# Patient Record
Sex: Male | Born: 1965 | Race: Black or African American | Marital: Married | Smoking: Current every day smoker
Health system: Southern US, Community
[De-identification: ages and names within clinical notes are randomized; demographics above are authoritative.]

## PROBLEM LIST (undated history)

## (undated) DIAGNOSIS — B20 Human immunodeficiency virus [HIV] disease: Secondary | ICD-10-CM

## (undated) DIAGNOSIS — J449 Chronic obstructive pulmonary disease, unspecified: Secondary | ICD-10-CM

## (undated) DIAGNOSIS — A630 Anogenital (venereal) warts: Secondary | ICD-10-CM

## (undated) DIAGNOSIS — M810 Age-related osteoporosis without current pathological fracture: Secondary | ICD-10-CM

---

## 2015-03-20 DIAGNOSIS — B20 Human immunodeficiency virus [HIV] disease: Secondary | ICD-10-CM | POA: Diagnosis not present

## 2015-04-11 DIAGNOSIS — B20 Human immunodeficiency virus [HIV] disease: Secondary | ICD-10-CM | POA: Diagnosis not present

## 2015-04-30 ENCOUNTER — Other Ambulatory Visit: Payer: Self-pay | Admitting: Infectious Disease

## 2015-05-18 ENCOUNTER — Other Ambulatory Visit: Payer: Self-pay | Admitting: Internal Medicine

## 2015-05-22 ENCOUNTER — Encounter: Payer: Self-pay | Admitting: Infectious Disease

## 2015-06-06 DIAGNOSIS — B20 Human immunodeficiency virus [HIV] disease: Secondary | ICD-10-CM | POA: Diagnosis not present

## 2015-07-30 DIAGNOSIS — B20 Human immunodeficiency virus [HIV] disease: Secondary | ICD-10-CM | POA: Diagnosis not present

## 2015-12-08 DIAGNOSIS — B20 Human immunodeficiency virus [HIV] disease: Secondary | ICD-10-CM

## 2016-03-02 ENCOUNTER — Other Ambulatory Visit: Payer: Self-pay | Admitting: Internal Medicine

## 2016-03-27 ENCOUNTER — Other Ambulatory Visit: Payer: Self-pay | Admitting: Infectious Disease

## 2016-04-30 ENCOUNTER — Other Ambulatory Visit: Payer: Self-pay | Admitting: Infectious Disease

## 2016-05-12 DIAGNOSIS — J9601 Acute respiratory failure with hypoxia: Secondary | ICD-10-CM

## 2016-05-12 DIAGNOSIS — J189 Pneumonia, unspecified organism: Secondary | ICD-10-CM

## 2016-05-12 DIAGNOSIS — A419 Sepsis, unspecified organism: Secondary | ICD-10-CM | POA: Diagnosis not present

## 2016-05-12 DIAGNOSIS — B20 Human immunodeficiency virus [HIV] disease: Secondary | ICD-10-CM

## 2016-05-12 DIAGNOSIS — N183 Chronic kidney disease, stage 3 (moderate): Secondary | ICD-10-CM | POA: Diagnosis not present

## 2016-05-13 DIAGNOSIS — J189 Pneumonia, unspecified organism: Secondary | ICD-10-CM | POA: Diagnosis not present

## 2016-05-13 DIAGNOSIS — A419 Sepsis, unspecified organism: Secondary | ICD-10-CM | POA: Diagnosis not present

## 2016-05-13 DIAGNOSIS — N183 Chronic kidney disease, stage 3 (moderate): Secondary | ICD-10-CM | POA: Diagnosis not present

## 2016-05-13 DIAGNOSIS — J9601 Acute respiratory failure with hypoxia: Secondary | ICD-10-CM | POA: Diagnosis not present

## 2016-05-13 DIAGNOSIS — N492 Inflammatory disorders of scrotum: Secondary | ICD-10-CM

## 2016-05-14 DIAGNOSIS — B20 Human immunodeficiency virus [HIV] disease: Secondary | ICD-10-CM

## 2016-05-14 DIAGNOSIS — A630 Anogenital (venereal) warts: Secondary | ICD-10-CM

## 2016-05-14 DIAGNOSIS — J449 Chronic obstructive pulmonary disease, unspecified: Secondary | ICD-10-CM

## 2016-05-14 DIAGNOSIS — J9601 Acute respiratory failure with hypoxia: Secondary | ICD-10-CM | POA: Diagnosis not present

## 2016-05-14 DIAGNOSIS — J189 Pneumonia, unspecified organism: Secondary | ICD-10-CM

## 2016-05-14 DIAGNOSIS — N492 Inflammatory disorders of scrotum: Secondary | ICD-10-CM

## 2016-05-15 DIAGNOSIS — A630 Anogenital (venereal) warts: Secondary | ICD-10-CM | POA: Diagnosis not present

## 2016-05-15 DIAGNOSIS — B20 Human immunodeficiency virus [HIV] disease: Secondary | ICD-10-CM | POA: Diagnosis not present

## 2016-05-15 DIAGNOSIS — G8929 Other chronic pain: Secondary | ICD-10-CM

## 2016-05-15 DIAGNOSIS — J9601 Acute respiratory failure with hypoxia: Secondary | ICD-10-CM | POA: Diagnosis not present

## 2016-05-15 DIAGNOSIS — J189 Pneumonia, unspecified organism: Secondary | ICD-10-CM | POA: Diagnosis not present

## 2016-09-01 ENCOUNTER — Telehealth: Payer: Self-pay | Admitting: Infectious Disease

## 2016-09-01 NOTE — Telephone Encounter (Signed)
Genotype faxed to Chrissy at [facility], confirmation received.  Lawrence MolaJacqueline Joscelynn Kane

## 2016-09-01 NOTE — Telephone Encounter (Signed)
Patients GENOSURE MG resistance testing back:       These results need to be sent to [facility] since he is our clinic patient there

## 2016-09-03 DIAGNOSIS — B20 Human immunodeficiency virus [HIV] disease: Secondary | ICD-10-CM | POA: Diagnosis not present

## 2016-11-06 ENCOUNTER — Emergency Department (HOSPITAL_COMMUNITY)
Admission: EM | Admit: 2016-11-06 | Discharge: 2016-11-06 | Disposition: A | Discharge status: Home / Self Care | Payer: Medicaid Other | Attending: Emergency Medicine | Admitting: Emergency Medicine

## 2016-11-06 ENCOUNTER — Encounter (HOSPITAL_COMMUNITY): Payer: Self-pay

## 2016-11-06 DIAGNOSIS — F17200 Nicotine dependence, unspecified, uncomplicated: Secondary | ICD-10-CM | POA: Insufficient documentation

## 2016-11-06 DIAGNOSIS — L03315 Cellulitis of perineum: Secondary | ICD-10-CM | POA: Diagnosis not present

## 2016-11-06 DIAGNOSIS — A630 Anogenital (venereal) warts: Secondary | ICD-10-CM

## 2016-11-06 DIAGNOSIS — L0292 Furuncle, unspecified: Secondary | ICD-10-CM | POA: Diagnosis present

## 2016-11-06 HISTORY — DX: Anogenital (venereal) warts: A63.0

## 2016-11-06 HISTORY — DX: Human immunodeficiency virus (HIV) disease: B20

## 2016-11-06 MED ORDER — SULFAMETHOXAZOLE-TRIMETHOPRIM 800-160 MG PO TABS: 1.0000 | ORAL_TABLET | Freq: Two times a day (BID) | ORAL | 0 refills | Status: AC

## 2016-11-06 MED ORDER — CEPHALEXIN 500 MG PO CAPS: 500.0000 mg | ORAL_CAPSULE | Freq: Four times a day (QID) | ORAL | 0 refills | Status: DC

## 2016-11-06 NOTE — ED Triage Notes (Signed)
Pt brought in [facility] for scrotal boil and warts rupture at testicles. Pt has hx of genital warts.

## 2016-11-06 NOTE — ED Provider Notes (Signed)
[facility]-EMERGENCY DEPT Provider Note   CSN: [id number] Arrival date & time: 11/06/16  1536     History   Chief Complaint Chief Complaint  Patient presents with  . Genital Warts    HPI Lawrence Kane is a 50 y.o. male.  HPI Patient presents with reported boils in his groin. States it is been there for the last month. States that they have started to bleed and rupture. States he's had them before. No fevers. States his joints are acting up. States he is pretty healthy review of some of the records shows that he has HIV and some questionable compliance. Has a history of genital warts. Denies fever. States he feels so bad he had to call the ambulance.   Past Medical History:  Diagnosis Date  . Genital warts   . HIV (human immunodeficiency virus infection) (HCC)     There are no active problems to display for this patient.   History reviewed. No pertinent surgical history.     Home Medications    Prior to Admission medications   Medication Sig Start Date End Date Taking? Authorizing Provider  cephALEXin (KEFLEX) 500 MG capsule Take 1 capsule (500 mg total) by mouth 4 (four) times daily. 11/06/16   Benjiman CoreNathan Amel Gianino, MD  sulfamethoxazole-trimethoprim (BACTRIM DS,SEPTRA DS) 800-160 MG tablet Take 1 tablet by mouth 2 (two) times daily. 11/06/16 11/13/16  Benjiman CoreNathan Sarath Privott, MD    Family History History reviewed. No pertinent family history.  Social History Social History  Substance Use Topics  . Smoking status: Current Every Day Smoker  . Smokeless tobacco: Never Used  . Alcohol use 0.6 oz/week    1 Cans of beer per week     Allergies   Iodine   Review of Systems Review of Systems  Constitutional: Negative for appetite change.  HENT: Negative for congestion.   Respiratory: Negative for shortness of breath.   Gastrointestinal: Negative for abdominal pain.  Genitourinary: Negative for flank pain.  Musculoskeletal: Positive for joint swelling.  Skin:       "Boils"   Neurological: Negative for headaches.  Hematological: Negative for adenopathy.     Physical Exam Updated Vital Signs BP 139/70 (BP Location: Right Arm)   Pulse 77   Temp 98.3 F (36.8 C) (Oral)   Resp 16   Ht 6\' 4"  (1.93 m)   Wt 240 lb (108.9 kg)   SpO2 99%   BMI 29.21 kg/m   Physical Exam  Constitutional: He appears well-developed.  HENT:  Head: Atraumatic.  Eyes:  Some discoloration right cornea and redness of his sclera.  Neck: Neck supple.  Cardiovascular: Normal rate.   Pulmonary/Chest: Effort normal.  Abdominal: Soft. There is no tenderness.  Genitourinary:  Genitourinary Comments: Patient has large amounts of confluent general warts on his scrotum and perineal area and up on his penis. There is likely an infection on without a clear abscess. Some purulence but not a clear draining site. No fluctuance.  Musculoskeletal: He exhibits no edema.  Skin: Skin is warm.     ED Treatments / Results  Labs (all labs ordered are listed, but only abnormal results are displayed) Labs Reviewed - No data to display  EKG  EKG Interpretation None       Radiology No results found.  Procedures Procedures (including critical care time)  Medications Ordered in ED Medications - No data to display   Initial Impression / Assessment and Plan / ED Course  I have reviewed the triage vital signs and  the nursing notes.  Pertinent labs & imaging results that were available during my care of the patient were reviewed by me and considered in my medical decision making (see chart for details).  Clinical Course   Patient with severe genital warts and likely infection on top of it. Has been on antibiotics the past. States that vancomycin is work. I think at this point he would benefit from admission however patient is not willing to stay. He states he does need some Dilaudid to help with his joints so. Patient was informed she will not get IV Dilaudid for joint pain. States he just  wants a PICC line now with vancomycin to go since he has to [place] to [place]. States he can come back on Tuesday. Patient was informed of the need for admission and the chance of him getting worse that he likely needs inpatient treatment. He was not willing to stay. He asked me for the PICC line asked me to be able to change his train take it. I informed him that I have no means to change a train ticket. He is not willing to be admitted and will leave AGAINST MEDICAL ADVICE. Final Clinical Impressions(s) / ED Diagnoses   Final diagnoses:  Anogenital warts  Cellulitis of perineum    New Prescriptions New Prescriptions   CEPHALEXIN (KEFLEX) 500 MG CAPSULE    Take 1 capsule (500 mg total) by mouth 4 (four) times daily.   SULFAMETHOXAZOLE-TRIMETHOPRIM (BACTRIM DS,SEPTRA DS) 800-160 MG TABLET    Take 1 tablet by mouth 2 (two) times daily.     Benjiman CoreNathan Tyee Vandevoorde, MD 11/06/16 [id number]

## 2016-11-06 NOTE — Discharge Instructions (Signed)
Exam of your prior to her leaving against our advice. I think you would benefit from admission to the hospital. Feel free to return for further treatment. Follow-up with your doctors her back in the ER as soon as possible.

## 2016-11-17 HISTORY — PX: PROSTATE SURGERY: SHX751

## 2016-11-28 ENCOUNTER — Encounter (HOSPITAL_COMMUNITY): Payer: Self-pay | Admitting: Emergency Medicine

## 2016-11-28 ENCOUNTER — Emergency Department (HOSPITAL_COMMUNITY)
Admission: EM | Admit: 2016-11-28 | Discharge: 2016-11-29 | Disposition: A | Discharge status: Home / Self Care | Payer: Medicaid Other | Attending: Emergency Medicine | Admitting: Emergency Medicine

## 2016-11-28 DIAGNOSIS — Y69 Unspecified misadventure during surgical and medical care: Secondary | ICD-10-CM | POA: Insufficient documentation

## 2016-11-28 DIAGNOSIS — F17200 Nicotine dependence, unspecified, uncomplicated: Secondary | ICD-10-CM | POA: Insufficient documentation

## 2016-11-28 DIAGNOSIS — T814XXA Infection following a procedure, initial encounter: Secondary | ICD-10-CM | POA: Diagnosis present

## 2016-11-28 DIAGNOSIS — J449 Chronic obstructive pulmonary disease, unspecified: Secondary | ICD-10-CM | POA: Diagnosis not present

## 2016-11-28 DIAGNOSIS — T8131XA Disruption of external operation (surgical) wound, not elsewhere classified, initial encounter: Secondary | ICD-10-CM | POA: Insufficient documentation

## 2016-11-28 DIAGNOSIS — T8130XA Disruption of wound, unspecified, initial encounter: Secondary | ICD-10-CM

## 2016-11-28 HISTORY — DX: Chronic obstructive pulmonary disease, unspecified: J44.9

## 2016-11-28 HISTORY — DX: Age-related osteoporosis without current pathological fracture: M81.0

## 2016-11-28 LAB — CBC WITH DIFFERENTIAL/PLATELET
BASOS ABS: 0 10*3/uL (ref 0.0–0.1)
Basophils Relative: 0 %
EOS PCT: 5 %
Eosinophils Absolute: 0.5 10*3/uL (ref 0.0–0.7)
HEMATOCRIT: 27.4 % — AB (ref 39.0–52.0)
HEMOGLOBIN: 9.1 g/dL — AB (ref 13.0–17.0)
LYMPHS PCT: 24 %
Lymphs Abs: 2.6 10*3/uL (ref 0.7–4.0)
MCH: 31.8 pg (ref 26.0–34.0)
MCHC: 33.2 g/dL (ref 30.0–36.0)
MCV: 95.8 fL (ref 78.0–100.0)
Monocytes Absolute: 0.9 10*3/uL (ref 0.1–1.0)
Monocytes Relative: 8 %
NEUTROS ABS: 7 10*3/uL (ref 1.7–7.7)
NEUTROS PCT: 63 %
PLATELETS: 260 10*3/uL (ref 150–400)
RBC: 2.86 MIL/uL — AB (ref 4.22–5.81)
RDW: 17.2 % — ABNORMAL HIGH (ref 11.5–15.5)
WBC: 10.9 10*3/uL — AB (ref 4.0–10.5)

## 2016-11-28 LAB — BASIC METABOLIC PANEL
ANION GAP: 6 (ref 5–15)
BUN: 41 mg/dL — ABNORMAL HIGH (ref 6–20)
CHLORIDE: 105 mmol/L (ref 101–111)
CO2: 24 mmol/L (ref 22–32)
Calcium: 8.7 mg/dL — ABNORMAL LOW (ref 8.9–10.3)
Creatinine, Ser: 1.74 mg/dL — ABNORMAL HIGH (ref 0.61–1.24)
GFR calc Af Amer: 51 mL/min — ABNORMAL LOW (ref >60)
GFR, EST NON AFRICAN AMERICAN: 44 mL/min — AB (ref >60)
GLUCOSE: 250 mg/dL — AB (ref 65–99)
POTASSIUM: 4.6 mmol/L (ref 3.5–5.1)
Sodium: 135 mmol/L (ref 135–145)

## 2016-11-28 MED ORDER — DOXYCYCLINE HYCLATE 100 MG PO CAPS: 100.0000 mg | ORAL_CAPSULE | Freq: Two times a day (BID) | ORAL | 0 refills | Status: DC

## 2016-11-28 MED ORDER — OXYCODONE-ACETAMINOPHEN 5-325 MG PO TABS
1.0000 | ORAL_TABLET | Freq: Once | ORAL | Status: AC
  Administered 2016-11-28: 1 via ORAL
  Filled 2016-11-28: qty 1

## 2016-11-28 NOTE — ED Notes (Signed)
Bed: WA02 Expected date:  Expected time:  Means of arrival:  Comments: Ems- post op bleeding

## 2016-11-28 NOTE — ED Triage Notes (Signed)
Per EMS pt had procedure on his prostate on the 10th. Pt received 4 units of blood on the 13th due to post-op bleeding complications. Pt is not on blood thinners. Pt has Hx of HIV, Hep B and Hep C.

## 2016-11-28 NOTE — ED Provider Notes (Signed)
[facility]-EMERGENCY DEPT Provider Note   CSN: [id number] Arrival date & time: 11/28/16  2022     History   Chief Complaint Chief Complaint  Patient presents with  . Post-op Problem    HPI Lawrence Kane is a 51 y.o. male.  HPI Patient presented to the emergency room on December 30 with complaints of general warts and cutaneous infections in the scrotal region.  Admission to the hospital was recommended. Patient opted to leave. Patient states he then traveled outside the state. He ended up getting care at another facility. He had a surgical procedure in the scrotal region. Patient left the hospital and came back to [place]. I'm not sure if he was discharged or left on his own. He comes to the emergency room now because he has having continued drainage from the wound. He also noticed some blood.  He denies any trouble with any fevers or chills. No vomiting or diarrhea. Past Medical History:  Diagnosis Date  . COPD (chronic obstructive pulmonary disease) (HCC)   . Genital warts   . HIV (human immunodeficiency virus infection) (HCC)   . Osteoporosis     There are no active problems to display for this patient.   Past Surgical History:  Procedure Laterality Date  . PROSTATE SURGERY  11/17/2016       Home Medications    Prior to Admission medications   Medication Sig Start Date End Date Taking? Authorizing Provider  azithromycin (ZITHROMAX) 600 MG tablet Take 600 mg by mouth 3 (three) times a week. 10/28/16  Yes Historical Provider, MD  clindamycin (CLEOCIN) 150 MG capsule Take 300 mg by mouth 4 (four) times daily.   Yes Historical Provider, MD  diphenhydrAMINE (SOMINEX) 25 MG tablet Take 25 mg by mouth every 6 (six) hours as needed for itching.   Yes Historical Provider, MD  fluconazole (DIFLUCAN) 100 MG tablet Take 100 mg by mouth daily.   Yes Historical Provider, MD  gabapentin (NEURONTIN) 600 MG tablet Take 600 mg by mouth 3 (three) times daily.   Yes Historical Provider,  MD  GENVOYA 150-150-200-10 MG TABS tablet Take 1 tablet by mouth daily. 10/27/16  Yes Historical Provider, MD  ipratropium-albuterol (DUONEB) 0.5-2.5 (3) MG/3ML SOLN Take 3 mLs by nebulization every 4 (four) hours as needed (shortness of breathe).   Yes Historical Provider, MD  oxyCODONE-acetaminophen (PERCOCET) 10-325 MG tablet Take 1 tablet by mouth every 6 (six) hours as needed for pain. 11/19/16  Yes Historical Provider, MD  polyvinyl alcohol (LIQUIFILM TEARS) 1.4 % ophthalmic solution Place 1 drop into both eyes daily as needed for dry eyes.   Yes Historical Provider, MD  PREZISTA 800 MG tablet Take 800 mg by mouth daily. 10/29/16  Yes Historical Provider, MD  TRUVADA 200-300 MG tablet Take 1 tablet by mouth daily. 10/26/16  Yes Historical Provider, MD  doxycycline (VIBRAMYCIN) 100 MG capsule Take 1 capsule (100 mg total) by mouth 2 (two) times daily. 11/28/16   Linwood DibblesJon Dania Marsan, MD    Family History No family history on file.  Social History Social History  Substance Use Topics  . Smoking status: Current Every Day Smoker  . Smokeless tobacco: Never Used  . Alcohol use 0.6 oz/week    1 Cans of beer per week     Allergies   Iodine; Ketorolac; and Tylenol [acetaminophen]   Review of Systems Review of Systems  All other systems reviewed and are negative.    Physical Exam Updated Vital Signs BP 132/84 (BP Location: Right  Arm)   Pulse 82   Temp 97.7 F (36.5 C) (Oral)   Resp 18   Ht 6\' 4"  (1.93 m)   Wt 111.1 kg   SpO2 98%   BMI 29.82 kg/m   Physical Exam  Constitutional: He appears well-developed and well-nourished. No distress.  HENT:  Head: Normocephalic and atraumatic.  Right Ear: External ear normal.  Left Ear: External ear normal.  Eyes: Conjunctivae are normal. Right eye exhibits no discharge. Left eye exhibits no discharge. No scleral icterus.  Neck: Neck supple. No tracheal deviation present.  Cardiovascular: Normal rate.   Pulmonary/Chest: Effort normal. No  stridor. No respiratory distress.  Abdominal: He exhibits no distension.  Genitourinary:  Genitourinary Comments: Patient has evidence of genital warts on the penis and the scrotum, he has a large dehisced wound at the base of the scrotum, there is purulent malodorous drainage, the tissue at the base of the wound however is pink there is no active bleeding, the patient has what appears to be a Penrose drain that is still sutured but does not appear to be draining any wound and is external on the skin surface  Musculoskeletal: He exhibits no edema.  Neurological: He is alert. Cranial nerve deficit: no gross deficits.  Skin: Skin is warm and dry. No rash noted.  Psychiatric: He has a normal mood and affect.  Nursing note and vitals reviewed.      ED Treatments / Results  Labs (all labs ordered are listed, but only abnormal results are displayed) Labs Reviewed  CBC WITH DIFFERENTIAL/PLATELET - Abnormal; Notable for the following:       Result Value   WBC 10.9 (*)    RBC 2.86 (*)    Hemoglobin 9.1 (*)    HCT 27.4 (*)    RDW 17.2 (*)    All other components within normal limits  BASIC METABOLIC PANEL - Abnormal; Notable for the following:    Glucose, Bld 250 (*)    BUN 41 (*)    Creatinine, Ser 1.74 (*)    Calcium 8.7 (*)    GFR calc non Af Amer 44 (*)    GFR calc Af Amer 51 (*)    All other components within normal limits     Procedures Procedures (including critical care time) Penrose drain removed.    Medications Ordered in ED Medications  oxyCODONE-acetaminophen (PERCOCET/ROXICET) 5-325 MG per tablet 1 tablet (1 tablet Oral Given 11/28/16 2237)     Initial Impression / Assessment and Plan / ED Course  I have reviewed the triage vital signs and the nursing notes.  Pertinent labs & imaging results that were available during my care of the patient were reviewed by me and considered in my medical decision making (see chart for details).   I review the patient's medical  records. He clearly has history of noncompliance and limited understanding of his medical condition. I suspect that he had this surgical procedure in the other state and then left before he completed his care. Appears to have a large wound dehiscence right now but I do not seen any vascular tissue. I am not sure if he had an incision and drainage versus excision of condyloma versus a combination of both. He is not febrile.  I do not think he has been adequately taking care of this wound regarding hygiene.  Wound was irrigated and cleaned in the ED.   Will dc home on doxycycline.   Discussed sterile dressing and gauze  I discussed the  case with Dr Domenick Bookbinder.  Will arrange close follow up in the urology office.     Final Clinical Impressions(s) / ED Diagnoses   Final diagnoses:  Wound dehiscence    New Prescriptions New Prescriptions   DOXYCYCLINE (VIBRAMYCIN) 100 MG CAPSULE    Take 1 capsule (100 mg total) by mouth 2 (two) times daily.     Linwood Dibbles, MD 11/28/16 [id number]

## 2016-11-28 NOTE — Discharge Instructions (Signed)
Soak in a tub daily, apply sterile gauze to the wound, follow up in the urology office

## 2016-12-01 ENCOUNTER — Inpatient Hospital Stay (HOSPITAL_COMMUNITY)
Admission: AD | Admit: 2016-12-01 | Discharge: 2017-01-04 | DRG: 004 | Disposition: A | Discharge status: Skilled Nursing Facility | Payer: Medicaid Other | Source: Other Acute Inpatient Hospital | Attending: Nephrology | Admitting: Nephrology

## 2016-12-01 ENCOUNTER — Inpatient Hospital Stay (HOSPITAL_COMMUNITY): Payer: Medicaid Other

## 2016-12-01 DIAGNOSIS — E872 Acidosis: Secondary | ICD-10-CM | POA: Diagnosis present

## 2016-12-01 DIAGNOSIS — D721 Eosinophilia: Secondary | ICD-10-CM | POA: Diagnosis present

## 2016-12-01 DIAGNOSIS — Z91041 Radiographic dye allergy status: Secondary | ICD-10-CM

## 2016-12-01 DIAGNOSIS — R451 Restlessness and agitation: Secondary | ICD-10-CM | POA: Diagnosis not present

## 2016-12-01 DIAGNOSIS — G9340 Encephalopathy, unspecified: Secondary | ICD-10-CM | POA: Diagnosis not present

## 2016-12-01 DIAGNOSIS — R4182 Altered mental status, unspecified: Secondary | ICD-10-CM | POA: Diagnosis present

## 2016-12-01 DIAGNOSIS — R1310 Dysphagia, unspecified: Secondary | ICD-10-CM | POA: Diagnosis not present

## 2016-12-01 DIAGNOSIS — M810 Age-related osteoporosis without current pathological fracture: Secondary | ICD-10-CM | POA: Diagnosis present

## 2016-12-01 DIAGNOSIS — G9341 Metabolic encephalopathy: Secondary | ICD-10-CM | POA: Diagnosis present

## 2016-12-01 DIAGNOSIS — Z1612 Extended spectrum beta lactamase (ESBL) resistance: Secondary | ICD-10-CM | POA: Diagnosis not present

## 2016-12-01 DIAGNOSIS — G8250 Quadriplegia, unspecified: Secondary | ICD-10-CM | POA: Diagnosis not present

## 2016-12-01 DIAGNOSIS — Z79891 Long term (current) use of opiate analgesic: Secondary | ICD-10-CM | POA: Diagnosis not present

## 2016-12-01 DIAGNOSIS — M5410 Radiculopathy, site unspecified: Secondary | ICD-10-CM | POA: Diagnosis not present

## 2016-12-01 DIAGNOSIS — R740 Nonspecific elevation of levels of transaminase and lactic acid dehydrogenase [LDH]: Secondary | ICD-10-CM | POA: Diagnosis not present

## 2016-12-01 DIAGNOSIS — F1410 Cocaine abuse, uncomplicated: Secondary | ICD-10-CM

## 2016-12-01 DIAGNOSIS — G0490 Encephalitis and encephalomyelitis, unspecified: Secondary | ICD-10-CM

## 2016-12-01 DIAGNOSIS — J449 Chronic obstructive pulmonary disease, unspecified: Secondary | ICD-10-CM | POA: Diagnosis not present

## 2016-12-01 DIAGNOSIS — Z794 Long term (current) use of insulin: Secondary | ICD-10-CM | POA: Diagnosis not present

## 2016-12-01 DIAGNOSIS — Z7189 Other specified counseling: Secondary | ICD-10-CM | POA: Diagnosis not present

## 2016-12-01 DIAGNOSIS — B181 Chronic viral hepatitis B without delta-agent: Secondary | ICD-10-CM | POA: Diagnosis present

## 2016-12-01 DIAGNOSIS — J810 Acute pulmonary edema: Secondary | ICD-10-CM

## 2016-12-01 DIAGNOSIS — E870 Hyperosmolality and hypernatremia: Secondary | ICD-10-CM | POA: Diagnosis present

## 2016-12-01 DIAGNOSIS — R7881 Bacteremia: Secondary | ICD-10-CM | POA: Diagnosis not present

## 2016-12-01 DIAGNOSIS — I272 Pulmonary hypertension, unspecified: Secondary | ICD-10-CM | POA: Diagnosis present

## 2016-12-01 DIAGNOSIS — Y95 Nosocomial condition: Secondary | ICD-10-CM | POA: Diagnosis not present

## 2016-12-01 DIAGNOSIS — J150 Pneumonia due to Klebsiella pneumoniae: Secondary | ICD-10-CM | POA: Diagnosis present

## 2016-12-01 DIAGNOSIS — E43 Unspecified severe protein-calorie malnutrition: Secondary | ICD-10-CM | POA: Diagnosis not present

## 2016-12-01 DIAGNOSIS — J811 Chronic pulmonary edema: Secondary | ICD-10-CM

## 2016-12-01 DIAGNOSIS — Z79899 Other long term (current) drug therapy: Secondary | ICD-10-CM

## 2016-12-01 DIAGNOSIS — R066 Hiccough: Secondary | ICD-10-CM | POA: Diagnosis not present

## 2016-12-01 DIAGNOSIS — I6340 Cerebral infarction due to embolism of unspecified cerebral artery: Secondary | ICD-10-CM | POA: Insufficient documentation

## 2016-12-01 DIAGNOSIS — A6000 Herpesviral infection of urogenital system, unspecified: Secondary | ICD-10-CM | POA: Diagnosis present

## 2016-12-01 DIAGNOSIS — A419 Sepsis, unspecified organism: Principal | ICD-10-CM

## 2016-12-01 DIAGNOSIS — B004 Herpesviral encephalitis: Secondary | ICD-10-CM | POA: Diagnosis present

## 2016-12-01 DIAGNOSIS — A630 Anogenital (venereal) warts: Secondary | ICD-10-CM

## 2016-12-01 DIAGNOSIS — M6281 Muscle weakness (generalized): Secondary | ICD-10-CM | POA: Diagnosis not present

## 2016-12-01 DIAGNOSIS — K759 Inflammatory liver disease, unspecified: Secondary | ICD-10-CM | POA: Diagnosis not present

## 2016-12-01 DIAGNOSIS — I638 Other cerebral infarction: Secondary | ICD-10-CM | POA: Diagnosis not present

## 2016-12-01 DIAGNOSIS — R6521 Severe sepsis with septic shock: Secondary | ICD-10-CM | POA: Diagnosis present

## 2016-12-01 DIAGNOSIS — E785 Hyperlipidemia, unspecified: Secondary | ICD-10-CM | POA: Diagnosis present

## 2016-12-01 DIAGNOSIS — Z978 Presence of other specified devices: Secondary | ICD-10-CM | POA: Diagnosis not present

## 2016-12-01 DIAGNOSIS — R1312 Dysphagia, oropharyngeal phase: Secondary | ICD-10-CM | POA: Diagnosis present

## 2016-12-01 DIAGNOSIS — D62 Acute posthemorrhagic anemia: Secondary | ICD-10-CM | POA: Diagnosis present

## 2016-12-01 DIAGNOSIS — J440 Chronic obstructive pulmonary disease with acute lower respiratory infection: Secondary | ICD-10-CM | POA: Diagnosis present

## 2016-12-01 DIAGNOSIS — J9601 Acute respiratory failure with hypoxia: Secondary | ICD-10-CM

## 2016-12-01 DIAGNOSIS — R0902 Hypoxemia: Secondary | ICD-10-CM

## 2016-12-01 DIAGNOSIS — R059 Cough, unspecified: Secondary | ICD-10-CM

## 2016-12-01 DIAGNOSIS — G8220 Paraplegia, unspecified: Secondary | ICD-10-CM

## 2016-12-01 DIAGNOSIS — Z6824 Body mass index (BMI) 24.0-24.9, adult: Secondary | ICD-10-CM

## 2016-12-01 DIAGNOSIS — F1910 Other psychoactive substance abuse, uncomplicated: Secondary | ICD-10-CM | POA: Diagnosis not present

## 2016-12-01 DIAGNOSIS — I776 Arteritis, unspecified: Secondary | ICD-10-CM | POA: Diagnosis not present

## 2016-12-01 DIAGNOSIS — R140 Abdominal distension (gaseous): Secondary | ICD-10-CM | POA: Diagnosis not present

## 2016-12-01 DIAGNOSIS — Z765 Malingerer [conscious simulation]: Secondary | ICD-10-CM

## 2016-12-01 DIAGNOSIS — I4510 Unspecified right bundle-branch block: Secondary | ICD-10-CM | POA: Diagnosis present

## 2016-12-01 DIAGNOSIS — Z930 Tracheostomy status: Secondary | ICD-10-CM | POA: Diagnosis not present

## 2016-12-01 DIAGNOSIS — I160 Hypertensive urgency: Secondary | ICD-10-CM

## 2016-12-01 DIAGNOSIS — D72829 Elevated white blood cell count, unspecified: Secondary | ICD-10-CM

## 2016-12-01 DIAGNOSIS — R52 Pain, unspecified: Secondary | ICD-10-CM | POA: Diagnosis not present

## 2016-12-01 DIAGNOSIS — B20 Human immunodeficiency virus [HIV] disease: Secondary | ICD-10-CM | POA: Diagnosis present

## 2016-12-01 DIAGNOSIS — B9562 Methicillin resistant Staphylococcus aureus infection as the cause of diseases classified elsewhere: Secondary | ICD-10-CM | POA: Diagnosis present

## 2016-12-01 DIAGNOSIS — Z4659 Encounter for fitting and adjustment of other gastrointestinal appliance and device: Secondary | ICD-10-CM

## 2016-12-01 DIAGNOSIS — Z781 Physical restraint status: Secondary | ICD-10-CM

## 2016-12-01 DIAGNOSIS — K567 Ileus, unspecified: Secondary | ICD-10-CM

## 2016-12-01 DIAGNOSIS — S3130XA Unspecified open wound of scrotum and testes, initial encounter: Secondary | ICD-10-CM | POA: Diagnosis present

## 2016-12-01 DIAGNOSIS — Z66 Do not resuscitate: Secondary | ICD-10-CM | POA: Diagnosis present

## 2016-12-01 DIAGNOSIS — R400 Somnolence: Secondary | ICD-10-CM

## 2016-12-01 DIAGNOSIS — N5089 Other specified disorders of the male genital organs: Secondary | ICD-10-CM | POA: Diagnosis not present

## 2016-12-01 DIAGNOSIS — F17200 Nicotine dependence, unspecified, uncomplicated: Secondary | ICD-10-CM | POA: Diagnosis present

## 2016-12-01 DIAGNOSIS — B1920 Unspecified viral hepatitis C without hepatic coma: Secondary | ICD-10-CM | POA: Diagnosis present

## 2016-12-01 DIAGNOSIS — Z888 Allergy status to other drugs, medicaments and biological substances status: Secondary | ICD-10-CM | POA: Diagnosis not present

## 2016-12-01 DIAGNOSIS — Z515 Encounter for palliative care: Secondary | ICD-10-CM | POA: Diagnosis not present

## 2016-12-01 DIAGNOSIS — R4020 Unspecified coma: Secondary | ICD-10-CM | POA: Diagnosis not present

## 2016-12-01 DIAGNOSIS — K56609 Unspecified intestinal obstruction, unspecified as to partial versus complete obstruction: Secondary | ICD-10-CM

## 2016-12-01 DIAGNOSIS — R0603 Acute respiratory distress: Secondary | ICD-10-CM

## 2016-12-01 DIAGNOSIS — R1314 Dysphagia, pharyngoesophageal phase: Secondary | ICD-10-CM | POA: Diagnosis not present

## 2016-12-01 DIAGNOSIS — X58XXXA Exposure to other specified factors, initial encounter: Secondary | ICD-10-CM | POA: Diagnosis not present

## 2016-12-01 DIAGNOSIS — T380X5A Adverse effect of glucocorticoids and synthetic analogues, initial encounter: Secondary | ICD-10-CM | POA: Diagnosis not present

## 2016-12-01 DIAGNOSIS — B003 Herpesviral meningitis: Secondary | ICD-10-CM

## 2016-12-01 DIAGNOSIS — J9690 Respiratory failure, unspecified, unspecified whether with hypoxia or hypercapnia: Secondary | ICD-10-CM

## 2016-12-01 DIAGNOSIS — G0491 Myelitis, unspecified: Secondary | ICD-10-CM | POA: Diagnosis not present

## 2016-12-01 DIAGNOSIS — J9600 Acute respiratory failure, unspecified whether with hypoxia or hypercapnia: Secondary | ICD-10-CM | POA: Diagnosis not present

## 2016-12-01 DIAGNOSIS — Z91199 Patient's noncompliance with other medical treatment and regimen due to unspecified reason: Secondary | ICD-10-CM

## 2016-12-01 DIAGNOSIS — F4323 Adjustment disorder with mixed anxiety and depressed mood: Secondary | ICD-10-CM | POA: Diagnosis not present

## 2016-12-01 DIAGNOSIS — I6789 Other cerebrovascular disease: Secondary | ICD-10-CM | POA: Diagnosis not present

## 2016-12-01 DIAGNOSIS — R739 Hyperglycemia, unspecified: Secondary | ICD-10-CM | POA: Diagnosis not present

## 2016-12-01 DIAGNOSIS — B009 Herpesviral infection, unspecified: Secondary | ICD-10-CM | POA: Diagnosis not present

## 2016-12-01 DIAGNOSIS — I639 Cerebral infarction, unspecified: Secondary | ICD-10-CM | POA: Diagnosis not present

## 2016-12-01 DIAGNOSIS — G839 Paralytic syndrome, unspecified: Secondary | ICD-10-CM | POA: Diagnosis not present

## 2016-12-01 DIAGNOSIS — G373 Acute transverse myelitis in demyelinating disease of central nervous system: Secondary | ICD-10-CM | POA: Diagnosis present

## 2016-12-01 DIAGNOSIS — F609 Personality disorder, unspecified: Secondary | ICD-10-CM | POA: Diagnosis present

## 2016-12-01 DIAGNOSIS — Z960 Presence of urogenital implants: Secondary | ICD-10-CM | POA: Diagnosis not present

## 2016-12-01 DIAGNOSIS — E11649 Type 2 diabetes mellitus with hypoglycemia without coma: Secondary | ICD-10-CM | POA: Diagnosis not present

## 2016-12-01 DIAGNOSIS — R471 Dysarthria and anarthria: Secondary | ICD-10-CM | POA: Diagnosis not present

## 2016-12-01 DIAGNOSIS — Z9911 Dependence on respirator [ventilator] status: Secondary | ICD-10-CM | POA: Diagnosis not present

## 2016-12-01 DIAGNOSIS — R7401 Elevation of levels of liver transaminase levels: Secondary | ICD-10-CM

## 2016-12-01 DIAGNOSIS — Z0189 Encounter for other specified special examinations: Secondary | ICD-10-CM

## 2016-12-01 DIAGNOSIS — Z9119 Patient's noncompliance with other medical treatment and regimen: Secondary | ICD-10-CM | POA: Diagnosis not present

## 2016-12-01 DIAGNOSIS — F1420 Cocaine dependence, uncomplicated: Secondary | ICD-10-CM | POA: Diagnosis not present

## 2016-12-01 DIAGNOSIS — Z9889 Other specified postprocedural states: Secondary | ICD-10-CM | POA: Diagnosis not present

## 2016-12-01 DIAGNOSIS — T148XXA Other injury of unspecified body region, initial encounter: Secondary | ICD-10-CM

## 2016-12-01 DIAGNOSIS — R05 Cough: Secondary | ICD-10-CM

## 2016-12-01 DIAGNOSIS — G039 Meningitis, unspecified: Secondary | ICD-10-CM | POA: Diagnosis not present

## 2016-12-01 DIAGNOSIS — G009 Bacterial meningitis, unspecified: Secondary | ICD-10-CM | POA: Diagnosis not present

## 2016-12-01 DIAGNOSIS — J432 Centrilobular emphysema: Secondary | ICD-10-CM | POA: Diagnosis not present

## 2016-12-01 DIAGNOSIS — A523 Neurosyphilis, unspecified: Secondary | ICD-10-CM

## 2016-12-01 DIAGNOSIS — Z21 Asymptomatic human immunodeficiency virus [HIV] infection status: Secondary | ICD-10-CM | POA: Diagnosis not present

## 2016-12-01 DIAGNOSIS — B1910 Unspecified viral hepatitis B without hepatic coma: Secondary | ICD-10-CM | POA: Diagnosis not present

## 2016-12-01 DIAGNOSIS — F17210 Nicotine dependence, cigarettes, uncomplicated: Secondary | ICD-10-CM | POA: Diagnosis not present

## 2016-12-01 DIAGNOSIS — I10 Essential (primary) hypertension: Secondary | ICD-10-CM | POA: Diagnosis present

## 2016-12-01 DIAGNOSIS — E119 Type 2 diabetes mellitus without complications: Secondary | ICD-10-CM | POA: Diagnosis not present

## 2016-12-01 DIAGNOSIS — Z886 Allergy status to analgesic agent status: Secondary | ICD-10-CM | POA: Diagnosis not present

## 2016-12-01 DIAGNOSIS — E1165 Type 2 diabetes mellitus with hyperglycemia: Secondary | ICD-10-CM | POA: Diagnosis present

## 2016-12-01 DIAGNOSIS — E8770 Fluid overload, unspecified: Secondary | ICD-10-CM | POA: Diagnosis present

## 2016-12-01 LAB — GLUCOSE, CAPILLARY
GLUCOSE-CAPILLARY: 302 mg/dL — AB (ref 65–99)
Glucose-Capillary: 296 mg/dL — ABNORMAL HIGH (ref 65–99)

## 2016-12-01 MED ORDER — DEXAMETHASONE SODIUM PHOSPHATE 10 MG/ML IJ SOLN
15.0000 mg | Freq: Four times a day (QID) | INTRAMUSCULAR | Status: DC
  Administered 2016-12-02 – 2016-12-03 (×6): 15 mg via INTRAVENOUS
  Filled 2016-12-01 (×9): qty 1.5

## 2016-12-01 MED ORDER — DOCUSATE SODIUM 50 MG/5ML PO LIQD
100.0000 mg | Freq: Two times a day (BID) | ORAL | Status: DC | PRN
  Administered 2016-12-04 – 2016-12-07 (×4): 100 mg
  Filled 2016-12-01 (×5): qty 10

## 2016-12-01 MED ORDER — SODIUM CHLORIDE 0.9 % IV SOLN
25.0000 ug/h | INTRAVENOUS | Status: DC
  Administered 2016-12-02: 50 ug/h via INTRAVENOUS
  Administered 2016-12-03: 200 ug/h via INTRAVENOUS
  Administered 2016-12-03: 400 ug/h via INTRAVENOUS
  Administered 2016-12-04: 325 ug/h via INTRAVENOUS
  Administered 2016-12-04 (×2): 400 ug/h via INTRAVENOUS
  Administered 2016-12-05 (×2): 300 ug/h via INTRAVENOUS
  Administered 2016-12-05: 325 ug/h via INTRAVENOUS
  Administered 2016-12-06: 200 ug/h via INTRAVENOUS
  Administered 2016-12-06: 100 ug/h via INTRAVENOUS
  Administered 2016-12-06 – 2016-12-07 (×2): 300 ug/h via INTRAVENOUS
  Administered 2016-12-08: 150 ug/h via INTRAVENOUS
  Administered 2016-12-08 (×2): 400 ug/h via INTRAVENOUS
  Administered 2016-12-09: 150 ug/h via INTRAVENOUS
  Administered 2016-12-10 – 2016-12-11 (×2): 250 ug/h via INTRAVENOUS
  Administered 2016-12-11: 300 ug/h via INTRAVENOUS
  Administered 2016-12-12: 200 ug/h via INTRAVENOUS
  Administered 2016-12-12: 250 ug/h via INTRAVENOUS
  Administered 2016-12-13: 200 ug/h via INTRAVENOUS
  Filled 2016-12-01 (×32): qty 50

## 2016-12-01 MED ORDER — HEPARIN SODIUM (PORCINE) 5000 UNIT/ML IJ SOLN
5000.0000 [IU] | Freq: Three times a day (TID) | INTRAMUSCULAR | Status: DC
  Administered 2016-12-02 – 2017-01-03 (×96): 5000 [IU] via SUBCUTANEOUS
  Filled 2016-12-01 (×101): qty 1

## 2016-12-01 MED ORDER — HYDRALAZINE HCL 20 MG/ML IJ SOLN
10.0000 mg | Freq: Four times a day (QID) | INTRAMUSCULAR | Status: DC | PRN
Start: 2016-12-01 — End: 2016-12-03
  Administered 2016-12-02: 10 mg via INTRAVENOUS
  Filled 2016-12-01: qty 1

## 2016-12-01 MED ORDER — DOLUTEGRAVIR SODIUM 50 MG PO TABS
50.0000 mg | ORAL_TABLET | Freq: Every day | ORAL | Status: DC
  Administered 2016-12-02 – 2017-01-04 (×30): 50 mg via ORAL
  Filled 2016-12-01 (×31): qty 1

## 2016-12-01 MED ORDER — INSULIN ASPART 100 UNIT/ML ~~LOC~~ SOLN: 0.0000 [IU] | SUBCUTANEOUS | Status: DC

## 2016-12-01 MED ORDER — ORAL CARE MOUTH RINSE: 15.0000 mL | OROMUCOSAL | Status: AC

## 2016-12-01 MED ORDER — PROPOFOL 1000 MG/100ML IV EMUL
0.0000 ug/kg/min | INTRAVENOUS | Status: DC
  Administered 2016-12-01: 60 ug/kg/min via INTRAVENOUS
  Administered 2016-12-02 (×3): 50 ug/kg/min via INTRAVENOUS
  Filled 2016-12-01 (×3): qty 100

## 2016-12-01 MED ORDER — ORAL CARE MOUTH RINSE
15.0000 mL | OROMUCOSAL | Status: DC
  Administered 2016-12-02 – 2016-12-14 (×125): 15 mL via OROMUCOSAL

## 2016-12-01 MED ORDER — FENTANYL CITRATE (PF) 100 MCG/2ML IJ SOLN: 50.0000 ug | Freq: Once | INTRAMUSCULAR | Status: DC

## 2016-12-01 MED ORDER — SODIUM CHLORIDE 0.9 % IV SOLN
2.0000 g | INTRAVENOUS | Status: DC
  Administered 2016-12-02 – 2016-12-04 (×15): 2 g via INTRAVENOUS
  Filled 2016-12-01 (×17): qty 2000

## 2016-12-01 MED ORDER — SODIUM CHLORIDE 0.9 % IV SOLN
250.0000 mL | INTRAVENOUS | Status: DC | PRN
  Administered 2016-12-02 – 2016-12-25 (×4): 250 mL via INTRAVENOUS

## 2016-12-01 MED ORDER — FAMOTIDINE IN NACL 20-0.9 MG/50ML-% IV SOLN
20.0000 mg | Freq: Two times a day (BID) | INTRAVENOUS | Status: DC
  Administered 2016-12-02 (×3): 20 mg via INTRAVENOUS
  Filled 2016-12-01 (×4): qty 50

## 2016-12-01 MED ORDER — INSULIN ASPART 100 UNIT/ML ~~LOC~~ SOLN
0.0000 [IU] | SUBCUTANEOUS | Status: DC
  Administered 2016-12-01: 15 [IU] via SUBCUTANEOUS
  Administered 2016-12-02: 7 [IU] via SUBCUTANEOUS
  Administered 2016-12-02: 15 [IU] via SUBCUTANEOUS
  Administered 2016-12-02: 11 [IU] via SUBCUTANEOUS

## 2016-12-01 MED ORDER — SODIUM CHLORIDE 0.9 % IV BOLUS (SEPSIS)
1000.0000 mL | Freq: Once | INTRAVENOUS | Status: AC
  Administered 2016-12-02: 1000 mL via INTRAVENOUS

## 2016-12-01 MED ORDER — ACETAMINOPHEN 325 MG PO TABS
650.0000 mg | ORAL_TABLET | ORAL | Status: DC | PRN
  Administered 2016-12-08 – 2017-01-03 (×3): 650 mg via ORAL
  Filled 2016-12-01 (×3): qty 2

## 2016-12-01 MED ORDER — CHLORHEXIDINE GLUCONATE 0.12% ORAL RINSE (MEDLINE KIT): 15.0000 mL | Freq: Two times a day (BID) | OROMUCOSAL | Status: AC

## 2016-12-01 MED ORDER — SODIUM CHLORIDE 0.9 % IV SOLN
INTRAVENOUS | Status: DC
  Administered 2016-12-02 – 2016-12-03 (×3): via INTRAVENOUS

## 2016-12-01 MED ORDER — FENTANYL BOLUS VIA INFUSION
50.0000 ug | INTRAVENOUS | Status: DC | PRN
  Administered 2016-12-03 – 2016-12-13 (×13): 50 ug via INTRAVENOUS
  Filled 2016-12-01: qty 50

## 2016-12-01 MED ORDER — EMTRICITABINE-TENOFOVIR AF 200-25 MG PO TABS
1.0000 | ORAL_TABLET | Freq: Every day | ORAL | Status: DC
  Administered 2016-12-02 – 2016-12-27 (×22): 1
  Filled 2016-12-01 (×26): qty 1

## 2016-12-01 MED ORDER — CHLORHEXIDINE GLUCONATE 0.12% ORAL RINSE (MEDLINE KIT)
15.0000 mL | Freq: Two times a day (BID) | OROMUCOSAL | Status: DC
  Administered 2016-12-01 – 2016-12-14 (×27): 15 mL via OROMUCOSAL

## 2016-12-01 NOTE — Progress Notes (Addendum)
Pharmacy Antibiotic Note  Lawrence Kane is a 51 y.o. male admitted to [facility] on 12/01/2016 with c/o AMS. Required intubation and [facility] pm for ICU care. Pharmacy has been consulted for Vancomycin, Rocephin, Acyclovir dosing for bacterial meningitis, HSV encephalitis, possible LLL PNA, and open wound of scrotum. Also consulted for Bactrim for PCP prophylaxis. Pt also on Ampicillin and IV dexamethasone. Noted pt with h/o HIV - continuing home Truvada and Tivicay.  SCr was 1.1 earlier today at [facility]. Also noted that [facility] from [facility] shows gluc < 20 and protein > 300.  Pt received Vanc 2gm ~1000, Rocephin 2gm ~1600 at [facility]. Bactrim was ordered by not given.  Plan: Rocephin 2gm IV q12h. Next dose at 0400. Vancomycin 1000mg  IV q8h. Acyclovir 1100mg  IV q8h (10mg /kg) Bactrim 160mg  per tube M/W/F for PCP prophylaxis F/u ID consult Will f/u micro data, renal function, and pt's clinical condition Vanc trough prn  Addendum (0630): Pt wt updated to 93 kg  Change acyclovir to 930mg  IV q8h (10mg /kg)   Height: 6\' 4"  (193 cm) IBW/kg (Calculated) : 86.8  No data recorded.   Recent Labs Lab 11/28/16 2205  WBC 10.9*  CREATININE 1.74*    Estimated Creatinine Clearance: 69.3 mL/min (by C-G formula based on SCr of 1.74 mg/dL (H)).    Allergies  Allergen Reactions  . Iodine   . Ketorolac     unknown  . Tylenol [Acetaminophen]     unknown    Antimicrobials this admission: Ceftriaxone 1/24 (started at OSH) >> Vancomycin 1/24 (started at OSH) >> Ampicillin 1/25 >> Acyclovir 1/25 >> Bactrim 1/25 (PCP prophylaxis) >>  Dose adjustments this admission: n/a  Microbiology results: 1/24 BCx x 2 [name]: 1/24 HSV PCR [name]: 1/24 Cryptococcal [name]: 1/24 CSF [name]: 1/25 BCx x2: 1/24 Trach asp:  1/24 MRSA PCR:   Thank you for allowing pharmacy to be a part of this patient's care.  Christoper Fabianaron Vanessa Alesi, PharmD, BCPS Clinical pharmacist, pager  [phone] 12/01/2016 11:51 PM

## 2016-12-01 NOTE — H&P (Signed)
Name: Lawrence Kane MRN: [id number] DOB: 16-Jul-1966    LOS: 0  PCCM ADMISSION NOTE  History of Present Illness: Lawrence Kane is a 26 M with PMHx of HIV/AIDS (first diagnosed 2003, last CD4 count 245 with viremia of 1,160 in October 2017; on Genovya, Darunavir and Bactrim), Genital Warts, COPD, and cocaine substance abuse who presented to [facility] on 1/24 with complaint of altered mental status. Patient was found to be diaphoretic, writhing around in bed and altered. Per notes, patient may have recently had some type of surgery in [place] where his mother resides.   At [facility] ED, patient was afebrile, tachycardic to 155, RR 18, hypertensive to 240/112. Patient was intubated for airway protection, CVL in IJ was placed, and LP was performed given his altered mental status. He was started on Versed and propofol for sedation. Initial labs showed leukocytosis of 13.9, hemoglobin 10.9, hct 33.6 and plts 269. BMET showed Sodium 139, K 3.9, Cl 101, Bicarb 21, BUN 23, Creatine 1.10, glucose 163.   ABG showed pH of 7.28, pCO2 48, pO2 158, bicarb 22.6 on FiO2 of 40%, PEEP 5, TV 450 and RR 18. Lactic Acid 3.3, 6.9. AST 125, ALT 224, and Alk Phos 88.  UA negative for infection. UDS positive for cocaine.  CSF appeared yellow, cloudy, xanthochromic, WBC 4845, RBC 625, glucose <20, total protein >300, Neutrophils 16%, Eos 64%, monocytes 17%, lymphocytes 3%. Gram stain performed, but unable to view results.   ID was consulted. Dr. Baxter Flattery recommended continuing Truvada and Tivicay through N[facility].   On exam, patient is intubated and sedated and does not respond to questions.   Lines / Drains: L IJ CVL 1/24 >> ETT 1/24 >> Foley 1/24 >> OGT 1/24 >>  Cultures: BCx 1/24 >> UCx 1/24 >> CSF Cx 1/24 >>  Antibiotics: Ceftriaxone 1/24 >> Vancomyin 1/24 >> Bactrim 1/24 >> Ampicillin 1/24 >>  Tests / Events: CXR 1/24 >> ATX vs LLL infiltrate  The patient is sedated, intubated and unable to provide  history, which was obtained for available medical records.    Past Medical History:  Diagnosis Date  . COPD (chronic obstructive pulmonary disease) ([place])   . Genital warts   . HIV (human immunodeficiency virus infection) ([place])   . Osteoporosis    Past Surgical History:  Procedure Laterality Date  . PROSTATE SURGERY  11/17/2016   Prior to Admission medications   Medication Sig Start Date End Date Taking? Authorizing Provider  azithromycin (ZITHROMAX) 600 MG tablet Take 600 mg by mouth 3 (three) times a week. 10/28/16   Historical Provider, MD  clindamycin (CLEOCIN) 150 MG capsule Take 300 mg by mouth 4 (four) times daily.    Historical Provider, MD  diphenhydrAMINE (SOMINEX) 25 MG tablet Take 25 mg by mouth every 6 (six) hours as needed for itching.    Historical Provider, MD  doxycycline (VIBRAMYCIN) 100 MG capsule Take 1 capsule (100 mg total) by mouth 2 (two) times daily. 11/28/16   Dorie Rank, MD  fluconazole (DIFLUCAN) 100 MG tablet Take 100 mg by mouth daily.    Historical Provider, MD  gabapentin (NEURONTIN) 600 MG tablet Take 600 mg by mouth 3 (three) times daily.    Historical Provider, MD  GENVOYA 150-150-200-10 MG TABS tablet Take 1 tablet by mouth daily. 10/27/16   Historical Provider, MD  ipratropium-albuterol (DUONEB) 0.5-2.5 (3) MG/3ML SOLN Take 3 mLs by nebulization every 4 (four) hours as needed (shortness of breathe).    Historical Provider, MD  oxyCODONE-acetaminophen (  PERCOCET) 10-325 MG tablet Take 1 tablet by mouth every 6 (six) hours as needed for pain. 11/19/16   Historical Provider, MD  polyvinyl alcohol (LIQUIFILM TEARS) 1.4 % ophthalmic solution Place 1 drop into both eyes daily as needed for dry eyes.    Historical Provider, MD  PREZISTA 800 MG tablet Take 800 mg by mouth daily. 10/29/16   Historical Provider, MD  TRUVADA 200-300 MG tablet Take 1 tablet by mouth daily. 10/26/16   Historical Provider, MD   Allergies Allergies  Allergen Reactions  . Iodine   .  Ketorolac     unknown  . Tylenol [Acetaminophen]     unknown    Family History No family history on file.  Social History  reports that he has been smoking.  He has never used smokeless tobacco. He reports that he drinks about 0.6 oz of alcohol per week . He reports that he does not use drugs.  Review Of Systems  11 points review of systems is negative with an exception of listed in HPI.  Physical Examination: Vitals:   12/01/16 2216  BP: (!) 152/89  Pulse: (!) 111  Resp: (!) 21  SpO2: 100%  Height: (P) 6' 4"  (1.93 m)   General: Vital signs reviewed.  Patient is chronically ill appearing, intubated and sedated, in no acute distress. HEENT: PERRLA bilaterally, intubated, L IJ in place, no JVD Cardiovascular: Tachycardic, regular rhythm, S1 normal, S2 normal, no murmurs, gallops, or rubs. Pulmonary/Chest: Clear to auscultation bilaterally, no wheezes, rales, or rhonchi. Abdominal: Soft, non-tender, non-distended, BS + GU: Large open wound on left lateral portion of scrotum with purulent discharge. Foley in place.  Extremities: No lower extremity edema bilaterally,  pulses symmetric and intact bilaterally. No cyanosis or clubbing. Neurological: Sedated, does not open eyes or follow commands, RASS -2 Psychiatric: Unable to assess  Ventilator settings: FiO2 (%):  [60 %] 60 %  Tidal Volume 620 PEEP 5 RR 16  Labs and Imaging:  Reviewed.  Please refer to the Assessment and Plan section for relevant results.  Assessment and Plan:  PULMONARY  ASSESSMENT: Acute Respiratory Failure 2/2 Underlying Sepsis and Meningitis- Intubated for airway protection Possible LLL PNA PLAN:   Vent bundle Daily SBT Vanc/Ceftriaxone 1/24 >> CXR tomorrow am  CARDIOVASCULAR  ASSESSMENT:  Sinus Tachycardia Incomplete RBBB HTN PLAN:  Telemetry Hydralazine 10 mg Q6H prn SBP >180 and/or DBP >110  RENAL  ASSESSMENT:   Lactic Acidosis PLAN:   Monitor BMET I/Os Foley in Place Trend  Lactic Acid NS 100 cc/hr   GASTROINTESTINAL  ASSESSMENT:   Transaminitis PLAN:   NPO Famotidine 20 mg IV BID Hep B core antibody, surface ab, surface ag Hep C antibody reflex   HEMATOLOGIC  ASSESSMENT:   Mild leukocytosis Mild anemia PLAN:  Trend CBC Heparin TID  INFECTIOUS  ASSESSMENT:   Bacterial Meningitis Severe Sepsis Possible LLL PNA HIV Open Wound of Scrotum PLAN:   Toxoplasma pending M. Pneumonia pending Continue Truvada and Tivicay Ceftriaxone 1/24 >> Vancomycin 1/24 >> Ampicillin given age >82 >> Acyclovir 1/24 >> Dexamethasone 15 mg Q6H 1/24 >> (discontinue if CSF gram stain and Cx are not S. pneumo)  ENDOCRINE  ASSESSMENT:   Hyperglycemia   PLAN:   SSI-R CBGs Q4H  NEUROLOGIC  ASSESSMENT:   Metabolic Encephalopathy History of Cocaine Abuse History of Personality Disorder  PLAN:   Continue propofol and fentanyl gtt RASS goal: -1  The patient is critically ill with multiple organ systems failure and requires high complexity  decision making for assessment and support, frequent evaluation and titration of therapies, application of advanced monitoring technologies and extensive interpretation of multiple databases. Critical Care Time devoted to patient care services described in this note is 39 minutes.  Martyn Malay, DO PGY-3 Internal Medicine Resident Pager # [phone] 12/01/2016 11:29 PM

## 2016-12-01 NOTE — Progress Notes (Signed)
eLink Physician-Brief Progress Note Patient Name: Lawrence NorthDarren E Kane DOB: [birth year] MRN: [id number]   Date of Service  12/01/2016  HPI/Events of Note  Patient was [facility] from [facility].   Patient known to have HIV, AIDS. Admitted with altered mental status and agitation. Intubated for airway protection. He was hypertensive postintubation secondary to agitation. Not improved with Versed and fentanyl so they switched his sedation to propofol.   Being treated as bacterial meningitis.  ABG on 40% was pH 7.4, 35, 162.   Patient seen, comfortable, sedated, intubated.  Blood pressure 154/89, heart rate 109, respiratory rate 22, O2 saturation 100% on 30% FiO2.   Currently on propofol drip.  Getting IV fluids.  Received antibiotics at the previous hospital.   eICU Interventions  Residence currently working on the patient and placing orders.   Continue ventilator support.  Continue IV fluids.  Cont broad-spectrum antibiotics. I anticipate he will be placed on Rocephin, vancomycin, ampicillin, acyclovir.  Continue airborne precautions.   Will need ID consult.   BP stable on propofol and he is not on pressors.       Intervention Category Evaluation Type: New Patient Evaluation  Louann SjogrenJose Angelo A De Dios 12/01/2016, 10:32 PM

## 2016-12-02 ENCOUNTER — Inpatient Hospital Stay (HOSPITAL_COMMUNITY): Payer: Medicaid Other

## 2016-12-02 DIAGNOSIS — F17200 Nicotine dependence, unspecified, uncomplicated: Secondary | ICD-10-CM

## 2016-12-02 DIAGNOSIS — Z886 Allergy status to analgesic agent status: Secondary | ICD-10-CM

## 2016-12-02 DIAGNOSIS — R4020 Unspecified coma: Secondary | ICD-10-CM

## 2016-12-02 DIAGNOSIS — J9600 Acute respiratory failure, unspecified whether with hypoxia or hypercapnia: Secondary | ICD-10-CM

## 2016-12-02 DIAGNOSIS — Z888 Allergy status to other drugs, medicaments and biological substances status: Secondary | ICD-10-CM

## 2016-12-02 DIAGNOSIS — R4182 Altered mental status, unspecified: Secondary | ICD-10-CM

## 2016-12-02 DIAGNOSIS — N5089 Other specified disorders of the male genital organs: Secondary | ICD-10-CM

## 2016-12-02 LAB — CBC
HEMATOCRIT: 26.1 % — AB (ref 39.0–52.0)
Hemoglobin: 8.5 g/dL — ABNORMAL LOW (ref 13.0–17.0)
MCH: 31.8 pg (ref 26.0–34.0)
MCHC: 32.6 g/dL (ref 30.0–36.0)
MCV: 97.8 fL (ref 78.0–100.0)
PLATELETS: 218 10*3/uL (ref 150–400)
RBC: 2.67 MIL/uL — ABNORMAL LOW (ref 4.22–5.81)
RDW: 16.9 % — AB (ref 11.5–15.5)
WBC: 17.2 10*3/uL — AB (ref 4.0–10.5)

## 2016-12-02 LAB — CRYPTOCOCCAL ANTIGEN, CSF: CRYPTO AG: NEGATIVE

## 2016-12-02 LAB — GLUCOSE, CAPILLARY
GLUCOSE-CAPILLARY: 237 mg/dL — AB (ref 65–99)
GLUCOSE-CAPILLARY: 238 mg/dL — AB (ref 65–99)
GLUCOSE-CAPILLARY: 278 mg/dL — AB (ref 65–99)
GLUCOSE-CAPILLARY: 286 mg/dL — AB (ref 65–99)
GLUCOSE-CAPILLARY: 318 mg/dL — AB (ref 65–99)
Glucose-Capillary: 223 mg/dL — ABNORMAL HIGH (ref 65–99)

## 2016-12-02 LAB — CSF CELL COUNT WITH DIFFERENTIAL
Eosinophils, CSF: 19 % — ABNORMAL HIGH (ref 0–1)
Eosinophils, CSF: 26 % — ABNORMAL HIGH (ref 0–1)
LYMPHS CSF: 18 % — AB (ref 40–80)
LYMPHS CSF: 20 % — AB (ref 40–80)
MONOCYTE-MACROPHAGE-SPINAL FLUID: 25 % (ref 15–45)
Monocyte-Macrophage-Spinal Fluid: 21 % (ref 15–45)
RBC Count, CSF: 114 /mm3 — ABNORMAL HIGH
RBC Count, CSF: 49 /mm3 — ABNORMAL HIGH
SEGMENTED NEUTROPHILS-CSF: 35 % — AB (ref 0–6)
SEGMENTED NEUTROPHILS-CSF: 36 % — AB (ref 0–6)
TUBE #: 4
Tube #: 1
WBC CSF: 770 /mm3 — AB (ref 0–5)
WBC CSF: 970 /mm3 — AB (ref 0–5)

## 2016-12-02 LAB — CBC WITH DIFFERENTIAL/PLATELET
BASOS ABS: 0 10*3/uL (ref 0.0–0.1)
BASOS PCT: 0 %
EOS ABS: 0.2 10*3/uL (ref 0.0–0.7)
EOS PCT: 1 %
HCT: 28.2 % — ABNORMAL LOW (ref 39.0–52.0)
Hemoglobin: 9 g/dL — ABNORMAL LOW (ref 13.0–17.0)
LYMPHS PCT: 2 %
Lymphs Abs: 0.4 10*3/uL — ABNORMAL LOW (ref 0.7–4.0)
MCH: 31 pg (ref 26.0–34.0)
MCHC: 31.9 g/dL (ref 30.0–36.0)
MCV: 97.2 fL (ref 78.0–100.0)
MONO ABS: 0.8 10*3/uL (ref 0.1–1.0)
Monocytes Relative: 4 %
Neutro Abs: 18.7 10*3/uL — ABNORMAL HIGH (ref 1.7–7.7)
Neutrophils Relative %: 93 %
PLATELETS: 229 10*3/uL (ref 150–400)
RBC: 2.9 MIL/uL — AB (ref 4.22–5.81)
RDW: 16.6 % — AB (ref 11.5–15.5)
WBC: 20.1 10*3/uL — AB (ref 4.0–10.5)

## 2016-12-02 LAB — BASIC METABOLIC PANEL
ANION GAP: 9 (ref 5–15)
BUN: 15 mg/dL (ref 6–20)
CALCIUM: 8 mg/dL — AB (ref 8.9–10.3)
CO2: 21 mmol/L — AB (ref 22–32)
CREATININE: 0.98 mg/dL (ref 0.61–1.24)
Chloride: 106 mmol/L (ref 101–111)
GFR calc Af Amer: >60 mL/min (ref >60)
GLUCOSE: 307 mg/dL — AB (ref 65–99)
Potassium: 4.2 mmol/L (ref 3.5–5.1)
Sodium: 136 mmol/L (ref 135–145)

## 2016-12-02 LAB — LACTIC ACID, PLASMA
LACTIC ACID, VENOUS: 1.3 mmol/L (ref 0.5–1.9)
Lactic Acid, Venous: 1.3 mmol/L (ref 0.5–1.9)

## 2016-12-02 LAB — COMPREHENSIVE METABOLIC PANEL
ALK PHOS: 46 U/L (ref 38–126)
ALT: 146 U/L — AB (ref 17–63)
ANION GAP: 8 (ref 5–15)
AST: 100 U/L — ABNORMAL HIGH (ref 15–41)
Albumin: 2.4 g/dL — ABNORMAL LOW (ref 3.5–5.0)
BILIRUBIN TOTAL: 0.9 mg/dL (ref 0.3–1.2)
BUN: 18 mg/dL (ref 6–20)
CALCIUM: 8.2 mg/dL — AB (ref 8.9–10.3)
CO2: 22 mmol/L (ref 22–32)
CREATININE: 1 mg/dL (ref 0.61–1.24)
Chloride: 105 mmol/L (ref 101–111)
Glucose, Bld: 329 mg/dL — ABNORMAL HIGH (ref 65–99)
Potassium: 4.4 mmol/L (ref 3.5–5.1)
SODIUM: 135 mmol/L (ref 135–145)
TOTAL PROTEIN: 6.6 g/dL (ref 6.5–8.1)

## 2016-12-02 LAB — MRSA PCR SCREENING: MRSA by PCR: POSITIVE — AB

## 2016-12-02 LAB — T-HELPER CELLS (CD4) COUNT (NOT AT ARMC)
CD4 % Helper T Cell: 3 % — ABNORMAL LOW (ref 33–55)
CD4 T Cell Abs: 10 /uL — ABNORMAL LOW (ref 400–2700)

## 2016-12-02 LAB — POCT I-STAT 3, ART BLOOD GAS (G3+)
BICARBONATE: 24 mmol/L (ref 20.0–28.0)
O2 SAT: 99 %
TCO2: 25 mmol/L (ref 0–100)
pCO2 arterial: 33.5 mmHg (ref 32.0–48.0)
pH, Arterial: 7.464 — ABNORMAL HIGH (ref 7.350–7.450)
pO2, Arterial: 131 mmHg — ABNORMAL HIGH (ref 83.0–108.0)

## 2016-12-02 LAB — PROTEIN AND GLUCOSE, CSF
Glucose, CSF: 20 mg/dL — CL (ref 40–70)
Total  Protein, CSF: >600 mg/dL — ABNORMAL HIGH (ref 15–45)

## 2016-12-02 LAB — CRYPTOCOCCAL ANTIGEN: CRYPTO AG: NEGATIVE

## 2016-12-02 LAB — TRIGLYCERIDES: TRIGLYCERIDES: 109 mg/dL (ref <150)

## 2016-12-02 LAB — MAGNESIUM: Magnesium: 2.3 mg/dL (ref 1.7–2.4)

## 2016-12-02 LAB — PHOSPHORUS: Phosphorus: 2.2 mg/dL — ABNORMAL LOW (ref 2.5–4.6)

## 2016-12-02 MED ORDER — VANCOMYCIN HCL 10 G IV SOLR
1250.0000 mg | INTRAVENOUS | Status: DC
  Filled 2016-12-02: qty 1250

## 2016-12-02 MED ORDER — DEXTROSE 5 % IV SOLN
2.0000 g | Freq: Two times a day (BID) | INTRAVENOUS | Status: DC
  Administered 2016-12-02 – 2016-12-07 (×12): 2 g via INTRAVENOUS
  Filled 2016-12-02 (×13): qty 2

## 2016-12-02 MED ORDER — INSULIN ASPART 100 UNIT/ML ~~LOC~~ SOLN
0.0000 [IU] | SUBCUTANEOUS | Status: DC
  Administered 2016-12-03 (×2): 7 [IU] via SUBCUTANEOUS
  Administered 2016-12-03: 11 [IU] via SUBCUTANEOUS

## 2016-12-02 MED ORDER — DEXTROSE 5 % IV SOLN
10.0000 mg/kg | Freq: Three times a day (TID) | INTRAVENOUS | Status: DC
  Administered 2016-12-02 – 2016-12-03 (×3): 930 mg via INTRAVENOUS
  Filled 2016-12-02 (×4): qty 18.6

## 2016-12-02 MED ORDER — DEXTROSE 5 % IV SOLN
10.0000 mg/kg | Freq: Three times a day (TID) | INTRAVENOUS | Status: DC
  Administered 2016-12-02: 1110 mg via INTRAVENOUS
  Filled 2016-12-02 (×2): qty 22.2

## 2016-12-02 MED ORDER — INSULIN ASPART 100 UNIT/ML ~~LOC~~ SOLN
2.0000 [IU] | SUBCUTANEOUS | Status: DC
  Administered 2016-12-02 (×3): 6 [IU] via SUBCUTANEOUS

## 2016-12-02 MED ORDER — INSULIN DETEMIR 100 UNIT/ML ~~LOC~~ SOLN
10.0000 [IU] | Freq: Two times a day (BID) | SUBCUTANEOUS | Status: DC
  Administered 2016-12-02 (×2): 10 [IU] via SUBCUTANEOUS
  Filled 2016-12-02 (×3): qty 0.1

## 2016-12-02 MED ORDER — DARUNAVIR-COBICISTAT 800-150 MG PO TABS
1.0000 | ORAL_TABLET | Freq: Every day | ORAL | Status: DC
  Administered 2016-12-03 – 2017-01-04 (×28): 1 via ORAL
  Filled 2016-12-02 (×39): qty 1

## 2016-12-02 MED ORDER — SULFAMETHOXAZOLE-TRIMETHOPRIM 200-40 MG/5ML PO SUSP
20.0000 mL | ORAL | Status: DC
  Administered 2016-12-02 – 2016-12-06 (×3): 20 mL
  Filled 2016-12-02 (×3): qty 20

## 2016-12-02 MED ORDER — VANCOMYCIN HCL IN DEXTROSE 1-5 GM/200ML-% IV SOLN
1000.0000 mg | Freq: Three times a day (TID) | INTRAVENOUS | Status: DC
  Administered 2016-12-02 – 2016-12-04 (×8): 1000 mg via INTRAVENOUS
  Filled 2016-12-02 (×8): qty 200

## 2016-12-02 NOTE — Progress Notes (Signed)
Fentanyl bag from other facility expired. Witnessed by Zella Ballobin, RN waste in the sink 50ml.

## 2016-12-02 NOTE — Progress Notes (Signed)
After WUA done, and sedation is off Pt is still unresponsive to pain. Pupils are 2 and very sluggish. MD is notified and aware.  Spoke to ID MD, Airborne can be taken off. Droplet and contact still on.  MD spoke to the family and updated about the situation.  Family at the bedside , praying.

## 2016-12-02 NOTE — Progress Notes (Signed)
Inpatient Diabetes Program Recommendations  AACE/ADA: New Consensus Statement on Inpatient Glycemic Control (2015)  Target Ranges:  Prepandial:   less than 140 mg/dL      Peak postprandial:   less than 180 mg/dL (1-2 hours)      Critically ill patients:  140 - 180 mg/dL   Results for Lawrence NorthORMAN, Lawrence E (MRN [id number]) as of 12/02/2016 08:25  Ref. Range 12/01/2016 22:26 12/01/2016 23:49 12/02/2016 03:47  Glucose-Capillary Latest Ref Range: 65 - 99 mg/dL 562302 (H) 130296 (H) 865318 (H)    Admit with: Bacterial Meningitis/ Sepsis  History: HIV, COPD, Cocaine Abuse  Current Insulin Orders: Novolog Resistant Correction Scale/ SSI (0-20 units) Q4 hours      MD- Note patient getting Decadron 15 mg Q6 hours.  Glucose >300 mg/dl.  Since patient is intubated, please consider switching patient to the ICU Glycemic Control Protocol      --Will follow patient during hospitalization--  Ambrose FinlandJeannine Johnston Colleene Swarthout RN, MSN, CDE Diabetes Coordinator Inpatient Glycemic Control Team Team Pager: [phone] (8a-5p)

## 2016-12-02 NOTE — Progress Notes (Signed)
Initial Nutrition Assessment  DOCUMENTATION CODES:   Not applicable  INTERVENTION:    If unable to extubate patient within the next 24 hours, recommend initiate TF via OGT with Vital AF 1.2 at goal rate of 75 ml/h (1800 ml per day) to provide 2160 kcals, 135 gm protein, 1460 ml free water daily.  NUTRITION DIAGNOSIS:   Inadequate oral intake related to inability to eat as evidenced by NPO status.  GOAL:   Patient will meet greater than or equal to 90% of their needs  MONITOR:   Vent status, Labs, I & O's  REASON FOR ASSESSMENT:   Ventilator    ASSESSMENT:   51 year old man with HIV/AIDS (first diagnosed 2003, last CD4 count 245 with viremia of 1,160 in October 2017; on Genovya, Darunavir and Bactrim), Genital Warts, COPD, and cocaine substance abuse who [facility] with altered mental status. Intubated for airway protection and [facility].  Discussed patient in ICU rounds and with RN today. Patient is now on no sedation per RN. S/P CT of head this AM, awaiting results. Nutrition-Focused physical exam completed. Findings are no fat depletion, no muscle depletion, and no edema.  Patient is currently intubated on ventilator support MV: 12.9 L/min Temp (24hrs), Avg:99.2 F (37.3 C), Min:99 F (37.2 C), Max:99.3 F (37.4 C)  Propofol: off Labs reviewed: phosphorus low at 2.2. Medications reviewed and include Decadron.  Diet Order:  Diet NPO time specified  Skin:  Wound (see comment) (non pressure wound to scrotum)  Last BM:  unknown  Height:   Ht Readings from Last 1 Encounters:  12/01/16 6\' 4"  (1.93 m)    Weight:   Wt Readings from Last 1 Encounters:  12/01/16 205 lb (93 kg)    Ideal Body Weight:  91.8 kg  BMI:  Body mass index is 24.95 kg/m.  Estimated Nutritional Needs:   Kcal:  2250  Protein:  120-140 gm  Fluid:  2.3 L  EDUCATION NEEDS:   No education needs identified at this time  Joaquin CourtsKimberly Amardeep Beckers, RD,  LDN, CNSC Pager [phone] After Hours Pager [phone]

## 2016-12-02 NOTE — Procedures (Signed)
Pt transported to CT on vent, no complications  

## 2016-12-02 NOTE — Procedures (Signed)
Lumbar Puncture Procedure Note  Pre-operative Diagnosis: Acute encephalopathy  Post-operative Diagnosis: same  Indications: Meningitis  Procedure Details  Consent: Informed consent was obtained. Risks of the procedure were discussed including: infection, bleeding, pain.  The patient was positioned. The area was prepped with chlorhexidine and draped in the usual sterile fashion.  1% plain lidocaine was used to anesthetize the L4-L5 innerspace. Using landmarks, a 22 guage spinal needle was inserted in the L4-L5 innerspace. The stylet was removed and the opening pressure was measured at 34 cm of water. Fluid was obtained without any difficulties and minimal blood loss.  A dressing was applied to the wound.   Findings About 15 ml of yellow, slightly cloudy cerebralspinal fluid was obtained. A sample was sent to Pathology for cell counts, as well as for infection analysis.  Complications:  None; patient tolerated the procedure well.          Condition: stable  I called and updated the family that we were able to obtain fluid.

## 2016-12-02 NOTE — Consult Note (Addendum)
[facility] for Infectious Disease       Reason for Consult: meningitis, HIV    Referring Physician: Dr. Vaughan Browner  Active Problems:   COPD (chronic obstructive pulmonary disease) ([place])   Substance abuse   Genital warts   HIV disease ([place])   AIDS ([place])   Noncompliance   Bacterial meningitis   Hypertensive urgency   Septic shock ([place])   Transaminitis   Cocaine abuse   Open wound   Acute respiratory failure with hypoxia ([place])   . acyclovir  10 mg/kg Intravenous Q8H  . ampicillin (OMNIPEN) IV  2 g Intravenous Q4H  . cefTRIAXone (ROCEPHIN)  IV  2 g Intravenous Q12H  . chlorhexidine gluconate (MEDLINE KIT)  15 mL Mouth Rinse BID  . darunavir-cobicistat  1 tablet Oral Q breakfast  . dexamethasone  15 mg Intravenous Q6H  . dolutegravir  50 mg Oral Daily  . emtricitabine-tenofovir AF  1 tablet Per Tube Daily  . famotidine (PEPCID) IV  20 mg Intravenous Q12H  . fentaNYL (SUBLIMAZE) injection  50 mcg Intravenous Once  . heparin  5,000 Units Subcutaneous Q8H  . insulin aspart  0-20 Units Subcutaneous Q4H  . mouth rinse  15 mL Mouth Rinse 10 times per day  . sulfamethoxazole-trimethoprim  20 mL Per Tube Once per day on Mon Wed Fri  . vancomycin  1,000 mg Intravenous Q8H    Recommendations: ARVs per pharmacy per tube - Tivicay, Truvada/descovy and boosted PI Continue vancomycin, ceftriaxone, ampicillin Keep acyclovir for the scrotal lesions  Bactrim prophylaxis Cryptococcal Ag in blood  Please check on CSF cryptococcal Ag done at [facility] pending at this time Droplet isolation Contact isolation I recommend reLP for CSF, his high eosinophilia is not typical.  I would like to see if that persists, suggesting parasitic disease or if an anomaly and more c/w bacterial as suspected, also could check fungal (like coccidio, AFB, parasites), opening pressure Agree with CT of head  Assessment: He has likely bacterial meningitis with high protein, low glucose, high WBCs in CSF,  though interestingly he has a predominance of eosinophilia in his CSF.  Gram stain is negative.  Differential in him is broad with poorly controlled HIV  Antibiotics: Vancomycin, ceftriaxone, ampicllin, acyclovir  HPI: Lawrence Kane is a 51 y.o. male with HIV/aids, poorly controlled, drug abuse, followed by the [facility] clinic who presented to [facility] ED with AMS, confusion and work up with LP most consistent with bacterial meningitis.  No history otherwise obtainable from the patient.   Some transaminitis. 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  Initially in [place].  CXR ok.  CSF studies as above.   CXR independently reviewed  Review of Systems:  Unable to be assessed due to mental status    Past Medical History:  Diagnosis Date  . COPD (chronic obstructive pulmonary disease) ([place])   . Genital warts   . HIV (human immunodeficiency virus infection) ([place])   . Osteoporosis     Social History  Substance Use Topics  . Smoking status: Current Every Day Smoker  . Smokeless tobacco: Never Used  . Alcohol use 0.6 oz/week    1 Cans of beer per week    No family history on file.unobtainable  Allergies  Allergen Reactions  . Iodine   . Ketorolac     unknown  . Tylenol [Acetaminophen]     unknown    Physical Exam: Constitutional: no response Vitals:   12/02/16 0800 12/02/16 0945  BP: (!) 146/89 (!) 150/87  Pulse: (!) 103 (!) 101  Resp: (!) 23 (!) 24  Temp:     EYES: anicteric ENMT: +ET Cardiovascular: Cor Tachy Respiratory: coarse anterior breath sounds; on vent GI: Bowel sounds are normal, liver is not enlarged, spleen is not enlarged Musculoskeletal: no pedal edema noted Skin: negatives: no rash GU: ulcerative area on left scrotum  Lab Results  Component Value Date   WBC 17.2 (H) 12/02/2016   HGB 8.5 (L) 12/02/2016   HCT 26.1 (L) 12/02/2016   MCV 97.8 12/02/2016   PLT 218 12/02/2016    Lab Results  Component Value Date   CREATININE 0.98  12/02/2016   BUN 15 12/02/2016   NA 136 12/02/2016   K 4.2 12/02/2016   CL 106 12/02/2016   CO2 21 (L) 12/02/2016    Lab Results  Component Value Date   ALT 146 (H) 12/02/2016   AST 100 (H) 12/02/2016   ALKPHOS 46 12/02/2016     Microbiology: Recent Results (from the past 240 hour(s))  MRSA PCR Screening     Status: Abnormal   Collection Time: 12/01/16 10:26 PM  Result Value Ref Range Status   MRSA by PCR POSITIVE (A) NEGATIVE Final    Comment:        The GeneXpert MRSA Assay (FDA approved for NASAL specimens only), is one component of a comprehensive MRSA colonization surveillance program. It is not intended to diagnose MRSA infection nor to guide or monitor treatment for MRSA infections. RESULT CALLED TO, READ BACK BY AND VERIFIED WITH: R DONAHUE,RN @0326  12/02/16 Norval Gable, [facility] [email or web address] [phone] pager  [phone] cell 12/02/2016, 10:23 AM

## 2016-12-02 NOTE — Progress Notes (Signed)
PULMONARY / CRITICAL CARE MEDICINE   Name: Lawrence Kane MRN: [id number] DOB: 11/03/1966    ADMISSION DATE:  12/01/2016 CONSULTATION DATE:  12/01/2016  REFERRING MD:  Machias transfer  CHIEF COMPLAINT:  AMS  HISTORY OF PRESENT ILLNESS:   51-year-old man with HIV/AIDS (first diagnosed 2003, last CD4 count 245 with viremia of 1,160 in October 2017; on Genovya, Darunavir and Bactrim), Genital Warts, COPD, and cocaine substance abuse who presented to [facility] on 1/24 with complaint of altered mental status. Per notes, patient may have recently had some type of surgery in [place] where his mother resides. Afebrile, tachycardic to 155, RR 18, hypertensive to 240/112. Patient was intubated for airway protection, CVL in IJ was placed, and LP was performed given his altered mental status. He was started on Versed and propofol for sedation. Initial labs showed leukocytosis of 13.9, hemoglobin 10.9, hct 33.6 and plts 269. BMET showed Sodium 139, K 3.9, Cl 101, Bicarb 21, BUN 23, Creatine 1.10, glucose 163. ABG showed pH of 7.28, pCO2 48, pO2 158, on FiO2 of 40%, PEEP 5, TV 450 and RR 18. Lactic Acid 3.3, 6.9. AST 125, ALT 224, and Alk Phos 88. UA negative for infection. UDS positive for cocaine. CSF appeared yellow, cloudy, xanthochromic, WBC 4845, RBC 625, glucose <20, total protein >300, Neutrophils 16%, Eos 64%, monocytes 17%, lymphocytes 3%. Gram stain performed, but unable to view results. ID was consulted. Dr. Snider recommended continuing Truvada and Tivicay through NGT  SUBJECTIVE:  Remains intubated and sedated this morning  VITAL SIGNS: BP (!) 145/84   Pulse (!) 103   Temp 99 F (37.2 C) (Oral)   Resp 18   Ht 6' 4" (1.93 m)   Wt 205 lb (93 kg)   SpO2 99%   BMI 24.95 kg/m   HEMODYNAMICS:    VENTILATOR SETTINGS: Vent Mode: PRVC FiO2 (%):  [30 %-60 %] 30 % Set Rate:  [16 bmp] 16 bmp Vt Set:  [620 mL] 620 mL PEEP:  [5 cmH20] 5 cmH20 Plateau Pressure:  [16 cmH20] 16  cmH20  INTAKE / OUTPUT: I/O last 3 completed shifts: In: 1319.1 [I.V.:587.1; NG/GT:60; IV Piggyback:672] Out: 1600 [Urine:1400; Emesis/NG output:200]  PHYSICAL EXAMINATION: General:  NAD Neuro:  Sedated HEENT:  [place]/AT, condyloma noted around mouth Cardiovascular:  Tachycardic Lungs:  [place] Abdomen:  Soft, mildly distended Musculoskeletal:  No LE edema Skin:  Scrotal wound  LABS:  BMET  Recent Labs Lab 11/28/16 2205 12/02/16 0009 12/02/16 0530  NA 135 135 136  K 4.6 4.4 4.2  CL 105 105 106  CO2 24 22 21*  BUN 41* 18 15  CREATININE 1.74* 1.00 0.98  GLUCOSE 250* 329* 307*    Electrolytes  Recent Labs Lab 11/28/16 2205 12/02/16 0009 12/02/16 0530  CALCIUM 8.7* 8.2* 8.0*  MG  --   --  2.3  PHOS  --   --  2.2*    CBC  Recent Labs Lab 11/28/16 2205 12/02/16 0009 12/02/16 0530  WBC 10.9* 20.1* 17.2*  HGB 9.1* 9.0* 8.5*  HCT 27.4* 28.2* 26.1*  PLT 260 229 218    Coag's No results for input(s): APTT, INR in the last 168 hours.  Sepsis Markers  Recent Labs Lab 12/02/16 0009 12/02/16 0530  LATICACIDVEN 1.3 1.3    ABG No results for input(s): PHART, PCO2ART, PO2ART in the last 168 hours.  Liver Enzymes  Recent Labs Lab 12/02/16 0009  AST 100*  ALT 146*  ALKPHOS 46  BILITOT 0.9  ALBUMIN   2.4*    Cardiac Enzymes No results for input(s): TROPONINI, PROBNP in the last 168 hours.  Glucose  Recent Labs Lab 12/01/16 2226 12/01/16 2349 12/02/16 0347  GLUCAP 302* 296* 318*    Imaging Dg Chest Port 1 View  Result Date: 12/02/2016 CLINICAL DATA:  Intubation. EXAM: PORTABLE CHEST 1 VIEW COMPARISON:  12/01/2016 . FINDINGS: Interim placement of NG tube, its tip is below left hemidiaphragm P Endotracheal tube, left IJ line in stable position. Heart size stable . Low lung volumes with bibasilar atelectasis. Left base infiltrate cannot be excluded. No pleural effusion or pneumothorax. IMPRESSION: 1. Interim placement NG tube, its tip is below  the left hemidiaphragm. 2. Low lung volumes with bibasilar atelectasis. Left base infiltrate cannot be excluded. Electronically Signed   By: Thomas  Register   On: 12/02/2016 07:06   Dg Abd Portable 1v  Result Date: 12/01/2016 CLINICAL DATA:  51-year-old male. Evaluate for enteric tube placement. EXAM: PORTABLE ABDOMEN - 1 VIEW COMPARISON:  Abdominal radiograph dated 12/01/2016 FINDINGS: An enteric tube is partially visualized with tip located in the left upper abdomen, likely in the proximal stomach. There is no bowel dilatation or evidence of obstruction. No free air or radiopaque calculi identified. The soft tissues and osseous structures appear unremarkable. IMPRESSION: Enteric tube with tip in the left upper abdomen likely in the proximal stomach. Electronically Signed   By: Arash  Radparvar M.D.   On: 12/01/2016 22:52     STUDIES:  CXR 1/24 >> ATX vs LLL infiltrate  CULTURES: 1/24 BCx x 2 ([place]): 1/24 HSV PCR ([place]): 1/24 Cryptococcal ([place]): 1/24 CSF ([place]): 1/25 BCx x2 1/25: 1/24 Trach asp 1/24:  1/24 MRSA PCR:   ANTIBIOTICS: Rocephin 1/24>> Vancomycin 1/24>> Acyclovir 1/24>> Ampicillin 1/24>> Bactrim M/W/F for PCP prophylaxis  SIGNIFICANT EVENTS: 1/24> Transferred from [place]  LINES/TUBES: L IJ CVL 1/24 >> ETT 1/24 >> Foley 1/24 >> OGT 1/24 >>  DISCUSSION: 51-year-old man with HIV/AIDS (first diagnosed 2003, last CD4 count 245 with viremia of 1,160 in October 2017; on Genovya, Darunavir and Bactrim), Genital Warts, COPD, and cocaine substance abuse who presented to  Hospital on 1/24 with altered mental status. Intubated for airway protection.   ASSESSMENT / PLAN:  PULMONARY A: Acute Respiratory Failure 2/2 Underlying Sepsis and Meningitis- Intubated for airway protection Possible LLL PNA P:   Full vent support Abx as below  CARDIOVASCULAR A:  Sinus Tachycardia Incomplete RBBB HTN P:  Hydralazine 10 mg Q6H prn SBP >180 and/or  DBP >110  RENAL A:   Lactic Acidosis, resolved P:   NS 100 cc/hr   GASTROINTESTINAL A:   Transaminitis P:   NPO Famotidine 20 mg IV BID Hep B core antibody, surface ab, surface ag Hep C antibody reflex  HEMATOLOGIC A:   Mild leukocytosis Mild anemia P:  VTE ppx  INFECTIOUS A:   Bacterial meningitis Possible LLL PNA HIV Open Wound of Scrotum P:   Toxoplasma pending M. Pneumonia pending Continue Descovyand Tivicay Ceftriaxone 1/24 >> Vancomycin 1/24 >> Ampicillin given age >50 >> Acyclovir 1/24 >> Dexamethasone 15 mg Q6H 1/24 >> (discontinue if CSF gram stain and Cx are not S. Pneumo) Bactrim MWF  ENDOCRINE A:   Hyperglycemia   P:   SSI-R CBGs Q4H  NEUROLOGIC A:   Metabolic Encephalopathy History of Cocaine Abuse History of Personality Disorder  P:   Continue propofol and fentanyl gtt RASS goal: 0 to -1   FAMILY  - Updates: No family at bedside.  - Inter-disciplinary family   meet or Palliative Care meeting due by:  1/31     , MD  Internal Medicine PGY-3 Pager: [phone] If no answer or after 3PM: [phone]  12/02/2016, 7:59 AM   

## 2016-12-02 NOTE — Progress Notes (Signed)
Critical results were called to BB at this time: CSF #1 WCC 970,  #4 WCC 770, Glucose <20,  protein >600. MD aware

## 2016-12-02 NOTE — Consult Note (Signed)
WOC Nurse wound consult note Reason for Consult: scrotal wound at outside facility HIV+ now admitted with meningitis, encephalopathy  Wound type: surgical, unclear actual procedure, no family at bedside to obtain hx.  Patient intubated and sedated  Pressure Injury POA: Yes/No Measurement: 9cm x 5cm x 0.2cm  Wound bed: 100% pink, early granulation, some hypergranulation Drainage (amount, consistency, odor) moderate to heavy, with slight odor, yellow Periwound: intact with evidence of genital warts.  Unclear if this surgical intervention was related to this  Dressing procedure/placement/frequency: Add silver hydrofiber for exudate management, treat biofilm.  Change daily to begin with, may decrease frequency of dressing change if drainage decreases.  Discussed POC with bedside nurse.  Re consult if needed, will not follow at this time. Thanks  Joran Kallal M[employer] MSN, RN,CWOCN, CNS [phone])

## 2016-12-03 ENCOUNTER — Inpatient Hospital Stay (HOSPITAL_COMMUNITY): Payer: Medicaid Other

## 2016-12-03 DIAGNOSIS — Z9889 Other specified postprocedural states: Secondary | ICD-10-CM

## 2016-12-03 LAB — COMPREHENSIVE METABOLIC PANEL
ALT: 149 U/L — AB (ref 17–63)
ANION GAP: 4 — AB (ref 5–15)
AST: 128 U/L — ABNORMAL HIGH (ref 15–41)
Albumin: 2 g/dL — ABNORMAL LOW (ref 3.5–5.0)
Alkaline Phosphatase: 39 U/L (ref 38–126)
BUN: 19 mg/dL (ref 6–20)
CO2: 25 mmol/L (ref 22–32)
CREATININE: 1.04 mg/dL (ref 0.61–1.24)
Calcium: 8.1 mg/dL — ABNORMAL LOW (ref 8.9–10.3)
Chloride: 107 mmol/L (ref 101–111)
GFR calc non Af Amer: >60 mL/min (ref >60)
Glucose, Bld: 236 mg/dL — ABNORMAL HIGH (ref 65–99)
Potassium: 4.5 mmol/L (ref 3.5–5.1)
Sodium: 136 mmol/L (ref 135–145)
Total Bilirubin: 0.6 mg/dL (ref 0.3–1.2)
Total Protein: 6.3 g/dL — ABNORMAL LOW (ref 6.5–8.1)

## 2016-12-03 LAB — HEPATITIS B SURFACE ANTIBODY, QUANTITATIVE

## 2016-12-03 LAB — GLUCOSE, CAPILLARY
GLUCOSE-CAPILLARY: 271 mg/dL — AB (ref 65–99)
GLUCOSE-CAPILLARY: 185 mg/dL — AB (ref 65–99)
GLUCOSE-CAPILLARY: 245 mg/dL — AB (ref 65–99)
Glucose-Capillary: 209 mg/dL — ABNORMAL HIGH (ref 65–99)
Glucose-Capillary: 244 mg/dL — ABNORMAL HIGH (ref 65–99)
Glucose-Capillary: 165 mg/dL — ABNORMAL HIGH (ref 65–99)

## 2016-12-03 LAB — CBC
HEMATOCRIT: 25 % — AB (ref 39.0–52.0)
HEMOGLOBIN: 8 g/dL — AB (ref 13.0–17.0)
MCH: 31 pg (ref 26.0–34.0)
MCHC: 32 g/dL (ref 30.0–36.0)
MCV: 96.9 fL (ref 78.0–100.0)
Platelets: 218 10*3/uL (ref 150–400)
RBC: 2.58 MIL/uL — AB (ref 4.22–5.81)
RDW: 17 % — ABNORMAL HIGH (ref 11.5–15.5)
WBC: 12.5 10*3/uL — ABNORMAL HIGH (ref 4.0–10.5)

## 2016-12-03 LAB — HEPATITIS C ANTIBODY (REFLEX)

## 2016-12-03 LAB — COMMENT2 - HEP PANEL

## 2016-12-03 LAB — TROPONIN I: Troponin I: 0.04 ng/mL (ref <0.03)

## 2016-12-03 LAB — HEPATITIS B SURFACE ANTIGEN

## 2016-12-03 LAB — HIV-1 RNA QUANT-NO REFLEX-BLD
HIV 1 RNA QUANT: 80 {copies}/mL
LOG10 HIV-1 RNA: 1.903 log10copy/mL

## 2016-12-03 LAB — TRIGLYCERIDES: Triglycerides: 134 mg/dL (ref <150)

## 2016-12-03 LAB — PHOSPHORUS: PHOSPHORUS: 3.1 mg/dL (ref 2.5–4.6)

## 2016-12-03 LAB — HEPATITIS B SURFACE AG, CONFIRM: HBsAg Confirmation: POSITIVE — AB

## 2016-12-03 LAB — MAGNESIUM: Magnesium: 2.2 mg/dL (ref 1.7–2.4)

## 2016-12-03 LAB — HEPATITIS B CORE ANTIBODY, IGM: HEP B C IGM: NEGATIVE

## 2016-12-03 MED ORDER — VITAL AF 1.2 CAL PO LIQD
1000.0000 mL | ORAL | Status: DC
  Administered 2016-12-03 – 2016-12-16 (×17): 1000 mL

## 2016-12-03 MED ORDER — PRO-STAT SUGAR FREE PO LIQD: 30.0000 mL | Freq: Two times a day (BID) | ORAL | Status: DC

## 2016-12-03 MED ORDER — DEXTROSE 5 % IV SOLN
1080.0000 mg | Freq: Three times a day (TID) | INTRAVENOUS | Status: DC
  Filled 2016-12-03: qty 21.6

## 2016-12-03 MED ORDER — MIDAZOLAM HCL 2 MG/2ML IJ SOLN
INTRAMUSCULAR | Status: AC
  Filled 2016-12-03: qty 2

## 2016-12-03 MED ORDER — INSULIN DETEMIR 100 UNIT/ML ~~LOC~~ SOLN
15.0000 [IU] | Freq: Two times a day (BID) | SUBCUTANEOUS | Status: DC
  Administered 2016-12-03 – 2016-12-04 (×3): 15 [IU] via SUBCUTANEOUS
  Filled 2016-12-03 (×4): qty 0.15

## 2016-12-03 MED ORDER — INSULIN ASPART 100 UNIT/ML ~~LOC~~ SOLN
0.0000 [IU] | SUBCUTANEOUS | Status: DC
  Administered 2016-12-03: 7 [IU] via SUBCUTANEOUS
  Administered 2016-12-03: 4 [IU] via SUBCUTANEOUS
  Administered 2016-12-04: 11 [IU] via SUBCUTANEOUS
  Administered 2016-12-04: 4 [IU] via SUBCUTANEOUS
  Administered 2016-12-04: 7 [IU] via SUBCUTANEOUS
  Administered 2016-12-04: 15 [IU] via SUBCUTANEOUS
  Administered 2016-12-04: 7 [IU] via SUBCUTANEOUS
  Administered 2016-12-04 – 2016-12-06 (×8): 11 [IU] via SUBCUTANEOUS
  Administered 2016-12-06: 7 [IU] via SUBCUTANEOUS
  Administered 2016-12-06: 11 [IU] via SUBCUTANEOUS

## 2016-12-03 MED ORDER — DEXTROSE 5 % IV SOLN
1000.0000 mg | Freq: Three times a day (TID) | INTRAVENOUS | Status: DC
  Filled 2016-12-03: qty 20

## 2016-12-03 MED ORDER — PROPOFOL 1000 MG/100ML IV EMUL
0.0000 ug/kg/min | INTRAVENOUS | Status: DC
  Administered 2016-12-03: 50 ug/kg/min via INTRAVENOUS
  Administered 2016-12-03: 40 ug/kg/min via INTRAVENOUS
  Administered 2016-12-04 (×2): 30 ug/kg/min via INTRAVENOUS
  Administered 2016-12-04: 40 ug/kg/min via INTRAVENOUS
  Administered 2016-12-04 – 2016-12-05 (×4): 30 ug/kg/min via INTRAVENOUS
  Administered 2016-12-05: 40 ug/kg/min via INTRAVENOUS
  Administered 2016-12-05: 10 ug/kg/min via INTRAVENOUS
  Administered 2016-12-06 (×2): 20 ug/kg/min via INTRAVENOUS
  Administered 2016-12-06: 30 ug/kg/min via INTRAVENOUS
  Administered 2016-12-06: 20 ug/kg/min via INTRAVENOUS
  Administered 2016-12-07: 40 ug/kg/min via INTRAVENOUS
  Administered 2016-12-07: 20 ug/kg/min via INTRAVENOUS
  Administered 2016-12-07: 30 ug/kg/min via INTRAVENOUS
  Administered 2016-12-07: 20 ug/kg/min via INTRAVENOUS
  Administered 2016-12-08: 25 ug/kg/min via INTRAVENOUS
  Administered 2016-12-08: 40 ug/kg/min via INTRAVENOUS
  Administered 2016-12-08: 10 ug/kg/min via INTRAVENOUS
  Administered 2016-12-09: 30 ug/kg/min via INTRAVENOUS
  Administered 2016-12-09: 35 ug/kg/min via INTRAVENOUS
  Administered 2016-12-09: 10 ug/kg/min via INTRAVENOUS
  Administered 2016-12-10: 40 ug/kg/min via INTRAVENOUS
  Administered 2016-12-10: 20 ug/kg/min via INTRAVENOUS
  Administered 2016-12-10: 40 ug/kg/min via INTRAVENOUS
  Administered 2016-12-10: 20 ug/kg/min via INTRAVENOUS
  Administered 2016-12-11: 30 ug/kg/min via INTRAVENOUS
  Administered 2016-12-11 – 2016-12-14 (×11): 20 ug/kg/min via INTRAVENOUS
  Filled 2016-12-03 (×18): qty 100
  Filled 2016-12-03: qty 200
  Filled 2016-12-03 (×4): qty 100
  Filled 2016-12-03: qty 200
  Filled 2016-12-03 (×19): qty 100

## 2016-12-03 MED ORDER — SODIUM CHLORIDE 0.9 % IV SOLN
25.0000 mg | Freq: Three times a day (TID) | INTRAVENOUS | Status: DC | PRN
  Administered 2016-12-04 – 2016-12-07 (×5): 25 mg via INTRAVENOUS
  Filled 2016-12-03 (×8): qty 1

## 2016-12-03 MED ORDER — INSULIN DETEMIR 100 UNIT/ML ~~LOC~~ SOLN
15.0000 [IU] | Freq: Two times a day (BID) | SUBCUTANEOUS | Status: DC
  Administered 2016-12-03: 15 [IU] via SUBCUTANEOUS
  Filled 2016-12-03: qty 0.15

## 2016-12-03 MED ORDER — HYDRALAZINE HCL 20 MG/ML IJ SOLN
10.0000 mg | Freq: Four times a day (QID) | INTRAMUSCULAR | Status: DC | PRN
  Administered 2016-12-17 – 2016-12-24 (×4): 10 mg via INTRAVENOUS
  Filled 2016-12-03 (×6): qty 1

## 2016-12-03 MED ORDER — MIDAZOLAM HCL 2 MG/2ML IJ SOLN
2.0000 mg | INTRAMUSCULAR | Status: AC | PRN
  Administered 2016-12-04 – 2016-12-07 (×3): 2 mg via INTRAVENOUS
  Filled 2016-12-03 (×2): qty 2

## 2016-12-03 MED ORDER — FAMOTIDINE 40 MG/5ML PO SUSR
20.0000 mg | Freq: Two times a day (BID) | ORAL | Status: DC
  Administered 2016-12-03 – 2016-12-23 (×35): 20 mg
  Filled 2016-12-03 (×49): qty 2.5

## 2016-12-03 MED ORDER — SODIUM CHLORIDE 0.9 % IV SOLN
INTRAVENOUS | Status: DC
  Filled 2016-12-03: qty 2.5

## 2016-12-03 MED ORDER — MIDAZOLAM HCL 2 MG/2ML IJ SOLN
2.0000 mg | INTRAMUSCULAR | Status: DC | PRN
  Administered 2016-12-03 – 2016-12-16 (×15): 2 mg via INTRAVENOUS
  Filled 2016-12-03 (×19): qty 2

## 2016-12-03 NOTE — Progress Notes (Signed)
Patient came to MRI and during transition to our scanning table patient started moving and twisting around.  MD came down to evaluate patient RN was told to take patient back upstairs and get Chest Xray to check tube placement. MRI not completed.

## 2016-12-03 NOTE — Progress Notes (Signed)
S: Paged by RN that patient currently attempting to self extubate. He became agitated during suctioning while on fentanyl drip. RN was holding off on increasing sedation due to bradycardia earlier today.   O: Now calm and sedated on additional propofol drip and prn versed. ET tube re-advanced per RT.   Vitals:   12/03/16 1600 12/03/16 1633 12/03/16 1700 12/03/16 1800  BP:   136/87 132/81  Pulse: 93 82 74 69  Resp: 20  15 16   Temp:      TempSrc:      SpO2: 97% 99% 100% 99%  Weight:      Height:        Physical Exam Constitutional: NAD, appears comfortable HEENT: Endotracheally intubated   Cardiovascular: Tachycardic but regular rhythm, no murmurs, rubs, or gallops.  Pulmonary/Chest: CTAB, no wheezes, rales, or rhonchi.  Neurological: Sedated on vent, RASS -4  A: 51 yo M with HIV/AIDs, COPD, cocaine abuse, admitted for cryptococcal meningitis, agitated on vent and attempted self extubation - now sedated and in restraints.   Plan: -- Continue fentanyl drip -- Added propofol, prn versed  -- Soft wrist restraints  -- Repeat CXR to check ET tube placement  -- Bradycardia now resolved; checking EKG & troponin. Ectrolytes within normal limits.  Lawrence Kane, M.D. Pager: [phone] 12/03/2016, 8:16 PM

## 2016-12-03 NOTE — Care Management Note (Signed)
Case Management Note  Patient Details  Name: Lawrence Kane MRN: [id number] Date of Birth: 05-28-1966  Subjective/Objective:     Pt admitted with bacterial menegitis               Action/Plan: PTA from home - pt remains ventilated. Bacterial meningitis, possible parasitic given elevated eosinophils, also would consider coccidiodes. Possible LLL PNA, HIV > CD4 10  . Plan is to keep pt on full support until Monday 12/06/16 to allow for possible recovery - tentative GOC meeting scheduled Monday.  Mom and sister are POC.  CM will continue to follow for discharge needs   Expected Discharge Date:                  Expected Discharge Plan:     In-House Referral:  Clinical Social Work  Discharge planning Services  CM Consult  Post Acute Care Choice:    Choice offered to:     DME Arranged:    DME Agency:     HH Arranged:    HH Agency:     Status of Service:  In process, will continue to follow  If discussed at Long Length of Stay Meetings, dates discussed:    Additional Comments:  Lawrence Kane, Lawrence Lisenby S, RN 12/03/2016, 10:44 AM

## 2016-12-03 NOTE — Progress Notes (Signed)
RN transported patient to MRI with transport, nursing student, and RT at bedside. In transporting patient from bed to MRI bed, patient became combative and extremely agitated with maxed sedation, Fentanyl at 300. ETT pulled back by patient several cm and sats at 73%. Held patient down with MRI staff and gave fentanyl bolus. MD paged and called to bedside. Orders to return to room for Xray to ensure correct ETT placement and will plan for MRI later.

## 2016-12-03 NOTE — Progress Notes (Addendum)
    [facility]   Reason for visit: Follow up on meningitis  Interval History: minimal response, repeat LP done and still with eosinophilia; no history obtainable from the pt  CT independently reviewed and no hydrocephalus  Physical Exam: Constitutional:  Vitals:   12/03/16 0821 12/03/16 0900  BP: (!) 146/90 (!) 147/91  Pulse:  74  Resp:  16  Temp:    intubated, on sedation Eyes: anicteric HENT: +ET Respiratory: on vent; coarse breath sounds Cardiovascular: tachy RR GI: soft, nt, nd  Review of Systems: Unable to be assessed due to mental status  Lab Results  Component Value Date   WBC 12.5 (H) 12/03/2016   HGB 8.0 (L) 12/03/2016   HCT 25.0 (L) 12/03/2016   MCV 96.9 12/03/2016   PLT 218 12/03/2016    Lab Results  Component Value Date   CREATININE 1.04 12/03/2016   BUN 19 12/03/2016   NA 136 12/03/2016   K 4.5 12/03/2016   CL 107 12/03/2016   CO2 25 12/03/2016    Lab Results  Component Value Date   ALT 149 (H) 12/03/2016   AST 128 (H) 12/03/2016   ALKPHOS 39 12/03/2016     Microbiology: Recent Results (from the past 240 hour(s))  MRSA PCR Screening     Status: Abnormal   Collection Time: 12/01/16 10:26 PM  Result Value Ref Range Status   MRSA by PCR POSITIVE (A) NEGATIVE Final    Comment:        The GeneXpert MRSA Assay (FDA approved for NASAL specimens only), is one component of a comprehensive MRSA colonization surveillance program. It is not intended to diagnose MRSA infection nor to guide or monitor treatment for MRSA infections. RESULT CALLED TO, READ BACK BY AND VERIFIED WITH: R DONAHUE,RN @0326  12/02/16 MKELLY,MLT   Culture, respiratory (tracheal aspirate)     Status: None (Preliminary result)   Collection Time: 12/01/16 11:05 PM  Result Value Ref Range Status   Specimen Description TRACHEAL ASPIRATE  Final   Special Requests NONE  Final   Gram Stain   Final    FEW WBC PRESENT,BOTH PMN AND MONONUCLEAR NO  ORGANISMS SEEN    Culture PENDING  Incomplete   Report Status PENDING  Incomplete  CSF culture     Status: None (Preliminary result)   Collection Time: 12/02/16  2:24 PM  Result Value Ref Range Status   Specimen Description CSF  Final   Special Requests NONE  Final   Gram Stain   Final    CYTOSPIN SMEAR WBC PRESENT,BOTH PMN AND MONONUCLEAR NO ORGANISMS SEEN    Culture PENDING  Incomplete   Report Status PENDING  Incomplete    Impression/Plan:  1. Meningitis - unclear etiology.  No positive culture.  Improved cell count down to under 1000 from over 4000.  Glucose in CSF improved some.   Can stop steroids Can d/c acyclovir Have lab spin CSF and check for parasites Continue vancomycin, ceftriaxone, ampicillin No sign of toxo on CT Agree with MRI   2.  HIV - reported last CD4 of around 200 at [facility], 20 here.  May be falsely low due to steroids. On ARVs  3.  Eosinophilia - unclear etiology.  Clinically like bacterial meningitis.  As above, will check for parasites.   4.  Respiratory failure - remains intubated  Dr. Drue SecondSnider will follow up over the weekend

## 2016-12-03 NOTE — Progress Notes (Signed)
I called and spoke to his mother about his status. Discussed with them that he seems to be responding to the antibiotics by the numbers. Also told them that he does get agitated when sedation decreases but nonpurposeful movements. Family agreeable to touching base again on Monday to discuss progress and further goals of care.   Griffin BasilJennifer Krall, MD  Internal Medicine PGY-3 Pager: [phone]

## 2016-12-03 NOTE — Progress Notes (Signed)
PULMONARY / CRITICAL CARE MEDICINE   Name: Lawrence Kane MRN: [id number] DOB: Jul 16, 1966    ADMISSION DATE:  12/01/2016 CONSULTATION DATE:  12/01/2016  REFERRING MD:  [name] transfer  CHIEF COMPLAINT:  AMS  HISTORY OF PRESENT ILLNESS:   51 year old man with HIV/AIDS (first diagnosed 2003, last CD4 count 245 with viremia of 1,160 in October 2017; on Genovya, Darunavir and Bactrim), Genital Warts, COPD, and cocaine substance abuse who presented to [facility] on 1/24 with complaint of altered mental status. Per notes, patient may have recently had some type of surgery in [place] where his mother resides. Afebrile, tachycardic to 155, RR 18, hypertensive to 240/112. Patient was intubated for airway protection, CVL in IJ was placed, and LP was performed given his altered mental status. He was started on Versed and propofol for sedation. Initial labs showed leukocytosis of 13.9, hemoglobin 10.9, hct 33.6 and plts 269. BMET showed Sodium 139, K 3.9, Cl 101, Bicarb 21, BUN 23, Creatine 1.10, glucose 163. ABG showed pH of 7.28, pCO2 48, pO2 158, on FiO2 of 40%, PEEP 5, TV 450 and RR 18. Lactic Acid 3.3, 6.9. AST 125, ALT 224, and Alk Phos 88. UA negative for infection. UDS positive for cocaine. CSF appeared yellow, cloudy, xanthochromic, WBC 4845, RBC 625, glucose <20, total protein >300, Neutrophils 16%, Eos 64%, monocytes 17%, lymphocytes 3%. Gram stain performed, but unable to view results. ID was consulted. Dr. Baxter Flattery recommended continuing Truvada and Tivicay through NGT  SUBJECTIVE:  Remains intubated. Partial eye opening to tactile stimulation.  VITAL SIGNS: BP (!) 146/90   Pulse 75   Temp 99.6 F (37.6 C) (Oral)   Resp 16   Ht 6' 4"  (1.93 m)   Wt 239 lb 3.2 oz (108.5 kg)   SpO2 100%   BMI 29.12 kg/m   HEMODYNAMICS:    VENTILATOR SETTINGS: Vent Mode: PRVC FiO2 (%):  [30 %-40 %] 40 % Set Rate:  [16 bmp] 16 bmp Vt Set:  [620 mL] 620 mL PEEP:  [5 cmH20] 5 cmH20 Plateau  Pressure:  [13 cmH20-16 cmH20] 13 cmH20  INTAKE / OUTPUT: I/O last 3 completed shifts: In: 5956.6 [I.V.:3618.8; NG/GT:60; IV Piggyback:2277.8] Out: 0355 [Urine:4250; Emesis/NG output:225]  PHYSICAL EXAMINATION: General:  NAD Neuro:  Sedated HEENT:  [place]/AT, condyloma noted around mouth Cardiovascular:  Tachycardic Lungs:  [place] Abdomen:  Soft, mildly distended Musculoskeletal:  No LE edema Skin:  Scrotal wound  LABS:  BMET  Recent Labs Lab 12/02/16 0009 12/02/16 0530 12/03/16 0425  NA 135 136 136  K 4.4 4.2 4.5  CL 105 106 107  CO2 22 21* 25  BUN 18 15 19   CREATININE 1.00 0.98 1.04  GLUCOSE 329* 307* 236*    Electrolytes  Recent Labs Lab 12/02/16 0009 12/02/16 0530 12/03/16 0425  CALCIUM 8.2* 8.0* 8.1*  MG  --  2.3 2.2  PHOS  --  2.2* 3.1    CBC  Recent Labs Lab 12/02/16 0009 12/02/16 0530 12/03/16 0425  WBC 20.1* 17.2* 12.5*  HGB 9.0* 8.5* 8.0*  HCT 28.2* 26.1* 25.0*  PLT 229 218 218    Coag's No results for input(s): APTT, INR in the last 168 hours.  Sepsis Markers  Recent Labs Lab 12/02/16 0009 12/02/16 0530  LATICACIDVEN 1.3 1.3    ABG  Recent Labs Lab 12/02/16 1114  PHART 7.464*  PCO2ART 33.5  PO2ART 131.0*    Liver Enzymes  Recent Labs Lab 12/02/16 0009 12/03/16 0425  AST 100* 128*  ALT 146*  149*  ALKPHOS 46 39  BILITOT 0.9 0.6  ALBUMIN 2.4* 2.0*    Cardiac Enzymes No results for input(s): TROPONINI, PROBNP in the last 168 hours.  Glucose  Recent Labs Lab 12/02/16 0816 12/02/16 1152 12/02/16 1658 12/02/16 1943 12/02/16 2332 12/03/16 0345  GLUCAP 278* 223* 238* 286* 237* 209*    Imaging Ct Head Wo Contrast  Result Date: 12/02/2016 CLINICAL DATA:  Hx of encephalopathy due to HIV/AIDS, COPD, acute Bacterial Meningitis; Pt intubated at this time EXAM: CT HEAD WITHOUT CONTRAST TECHNIQUE: Contiguous axial images were obtained from the base of the skull through the vertex without intravenous contrast.  COMPARISON:  08/27/2015 FINDINGS: Brain: No evidence of acute infarction, hemorrhage, hydrocephalus, extra-axial collection or mass lesion/mass effect. Vascular: No hyperdense vessel or unexpected calcification. Skull: Normal. Negative for fracture or focal lesion. Sinuses/Orbits: Layering debris in the sphenoid sinus. Mild mucoperiosteal thickening and debris in bilateral maxillary sinuses. Mastoid air cells are aerated. Orbits unremarkable. Other: None. IMPRESSION: 1. Negative for bleed or other acute intracranial process. 2. Mild bilateral maxillary and sphenoid sinus disease. Electronically Signed   By: Lucrezia Europe M.D.   On: 12/02/2016 11:49     STUDIES:  CXR 1/24 >> ATX vs LLL infiltrate CT head 1/25 >> neg for acute intracranial process LP cytology 1/25 >> CSF crypto ag >> neg LP >> yellow, cloudy, glucose 20, total protein >600, RBC 114>49, WBC 970, 36% neutrophil, 20% lymphs, 19% eos HIV quant >>  CD4 10 HCV ab +  CULTURES: 1/24 BCx x 2 ([place]): 1/24 HSV PCR [name]): 1/24 Cryptococcal [name]): 1/24 CSF [name]): 1/25 BCx x2 1/25: 1/24 Trach asp 1/24:  1/24 MRSA PCR:  1/25 CSF AFB: 1/25 fungal cx CSF: 1/25 gram stain no organisms, cx >>  ANTIBIOTICS: Rocephin 1/24>> Vancomycin 1/24>> Acyclovir 1/24>> Ampicillin 1/24>> Bactrim M/W/F for PCP prophylaxis  SIGNIFICANT EVENTS: 1/24> Transferred from [facility] 1/25> Underwent LP here  LINES/TUBES: L IJ CVL 1/24 >> ETT 1/24 >> Foley 1/24 >> OGT 1/24 >>  DISCUSSION: 51 year old man with HIV/AIDS (first diagnosed 2003, last CD4 count 245 with viremia of 1,160 in October 2017; on Genovya, Darunavir and Bactrim), Genital Warts, COPD, and cocaine substance abuse who presented to [facility] on 1/24 with altered mental status. Intubated for airway protection.   ASSESSMENT / PLAN:  PULMONARY A: Acute Respiratory Failure 2/2 Underlying Sepsis and Meningitis- Intubated for airway protection Possible LLL  PNA P:   Full vent support Abx as below  CARDIOVASCULAR A:  Sinus Tachycardia > resolved Incomplete RBBB HTN P:  Hydralazine 10 mg Q6H prn SBP >180 and/or DBP >110  RENAL A:   Lactic Acidosis, resolved P:   D/c NS 100 cc/hr  Follow BMP  GASTROINTESTINAL A:   Transaminitis P:   NPO Famotidine 20 mg IV BID Hep B core antibody, surface ab, surface ag > consistent with unvaccinated status Hep C antibody reflex > +, will check viral load  HEMATOLOGIC A:   Mild leukocytosis > improving Mild anemia P:  subq hep for VTE ppx  INFECTIOUS A:   Bacterial meningitis, possible parasitic given elevated eosinophils, also would consider coccidiodes Possible LLL PNA HIV > CD4 10  Open Wound of Scrotum Hep B core antibody, surface ab, surface ag > consistent with unvaccinated status Hep C antibody reflex > +, will check viral load P:   Toxoplasma pending M. Pneumonia pending Continue antiretrovirals per ID Ceftriaxone 1/24 >> Vancomycin 1/24 >> Ampicillin given age >60 >> Acyclovir 1/24 for scrotal lesions >>  Dexamethasone 15 mg Q6H 1/24 >> (discontinue if CSF gram stain and Cx are not S. Pneumo) Bactrim MWF ppx  ENDOCRINE A:   Hyperglycemia   P:   SSI-R CBGs Q4H  NEUROLOGIC A:   Metabolic Encephalopathy History of Cocaine Abuse History of Personality Disorder  P:   Continue fentanyl gtt RASS goal: 0 to -1   FAMILY  - Updates: Mother, sister updated via phone 1/26  - Inter-disciplinary family meet or Palliative Care meeting due by:  1/31    Jacques Earthly, MD  Internal Medicine PGY-3 Pager: [phone] If no answer or after 3PM: [phone]  12/03/2016, 8:31 AM

## 2016-12-03 NOTE — Progress Notes (Addendum)
Initial Nutrition Assessment  DOCUMENTATION CODES:   Not applicable  INTERVENTION:    Initiate TF via OGT with Vital AF 1.2 at goal rate of 75 ml/h (1800 ml per day) to provide 2160 kcals, 135 gm protein, 1460 ml free water daily.  NUTRITION DIAGNOSIS:   Inadequate oral intake related to inability to eat as evidenced by NPO status.  Ongoing  GOAL:   Patient will meet greater than or equal to 90% of their needs   Progressing  MONITOR:   Vent status, Labs, I & O's  REASON FOR ASSESSMENT:   Rounds (RD to order TF)    ASSESSMENT:   51 year old man with HIV/AIDS (first diagnosed 2003, last CD4 count 245 with viremia of 1,160 in October 2017; on Genovya, Darunavir and Bactrim), Genital Warts, COPD, and cocaine substance abuse who presented to [facility] on 1/24 with altered mental status. Intubated for airway protection and transferred to [facility].  Discussed patient in ICU rounds and with RN today. LP c/w meningitis, suspected bacterial. Plans to continue full support for now. If no improvement over next few days, will need to readdress goals of care. RD to start TF. Patient is currently intubated on ventilator support MV: 12.9 L/min Temp (24hrs), Avg:99.8 F (37.7 C), Min:98.8 F (37.1 C), Max:101.2 F (38.4 C)  Propofol: off Labs and medications reviewed.  Diet Order:  Diet NPO time specified  Skin:  Wound (see comment) (non pressure wound to scrotum)  Last BM:  unknown  Height:   Ht Readings from Last 1 Encounters:  12/01/16 6\' 4"  (1.93 m)    Weight:   Wt Readings from Last 1 Encounters:  12/03/16 239 lb 3.2 oz (108.5 kg)   12/01/16 205 lb (93 kg)     BMI=25    Ideal Body Weight:  91.8 kg  BMI:  Body mass index is 29.12 kg/m.  Estimated Nutritional Needs:   Kcal:  2250  Protein:  120-140 gm  Fluid:  2.3 L  EDUCATION NEEDS:   No education needs identified at this time  Joaquin CourtsKimberly Harris, RD, LDN, CNSC Pager [phone] After Hours  Pager [phone]

## 2016-12-04 ENCOUNTER — Inpatient Hospital Stay (HOSPITAL_COMMUNITY): Payer: Medicaid Other

## 2016-12-04 LAB — CBC
HCT: 24.5 % — ABNORMAL LOW (ref 39.0–52.0)
Hemoglobin: 7.8 g/dL — ABNORMAL LOW (ref 13.0–17.0)
MCH: 31.2 pg (ref 26.0–34.0)
MCHC: 31.8 g/dL (ref 30.0–36.0)
MCV: 98 fL (ref 78.0–100.0)
PLATELETS: 201 10*3/uL (ref 150–400)
RBC: 2.5 MIL/uL — ABNORMAL LOW (ref 4.22–5.81)
RDW: 17 % — AB (ref 11.5–15.5)
WBC: 10.4 10*3/uL (ref 4.0–10.5)

## 2016-12-04 LAB — COMPREHENSIVE METABOLIC PANEL
ALT: 173 U/L — ABNORMAL HIGH (ref 17–63)
AST: 150 U/L — AB (ref 15–41)
Albumin: 2 g/dL — ABNORMAL LOW (ref 3.5–5.0)
Alkaline Phosphatase: 38 U/L (ref 38–126)
Anion gap: 9 (ref 5–15)
BILIRUBIN TOTAL: 0.4 mg/dL (ref 0.3–1.2)
BUN: 30 mg/dL — AB (ref 6–20)
CO2: 25 mmol/L (ref 22–32)
Calcium: 8.4 mg/dL — ABNORMAL LOW (ref 8.9–10.3)
Chloride: 104 mmol/L (ref 101–111)
Creatinine, Ser: 1.09 mg/dL (ref 0.61–1.24)
Glucose, Bld: 207 mg/dL — ABNORMAL HIGH (ref 65–99)
POTASSIUM: 4.9 mmol/L (ref 3.5–5.1)
Sodium: 138 mmol/L (ref 135–145)
TOTAL PROTEIN: 6.2 g/dL — AB (ref 6.5–8.1)

## 2016-12-04 LAB — MAGNESIUM: MAGNESIUM: 2.3 mg/dL (ref 1.7–2.4)

## 2016-12-04 LAB — GLUCOSE, CAPILLARY
GLUCOSE-CAPILLARY: 211 mg/dL — AB (ref 65–99)
GLUCOSE-CAPILLARY: 172 mg/dL — AB (ref 65–99)
Glucose-Capillary: 240 mg/dL — ABNORMAL HIGH (ref 65–99)
Glucose-Capillary: 253 mg/dL — ABNORMAL HIGH (ref 65–99)
Glucose-Capillary: 279 mg/dL — ABNORMAL HIGH (ref 65–99)
Glucose-Capillary: 304 mg/dL — ABNORMAL HIGH (ref 65–99)

## 2016-12-04 LAB — VANCOMYCIN, TROUGH: Vancomycin Tr: 12 ug/mL — ABNORMAL LOW (ref 15–20)

## 2016-12-04 LAB — TROPONIN I
TROPONIN I: 0.04 ng/mL — AB (ref <0.03)
Troponin I: <0.03 ng/mL (ref <0.03)

## 2016-12-04 LAB — CULTURE, RESPIRATORY

## 2016-12-04 LAB — HEMOGLOBIN A1C
HEMOGLOBIN A1C: 6.6 % — AB (ref 4.8–5.6)
MEAN PLASMA GLUCOSE: 143 mg/dL

## 2016-12-04 LAB — CULTURE, RESPIRATORY W GRAM STAIN: Culture: NORMAL

## 2016-12-04 LAB — PHOSPHORUS: Phosphorus: 4.3 mg/dL (ref 2.5–4.6)

## 2016-12-04 MED ORDER — GADOBENATE DIMEGLUMINE 529 MG/ML IV SOLN
15.0000 mL | Freq: Once | INTRAVENOUS | Status: AC | PRN
  Administered 2016-12-04: 15 mL via INTRAVENOUS

## 2016-12-04 MED ORDER — ROCURONIUM BROMIDE 50 MG/5ML IV SOLN
100.0000 mg | Freq: Once | INTRAVENOUS | Status: AC | PRN
  Administered 2016-12-04: 100 mg via INTRAVENOUS
  Filled 2016-12-04 (×2): qty 10

## 2016-12-04 MED ORDER — QUETIAPINE FUMARATE 50 MG PO TABS
50.0000 mg | ORAL_TABLET | Freq: Two times a day (BID) | ORAL | Status: DC
  Administered 2016-12-04 – 2016-12-06 (×6): 50 mg
  Filled 2016-12-04 (×8): qty 1

## 2016-12-04 MED ORDER — VANCOMYCIN HCL 10 G IV SOLR
1250.0000 mg | Freq: Three times a day (TID) | INTRAVENOUS | Status: DC
  Administered 2016-12-04 – 2016-12-07 (×8): 1250 mg via INTRAVENOUS
  Filled 2016-12-04 (×9): qty 1250

## 2016-12-04 MED ORDER — INSULIN ASPART 100 UNIT/ML ~~LOC~~ SOLN
5.0000 [IU] | SUBCUTANEOUS | Status: DC
  Administered 2016-12-04 – 2016-12-06 (×15): 5 [IU] via SUBCUTANEOUS

## 2016-12-04 MED ORDER — DEXTROSE 5 % IV SOLN
1000.0000 mg | Freq: Three times a day (TID) | INTRAVENOUS | Status: DC
  Administered 2016-12-04 – 2016-12-12 (×25): 1000 mg via INTRAVENOUS
  Filled 2016-12-04 (×28): qty 20

## 2016-12-04 NOTE — Progress Notes (Addendum)
[facility]    Date of Admission:  12/01/2016   Total days of antibiotics 4        Day 4 amp        Day 4 ceftriaxone        Day 4 vanco   ID: Lawrence Kane is a 51 y.o. 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 Active Problems:   COPD (chronic obstructive pulmonary disease) ([place])   Substance abuse   Genital warts   HIV disease ([place])   AIDS ([place])   Noncompliance   Bacterial meningitis   Hypertensive urgency   Septic shock ([place])   Transaminitis   Cocaine abuse   Open wound   Acute respiratory failure with hypoxia ([place])   Altered mental status    Subjective: Became agitated during suctioning, also had increase awareness /movement with attempted self-extubation when attempted to do MRI imaging  Afebrile  Update from OSH Micro: 1) 1/22 blood cx NGTD 2) 1/22 scrotal wound cx MRSA 3) 1/24 CSF cx NGTD 4) 1/24 blood cx NGTD 5) CSF HSV PCR HSV-2 POSITIVE 6) crypto negative 7) cd 4 count  248(17%) 8) VL 90 viral copies 9) toxoplasma negative  Medications:  . cefTRIAXone (ROCEPHIN)  IV  2 g Intravenous Q12H  . chlorhexidine gluconate (MEDLINE KIT)  15 mL Mouth Rinse BID  . darunavir-cobicistat  1 tablet Oral Q breakfast  . dolutegravir  50 mg Oral Daily  . emtricitabine-tenofovir AF  1 tablet Per Tube Daily  . famotidine  20 mg Per Tube BID  . fentaNYL (SUBLIMAZE) injection  50 mcg Intravenous Once  . heparin  5,000 Units Subcutaneous Q8H  . insulin aspart  0-20 Units Subcutaneous Q4H  . insulin aspart  5 Units Subcutaneous Q4H  . insulin detemir  15 Units Subcutaneous BID  . mouth rinse  15 mL Mouth Rinse 10 times per day  . sulfamethoxazole-trimethoprim  20 mL Per Tube Once per day on Mon Wed Fri  . vancomycin  1,000 mg Intravenous Q8H     Objective: Vital signs in last 24 hours: Temp:  [97.9 F (36.6 C)-98.9 F (37.2 C)] 98.8 F (37.1 C) (01/27 0831) Pulse Rate:  [39-93] 62 (01/27 0805) Resp:  [14-20] 16 (01/27 0805) BP: (103-148)/(59-95) 123/70 (01/27 0805) SpO2:  [62 %-100 %] 100 % (01/27 0805) FiO2 (%):  [40 %] 40 % (01/27 0805) Weight:  [238 lb 5.1 oz (108.1 kg)] 238 lb 5.1 oz (108.1 kg) (01/27 0204) Physical Exam  Constitutional: intubated, sedated He appears well-developed and well-nourished. No distress.  HENT:  Mouth/Throat: OETT  Cardiovascular: Normal rate, regular rhythm and normal heart sounds. Exam reveals no gallop and no friction rub.  No murmur heard.  Pulmonary/Chest: Effort normal and breath sounds normal. No respiratory distress. He has no wheezes.  Abdominal: Soft. Bowel sounds are decreased. He exhibits no distension. There is no tenderness.  Skin: large GU wound has some whitish exudate in wound bed   Lab Results  Recent Labs  12/03/16 0425 12/04/16 0211 12/04/16 0459  WBC 12.5*  --  10.4  HGB 8.0*  --  7.8*  HCT 25.0*  --  24.5*  NA 136 138  --   K 4.5 4.9  --   CL 107 104  --   CO2  25 25  --   BUN 19 30*  --   CREATININE 1.04 1.09  --    Liver Panel  Recent Labs  12/03/16 0425 12/04/16 0211  PROT 6.3* 6.2*  ALBUMIN 2.0* 2.0*  AST 128* 150*  ALT 149* 173*  ALKPHOS 39 38  BILITOT 0.6 0.4    Microbiology: 1/25 blood cx NGTD 1/25 csf cx NGTD 1/25 csf fungal cx pending 1/25 csf afb cx pending/ NO AFB  Studies/Results: cxr per my review has increased mild atelectesis  Ct Head Wo Contrast  Result Date: 12/02/2016 CLINICAL DATA:  Hx of encephalopathy due to HIV/AIDS, COPD, acute Bacterial Meningitis; Pt intubated at this time EXAM: CT HEAD WITHOUT CONTRAST TECHNIQUE: Contiguous axial images were obtained from the base of the skull through the vertex without intravenous contrast. COMPARISON:  08/27/2015 FINDINGS: Brain: No evidence of acute infarction, hemorrhage,  hydrocephalus, extra-axial collection or mass lesion/mass effect. Vascular: No hyperdense vessel or unexpected calcification. Skull: Normal. Negative for fracture or focal lesion. Sinuses/Orbits: Layering debris in the sphenoid sinus. Mild mucoperiosteal thickening and debris in bilateral maxillary sinuses. Mastoid air cells are aerated. Orbits unremarkable. Other: None. IMPRESSION: 1. Negative for bleed or other acute intracranial process. 2. Mild bilateral maxillary and sphenoid sinus disease. Electronically Signed   By: Lucrezia Europe M.D.   On: 12/02/2016 11:49   Dg Chest Port 1 View  Result Date: 12/04/2016 CLINICAL DATA:  Endotracheal tube EXAM: PORTABLE CHEST 1 VIEW COMPARISON:  12/03/2016 FINDINGS: Endotracheal tube in good position. Left jugular central venous catheter tip in the left innominate vein unchanged. NG tube in the stomach. Mild progression of bibasilar airspace disease. No edema or effusion. IMPRESSION: Endotracheal tube in good position Mild progression of bibasilar atelectasis. Electronically Signed   By: Franchot Gallo M.D.   On: 12/04/2016 07:11   Dg Chest Port 1 View  Result Date: 12/03/2016 CLINICAL DATA:  Endotracheal tube placement.  Initial encounter. EXAM: PORTABLE CHEST 1 VIEW COMPARISON:  Chest radiograph performed earlier today at 5:12 p.m. FINDINGS: The patient's endotracheal tube is seen ending 8 cm above the carina. An enteric tube is noted extending below the diaphragm. A left IJ line is noted ending about the left brachiocephalic vein. Mild bibasilar airspace opacities likely reflect atelectasis. No pleural effusion or pneumothorax is seen. The cardiomediastinal silhouette is normal in size. No acute osseous abnormalities are identified. IMPRESSION: 1. Endotracheal tube seen ending 8 cm above the carina. This could be advanced 4-5 cm. 2. Mild bibasilar airspace opacities likely reflect atelectasis. Electronically Signed   By: Garald Balding M.D.   On: 12/03/2016 20:39    Dg Chest Port 1 View  Result Date: 12/03/2016 CLINICAL DATA:  Endotracheal tube repositioning with further advancement EXAM: PORTABLE CHEST 1 VIEW COMPARISON:  None. FINDINGS: The tip of an endotracheal tube is 7 cm above the carina at the level of the clavicular heads in the upper limits of normal. Mild interstitial edema. Normal size cardiac silhouette. No aortic aneurysm. No pneumothorax. Left IJ central line catheter is unchanged in position with tip projecting over the left medial clavicular head. IMPRESSION: 1. Endotracheal tube tip is 7 cm above the carina. No significant change left IJ central line catheter with tip projecting the medial left clavicular head as before. 2. Slight increase in interstitial edema since prior exam. No pneumothorax. Electronically Signed   By: Ashley Royalty M.D.   On: 12/03/2016 17:33   Dg Chest Port 1 View  Result Date: 12/03/2016 CLINICAL DATA:  Advanced endotracheal tube EXAM: PORTABLE CHEST 1 VIEW COMPARISON:  Earlier today FINDINGS: Endotracheal tube tip in good position at the clavicular heads. An orogastric tube at least reaches the diaphragm. Left IJ central line with tip left of midline at the level of the left clavicular head. Arterial and venous structures overlap in this location. Normal heart size and mediastinal contours. Mild interstitial coarsening at the bases is stable. No edema, visible effusion, or pneumothorax. IMPRESSION: 1. Improved endotracheal tube positioning, tip at the clavicular heads. 2. Elsewhere stable chest. Electronically Signed   By: Monte Fantasia M.D.   On: 12/03/2016 16:18   Dg Chest Port 1 View  Result Date: 12/03/2016 CLINICAL DATA:  Endotracheal tube adjustment.  Hypoxia. EXAM: PORTABLE CHEST 1 VIEW COMPARISON:  12/03/2016 at 3:30 a.m. FINDINGS: Endotracheal tube has been pulled back as tip is now 13.8 cm above the carina. This could be advanced another 7 cm. Nasogastric tube appears to also been pulled back with tip over the  distal esophagus. Left IJ central venous catheter unchanged with tip in the region of the brachiocephalic vein. Lungs are adequately inflated with stable mild hazy left base opacification which may be due to atelectasis/effusion or infection. Cardiomediastinal silhouette and remainder the exam is unchanged. IMPRESSION: Stable hazy left base opacification which may be due to atelectasis/ effusion versus infection. Tubes and lines as described. Note that the endotracheal tube and nasogastric tubes have been pulled back as described. Findings discussed with patient's nurse, Tamala Fothergill, at the time of dictation. Electronically Signed   By: Marin Olp M.D.   On: 12/03/2016 16:02   Dg Chest Port 1 View  Result Date: 12/03/2016 CLINICAL DATA:  Check endotracheal tube EXAM: PORTABLE CHEST 1 VIEW COMPARISON:  12/02/2016 FINDINGS: Cardiac shadow is stable. Endotracheal tube and nasogastric catheter are again seen and stable. Increasing left basilar density is noted consistent with evolving infiltrate no other focal infiltrate is seen. No sizable effusion is noted. No bony abnormality is seen. IMPRESSION: Evolving left basilar infiltrate. Electronically Signed   By: Inez Catalina M.D.   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 Differential is limited but differential includes strongy, coccidiodes, possible drug induced - will add acyclovir - still recommend to have brain MRI and may need temporary paralytics - continue on bacterial meningitis coverage, will d/c ampicillin.  - will check strongyloides antibody and coccidiomycosis ab - can stop droplet precautions since this is not meningococcal meningitis  - recommend to get BAL to send for cytology and testing for strongyloides, based on further lab results/mri findings, will consider starting ivermectin @ 245mg/kg. For now no need for  empirically starting it today.  - larvae can be seen by a simple wet-mount in fluid from a bronchoalveolar lavage (BAL).  transaminitis = trending up. Unclear if these are near his baseline from his hep C. Could it be related to HSV 2. He is currently descovy which would treat risk for hep B flare. Based upon this VL on admit, it suggests he was taking his medications. Will check hep C viral load/genotype once he is stable.  Continue to follow LFTs  Chronic hep b = continue tx with descovy  Scrotal wound = continue with wound care recs. Vancomycin will cover MRSA wound cx from OSH  hiv disease= continue on current regimen plus oi proph with bactrim, though CD 4 count on day of admit  suggests that his baseline is in the 250-300. CD 4 count of 10 here is likely acute change in the setting of fighting off infection  Plan discussed with dr mannan and lab  Spent 45 min with patient in coordination of care.  Baxter Flattery [facility] Cell: [phone] Pager: [phone]  12/04/2016, 9:42 AM

## 2016-12-04 NOTE — Progress Notes (Signed)
Pharmacy Antibiotic Note  Lawrence Kane is a 51 y.o. male admitted to [facility] on 12/01/2016 with c/o AMS. MRSA scrotal wound  VT = 12  Plan: Increase vanc to 1250 q8h   Height: 6\' 4"  (193 cm) Weight: 238 lb 5.1 oz (108.1 kg) IBW/kg (Calculated) : 86.8  Temp (24hrs), Avg:98.7 F (37.1 C), Min:98.2 F (36.8 C), Max:99 F (37.2 C)   Recent Labs Lab 11/28/16 2205 12/02/16 0009 12/02/16 0530 12/03/16 0425 12/04/16 0211 12/04/16 0459 12/04/16 1300  WBC 10.9* 20.1* 17.2* 12.5*  --  10.4  --   CREATININE 1.74* 1.00 0.98 1.04 1.09  --   --   LATICACIDVEN  --  1.3 1.3  --   --   --   --   VANCOTROUGH  --   --   --   --   --   --  12*    Estimated Creatinine Clearance: 109.3 mL/min (by C-G formula based on SCr of 1.09 mg/dL).    Allergies  Allergen Reactions  . Iodine   . Ketorolac     unknown  . Tylenol [Acetaminophen]     unknown    Antimicrobials this admission: Ceftriaxone 1/24 (started at OSH) >> Vancomycin 1/24 (started at OSH) >> Ampicillin 1/25 >> Acyclovir 1/25 >> Bactrim 1/25 (PCP prophylaxis) >>  Dose adjustments this admission: n/a  Microbiology results: 1/24 BCx x 2 Duke Salvia([place]): 1/24 HSV PCR Duke Salvia([place]): 1/24 Cryptococcal Duke Salvia([place]): 1/24 CSF Duke Salvia(): 1/25 BCx x2: 1/24 Trach asp:  1/24 MRSA PCR:   Lawrence Kane Lawrence Kane, PharmD, BCPS, BCCCP Clinical Pharmacist Clinical phone for 12/04/2016 from 7a-3:30p: [phone] If after 3:30p, please call main pharmacy at: [phone] 12/04/2016 2:21 PM

## 2016-12-04 NOTE — Progress Notes (Addendum)
PULMONARY / CRITICAL CARE MEDICINE   Name: Lawrence Kane MRN: [id number] DOB: July 28, 1966    ADMISSION DATE:  12/01/2016 CONSULTATION DATE:  12/01/2016  REFERRING MD:  [name] transfer  CHIEF COMPLAINT:  AMS  HISTORY OF PRESENT ILLNESS:   51 year old man with HIV/AIDS (first diagnosed 2003, last CD4 count 245 with viremia of 1,160 in October 2017; on Genovya, Darunavir and Bactrim), Genital Warts, COPD, and cocaine substance abuse who presented to [facility] on 1/24 with complaint of altered mental status. Per notes, patient may have recently had some type of surgery in [place] where his mother resides. Afebrile, tachycardic to 155, RR 18, hypertensive to 240/112. Patient was intubated for airway protection, CVL in IJ was placed, and LP was performed given his altered mental status. He was started on Versed and propofol for sedation. Initial labs showed leukocytosis of 13.9, hemoglobin 10.9, hct 33.6 and plts 269. BMET showed Sodium 139, K 3.9, Cl 101, Bicarb 21, BUN 23, Creatine 1.10, glucose 163. ABG showed pH of 7.28, pCO2 48, pO2 158, on FiO2 of 40%, PEEP 5, TV 450 and RR 18. Lactic Acid 3.3, 6.9. AST 125, ALT 224, and Alk Phos 88. UA negative for infection. UDS positive for cocaine. CSF appeared yellow, cloudy, xanthochromic, WBC 4845, RBC 625, glucose <20, total protein >300, Neutrophils 16%, Eos 64%, monocytes 17%, lymphocytes 3%. Gram stain performed, but unable to view results. ID was consulted. Dr. Baxter Flattery recommended continuing Truvada and Tivicay through NGT  SUBJECTIVE:  Remains intubated, agitated Self extubated at MRI  VITAL SIGNS: BP 123/70   Pulse 62   Temp 98.8 F (37.1 C) (Oral)   Resp 16   Ht _0  (1.93 m)   Wt 238 lb 5.1 oz (108.1 kg)   SpO2 100%   BMI 29.01 kg/m   HEMODYNAMICS:    VENTILATOR SETTINGS: Vent Mode: PRVC FiO2 (%):  [40 %] 40 % Set Rate:  [16 bmp] 16 bmp Vt Set:  [620 mL] 620 mL PEEP:  [5 cmH20] 5 cmH20 Plateau Pressure:  [11 cmH20-18  cmH20] 18 cmH20  INTAKE / OUTPUT: I/O last 3 completed shifts: In: 5894.3 [I.V.:2619.4; NG/GT:1656.3; IV Piggyback:1618.6] Out: 5251 [Urine:4801; Emesis/NG output:450]  PHYSICAL EXAMINATION: Gen:      No acute distress HEENT:  EOMI, sclera anicteric, condyloma around month Neck:     No masses; no thyromegaly Lungs:    Clear to auscultation bilaterally; normal respiratory effort CV:         Regular rate and rhythm; no murmurs Abd:      + bowel sounds; soft, non-tender; no palpable masses, no distension Ext:    No edema; adequate peripheral perfusion Skin:      Warm and dry; no rash Neuro: alert and oriented x 3 Psych: normal mood and affect Scrotal were noted with serosanguineous discharge  LABS:  BMET  Recent Labs Lab 12/02/16 0530 12/03/16 0425 12/04/16 0211  NA 136 136 138  K 4.2 4.5 4.9  CL 106 107 104  CO2 21* 25 25  BUN 15 19 30*  CREATININE 0.98 1.04 1.09  GLUCOSE 307* 236* 207*    Electrolytes  Recent Labs Lab 12/02/16 0530 12/03/16 0425 12/04/16 0211  CALCIUM 8.0* 8.1* 8.4*  MG 2.3 2.2 2.3  PHOS 2.2* 3.1 4.3    CBC  Recent Labs Lab 12/02/16 0530 12/03/16 0425 12/04/16 0459  WBC 17.2* 12.5* 10.4  HGB 8.5* 8.0* 7.8*  HCT 26.1* 25.0* 24.5*  PLT 218 218 201    Coag's  No results for input(s): APTT, INR in the last 168 hours.  Sepsis Markers  Recent Labs Lab 12/02/16 0009 12/02/16 0530  LATICACIDVEN 1.3 1.3    ABG  Recent Labs Lab 12/02/16 1114  PHART 7.464*  PCO2ART 33.5  PO2ART 131.0*    Liver Enzymes  Recent Labs Lab 12/02/16 0009 12/03/16 0425 12/04/16 0211  AST 100* 128* 150*  ALT 146* 149* 173*  ALKPHOS 46 39 38  BILITOT 0.9 0.6 0.4  ALBUMIN 2.4* 2.0* 2.0*    Cardiac Enzymes  Recent Labs Lab 12/03/16 2152 12/04/16 0211 12/04/16 0811  TROPONINI 0.04* 0.04* <0.03    Glucose  Recent Labs Lab 12/03/16 1551 12/03/16 1638 12/03/16 2109 12/04/16 0001 12/04/16 0430 12/04/16 0832  GLUCAP 185* 165*  245* 304* 240* 279*    Imaging Dg Chest Port 1 View  Result Date: 12/04/2016 CLINICAL DATA:  Endotracheal tube EXAM: PORTABLE CHEST 1 VIEW COMPARISON:  12/03/2016 FINDINGS: Endotracheal tube in good position. Left jugular central venous catheter tip in the left innominate vein unchanged. NG tube in the stomach. Mild progression of bibasilar airspace disease. No edema or effusion. IMPRESSION: Endotracheal tube in good position Mild progression of bibasilar atelectasis. Electronically Signed   By: Franchot Gallo M.D.   On: 12/04/2016 07:11   Dg Chest Port 1 View  Result Date: 12/03/2016 CLINICAL DATA:  Endotracheal tube placement.  Initial encounter. EXAM: PORTABLE CHEST 1 VIEW COMPARISON:  Chest radiograph performed earlier today at 5:12 p.m. FINDINGS: The patient's endotracheal tube is seen ending 8 cm above the carina. An enteric tube is noted extending below the diaphragm. A left IJ line is noted ending about the left brachiocephalic vein. Mild bibasilar airspace opacities likely reflect atelectasis. No pleural effusion or pneumothorax is seen. The cardiomediastinal silhouette is normal in size. No acute osseous abnormalities are identified. IMPRESSION: 1. Endotracheal tube seen ending 8 cm above the carina. This could be advanced 4-5 cm. 2. Mild bibasilar airspace opacities likely reflect atelectasis. Electronically Signed   By: Garald Balding M.D.   On: 12/03/2016 20:39   Dg Chest Port 1 View  Result Date: 12/03/2016 CLINICAL DATA:  Endotracheal tube repositioning with further advancement EXAM: PORTABLE CHEST 1 VIEW COMPARISON:  None. FINDINGS: The tip of an endotracheal tube is 7 cm above the carina at the level of the clavicular heads in the upper limits of normal. Mild interstitial edema. Normal size cardiac silhouette. No aortic aneurysm. No pneumothorax. Left IJ central line catheter is unchanged in position with tip projecting over the left medial clavicular head. IMPRESSION: 1. Endotracheal  tube tip is 7 cm above the carina. No significant change left IJ central line catheter with tip projecting the medial left clavicular head as before. 2. Slight increase in interstitial edema since prior exam. No pneumothorax. Electronically Signed   By: Ashley Royalty M.D.   On: 12/03/2016 17:33   Dg Chest Port 1 View  Result Date: 12/03/2016 CLINICAL DATA:  Advanced endotracheal tube EXAM: PORTABLE CHEST 1 VIEW COMPARISON:  Earlier today FINDINGS: Endotracheal tube tip in good position at the clavicular heads. An orogastric tube at least reaches the diaphragm. Left IJ central line with tip left of midline at the level of the left clavicular head. Arterial and venous structures overlap in this location. Normal heart size and mediastinal contours. Mild interstitial coarsening at the bases is stable. No edema, visible effusion, or pneumothorax. IMPRESSION: 1. Improved endotracheal tube positioning, tip at the clavicular heads. 2. Elsewhere stable chest. Electronically Signed  By: Monte Fantasia M.D.   On: 12/03/2016 16:18   Dg Chest Port 1 View  Result Date: 12/03/2016 CLINICAL DATA:  Endotracheal tube adjustment.  Hypoxia. EXAM: PORTABLE CHEST 1 VIEW COMPARISON:  12/03/2016 at 3:30 a.m. FINDINGS: Endotracheal tube has been pulled back as tip is now 13.8 cm above the carina. This could be advanced another 7 cm. Nasogastric tube appears to also been pulled back with tip over the distal esophagus. Left IJ central venous catheter unchanged with tip in the region of the brachiocephalic vein. Lungs are adequately inflated with stable mild hazy left base opacification which may be due to atelectasis/effusion or infection. Cardiomediastinal silhouette and remainder the exam is unchanged. IMPRESSION: Stable hazy left base opacification which may be due to atelectasis/ effusion versus infection. Tubes and lines as described. Note that the endotracheal tube and nasogastric tubes have been pulled back as described.  Findings discussed with patient's nurse, Tamala Fothergill, at the time of dictation. Electronically Signed   By: Marin Olp M.D.   On: 12/03/2016 16:02     STUDIES:  CXR 1/24 >> ATX vs LLL infiltrate CT head 1/25 >> neg for acute intracranial process LP cytology 1/25 >> CSF crypto ag >> neg LP >> yellow, cloudy, glucose 20, total protein >600, RBC 114>49, WBC 970, 36% neutrophil, 20% lymphs, 19% eos HIV quant >>  CD4 10 HCV ab +  CULTURES: 1/24 BCx x 2 ([place]): 1/24 HSV PCR [name]): 1/24 Cryptococcal [name]): 1/24 CSF [name]): 1/25 BCx x2 1/25: 1/24 Trach asp 1/24:  1/24 MRSA PCR:  1/25 CSF AFB: 1/25 fungal cx CSF: 1/25 gram stain no organisms, cx >>  ANTIBIOTICS: Rocephin 1/24>> Vancomycin 1/24>> Acyclovir 1/24>> Ampicillin 1/24>> Bactrim M/W/F for PCP prophylaxis  SIGNIFICANT EVENTS: 1/24> Transferred from [facility] 1/25> Underwent LP here  LINES/TUBES: L IJ CVL 1/24 >> ETT 1/24 >> Foley 1/24 >> OGT 1/24 >>  DISCUSSION: 51 year old man with HIV/AIDS (first diagnosed 2003, last CD4 count 245 with viremia of 1,160 in October 2017; on Genovya, Darunavir and Bactrim), Genital Warts, COPD, and cocaine substance abuse who presented to [facility] on 1/24 with altered mental status. Intubated for airway protection.   ASSESSMENT / PLAN:  PULMONARY A: Acute Respiratory Failure 2/2 Underlying Sepsis and Meningitis- Intubated for airway protection Possible LLL PNA P:   Continue full vent support Antibiotics as per ID section  CARDIOVASCULAR A:  Sinus Tachycardia > resolved Incomplete RBBB HTN P:  Hydralazine when necessary  RENAL A:   Lactic Acidosis, resolved P:   Follow labs, urine output, creatinine  GASTROINTESTINAL A:   Transaminitis P:   Continue tube feeds Follow LFTs Famotidine 20 mg IV BID  HEMATOLOGIC A:   Mild leukocytosis > improving Mild anemia P:  Sub Q heparin  INFECTIOUS A:   Bacterial meningitis, possible  parasitic given elevated eosinophils, also would consider coccidiodes 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 Pneumonia pending Continue antiretrovirals per ID Ceftriaxone 1/24 >> Vancomycin 1/24 >> Ampicillin given age >41 >> Acyclovir 1/24 >> Off steroids since yesteday Bactrim MWF ppx  ENDOCRINE A:   Hyperglycemia   P:   SSI-R CBGs Q4H  NEUROLOGIC A:   Metabolic Encephalopathy History of Cocaine Abuse History of Personality Disorder  P:   Hold off  on MRI for now until airway can be stabilized (wont change management anyway) Continue fentanyl drip, Protonix drip Add Seroquel for agitation RASS goal: 0 to -1  FAMILY  - Updates: Mother, sister updated via phone 1/26 - Inter-disciplinary family meet or Palliative Care meeting due by:  1/31  Critical care- 38 mins.  Marshell Garfinkel MD [place] [facility] Pager [phone] If no answer or after 3pm call: [phone] 12/04/2016, 10:48 AM

## 2016-12-05 ENCOUNTER — Inpatient Hospital Stay (HOSPITAL_COMMUNITY): Payer: Medicaid Other

## 2016-12-05 LAB — CBC
HEMATOCRIT: 26 % — AB (ref 39.0–52.0)
HEMOGLOBIN: 8.3 g/dL — AB (ref 13.0–17.0)
MCH: 31.2 pg (ref 26.0–34.0)
MCHC: 31.9 g/dL (ref 30.0–36.0)
MCV: 97.7 fL (ref 78.0–100.0)
PLATELETS: 212 10*3/uL (ref 150–400)
RBC: 2.66 MIL/uL — AB (ref 4.22–5.81)
RDW: 16.7 % — ABNORMAL HIGH (ref 11.5–15.5)
WBC: 10.2 10*3/uL (ref 4.0–10.5)

## 2016-12-05 LAB — HEPATIC FUNCTION PANEL
ALK PHOS: 44 U/L (ref 38–126)
ALT: 203 U/L — AB (ref 17–63)
AST: 145 U/L — AB (ref 15–41)
Albumin: 1.9 g/dL — ABNORMAL LOW (ref 3.5–5.0)
Bilirubin, Direct: 0.1 mg/dL (ref 0.1–0.5)
Indirect Bilirubin: 0.4 mg/dL (ref 0.3–0.9)
Total Bilirubin: 0.5 mg/dL (ref 0.3–1.2)
Total Protein: 6.3 g/dL — ABNORMAL LOW (ref 6.5–8.1)

## 2016-12-05 LAB — BASIC METABOLIC PANEL
ANION GAP: 7 (ref 5–15)
BUN: 31 mg/dL — ABNORMAL HIGH (ref 6–20)
CO2: 27 mmol/L (ref 22–32)
Calcium: 8.5 mg/dL — ABNORMAL LOW (ref 8.9–10.3)
Chloride: 105 mmol/L (ref 101–111)
Creatinine, Ser: 1 mg/dL (ref 0.61–1.24)
GFR calc Af Amer: >60 mL/min (ref >60)
GLUCOSE: 314 mg/dL — AB (ref 65–99)
POTASSIUM: 4.4 mmol/L (ref 3.5–5.1)
Sodium: 139 mmol/L (ref 135–145)

## 2016-12-05 LAB — BODY FLUID CELL COUNT WITH DIFFERENTIAL
Eos, Fluid: 1 %
Lymphs, Fluid: 2 %
Monocyte-Macrophage-Serous Fluid: 68 % (ref 50–90)
Neutrophil Count, Fluid: 29 % — ABNORMAL HIGH (ref 0–25)
WBC FLUID: 266 uL (ref 0–1000)

## 2016-12-05 LAB — GLUCOSE, CAPILLARY
GLUCOSE-CAPILLARY: 235 mg/dL — AB (ref 65–99)
GLUCOSE-CAPILLARY: 258 mg/dL — AB (ref 65–99)
GLUCOSE-CAPILLARY: 263 mg/dL — AB (ref 65–99)
GLUCOSE-CAPILLARY: 271 mg/dL — AB (ref 65–99)
GLUCOSE-CAPILLARY: 291 mg/dL — AB (ref 65–99)
GLUCOSE-CAPILLARY: 295 mg/dL — AB (ref 65–99)
Glucose-Capillary: 261 mg/dL — ABNORMAL HIGH (ref 65–99)

## 2016-12-05 LAB — PHOSPHORUS: Phosphorus: 3.7 mg/dL (ref 2.5–4.6)

## 2016-12-05 LAB — ACID FAST SMEAR (AFB, MYCOBACTERIA): Acid Fast Smear: NEGATIVE

## 2016-12-05 LAB — ACID FAST SMEAR (AFB)

## 2016-12-05 LAB — MAGNESIUM: Magnesium: 2.3 mg/dL (ref 1.7–2.4)

## 2016-12-05 MED ORDER — MIDAZOLAM HCL 2 MG/2ML IJ SOLN
4.0000 mg | Freq: Once | INTRAMUSCULAR | Status: AC
Start: 2016-12-05 — End: 2016-12-05
  Administered 2016-12-05: 4 mg via INTRAVENOUS
  Filled 2016-12-05: qty 4

## 2016-12-05 MED ORDER — ASPIRIN 325 MG PO TABS
325.0000 mg | ORAL_TABLET | Freq: Every day | ORAL | Status: DC
  Administered 2016-12-05 – 2016-12-27 (×19): 325 mg
  Filled 2016-12-05 (×20): qty 1

## 2016-12-05 MED ORDER — SENNOSIDES 8.8 MG/5ML PO SYRP
5.0000 mL | ORAL_SOLUTION | Freq: Two times a day (BID) | ORAL | Status: DC | PRN
  Administered 2016-12-05 – 2016-12-12 (×6): 5 mL
  Filled 2016-12-05 (×7): qty 5

## 2016-12-05 MED ORDER — INSULIN DETEMIR 100 UNIT/ML ~~LOC~~ SOLN
18.0000 [IU] | Freq: Two times a day (BID) | SUBCUTANEOUS | Status: DC
Start: 2016-12-05 — End: 2016-12-05

## 2016-12-05 MED ORDER — INSULIN DETEMIR 100 UNIT/ML ~~LOC~~ SOLN
18.0000 [IU] | Freq: Two times a day (BID) | SUBCUTANEOUS | Status: DC
  Administered 2016-12-05 (×2): 18 [IU] via SUBCUTANEOUS
  Filled 2016-12-05 (×3): qty 0.18

## 2016-12-05 MED ORDER — ATROPINE SULFATE 1 MG/10ML IJ SOSY
PREFILLED_SYRINGE | INTRAMUSCULAR | Status: AC
  Filled 2016-12-05: qty 10

## 2016-12-05 MED ORDER — ROCURONIUM BROMIDE 50 MG/5ML IV SOLN
100.0000 mg | Freq: Once | INTRAVENOUS | Status: AC | PRN
  Administered 2016-12-09: 50 mg via INTRAVENOUS
  Filled 2016-12-05 (×2): qty 10

## 2016-12-05 NOTE — Progress Notes (Signed)
PULMONARY / CRITICAL CARE MEDICINE   Name: Lawrence Kane MRN: [id number] DOB: April 26, 1966    ADMISSION DATE:  12/01/2016 CONSULTATION DATE:  12/01/2016  REFERRING MD:  [name] transfer  CHIEF COMPLAINT:  AMS  HISTORY OF PRESENT ILLNESS:   51 year old man with HIV/AIDS (first diagnosed 2003, last CD4 count 245 with viremia of 1,160 in October 2017; on Genovya, Darunavir and Bactrim), Genital Warts, COPD, and cocaine substance abuse who presented to [facility] on 1/24 with complaint of altered mental status. Per notes, patient may have recently had some type of surgery in [place] where his mother resides. Afebrile, tachycardic to 155, RR 18, hypertensive to 240/112. Patient was intubated for airway protection, CVL in IJ was placed, and LP was performed given his altered mental status. He was started on Versed and propofol for sedation. Initial labs showed leukocytosis of 13.9, hemoglobin 10.9, hct 33.6 and plts 269. BMET showed Sodium 139, K 3.9, Cl 101, Bicarb 21, BUN 23, Creatine 1.10, glucose 163. ABG showed pH of 7.28, pCO2 48, pO2 158, on FiO2 of 40%, PEEP 5, TV 450 and RR 18. Lactic Acid 3.3, 6.9. AST 125, ALT 224, and Alk Phos 88. UA negative for infection. UDS positive for cocaine. CSF appeared yellow, cloudy, xanthochromic, WBC 4845, RBC 625, glucose <20, total protein >300, Neutrophils 16%, Eos 64%, monocytes 17%, lymphocytes 3%. Gram stain performed, but unable to view results. ID was consulted. Dr. Baxter Flattery recommended continuing Truvada and Tivicay through NGT  SUBJECTIVE:  Remains intubated, agitated Self extubated at MRI  VITAL SIGNS: BP (!) 142/75   Pulse 70   Temp 98 F (36.7 C) (Oral)   Resp 20   Ht _0  (1.93 m)   Wt 236 lb 8.9 oz (107.3 kg)   SpO2 97%   BMI 28.79 kg/m   HEMODYNAMICS:    VENTILATOR SETTINGS: Vent Mode: PRVC FiO2 (%):  [30 %-40 %] 30 % Set Rate:  [16 bmp] 16 bmp Vt Set:  [035 mL] 620 mL PEEP:  [5 cmH20] 5 cmH20 Plateau Pressure:  [9  cmH20-18 cmH20] 16 cmH20  INTAKE / OUTPUT: I/O last 3 completed shifts: In: 0093 [I.V.:2100; NG/GT:3035; IV Piggyback:2010] Out: 6002 [Urine:5702; Emesis/NG output:300]  PHYSICAL EXAMINATION: Gen:      No acute distress HEENT:  EOMI, sclera anicteric, condyloma around month Neck:     No masses; no thyromegaly Lungs:    Clear to auscultation bilaterally; normal respiratory effort CV:         Regular rate and rhythm; no murmurs Abd:      + bowel sounds; soft, non-tender; no palpable masses, no distension Ext:    No edema; adequate peripheral perfusion Skin:      Warm and dry; no rash Neuro: alert and oriented x 3 Psych: normal mood and affect Scrotal were noted with serosanguineous discharge  LABS:  BMET  Recent Labs Lab 12/03/16 0425 12/04/16 0211 12/05/16 0431  NA 136 138 139  K 4.5 4.9 4.4  CL 107 104 105  CO2 _1 BUN 19 30* 31*  CREATININE 1.04 1.09 1.00  GLUCOSE 236* 207* 314*    Electrolytes  Recent Labs Lab 12/03/16 0425 12/04/16 0211 12/05/16 0431  CALCIUM 8.1* 8.4* 8.5*  MG 2.2 2.3 2.3  PHOS 3.1 4.3 3.7    CBC  Recent Labs Lab 12/03/16 0425 12/04/16 0459 12/05/16 0431  WBC 12.5* 10.4 10.2  HGB 8.0* 7.8* 8.3*  HCT 25.0* 24.5* 26.0*  PLT 218 201 212  Coag's No results for input(s): APTT, INR in the last 168 hours.  Sepsis Markers  Recent Labs Lab 12/02/16 0009 12/02/16 0530  LATICACIDVEN 1.3 1.3    ABG  Recent Labs Lab 12/02/16 1114  PHART 7.464*  PCO2ART 33.5  PO2ART 131.0*    Liver Enzymes  Recent Labs Lab 12/02/16 0009 12/03/16 0425 12/04/16 0211  AST 100* 128* 150*  ALT 146* 149* 173*  ALKPHOS 46 39 38  BILITOT 0.9 0.6 0.4  ALBUMIN 2.4* 2.0* 2.0*    Cardiac Enzymes  Recent Labs Lab 12/03/16 2152 12/04/16 0211 12/04/16 0811  TROPONINI 0.04* 0.04* <0.03    Glucose  Recent Labs Lab 12/04/16 0832 12/04/16 1224 12/04/16 1536 12/04/16 1943 12/05/16 0026 12/05/16 0403  GLUCAP 279* 253*  211* 172* 271* 291*    Imaging Mr Brain W Wo Contrast  Result Date: 12/04/2016 CLINICAL DATA:  Acute encephalopathy.  HIV and substance abuse. EXAM: MRI HEAD WITHOUT AND WITH CONTRAST TECHNIQUE: Multiplanar, multiecho pulse sequences of the brain and surrounding structures were obtained without and with intravenous contrast. CONTRAST:  51m MULTIHANCE GADOBENATE DIMEGLUMINE 529 MG/ML IV SOLN COMPARISON:  Two days ago FINDINGS: Brain: Small foci of restricted diffusion which appear parenchymal and do not have rim enhancement, most consistent with acute infarcts. Clustered small infarcts seen in the left more than right cerebellar tonsils and inferior vermis. Small bilateral inferior frontal cortical infarcts. Small infarcts in the bilateral upper insula and on the left frontal and right occipital convexities. On FLAIR imaging there is hyperintensity following sulci which could be related to patient's meningitis (debris) or intubation with increased FiO2. No debris seen within the ventricular system. No hydrocephalus. Vascular: Major vessels are patent. Symmetric prominence of the superior ophthalmic veins which may be related to Valsalva as the cavernous sinus region is unremarkable. The right cervical and skullbase ICA is smaller than the left. This may be related to hypoplastic right A1 segment as there is also matching baseline canal asymmetry on 08/27/2015 head CT. Skull and upper cervical spine: No visible discitis. Sinuses/Orbits: Remote blowout fracture of the medial wall left orbit. Fluid levels throughout the paranasal sinuses. Patient is intubated. No visible intracranial extension to explain meningitis history. IMPRESSION: 1. 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 This pattern could be related to vasospasm from the patient's meningitis, or from central/cardiac embolic disease. 2. Sulcal signal could be meningitis debris or artifact from elevated FiO2. Negative for intracranial  collection or intraventricular debris. 3. History of HSV positive CSF PCR. No typical temporal/limbic encephalitis pattern. 4. Generalized sinusitis and mastoiditis. Electronically Signed   By: JMonte FantasiaM.D.   On: 12/04/2016 18:00   Dg Chest Port 1 View  Result Date: 12/05/2016 CLINICAL DATA:  Acute respiratory failure. EXAM: PORTABLE CHEST 1 VIEW COMPARISON:  12/04/2016 FINDINGS: Support apparatus is unchanged. Cardiomediastinal silhouette is normal. Mediastinal contours appear intact. There is no evidence of pleural effusion or pneumothorax. Improvement in the aeration of the lungs with residual peribronchial opacities predominantly in the left lower lobe. Osseous structures are without acute abnormality. Soft tissues are grossly normal. IMPRESSION: Improved aeration of the lungs with residual peribronchial opacities predominantly in the left lower lobe. Stable support apparatus. Electronically Signed   By: DFidela SalisburyM.D.   On: 12/05/2016 07:27     STUDIES:  CXR 1/24 >> ATX vs LLL infiltrate CT head 1/25 >> neg for acute intracranial process LP cytology 1/25 >> CSF crypto ag >> neg LP >> yellow, cloudy, glucose  20, total protein >600, RBC 114>49, WBC 970, 36% neutrophil, 20% lymphs, 19% eos HIV quant 1/25 >> 80 CD4 1/25 >> 10 HCV ab + 1/28 BAL cell count> 1/28 BAL cytology 1/27 BAL for wet mount 1/27 MRI brain >> small acute infarcts in cerebellum and cerebral cortex, sucal signal   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 1/24 Cryptococcal [name]): negative 1/24 CSF [facility]): NGTD 1/22 scrotal wound cx [name]): MRSA Toxoplasma [name]): Negative 1/25 BCx x2 1/25: 1/24 Trach asp 1/24:  1/24 MRSA PCR:  1/25 CSF AFB: 1/25 fungal cx CSF: NGTD 1/25 CSF gram stain no organisms, cx >> NGTD 1/27 strongyloides ab >> 1/27 coccidiomycosis ab >> 1/28 BAL AFB smear/cx> 1/28 BAL Fungus cx 1/28 BAL gram stain/Cx 1/28 BAL  pneumocystis smear 1/27 O&P   ANTIBIOTICS: Rocephin 1/24>> Vancomycin 1/24>> Acyclovir 1/24>> 1/26, 1/27>> Ampicillin 1/24>> 1/27 Bactrim M/W/F for PCP prophylaxis  SIGNIFICANT EVENTS: 1/24> Transferred from [facility] 1/25> Underwent LP here  LINES/TUBES: L IJ CVL 1/24 >> ETT 1/24 >> Foley 1/24 >> OGT 1/24 >>  DISCUSSION: 51 year old man with HIV/AIDS (first diagnosed 2003, last CD4 count 245 with viremia of 1,160 in October 2017; on Genovya, Darunavir and Bactrim), Genital Warts, COPD, and cocaine substance abuse who presented to [facility] on 1/24 with altered mental status. Intubated for airway protection.   ASSESSMENT / PLAN:  PULMONARY A: Acute Respiratory Failure 2/2 Underlying Sepsis and Meningitis- Intubated for airway protection Possible LLL PNA P:   Continue full vent support Antibiotics as per ID section BAL performed to send for cytology and testing for strongyloides  CARDIOVASCULAR A:  Sinus Tachycardia Incomplete RBBB HTN P:  Hydralazine prn Echo  RENAL A:   Lactic Acidosis, resolved P:   Follow labs, urine output, creatinine  GASTROINTESTINAL A:   Transaminitis HCV ab+ P:   Continue tube feeds Follow LFTs Famotidine 20 mg IV BID  HEMATOLOGIC A:   Mild leukocytosis > improving Mild anemia P:  Sub Q heparin  INFECTIOUS A:   Bacterial meningitis, possible parasitic given elevated eosinophils, also would consider coccidiodes 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 Possible LLL PNA HIV > CD4 10  Open Wound of Scrotum Hep B core antibody, surface ab, surface ag > consistent with unvaccinated status Hep C antibody reflex > +, Follow viral load P:   Toxoplasma neg M. 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

## 2016-12-05 NOTE — Procedures (Signed)
PCCM Bronchoscopy Procedure Note  The patient was informed of the risks (including but not limited to bleeding, infection, respiratory failure, lung injury, tooth/oral injury) and benefits of the procedure and gave consent, see chart.  Indication: Evaluate for infection in pt with resp failure, meningitis, HIV, AIDS  Location: ICU  Condition pre procedure: Stable  Medications for procedure: Versed, fentanyl  Procedure description: The bronchoscope was introduced through the endotracheal tube and passed to the bilateral lungs to the level of the subsegmental bronchi throughout the tracheobronchial tree.  Airway exam revealed normal anatomy  Procedures performed: BAL in RML. 50cc aliquots x 3 were instilled. Return was cloudy with no evidence of bleed. All aliquots were combined into one 100cc sample  Specimens sent: Culture, parasite exam, AFB, fungal, PCP, cytology  Condition post procedure: Stable  EBL: 0  Complications: None. Assisted by Jacques Earthly MD  Marshell Garfinkel MD  Pulmonary and Critical Care Pager [phone] If no answer or after 3pm call: [phone] 12/05/2016, 4:43 PM

## 2016-12-05 NOTE — Procedures (Signed)
Bedside Bronchoscopy Procedure Note Lawrence NorthDarren E Kane [id number] 10-02-1966  Procedure: Bronchoscopy Indications: Obtain specimens for culture and/or other diagnostic studies  Procedure Details: ET Tube Size: ET Tube secured at lip (cm): Bite block in place: Yes In preparation for procedure, Patient hyper-oxygenated with 100 % FiO2 Airway entered and the following bronchi were examined: RML.   Bronchoscope removed.    Evaluation BP (!) 142/77   Pulse 78   Temp 98.7 F (37.1 C) (Oral)   Resp 17   Ht 6\' 4"  (1.93 m)   Wt 236 lb 8.9 oz (107.3 kg)   SpO2 98%   BMI 28.79 kg/m  Breath Sounds:Diminished O2 sats: stable throughout Patient's Current Condition: stable Specimens:  Sent purulent fluid Complications: No apparent complications Patient did tolerate procedure well.   Ronny FlurryMorgan B Theador Jezewski 12/05/2016, 9:52 AM

## 2016-12-05 NOTE — Progress Notes (Addendum)
[facility] for Infectious Disease    Date of Admission:  12/01/2016   Total days of antibiotics 5        Day 4 amp- d/c'd on 1/27        Day 5 ceftriaxone        Day 5  vanco        Day 4  acyclovir   ID: Lawrence Kane is a 51 y.o. 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 Active Problems:   COPD (chronic obstructive pulmonary disease) ([place])   Substance abuse   Genital warts   HIV disease ([place])   AIDS ([place])   Noncompliance   Bacterial meningitis   Hypertensive urgency   Septic shock ([place])   Transaminitis   Cocaine abuse   Open wound   Acute respiratory failure with hypoxia ([place])   Altered mental status    Subjective:  Afebrile, underwent mri yesterday and BAL today  Update from OSH Micro: 1) 1/22 blood cx NGTD 2) 1/22 scrotal wound cx MRSA 3) 1/24 CSF cx NGTD 4) 1/24 blood cx NGTD 5) CSF HSV PCR HSV-2 POSITIVE 6) crypto negative 7) cd 4 count  248(17%) 8) VL 90 viral copies 9) toxoplasma negative  Medications:  . acyclovir  1,000 mg Intravenous Q8H  . atropine      . cefTRIAXone (ROCEPHIN)  IV  2 g Intravenous Q12H  . chlorhexidine gluconate (MEDLINE KIT)  15 mL Mouth Rinse BID  . darunavir-cobicistat  1 tablet Oral Q breakfast  . dolutegravir  50 mg Oral Daily  . emtricitabine-tenofovir AF  1 tablet Per Tube Daily  . famotidine  20 mg Per Tube BID  . fentaNYL (SUBLIMAZE) injection  50 mcg Intravenous Once  . heparin  5,000 Units Subcutaneous Q8H  . insulin aspart  0-20 Units Subcutaneous Q4H  . insulin aspart  5 Units Subcutaneous Q4H  . insulin detemir  18 Units Subcutaneous BID  . mouth rinse  15 mL Mouth Rinse 10 times per day  . QUEtiapine  50 mg Per Tube BID  . sulfamethoxazole-trimethoprim  20 mL Per Tube Once per day on Mon Wed Fri  .  vancomycin  1,250 mg Intravenous Q8H    Objective: Vital signs in last 24 hours: Temp:  [97.3 F (36.3 C)-100 F (37.8 C)] 97.9 F (36.6 C) (01/28 1152) Pulse Rate:  [50-110] 110 (01/28 1000) Resp:  [12-20] 16 (01/28 1000) BP: (102-162)/(58-89) 152/81 (01/28 1000) SpO2:  [90 %-100 %] 90 % (01/28 1000) FiO2 (%):  [30 %-40 %] 30 % (01/28 0829) Weight:  [236 lb 8.9 oz (107.3 kg)] 236 lb 8.9 oz (107.3 kg) (01/28 0419) Physical Exam  Constitutional: intubated, sedated He appears well-developed and well-nourished. No distress.  HENT:  Mouth/Throat: OETT  Cardiovascular: Normal rate, regular rhythm and normal heart sounds. Exam reveals no gallop and no friction rub.  No murmur heard.  Pulmonary/Chest: Effort normal and breath sounds normal. No respiratory distress. He has no wheezes.  Abdominal: Soft. Bowel sounds are decreased. He exhibits no distension. There is no tenderness.  Skin: large GU wound has some whitish exudate in wound bed   Lab Results  Recent Labs  12/04/16 0211 12/04/16 0459 12/05/16 0431  WBC  --  10.4 10.2  HGB  --  7.8* 8.3*  HCT  --  24.5* 26.0*  NA 138  --  139  K 4.9  --  4.4  CL 104  --  105  CO2 25  --  27  BUN 30*  --  31*  CREATININE 1.09  --  1.00   Liver Panel  Recent Labs  12/03/16 0425 12/04/16 0211  PROT 6.3* 6.2*  ALBUMIN 2.0* 2.0*  AST 128* 150*  ALT 149* 173*  ALKPHOS 39 38  BILITOT 0.6 0.4    Microbiology: 1/25 blood cx NGTD 1/25 csf cx NGTD 1/25 csf fungal cx pending 1/25 csf afb cx pending/ NO AFB  Studies/Results: cxr per my review has increased mild atelectesis  Mr Lawrence Kane Wo Contrast  Result Date: 12/04/2016 CLINICAL DATA:  Acute encephalopathy.  HIV and substance abuse. EXAM: MRI HEAD WITHOUT AND WITH CONTRAST TECHNIQUE: Multiplanar, multiecho pulse sequences of the brain and surrounding structures were obtained without and with intravenous contrast. CONTRAST:  29m MULTIHANCE GADOBENATE DIMEGLUMINE 529 MG/ML IV  SOLN COMPARISON:  Two days ago FINDINGS: Brain: Small foci of restricted diffusion which appear parenchymal and do not have rim enhancement, most consistent with acute infarcts. Clustered small infarcts seen in the left more than right cerebellar tonsils and inferior vermis. Small bilateral inferior frontal cortical infarcts. Small infarcts in the bilateral upper insula and on the left frontal and right occipital convexities. On FLAIR imaging there is hyperintensity following sulci which could be related to patient's meningitis (debris) or intubation with increased FiO2. No debris seen within the ventricular system. No hydrocephalus. Vascular: Major vessels are patent. Symmetric prominence of the superior ophthalmic veins which may be related to Valsalva as the cavernous sinus region is unremarkable. The right cervical and skullbase ICA is smaller than the left. This may be related to hypoplastic right A1 segment as there is also matching baseline canal asymmetry on 08/27/2015 head CT. Skull and upper cervical spine: No visible discitis. Sinuses/Orbits: Remote blowout fracture of the medial wall left orbit. Fluid levels throughout the paranasal sinuses. Patient is intubated. No visible intracranial extension to explain meningitis history. IMPRESSION: 1. 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 This pattern could be related to vasospasm from the patient's meningitis, or from central/cardiac embolic disease. 2. Sulcal signal could be meningitis debris or artifact from elevated FiO2. Negative for intracranial collection or intraventricular debris. 3. History of HSV positive CSF PCR. No typical temporal/limbic encephalitis pattern. 4. Generalized sinusitis and mastoiditis. Electronically Signed   By: JMonte FantasiaM.D.   On: 12/04/2016 18:00   Dg Chest Port 1 View  Result Date: 12/05/2016 CLINICAL DATA:  Acute respiratory failure. EXAM: PORTABLE CHEST 1 VIEW COMPARISON:  12/04/2016 FINDINGS:  Support apparatus is unchanged. Cardiomediastinal silhouette is normal. Mediastinal contours appear intact. There is no evidence of pleural effusion or pneumothorax. Improvement in the aeration of the lungs with residual peribronchial opacities predominantly in the left lower lobe. Osseous structures are without acute abnormality. Soft tissues are grossly normal. IMPRESSION: Improved aeration of the lungs with residual peribronchial opacities predominantly in the left lower lobe. Stable support apparatus. Electronically Signed   By: DFidela SalisburyM.D.   On: 12/05/2016 07:27   Dg Chest Port 1 View  Result Date: 12/04/2016 CLINICAL DATA:  Endotracheal tube EXAM: PORTABLE CHEST 1 VIEW COMPARISON:  12/03/2016 FINDINGS: Endotracheal tube in good position. Left jugular central venous catheter tip in the left innominate vein unchanged. NG tube in the stomach. Mild progression of bibasilar airspace disease. No edema or effusion. IMPRESSION: Endotracheal  tube in good position Mild progression of bibasilar atelectasis. Electronically Signed   By: Franchot Gallo M.D.   On: 12/04/2016 07:11   Dg Chest Port 1 View  Result Date: 12/03/2016 CLINICAL DATA:  Endotracheal tube placement.  Initial encounter. EXAM: PORTABLE CHEST 1 VIEW COMPARISON:  Chest radiograph performed earlier today at 5:12 p.m. FINDINGS: The patient's endotracheal tube is seen ending 8 cm above the carina. An enteric tube is noted extending below the diaphragm. A left IJ line is noted ending about the left brachiocephalic vein. Mild bibasilar airspace opacities likely reflect atelectasis. No pleural effusion or pneumothorax is seen. The cardiomediastinal silhouette is normal in size. No acute osseous abnormalities are identified. IMPRESSION: 1. Endotracheal tube seen ending 8 cm above the carina. This could be advanced 4-5 cm. 2. Mild bibasilar airspace opacities likely reflect atelectasis. Electronically Signed   By: Garald Balding M.D.   On:  12/03/2016 20:39   Dg Chest Port 1 View  Result Date: 12/03/2016 CLINICAL DATA:  Endotracheal tube repositioning with further advancement EXAM: PORTABLE CHEST 1 VIEW COMPARISON:  None. FINDINGS: The tip of an endotracheal tube is 7 cm above the carina at the level of the clavicular heads in the upper limits of normal. Mild interstitial edema. Normal size cardiac silhouette. No aortic aneurysm. No pneumothorax. Left IJ central line catheter is unchanged in position with tip projecting over the left medial clavicular head. IMPRESSION: 1. Endotracheal tube tip is 7 cm above the carina. No significant change left IJ central line catheter with tip projecting the medial left clavicular head as before. 2. Slight increase in interstitial edema since prior exam. No pneumothorax. Electronically Signed   By: Ashley Royalty M.D.   On: 12/03/2016 17:33   Dg Chest Port 1 View  Result Date: 12/03/2016 CLINICAL DATA:  Advanced endotracheal tube EXAM: PORTABLE CHEST 1 VIEW COMPARISON:  Earlier today FINDINGS: Endotracheal tube tip in good position at the clavicular heads. An orogastric tube at least reaches the diaphragm. Left IJ central line with tip left of midline at the level of the left clavicular head. Arterial and venous structures overlap in this location. Normal heart size and mediastinal contours. Mild interstitial coarsening at the bases is stable. No edema, visible effusion, or pneumothorax. IMPRESSION: 1. Improved endotracheal tube positioning, tip at the clavicular heads. 2. Elsewhere stable chest. Electronically Signed   By: Monte Fantasia M.D.   On: 12/03/2016 16:18   Dg Chest Port 1 View  Result Date: 12/03/2016 CLINICAL DATA:  Endotracheal tube adjustment.  Hypoxia. EXAM: PORTABLE CHEST 1 VIEW COMPARISON:  12/03/2016 at 3:30 a.m. FINDINGS: Endotracheal tube has been pulled back as tip is now 13.8 cm above the carina. This could be advanced another 7 cm. Nasogastric tube appears to also been pulled back  with tip over the distal esophagus. Left IJ central venous catheter unchanged with tip in the region of the brachiocephalic vein. Lungs are adequately inflated with stable mild hazy left base opacification which may be due to atelectasis/effusion or infection. Cardiomediastinal silhouette and remainder the exam is unchanged. IMPRESSION: Stable hazy left base opacification which may be due to atelectasis/ effusion versus infection. Tubes and lines as described. Note that the endotracheal tube and nasogastric tubes have been pulled back as described. Findings discussed with patient's nurse, Tamala Fothergill, at the time of dictation. Electronically Signed   By: Marin Olp M.D.   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 Differential is limited but differential includes strongy, coccidiodes, possible drug induced. MRI did not suggest any isolated lesions, nor pattern c/w herpes encephalitis. It did show a few infarcts which could be associated with HSV encephalitis  - continue on acyclovir - continue on bacterial meningitis coverage, since no new data to change his current regimen - consider repeat LP tomorrow to see that we are having continued improvement especially since his OP on last LP was 34.  -  strongyloides antibody and coccidiomycosis ab are pending -  I performed wet mount of BAL as well as looked at pellet of spun down BAL which did not show any signs of larvae   will consider starting ivermectin @ 269mg/kg if he clinically worsens. For now no need for empirically starting it today.  transaminitis = trending up. Unclear if these are near his baseline from his hep C. Could it be related to HSV 2. He is currently descovy which would treat risk for hep B flare. Based upon this VL on admit, it suggests he was taking his medications. Will check hep C viral load/genotype once he is  stable. - will add hepatic panel today   Chronic hep b = continue tx with descovy  Scrotal wound = continue with wound care recs. Vancomycin will cover MRSA wound cx from OSH  hiv disease= continue on current regimen plus oi proph with bactrim, though CD 4 count on day of admit suggests that his baseline is in the 250-300. CD 4 count of 10 here is likely acute change in the setting of fighting off infection  Spent 45 min with direct patient care  [facility] Cell: [phone]: [phone]  12/05/2016, 12:12 PM

## 2016-12-06 ENCOUNTER — Inpatient Hospital Stay (HOSPITAL_COMMUNITY): Payer: Medicaid Other

## 2016-12-06 DIAGNOSIS — B1910 Unspecified viral hepatitis B without hepatic coma: Secondary | ICD-10-CM

## 2016-12-06 DIAGNOSIS — G9340 Encephalopathy, unspecified: Secondary | ICD-10-CM

## 2016-12-06 DIAGNOSIS — I6789 Other cerebrovascular disease: Secondary | ICD-10-CM

## 2016-12-06 LAB — CSF CELL COUNT WITH DIFFERENTIAL
Eosinophils, CSF: 41 % — ABNORMAL HIGH (ref 0–1)
Lymphs, CSF: 55 % (ref 40–80)
Monocyte-Macrophage-Spinal Fluid: 3 % — ABNORMAL LOW (ref 15–45)
RBC COUNT CSF: 142 /mm3 — AB
Segmented Neutrophils-CSF: 1 % (ref 0–6)
Tube #: 4
WBC CSF: 885 /mm3 — AB (ref 0–5)

## 2016-12-06 LAB — BASIC METABOLIC PANEL
ANION GAP: 6 (ref 5–15)
BUN: 23 mg/dL — ABNORMAL HIGH (ref 6–20)
CALCIUM: 8.2 mg/dL — AB (ref 8.9–10.3)
CO2: 31 mmol/L (ref 22–32)
Chloride: 102 mmol/L (ref 101–111)
Creatinine, Ser: 0.96 mg/dL (ref 0.61–1.24)
GLUCOSE: 261 mg/dL — AB (ref 65–99)
POTASSIUM: 4.8 mmol/L (ref 3.5–5.1)
SODIUM: 139 mmol/L (ref 135–145)

## 2016-12-06 LAB — PROTIME-INR
INR: 1.18
PROTHROMBIN TIME: 15 s (ref 11.4–15.2)

## 2016-12-06 LAB — GLUCOSE, CAPILLARY
GLUCOSE-CAPILLARY: 262 mg/dL — AB (ref 65–99)
GLUCOSE-CAPILLARY: 220 mg/dL — AB (ref 65–99)
GLUCOSE-CAPILLARY: 136 mg/dL — AB (ref 65–99)
GLUCOSE-CAPILLARY: 258 mg/dL — AB (ref 65–99)
GLUCOSE-CAPILLARY: 222 mg/dL — AB (ref 65–99)
Glucose-Capillary: 112 mg/dL — ABNORMAL HIGH (ref 65–99)
Glucose-Capillary: 154 mg/dL — ABNORMAL HIGH (ref 65–99)
Glucose-Capillary: 165 mg/dL — ABNORMAL HIGH (ref 65–99)
Glucose-Capillary: 166 mg/dL — ABNORMAL HIGH (ref 65–99)
Glucose-Capillary: 196 mg/dL — ABNORMAL HIGH (ref 65–99)
Glucose-Capillary: 197 mg/dL — ABNORMAL HIGH (ref 65–99)
Glucose-Capillary: 201 mg/dL — ABNORMAL HIGH (ref 65–99)
Glucose-Capillary: 206 mg/dL — ABNORMAL HIGH (ref 65–99)
Glucose-Capillary: 214 mg/dL — ABNORMAL HIGH (ref 65–99)
Glucose-Capillary: 251 mg/dL — ABNORMAL HIGH (ref 65–99)

## 2016-12-06 LAB — CSF CULTURE W GRAM STAIN: Culture: NO GROWTH

## 2016-12-06 LAB — PHOSPHORUS
PHOSPHORUS: 3.3 mg/dL (ref 2.5–4.6)
PHOSPHORUS: 3.3 mg/dL (ref 2.5–4.6)

## 2016-12-06 LAB — CBC
HCT: 26.1 % — ABNORMAL LOW (ref 39.0–52.0)
Hemoglobin: 8.2 g/dL — ABNORMAL LOW (ref 13.0–17.0)
MCH: 30.7 pg (ref 26.0–34.0)
MCHC: 31.4 g/dL (ref 30.0–36.0)
MCV: 97.8 fL (ref 78.0–100.0)
PLATELETS: 206 10*3/uL (ref 150–400)
RBC: 2.67 MIL/uL — AB (ref 4.22–5.81)
RDW: 16.7 % — ABNORMAL HIGH (ref 11.5–15.5)
WBC: 12.1 10*3/uL — ABNORMAL HIGH (ref 4.0–10.5)

## 2016-12-06 LAB — ECHOCARDIOGRAM COMPLETE
Height: 76 in
WEIGHTICAEL: 3742.53 [oz_av]

## 2016-12-06 LAB — PNEUMOCYSTIS JIROVECI SMEAR BY DFA: Pneumocystis jiroveci Ag: NEGATIVE

## 2016-12-06 LAB — MAGNESIUM
MAGNESIUM: 2 mg/dL (ref 1.7–2.4)
Magnesium: 2 mg/dL (ref 1.7–2.4)

## 2016-12-06 LAB — CSF CULTURE

## 2016-12-06 LAB — PROTEIN AND GLUCOSE, CSF: GLUCOSE CSF: 82 mg/dL — AB (ref 40–70)

## 2016-12-06 LAB — TRIGLYCERIDES: TRIGLYCERIDES: 86 mg/dL (ref <150)

## 2016-12-06 MED ORDER — MUPIROCIN 2 % EX OINT
1.0000 "application " | TOPICAL_OINTMENT | Freq: Two times a day (BID) | CUTANEOUS | Status: AC
  Administered 2016-12-06 – 2016-12-10 (×10): 1 via NASAL
  Filled 2016-12-06 (×2): qty 22

## 2016-12-06 MED ORDER — CHLORHEXIDINE GLUCONATE CLOTH 2 % EX PADS
6.0000 | MEDICATED_PAD | Freq: Every day | CUTANEOUS | Status: AC
  Administered 2016-12-06 – 2016-12-10 (×5): 6 via TOPICAL

## 2016-12-06 MED ORDER — SODIUM CHLORIDE 0.9 % IV SOLN
INTRAVENOUS | Status: DC
  Administered 2016-12-06: 10:00:00 via INTRAVENOUS
  Filled 2016-12-06: qty 2.5

## 2016-12-06 NOTE — Progress Notes (Signed)
[facility] for Infectious Disease   Reason for visit: Follow up on meningitis  Interval History: awake but no response, Tmax 100.8; HSV-2 positive  CXR independently reviewed and improved compared to previous Days of antibiotics 6 Day 6 ceftriaxone Day 6 vancomycin Day 5 acyclovir  Physical Exam: Constitutional:  Vitals:   12/06/16 0743 12/06/16 0800  BP: 125/67   Pulse: 66   Resp: (!) 21   Temp:  (!) 100.8 F (38.2 C)  sedated Eyes: anicteric; eyes looking up HENT: +ET Respiratory: coarse breath sounds anteriorly; on vent Cardiovascular: Tachy RR GI: soft, nt, nd SKin: multiple hyperpigmented areas on legs, about 1/2 - 1 cm, circular  Review of Systems: Unable to be assessed due to mental status  Lab Results  Component Value Date   WBC 12.1 (H) 12/06/2016   HGB 8.2 (L) 12/06/2016   HCT 26.1 (L) 12/06/2016   MCV 97.8 12/06/2016   PLT 206 12/06/2016    Lab Results  Component Value Date   CREATININE 0.96 12/06/2016   BUN 23 (H) 12/06/2016   NA 139 12/06/2016   K 4.8 12/06/2016   CL 102 12/06/2016   CO2 31 12/06/2016    Lab Results  Component Value Date   ALT 203 (H) 12/05/2016   AST 145 (H) 12/05/2016   ALKPHOS 44 12/05/2016     Microbiology: Recent Results (from the past 240 hour(s))  MRSA PCR Screening     Status: Abnormal   Collection Time: 12/01/16 10:26 PM  Result Value Ref Range Status   MRSA by PCR POSITIVE (A) NEGATIVE Final    Comment:        The GeneXpert MRSA Assay (FDA approved for NASAL specimens only), is one component of a comprehensive MRSA colonization surveillance program. It is not intended to diagnose MRSA infection nor to guide or monitor treatment for MRSA infections. RESULT CALLED TO, READ BACK BY AND VERIFIED WITH: R DONAHUE,RN _0  12/02/16 MKELLY,MLT   Culture, respiratory (tracheal aspirate)     Status: None   Collection Time: 12/01/16 11:05 PM  Result Value Ref Range Status   Specimen Description  TRACHEAL ASPIRATE  Final   Special Requests NONE  Final   Gram Stain   Final    FEW WBC PRESENT,BOTH PMN AND MONONUCLEAR NO ORGANISMS SEEN    Culture Consistent with normal respiratory flora.  Final   Report Status 12/04/2016 FINAL  Final  Culture, blood (routine x 2)     Status: None (Preliminary result)   Collection Time: 12/02/16 12:00 AM  Result Value Ref Range Status   Specimen Description BLOOD LEFT HAND  Final   Special Requests AEROBIC BOTTLE ONLY 10ML  Final   Culture NO GROWTH 3 DAYS  Final   Report Status PENDING  Incomplete  Culture, blood (routine x 2)     Status: None (Preliminary result)   Collection Time: 12/02/16 12:04 AM  Result Value Ref Range Status   Specimen Description BLOOD RIGHT HAND  Final   Special Requests BOTTLES DRAWN AEROBIC AND ANAEROBIC 5ML  Final   Culture NO GROWTH 3 DAYS  Final   Report Status PENDING  Incomplete  Acid Fast Smear (AFB)     Status: None   Collection Time: 12/02/16  9:00 AM  Result Value Ref Range Status   AFB Specimen Processing Concentration  Final   Acid Fast Smear Negative  Final    Comment: (NOTE) Performed At: [facility] [address] [place], [place] [id number] Darrel Hoover  F MD [id number]    Source (AFB) ACID FAST BACILLI  Corrected  CSF culture     Status: None (Preliminary result)   Collection Time: 12/02/16  2:24 PM  Result Value Ref Range Status   Specimen Description CSF  Final   Special Requests NONE  Final   Gram Stain   Final    CYTOSPIN SMEAR WBC PRESENT,BOTH PMN AND MONONUCLEAR NO ORGANISMS SEEN    Culture NO GROWTH 3 DAYS  Final   Report Status PENDING  Incomplete  Culture, fungus without smear     Status: None (Preliminary result)   Collection Time: 12/02/16  2:25 PM  Result Value Ref Range Status   Specimen Description CSF  Final   Special Requests NONE  Final   Culture NO FUNGUS ISOLATED AFTER 3 DAYS  Final   Report Status PENDING  Incomplete  Culture, bal-quantitative      Status: None (Preliminary result)   Collection Time: 12/05/16  9:49 AM  Result Value Ref Range Status   Specimen Description BRONCHIAL ALVEOLAR LAVAGE  Final   Special Requests NONE  Final   Gram Stain NO WBC SEEN NO ORGANISMS SEEN  Final   Culture PENDING  Incomplete   Report Status PENDING  Incomplete  Pneumocystis smear by DFA     Status: None   Collection Time: 12/05/16  9:49 AM  Result Value Ref Range Status   Specimen Source-[facility] BRONCHIAL ALVEOLAR LAVAGE  Final   Pneumocystis jiroveci Ag NEGATIVE  Final    Comment: Performed at [facility]    Impression/Plan:  1. Meningitis - no clear etiology to date.  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   Continue vanco/ceftriaxone No parasites on BAL  2.  HIV - CD4 over 200 at [place] so I suspect low CD4 here is transient/steroid related and not true immunosuppression.    3.  Hepatitis B - positive surface Ag, on [place].

## 2016-12-06 NOTE — Progress Notes (Signed)
CRITICAL VALUE ALERT  Critical value received:  CSF WBC 600  Date of notification:  12/06/16  Time of notification:  1847  Critical value read back:Yes.    Nurse who received alert:  Cinda QuestHaleigh RN  MD notified (1st page):  Dr. Nelson ChimesAmin   Time of first page:  1849  Time MD responded:  [phone]

## 2016-12-06 NOTE — Progress Notes (Signed)
I called and updated his family, including his mother, brother and sister. I discussed the findings of HSV in his CSF and otherwise unclear etiology of the meningitis as we have not been able to isolate any other organisms. Also discussed with them that he does still have a lot of agitation on wake up assessments and that we are maintaining intubation for protection of airway with the encephalopathy. Also discussed with them the MRI findings of the small acute infarcts that may be related to meningitis but also have an echo pending. Given his overall picture and tests that are still pending we can continue his current course. The family agreed to discuss again in a couple of days his progress and decide direction of care.  Lawrence BasilJennifer Masako Overall, MD  Internal Medicine PGY-3 Pager: [phone]

## 2016-12-06 NOTE — Procedures (Signed)
Lumbar Puncture Procedure Note  Pre-operative Diagnosis: Acute encephalopathy  Post-operative Diagnosis: same  Indications: Meningitis  Procedure Details  Consent: Informed consent was obtained. Risks of the procedure were discussed including: infection, bleeding, pain.  The patient was positioned. The area was prepped with chlorhexidine and draped in the usual sterile fashion.  1% plain lidocaine was used to anesthetize the L4-L5 innerspace. Using landmarks, a 22 guage spinal needle was inserted in the L4-L5 innerspace. The stylet was removed and the opening pressure was measured at 19 cm of water. Fluid was obtained without any difficulties and minimal blood loss.  A dressing was applied to the wound.   Findings About 10 ml of yellow, slightly cloudy cerebralspinal fluid was obtained. A sample was sent for cell counts, as well as for infection analysis.  Complications:  None; patient tolerated the procedure well.          Condition: stable   I was present for the entire procedure.   Pollie MeyerJ. Angelo A de Dios, MD 12/06/2016, 4:28 PM [facility] Pager [phone] After 3 pm or if no answer, call [phone]

## 2016-12-06 NOTE — Progress Notes (Signed)
PULMONARY / CRITICAL CARE MEDICINE   Name: Lawrence Kane MRN: [id number] DOB: 12-Jul-1966    ADMISSION DATE:  12/01/2016 CONSULTATION DATE:  12/01/2016  REFERRING MD:  [name] transfer  CHIEF COMPLAINT:  AMS  HISTORY OF PRESENT ILLNESS:   51 year old man with HIV/AIDS (first diagnosed 2003, last CD4 count 245 with viremia of 1,160 in October 2017; on Genovya, Darunavir and Bactrim), Genital Warts, COPD, and cocaine substance abuse who presented to [facility] on 1/24 with complaint of altered mental status. Per notes, patient may have recently had some type of surgery in [place] where his mother resides. Afebrile, tachycardic to 155, RR 18, hypertensive to 240/112. Patient was intubated for airway protection, CVL in IJ was placed, and LP was performed given his altered mental status. He was started on Versed and propofol for sedation. Initial labs showed leukocytosis of 13.9, hemoglobin 10.9, hct 33.6 and plts 269. BMET showed Sodium 139, K 3.9, Cl 101, Bicarb 21, BUN 23, Creatine 1.10, glucose 163. ABG showed pH of 7.28, pCO2 48, pO2 158, on FiO2 of 40%, PEEP 5, TV 450 and RR 18. Lactic Acid 3.3, 6.9. AST 125, ALT 224, and Alk Phos 88. UA negative for infection. UDS positive for cocaine. CSF appeared yellow, cloudy, xanthochromic, WBC 4845, RBC 625, glucose <20, total protein >300, Neutrophils 16%, Eos 64%, monocytes 17%, lymphocytes 3%. Gram stain performed, but unable to view results. ID was consulted. Dr. Baxter Flattery recommended continuing Truvada and Tivicay through NGT  SUBJECTIVE:  Remains sedated/intubated  VITAL SIGNS: BP 123/62   Pulse 67   Temp 99.8 F (37.7 C) (Oral)   Resp 20   Ht _0  (1.93 m)   Wt 233 lb 14.5 oz (106.1 kg)   SpO2 98%   BMI 28.47 kg/m   HEMODYNAMICS:    VENTILATOR SETTINGS: Vent Mode: PRVC FiO2 (%):  [30 %-40 %] 30 % Set Rate:  [16 bmp] 16 bmp Vt Set:  [620 mL] 620 mL PEEP:  [5 cmH20] 5 cmH20 Plateau Pressure:  [12 cmH20-18 cmH20] 12  cmH20  INTAKE / OUTPUT: I/O last 3 completed shifts: In: 7610.6 [I.V.:2070.6; Other:20; NG/GT:3365; IV NIDPOEUMP:5361] Out: 4431 [Urine:7451]  PHYSICAL EXAMINATION: Gen:      No acute distress HEENT:  EOMI, sclera anicteric, condyloma around month Neck:     No masses; no thyromegaly Lungs:    Clear to auscultation bilaterally; normal respiratory effort CV:         Regular rate and rhythm; no murmurs Abd:      + bowel sounds; soft, non-tender; no palpable masses, no distension Ext:    No edema; adequate peripheral perfusion Skin:      Warm and dry; no rash Neuro: alert and oriented x 3 Psych: normal mood and affect Scrotal were noted with serosanguineous discharge  LABS:  BMET  Recent Labs Lab 12/04/16 0211 12/05/16 0431 12/06/16 0417  NA 138 139 139  K 4.9 4.4 4.8  CL 104 105 102  CO2 _1 BUN 30* 31* 23*  CREATININE 1.09 1.00 0.96  GLUCOSE 207* 314* 261*    Electrolytes  Recent Labs Lab 12/04/16 0211 12/05/16 0431 12/06/16 0417  CALCIUM 8.4* 8.5* 8.2*  MG 2.3 2.3 2.0  PHOS 4.3 3.7 3.3    CBC  Recent Labs Lab 12/04/16 0459 12/05/16 0431 12/06/16 0417  WBC 10.4 10.2 12.1*  HGB 7.8* 8.3* 8.2*  HCT 24.5* 26.0* 26.1*  PLT 201 212 206    Coag's No results for input(s): APTT,  INR in the last 168 hours.  Sepsis Markers  Recent Labs Lab 12/02/16 0009 12/02/16 0530  LATICACIDVEN 1.3 1.3    ABG  Recent Labs Lab 12/02/16 1114  PHART 7.464*  PCO2ART 33.5  PO2ART 131.0*    Liver Enzymes  Recent Labs Lab 12/03/16 0425 12/04/16 0211 12/05/16 1517  AST 128* 150* 145*  ALT 149* 173* 203*  ALKPHOS 39 38 44  BILITOT 0.6 0.4 0.5  ALBUMIN 2.0* 2.0* 1.9*    Cardiac Enzymes  Recent Labs Lab 12/03/16 2152 12/04/16 0211 12/04/16 0811  TROPONINI 0.04* 0.04* <0.03    Glucose  Recent Labs Lab 12/05/16 0834 12/05/16 1149 12/05/16 1647 12/05/16 1954 12/05/16 2354 12/06/16 0344  GLUCAP 295* 261* 263* 258* 235* 262*     Imaging Dg Chest Port 1 View  Result Date: 12/06/2016 CLINICAL DATA:  Intubation. EXAM: PORTABLE CHEST 1 VIEW COMPARISON:  Yesterday FINDINGS: Endotracheal tube tip is at the clavicular heads. An orogastric tube reaches the stomach. Stable left neck central line positioning. Presuming venous placement the tip is in the region of the left brachiocephalic vein. Improved left basilar aeration with better visualized diaphragm. Patchy interstitial and airspace opacities at both bases. No pneumothorax or pleural effusion. Normal heart size. IMPRESSION: 1. Stable positioning of tubes and central line. 2. Bilateral pneumonia and/or atelectasis. Improved inflation at the left base compared yesterday. Electronically Signed   By: Monte Fantasia M.D.   On: 12/06/2016 06:48     STUDIES:  CXR 1/24 >> ATX vs LLL infiltrate CT head 1/25 >> neg for acute intracranial process LP cytology 1/25 >> CSF crypto ag >> neg LP >> yellow, cloudy, glucose 20, total protein >600, RBC 114>49, WBC 970, 36% neutrophil, 20% lymphs, 19% eos HIV quant 1/25 >> 80 CD4 1/25 >> 10 HCV ab + 1/28 BAL cell count> 1/28 BAL cytology 1/27 BAL for wet mount 1/27 MRI brain >> small acute infarcts in cerebellum and cerebral cortex, sucal signal   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 1/24 Cryptococcal [name]): negative 1/24 CSF [facility]): NGTD 1/22 scrotal wound cx [name]): MRSA Toxoplasma [name]): Negative 1/25 BCx x2 1/25: 1/24 Trach asp 1/24:  1/24 MRSA PCR:  1/25 CSF AFB: 1/25 fungal cx CSF: NGTD 1/25 CSF gram stain no organisms, cx >> NGTD 1/27 strongyloides ab >> 1/27 coccidiomycosis ab >> 1/28 BAL AFB smear/cx> 1/28 BAL Fungus cx 1/28 BAL gram stain/Cx 1/28 BAL pneumocystis smear 1/27 O&P   ANTIBIOTICS: Rocephin 1/24>> Vancomycin 1/24>> Acyclovir 1/24>> 1/26, 1/27>> Ampicillin 1/24>> 1/27 Bactrim M/W/F for PCP prophylaxis  SIGNIFICANT EVENTS: 1/24>  Transferred from [facility] 1/25> Underwent LP here  LINES/TUBES: L IJ CVL 1/24 >> ETT 1/24 >> Foley 1/24 >> OGT 1/24 >>  DISCUSSION: 51 year old man with HIV/AIDS (first diagnosed 2003, last CD4 count 245 with viremia of 1,160 in October 2017; on Genovya, Darunavir and Bactrim), Genital Warts, COPD, and cocaine substance abuse who presented to [facility] on 1/24 with altered mental status. Intubated for airway protection.   ASSESSMENT / PLAN:  PULMONARY A: Acute Respiratory Failure 2/2 Underlying Sepsis and Meningitis- Intubated for airway protection Possible LLL PNA P:   Continue full vent support Daily wake up assessment Antibiotics as per ID section  CARDIOVASCULAR A:  Sinus Tachycardia Incomplete RBBB HTN P:  Hydralazine prn Echo  RENAL A:   Lactic Acidosis, resolved P:   Follow labs, urine output, creatinine  GASTROINTESTINAL A:   Transaminitis HCV ab+ P:   Continue  tube feeds Follow LFTs Famotidine 20 mg IV BID  HEMATOLOGIC A:   Mild leukocytosis > improving Mild anemia P:  Sub Q heparin  INFECTIOUS A:   Bacterial meningitis, possible parasitic given elevated eosinophils, also would consider coccidiodes 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 Jacques Earthly.   I agree with the resident/APP's  history, physical examination, assessment, and plans.    I have edited the above note and modified it according to our agreed history, physical examination, assessment and plan.   Briefly, pt with HIV, admitted with AMS. Intubated for airway protection.  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 S/P LP on 1/25 with elevated OP.  Not on pressors.  Failed PST this am 2/2 agitation.   Vitals:  Vitals:   12/06/16 1000 12/06/16 1010 12/06/16 1100 12/06/16 1126  BP: 130/72  (!) 106/54 (!) 106/54  Pulse: 81 80 69 64  Resp: 18 17 (!) 21 19  Temp:      TempSrc:      SpO2: 97% 95% 93% 96%  Weight:      Height:        Constitutional/General:  Pleasant, well-nourished, well-developed, not in any distress,  Comfortably seating.  Well kempt  Body mass index is 28.47 kg/m. Wt Readings from Last 3 Encounters:  12/06/16 106.1 kg (233 lb 14.5 oz)  11/28/16 111.1 kg (245 lb)  11/06/16 108.9 kg (240 lb)    HEENT: Pupils equal and reactive to light and accommodation. Anicteric sclerae. Normal nasal mucosa.  ETT in place.    Neck: No masses. Midline trachea. No JVD, (-) LAD. (-) bruits appreciated.  Respiratory/Chest: Grossly normal chest. (-) deformity. (-) Accessory muscle use.  Symmetric expansion. (-) Tenderness on palpation.  Resonant on percussion.  Diminished BS on both lower lung zones. (-) wheezing,rhonchi. Crackles at bases.  (-) egophony  Cardiovascular: Regular rate and  rhythm, heart sounds normal, no murmur or gallops, gr 1-2  peripheral  edema  Gastrointestinal:  Normal bowel sounds. Soft, non-tender. No hepatosplenomegaly.  (-) masses.   Musculoskeletal:  Normal muscle tone.  Extremities: Grossly normal. (-) clubbing, cyanosis.  (-) edema  Skin: (-) rash,lesions seen.   Neurological/Psychiatric : sedated.   CBC Recent Labs     12/04/16  0459  12/05/16  0431  12/06/16  0417  WBC  10.4  10.2  12.1*  HGB  7.8*  8.3*  8.2*  HCT  24.5*  26.0*  26.1*  PLT  201  212  206    Coag's No results for input(s): APTT, INR in the last 72 hours.  BMET Recent Labs     12/04/16  0211  12/05/16  0431  12/06/16  0417  NA  138  139  139  K  4.9  4.4  4.8  CL  104  105  102  CO2  _0 BUN  30*  31*  23*  CREATININE  1.09  1.00  0.96  GLUCOSE  207*  314*  261*    Electrolytes Recent Labs     12/04/16  0211  12/05/16  0431  12/06/16  0417  CALCIUM  8.4*  8.5*  8.2*  MG  2.3  2.3  2.0  PHOS  4.3  3.7  3.3    Sepsis Markers No results for input(s): PROCALCITON, O2SATVEN in the last 72 hours.  Invalid input(s): LACTICACIDVEN  ABG No results for input(s): PHART, PCO2ART, PO2ART in the last 72 hours.  Liver Enzymes Recent Labs     12/04/16  0211  12/05/16  1517  AST  150*  145*  ALT  173*  203*  ALKPHOS  38  44  BILITOT  0.4  0.5  ALBUMIN  2.0*  1.9*    Cardiac Enzymes Recent Labs     12/03/16  2152  12/04/16  0211  12/04/16  0811  TROPONINI  0.04*  0.04*  <0.03    Glucose Recent Labs     12/05/16  1954  12/05/16  2354  12/06/16  0344  12/06/16  0815  12/06/16  0952  12/06/16  1104  GLUCAP  258*  235*  262*  251*  196*  220*    Imaging Mr Brain W Wo Contrast  Result Date: 12/04/2016 CLINICAL DATA:  Acute encephalopathy.  HIV and substance abuse. EXAM: MRI HEAD WITHOUT AND WITH CONTRAST TECHNIQUE: Multiplanar, multiecho pulse sequences of the brain and surrounding structures were obtained without and with intravenous contrast. CONTRAST:  56m MULTIHANCE GADOBENATE  DIMEGLUMINE 529 MG/ML IV SOLN COMPARISON:  Two days ago FINDINGS: Brain: Small foci of restricted diffusion which appear parenchymal and do not have rim enhancement, most consistent with acute infarcts. Clustered small infarcts seen in the left more than right cerebellar tonsils and inferior vermis. Small bilateral inferior frontal cortical infarcts. Small infarcts in the bilateral upper insula and on the left frontal and right occipital convexities. On FLAIR imaging there is hyperintensity following sulci which could be related to patient's meningitis (debris) or intubation with increased FiO2. No debris seen within the ventricular system. No hydrocephalus. Vascular: Major vessels are patent. Symmetric prominence of the superior ophthalmic veins which may be related to Valsalva as the cavernous sinus region is unremarkable. The right cervical and skullbase ICA is smaller than the left. This may be related to hypoplastic right A1 segment as there is also matching baseline canal asymmetry on 08/27/2015 head CT. Skull and upper cervical spine: No visible discitis. Sinuses/Orbits: Remote blowout fracture of the medial wall left orbit. Fluid levels throughout the paranasal sinuses. Patient is intubated. No visible intracranial extension to explain meningitis history. IMPRESSION: 1. 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 This pattern could be related to vasospasm from the patient's meningitis, or from central/cardiac embolic disease. 2. Sulcal signal could be meningitis debris or artifact from elevated FiO2. Negative for intracranial collection or intraventricular debris. 3.  History of HSV positive CSF PCR. No typical temporal/limbic encephalitis pattern. 4. Generalized sinusitis and mastoiditis. Electronically Signed   By: Monte Fantasia M.D.   On: 12/04/2016 18:00   Dg Chest Port 1 View  Result Date: 12/06/2016 CLINICAL DATA:  Intubation. EXAM: PORTABLE CHEST 1 VIEW COMPARISON:  Yesterday  FINDINGS: Endotracheal tube tip is at the clavicular heads. An orogastric tube reaches the stomach. Stable left neck central line positioning. Presuming venous placement the tip is in the region of the left brachiocephalic vein. Improved left basilar aeration with better visualized diaphragm. Patchy interstitial and airspace opacities at both bases. No pneumothorax or pleural effusion. Normal heart size. IMPRESSION: 1. Stable positioning of tubes and central line. 2. Bilateral pneumonia and/or atelectasis. Improved inflation at the left base compared yesterday. Electronically Signed   By: Monte Fantasia M.D.   On: 12/06/2016 06:48   Dg Chest Port 1 View  Result Date: 12/05/2016 CLINICAL DATA:  Acute respiratory failure. EXAM: PORTABLE CHEST 1 VIEW COMPARISON:  12/04/2016 FINDINGS: Support apparatus is unchanged. Cardiomediastinal silhouette is normal. Mediastinal contours appear intact. There is no evidence of pleural effusion or pneumothorax. Improvement in the aeration of the lungs with residual peribronchial opacities predominantly in the left lower lobe. Osseous structures are without acute abnormality. Soft tissues are grossly normal. IMPRESSION: Improved aeration of the lungs with residual peribronchial opacities predominantly in the left lower lobe. Stable support apparatus. Electronically Signed   By: Fidela Salisbury M.D.   On: 12/05/2016 07:27   Assessment/Plan: 1. Meningitis, bacterial and viral.  CSF (+) for HSV PCR. ID following. CD4 cnt 10 from Normal days ago. Viral load 80. Cont rocephin, vanc, acyclovir for now.  There was concerne for parasites but BAL (-) so far.  Plan for LP to check pressures.   2. Acute respiratory failure 2/2 unable to protect airway. Cont vent support.   3. Transaminitis 2/2 chronic hep B, likely hep C.   4. CVA small in cerebellum and cortex. Observe. Check echo. On ASA.    I spent  30 minutes of Critical Care time with this patient today. This is my time  spent independent of the APP or resident.   Family :.  No family at bedside.    Monica Becton, MD 12/06/2016, 11:52 AM [facility] Pager [phone] After 3 pm or if no answer, call [phone]

## 2016-12-06 NOTE — Progress Notes (Signed)
  Echocardiogram 2D Echocardiogram has been performed.  Nolon RodBrown, Tony 12/06/2016, 12:57 PM

## 2016-12-07 ENCOUNTER — Inpatient Hospital Stay (HOSPITAL_COMMUNITY): Payer: Medicaid Other

## 2016-12-07 DIAGNOSIS — I639 Cerebral infarction, unspecified: Secondary | ICD-10-CM

## 2016-12-07 LAB — MISC LABCORP TEST (SEND OUT): LABCORP TEST CODE: 16400

## 2016-12-07 LAB — CBC
HCT: 23.4 % — ABNORMAL LOW (ref 39.0–52.0)
Hemoglobin: 7.5 g/dL — ABNORMAL LOW (ref 13.0–17.0)
MCH: 31.5 pg (ref 26.0–34.0)
MCHC: 32.1 g/dL (ref 30.0–36.0)
MCV: 98.3 fL (ref 78.0–100.0)
PLATELETS: 201 10*3/uL (ref 150–400)
RBC: 2.38 MIL/uL — ABNORMAL LOW (ref 4.22–5.81)
RDW: 17.5 % — AB (ref 11.5–15.5)
WBC: 7.9 10*3/uL (ref 4.0–10.5)

## 2016-12-07 LAB — CULTURE, BLOOD (ROUTINE X 2)
CULTURE: NO GROWTH
Culture: NO GROWTH

## 2016-12-07 LAB — GLUCOSE, CAPILLARY
GLUCOSE-CAPILLARY: 220 mg/dL — AB (ref 65–99)
GLUCOSE-CAPILLARY: 243 mg/dL — AB (ref 65–99)
GLUCOSE-CAPILLARY: 260 mg/dL — AB (ref 65–99)
GLUCOSE-CAPILLARY: 269 mg/dL — AB (ref 65–99)
GLUCOSE-CAPILLARY: 216 mg/dL — AB (ref 65–99)
GLUCOSE-CAPILLARY: 174 mg/dL — AB (ref 65–99)
GLUCOSE-CAPILLARY: 184 mg/dL — AB (ref 65–99)
GLUCOSE-CAPILLARY: 99 mg/dL (ref 65–99)
Glucose-Capillary: 81 mg/dL (ref 65–99)
Glucose-Capillary: 230 mg/dL — ABNORMAL HIGH (ref 65–99)
Glucose-Capillary: 219 mg/dL — ABNORMAL HIGH (ref 65–99)
Glucose-Capillary: 227 mg/dL — ABNORMAL HIGH (ref 65–99)
Glucose-Capillary: 201 mg/dL — ABNORMAL HIGH (ref 65–99)
Glucose-Capillary: 182 mg/dL — ABNORMAL HIGH (ref 65–99)
Glucose-Capillary: 204 mg/dL — ABNORMAL HIGH (ref 65–99)
Glucose-Capillary: 104 mg/dL — ABNORMAL HIGH (ref 65–99)

## 2016-12-07 LAB — COMPREHENSIVE METABOLIC PANEL
ALT: 208 U/L — AB (ref 17–63)
AST: 146 U/L — ABNORMAL HIGH (ref 15–41)
Albumin: 1.6 g/dL — ABNORMAL LOW (ref 3.5–5.0)
Alkaline Phosphatase: 44 U/L (ref 38–126)
Anion gap: 7 (ref 5–15)
BILIRUBIN TOTAL: 0.2 mg/dL — AB (ref 0.3–1.2)
BUN: 27 mg/dL — AB (ref 6–20)
CHLORIDE: 100 mmol/L — AB (ref 101–111)
CO2: 29 mmol/L (ref 22–32)
CREATININE: 1.05 mg/dL (ref 0.61–1.24)
Calcium: 7.9 mg/dL — ABNORMAL LOW (ref 8.9–10.3)
GFR calc Af Amer: >60 mL/min (ref >60)
GLUCOSE: 242 mg/dL — AB (ref 65–99)
Potassium: 4.5 mmol/L (ref 3.5–5.1)
Sodium: 136 mmol/L (ref 135–145)
Total Protein: 5.8 g/dL — ABNORMAL LOW (ref 6.5–8.1)

## 2016-12-07 LAB — CSF CELL COUNT WITH DIFFERENTIAL
Eosinophils, CSF: 28 % — ABNORMAL HIGH (ref 0–1)
Lymphs, CSF: 68 % (ref 40–80)
MONOCYTE-MACROPHAGE-SPINAL FLUID: 4 % — AB (ref 15–45)
RBC COUNT CSF: 345 /mm3 — AB
TUBE #: 1
WBC CSF: 600 /mm3 — AB (ref 0–5)

## 2016-12-07 LAB — CULTURE, BAL-QUANTITATIVE W GRAM STAIN

## 2016-12-07 LAB — MAGNESIUM: Magnesium: 1.9 mg/dL (ref 1.7–2.4)

## 2016-12-07 LAB — PHOSPHORUS: Phosphorus: 2.9 mg/dL (ref 2.5–4.6)

## 2016-12-07 LAB — CULTURE, BAL-QUANTITATIVE

## 2016-12-07 LAB — PATHOLOGIST SMEAR REVIEW

## 2016-12-07 LAB — VANCOMYCIN, TROUGH: VANCOMYCIN TR: 19 ug/mL (ref 15–20)

## 2016-12-07 MED ORDER — INSULIN GLARGINE 100 UNIT/ML ~~LOC~~ SOLN
8.0000 [IU] | Freq: Every day | SUBCUTANEOUS | Status: DC
  Administered 2016-12-07: 8 [IU] via SUBCUTANEOUS
  Filled 2016-12-07: qty 0.08

## 2016-12-07 MED ORDER — FUROSEMIDE 10 MG/ML IJ SOLN
40.0000 mg | Freq: Once | INTRAMUSCULAR | Status: AC
  Administered 2016-12-07: 40 mg via INTRAVENOUS
  Filled 2016-12-07: qty 4

## 2016-12-07 MED ORDER — MEROPENEM 1 G IV SOLR
2.0000 g | Freq: Three times a day (TID) | INTRAVENOUS | Status: DC
  Administered 2016-12-07 – 2016-12-09 (×5): 2 g via INTRAVENOUS
  Filled 2016-12-07 (×6): qty 2

## 2016-12-07 MED ORDER — INSULIN ASPART 100 UNIT/ML ~~LOC~~ SOLN
0.0000 [IU] | SUBCUTANEOUS | Status: DC
  Administered 2016-12-07 (×3): 7 [IU] via SUBCUTANEOUS

## 2016-12-07 MED ORDER — INSULIN GLARGINE 100 UNIT/ML ~~LOC~~ SOLN: 5.0000 [IU] | Freq: Every day | SUBCUTANEOUS | Status: DC

## 2016-12-07 MED ORDER — SODIUM CHLORIDE 0.9 % IV SOLN
INTRAVENOUS | Status: DC
  Administered 2016-12-07: 1.6 [IU]/h via INTRAVENOUS
  Administered 2016-12-08: 6.8 [IU]/h via INTRAVENOUS
  Administered 2016-12-09: 6.9 [IU]/h via INTRAVENOUS
  Filled 2016-12-07 (×3): qty 2.5

## 2016-12-07 MED ORDER — CLONAZEPAM 0.5 MG PO TBDP
1.0000 mg | ORAL_TABLET | Freq: Two times a day (BID) | ORAL | Status: DC
  Administered 2016-12-07 – 2016-12-08 (×3): 1 mg
  Filled 2016-12-07 (×3): qty 2

## 2016-12-07 MED ORDER — SODIUM CHLORIDE 0.9% FLUSH: 10.0000 mL | INTRAVENOUS | Status: DC | PRN

## 2016-12-07 MED ORDER — ALTEPLASE 2 MG IJ SOLR
2.0000 mg | Freq: Once | INTRAMUSCULAR | Status: AC
  Administered 2016-12-07: 2 mg
  Filled 2016-12-07: qty 2

## 2016-12-07 MED ORDER — INSULIN ASPART 100 UNIT/ML ~~LOC~~ SOLN
4.0000 [IU] | SUBCUTANEOUS | Status: DC
  Administered 2016-12-07 (×3): 4 [IU] via SUBCUTANEOUS

## 2016-12-07 MED ORDER — QUETIAPINE FUMARATE 100 MG PO TABS
100.0000 mg | ORAL_TABLET | Freq: Two times a day (BID) | ORAL | Status: DC
Start: 2016-12-07 — End: 2016-12-08
  Administered 2016-12-07 (×2): 100 mg
  Filled 2016-12-07 (×3): qty 1

## 2016-12-07 NOTE — Progress Notes (Signed)
eLink Physician-Brief Progress Note Patient Name: Lawrence Kane DOB: 1966-11-07 MRN: [id number]   Date of Service  12/07/2016  HPI/Events of Note  Klebsiella Pneumonia on BAL Resistant to Ceftriaxone  eICU Interventions  Change to Zosyn     Intervention Category Evaluation Type: Other  [name] 12/07/2016, 10:22 PM

## 2016-12-07 NOTE — Progress Notes (Signed)
Pharmacy Antibiotic Note  Lawrence Kane is a 51 y.o. male admitted to [facility] on 12/01/2016 with c/o AMS. Patient now positive for HSV-2 meningitis, HIV, HBV, HCV, and MRSA scrotal wound. Pharmacy has been consulted for acyclovir, vanomcycin, and CTX dosing.   VT = 19 at goal this AM  Plan: -Continue vanc to 1250 q8h -Continue CTX 2g q12H, will sign off this consult as no adjustments needed for renal function -Continue acyclovir 1g q8h -Will f/u micro data, renal function, goals of care  Height: _0  (193 cm) Weight: 234 lb 5.6 oz (106.3 kg) IBW/kg (Calculated) : 86.8  Temp (24hrs), Avg:99.4 F (37.4 C), Min:98.2 F (36.8 C), Max:100.8 F (38.2 C)   Recent Labs Lab 12/02/16 0009 12/02/16 0530 12/03/16 0425 12/04/16 0211 12/04/16 0459 12/04/16 1300 12/05/16 0431 12/06/16 0417 12/07/16 0540  WBC 20.1* 17.2* 12.5*  --  10.4  --  10.2 12.1* 7.9  CREATININE 1.00 0.98 1.04 1.09  --   --  1.00 0.96 1.05  LATICACIDVEN 1.3 1.3  --   --   --   --   --   --   --   VANCOTROUGH  --   --   --   --   --  12*  --   --  19    Estimated Creatinine Clearance: 111.4 mL/min (by C-G formula based on SCr of 1.05 mg/dL).    Allergies  Allergen Reactions  . Iodine   . Ketorolac     unknown  . Tylenol [Acetaminophen]     unknown    Antimicrobials this admission: Ceftriaxone 1/24 (started at OSH) >> Vancomycin 1/24 (started at OSH) >> Ampicillin 1/25 >>1/27 Acyclovir 1/25 >>1/26; 1/27>> Bactrim 1/25 (PCP prophylaxis) >> 1/29 (CD4 >200 at [facility])  Dose adjustments this admission: VT - 12 1/27 on 1 g q8h VT - 19 1/30 on 1223m q8h: no changes  Microbiology results: 1/22 scrotal wound MRSA 1/24 BCx x 2 ([name]): 1/24 HSV PCR ([name]: POS 1/24 Cryptococcal ([name]: 1/24 CSF ([name]: gram stain no organism  1/24 Trach asp: ngtd 1/24 MRSA PCR: positive 1/25 BCx x2: ngtd 1/25 cryptococcal: negative 1/25 CSF culture (post abx): ngtd 1/25 CSF fungal Cx: neg 1/25  CSF AFB: neg 1/27 stronglyoids ab 1/27 ova + parasite 1/28 fungus from bronch 1/28 AFB bronch: neg 1/28 PCP bronch: neg 1/28 BAL: 40,000 klebsiella, no parasites 1/29 CSF: ngtd  [name], PharmD  PGY1 Pharmacy Resident Pager: [phone] 81171/30/2018 6:42 AM

## 2016-12-07 NOTE — Procedures (Signed)
ELECTROENCEPHALOGRAM REPORT  Date of Study: 12/07/2016  Patient's Name: Lawrence Kane E Andes MRN: [id number] Date of Birth: April 10, 1966  Referring Provider: Vania Reaavid R. Katrinka BlazingSmith, PA-C  Clinical History: 51 year old male with HIV/AIDs who presents for altered mental status, intubated for airway protection and found to have CSF positive for HSV.  Medications: acetaminophen (TYLENOL) tablet 650 mg  acyclovir (ZOVIRAX) 1,000 mg in dextrose 5 % 150 mL IVPB aspirin tablet 325 mg  cefTRIAXone (ROCEPHIN) 2 g in dextrose 5 % 50 mL IVPB  chlorproMAZINE (THORAZINE) 25 mg in sodium chloride 0.9 % 25 mL IVPB  clonazePAM (KLONOPIN) disintegrating tablet 1 mgdarunavir-cobicistat (PREZCOBIX) 800-150 MG per tablet 1 tablet  dolutegravir (TIVICAY) tablet 50 mg  emtricitabine-tenofovir AF (DESCOVY) 200-25 MG per tablet 1 tablet  fentaNYL (SUBLIMAZE)  hydrALAZINE (APRESOLINE) injection 10 mg  insulin regular (NOVOLIN R,HUMULIN R) 250 Units in midazolam (VERSED) injection 2 mg  propofol (DIPRIVAN) 1000 MG/100ML infusion  QUEtiapine (SEROQUEL) tablet 100 mg  rocuronium (ZEMURON) injection 100 mg   Technical Summary: A multichannel digital EEG recording measured by the international 10-20 system with electrodes applied with paste and impedances below 5000 ohms performed as portable with EKG monitoring in an intubated and sedated patient taking Fentanyl and propofol.  Hyperventilation and photic stimulation were not performed.  The digital EEG was referentially recorded, reformatted, and digitally filtered in a variety of bipolar and referential montages for optimal display.   Description: The patient is intubated and sedated on Fentanyl and propofol during the recording.  There is diffuse suppression of the background with is a symmetric 4-5 Hz theta and 2-3 Hz delta slowing and no discernible posterior dominant rhythm.  Stage 2 sleep is not seen.  There were no epileptiform discharges or electrographic seizures seen.     EKG lead was unremarkable.  Impression: This sedated EEG is abnormal due to diffuse slowing and suppression of the background.  Clinical Correlation of the above findings indicates diffuse cerebral dysfunction that is non-specific in etiology but likely secondary to pharmacologic effect.  However, such findings can be seen with hypoxic/ischemic injury, toxic/metabolic encephalopathies, or neurodegenerative disorders.  If further evaluation is required, consider repeating the EEG off of sedation.     Shon MilletAdam Jaffe, DO

## 2016-12-07 NOTE — Progress Notes (Signed)
PULMONARY / CRITICAL CARE MEDICINE   Name: Lawrence Kane MRN: [id number] DOB: Sep 02, 1966    ADMISSION DATE:  12/01/2016 CONSULTATION DATE:  12/01/2016  REFERRING MD:  [name] transfer  CHIEF COMPLAINT:  AMS  HISTORY OF PRESENT ILLNESS:   51 year old man with HIV/AIDS (first diagnosed 2003, last CD4 count 245 with viremia of 1,160 in October 2017; on Genovya, Darunavir and Bactrim), Genital Warts, COPD, and cocaine substance abuse who presented to [facility] on 1/24 with complaint of altered mental status. Per notes, patient may have recently had some type of surgery in [place] where his mother resides. Afebrile, tachycardic to 155, RR 18, hypertensive to 240/112. Patient was intubated for airway protection, CVL in IJ was placed, and LP was performed given his altered mental status. He was started on Versed and propofol for sedation. Initial labs showed leukocytosis of 13.9, hemoglobin 10.9, hct 33.6 and plts 269. BMET showed Sodium 139, K 3.9, Cl 101, Bicarb 21, BUN 23, Creatine 1.10, glucose 163. ABG showed pH of 7.28, pCO2 48, pO2 158, on FiO2 of 40%, PEEP 5, TV 450 and RR 18. Lactic Acid 3.3, 6.9. AST 125, ALT 224, and Alk Phos 88. UA negative for infection. UDS positive for cocaine. CSF appeared yellow, cloudy, xanthochromic, WBC 4845, RBC 625, glucose <20, total protein >300, Neutrophils 16%, Eos 64%, monocytes 17%, lymphocytes 3%. Gram stain performed, but unable to view results. ID was consulted. Dr. Baxter Flattery recommended continuing Truvada and Tivicay through NGT  SUBJECTIVE:  Remains sedated/intubated. Some agitation issues.  VITAL SIGNS: BP (!) 115/59   Pulse 65   Temp 99.8 F (37.7 C) (Oral)   Resp 15   Ht _0  (1.93 m)   Wt 234 lb 5.6 oz (106.3 kg)   SpO2 97%   BMI 28.53 kg/m   HEMODYNAMICS:    VENTILATOR SETTINGS: Vent Mode: PRVC FiO2 (%):  [30 %] 30 % Set Rate:  [14 bmp-16 bmp] 16 bmp Vt Set:  [620 mL] 620 mL PEEP:  [5 cmH20] 5 cmH20 Plateau Pressure:  [16  cmH20-22 cmH20] 16 cmH20  INTAKE / OUTPUT: I/O last 3 completed shifts: In: 7206.3 [I.V.:1921.3; Other:10; NG/GT:3095; IV [id number]:3704] Out: 5365 [UGQBV:6945]  PHYSICAL EXAMINATION: General Apperance: NAD HEENT: Normocephalic, atraumatic, anicteric sclera Neck: Supple, trachea midline Lungs: Clear to auscultation bilaterally. No wheezes. Heart: Regular rate and rhythm, no murmur/rub/gallop Abdomen: Soft, nontender, nondistended Extremities: Warm and well perfused, no edema Skin: Condyloma on lips Neurologic: Sedated. No gross deficits.  LABS:  BMET  Recent Labs Lab 12/05/16 0431 12/06/16 0417 12/07/16 0540  NA 139 139 136  K 4.4 4.8 4.5  CL 105 102 100*  CO2 _1 BUN 31* 23* 27*  CREATININE 1.00 0.96 1.05  GLUCOSE 314* 261* 242*    Electrolytes  Recent Labs Lab 12/05/16 0431 12/06/16 0417 12/06/16 1325 12/07/16 0540  CALCIUM 8.5* 8.2*  --  7.9*  MG 2.3 2.0 2.0 1.9  PHOS 3.7 3.3 3.3 2.9    CBC  Recent Labs Lab 12/05/16 0431 12/06/16 0417 12/07/16 0540  WBC 10.2 12.1* 7.9  HGB 8.3* 8.2* 7.5*  HCT 26.0* 26.1* 23.4*  PLT 212 206 201    Coag's  Recent Labs Lab 12/06/16 1215  INR 1.18    Sepsis Markers  Recent Labs Lab 12/02/16 0009 12/02/16 0530  LATICACIDVEN 1.3 1.3    ABG  Recent Labs Lab 12/02/16 1114  PHART 7.464*  PCO2ART 33.5  PO2ART 131.0*    Liver Enzymes  Recent Labs  Lab 12/04/16 0211 12/05/16 1517 12/07/16 0540  AST 150* 145* 146*  ALT 173* 203* 208*  ALKPHOS 38 44 44  BILITOT 0.4 0.5 0.2*  ALBUMIN 2.0* 1.9* 1.6*    Cardiac Enzymes  Recent Labs Lab 12/03/16 2152 12/04/16 0211 12/04/16 0811  TROPONINI 0.04* 0.04* <0.03    Glucose  Recent Labs Lab 12/06/16 2149 12/06/16 2243 12/06/16 2350 12/07/16 0101 12/07/16 0202 12/07/16 0408  GLUCAP 222* 112* 166* 81 220* 243*    Imaging Dg Chest Port 1 View  Result Date: 12/07/2016 CLINICAL DATA:  Intubated, shortness of breath, respiratory  failure EXAM: PORTABLE CHEST 1 VIEW COMPARISON:  12/06/2016 FINDINGS: Endotracheal tube is 5.4 cm above the carina. NG tube has retracted and is within the mid esophagus. Left jugular peripheral IV is stable in position. Stable heart size and vascularity. Mild streaky bibasilar atelectasis/airspace disease, worse on the left. No significant interval change. No large effusion or pneumothorax. Overall stable exam. IMPRESSION: Interval retraction of the NG tube which is within the mid esophagus. Persistent bibasilar atelectasis/patchy airspace process. Basilar pneumonia not excluded. No developing effusion or pneumothorax. Electronically Signed   By: Jerilynn Mages.  Shick M.D.   On: 12/07/2016 08:01     STUDIES:  1/24 CXR >> ATX vs LLL infiltrate 1/25 CT head >> neg for acute intracranial process 1/25 LP cytology >> no malignant cells 1/25 LP >> yellow, cloudy, glucose 20, total protein >600, RBC 114>49, WBC 970, 36% neutrophil, 20% lymphs, 19% eos 1/28 BAL cell count >> 266 WBC, eos 1 1/28 BAL cytology >> no malignant cells 1/27 BAL for wet mount> neg for parasites 1/27 MRI brain >> small acute infarcts in cerebellum and cerebral cortex, sucal signal 1/29 LP >> Glucose 82, protein >600, WBC 600>885, eosinophils 28% 1/29 Echo >> LV EF 60-65%, indeterminent diastolic function, wall motion normal   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 1/24 Cryptococcal [name]): negative 1/24 CSF [facility]): NGTD 1/22 scrotal wound cx [name]): MRSA Toxoplasma [name]): Negative 1/25 BCx x2 : NGTD 1/25 HCV ab + 1/24 Trach asp 1/24: NGTD 1/24 MRSA PCR: MRSA pos 1/25 HIV quant: 80, CD4 10 1/25 CSF AFB: Smear neg, >> 1/25 CSF fungal cx: NGTD 1/25 CSF crypto ag: neg 1/25 CSF gram stain no organisms, cx: NGTD 1/27 strongyloides ab >> 1/27 coccidiomycosis ab >> 1/28 BAL AFB smear/cx> 1/28 BAL Fungus cx >> 1/28 BAL gram stain/Cx: 40K Klebsiella pneumoniae 1/28 BAL  pneumocystis smear: Neg 1/27 O&P 1/29 CSF Cx >> no organisms on gram stain, >>   ANTIBIOTICS: Rocephin 1/24>> Vancomycin 1/24>> Acyclovir 1/24>> 1/26, 1/27>> Ampicillin 1/24>> 1/27 Bactrim M/W/F for PCP prophylaxis  SIGNIFICANT EVENTS: 1/24> Transferred from [facility] 1/25> Underwent LP here  LINES/TUBES: L IJ CVL 1/24 >> ETT 1/24 >> Foley 1/24 >> OGT 1/24 >>  DISCUSSION: 51 year old man with HIV/AIDS (first diagnosed 2003, last CD4 count 245 with viremia of 1,160 in October 2017; on Genovya, Darunavir and Bactrim), Genital Warts, COPD, and cocaine substance abuse who presented to [facility] on 1/24 with altered mental status. Intubated for airway protection.   ASSESSMENT / PLAN:  PULMONARY A: Acute Respiratory Failure 2/2 Underlying Sepsis and Meningitis- Intubated for airway protection LLL PNA>> Klebsiella  P:   Continue full vent support Daily wake up assessment Antibiotics as per ID section  CARDIOVASCULAR A:  Sinus Tachycardia Incomplete RBBB HTN P:  Hydralazine prn  RENAL A:   Lactic Acidosis, resolved P:   Follow labs, urine output, creatinine  GASTROINTESTINAL A:   Transaminitis HCV ab+, Chronic hep b P:   Continue tube feeds Follow LFTs Famotidine 20 mg IV BID  HEMATOLOGIC A:   Mild leukocytosis > improving Mild anemia P:  Sub Q heparin  INFECTIOUS A:   Bacterial meningitis, possible parasitic given elevated eosinophils, also would consider coccidiodes HSV positive on [facility] LLL PNA> Klebsiella  HIV Open Wound of Scrotum Hep B core antibody, surface ab, surface ag > consistent with unvaccinated status Hep C antibody reflex > +, Follow viral load P:   Toxoplasma neg M. 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 JJacques Earthly   I agree with the resident/APP's  history, physical examination, assessment, and plans.    I have edited the above note and modified it according to our agreed history, physical examination, assessment and plan.   Briefly, pt with HIV, admitted with AMS. Intubated for airway protection.  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 S/P LP on 1/25 with elevated OP. Not on pressors.  Failed PST this am 2/2 agitation. Pt had LP on 1/29 with better OP at 19 cm water. Kleb pna in BAL. 4.5L (+) since admission.   Vitals:  Vitals:   12/07/16 0700 12/07/16 0740 12/07/16 0800 12/07/16 0815  BP: 123/62 (!) 115/59 122/60   Pulse: 61 65 61   Resp: (!) _0 Temp:    (!) 100.4 F (38 C)  TempSrc:    Oral  SpO2: 100% 97% 100%   Weight:      Height:        Constitutional/General: well-nourished, well-developed, intubated, sedated, not in any distress  Body mass index is 28.53 kg/m. Wt Readings from Last 3 Encounters:  12/07/16 106.3 kg (234 lb 5.6 oz)  11/28/16 111.1 kg (245 lb)  11/06/16 108.9 kg (240 lb)    HEENT: PERLA, anicteric sclerae. (-) Oral thrush. Intubated, ETT in place  Neck: No masses. Midline trachea. No JVD, (-) LAD. (-) bruits appreciated.  Respiratory/Chest: Grossly normal chest. (-) deformity. (-) Accessory muscle use.   Symmetric expansion. Diminished BS on both lower lung zones. (-) wheezing, crackles, rhonchi (-) egophony  Cardiovascular: Regular rate and  rhythm, heart sounds normal, no murmur or gallops,  Trace peripheral edema  Gastrointestinal:  Normal bowel sounds. Soft, non-tender. No hepatosplenomegaly.  (-) masses.   Musculoskeletal:  Normal muscle tone.   Extremities: Grossly normal. (-) clubbing, cyanosis.  (-) edema  Skin: (-) rash,lesions seen.   Neurological/Psychiatric : sedated, intubated. CN grossly intact. (-) lateralizing signs.     CBC Recent Labs     12/05/16  0431  12/06/16  0417  12/07/16  0540  WBC  10.2  12.1*  7.9  HGB  8.3*  8.2*  7.5*  HCT  26.0*  26.1*  23.4*  PLT  212  206  201    Coag's Recent Labs     12/06/16  1215  INR  1.18    BMET Recent Labs     12/05/16  0431  12/06/16  0417  12/07/16  0540  NA  139  139  136  K  4.4  4.8  4.5  CL  105  102  100*  CO2  _0 BUN  31*  23*  27*  CREATININE  1.00  0.96  1.05  GLUCOSE  314*  261*  242*    Electrolytes Recent Labs     12/05/16  0431  12/06/16  0417  12/06/16  1325  12/07/16  0540  CALCIUM  8.5*  8.2*   --   7.9*  MG  2.3  2.0  2.0  1.9  PHOS  3.7  3.3  3.3  2.9    Sepsis Markers No results for input(s): PROCALCITON, O2SATVEN in the last 72 hours.  Invalid input(s): LACTICACIDVEN  ABG No results for input(s): PHART, PCO2ART, PO2ART in the last 72 hours.  Liver Enzymes Recent Labs     12/05/16  1517  12/07/16  0540  AST  145*  146*  ALT  203*  208*  ALKPHOS  44  44  BILITOT  0.5  0.2*  ALBUMIN  1.9*  1.6*    Cardiac Enzymes No results for input(s): TROPONINI, PROBNP in the last 72 hours.  Glucose Recent Labs     12/06/16  2243  12/06/16  2350  12/07/16  0101  12/07/16  0202  12/07/16  0408  12/07/16  0809  GLUCAP  112*  166*  81  220*  243*  230*    Imaging Dg Chest Port 1 View  Result Date: 12/07/2016 CLINICAL DATA:  Intubated, shortness  of breath, respiratory failure EXAM: PORTABLE CHEST 1 VIEW COMPARISON:  12/06/2016 FINDINGS: Endotracheal tube is 5.4 cm above the carina. NG tube has retracted and is within the mid esophagus. Left jugular peripheral IV is stable in position. Stable heart size and vascularity. Mild streaky bibasilar atelectasis/airspace disease, worse on the left. No significant interval change. No large effusion or pneumothorax. Overall stable exam. IMPRESSION: Interval retraction of the NG tube which is within the mid esophagus. Persistent bibasilar atelectasis/patchy airspace process. Basilar pneumonia not excluded. No developing effusion or pneumothorax. Electronically Signed   By: Jerilynn Mages.  Shick M.D.   On: 12/07/2016 08:01   Dg Chest Port 1 View  Result Date: 12/06/2016 CLINICAL DATA:  Intubation. EXAM: PORTABLE CHEST 1 VIEW COMPARISON:  Yesterday FINDINGS: Endotracheal tube tip is at the clavicular heads. An orogastric tube reaches the stomach. Stable left neck central line positioning. Presuming venous placement the tip is in the region of the left brachiocephalic vein. Improved left basilar aeration with better visualized diaphragm. Patchy interstitial and airspace opacities at both bases. No pneumothorax or pleural effusion. Normal heart size. IMPRESSION: 1. Stable positioning of tubes and central line. 2. Bilateral pneumonia and/or atelectasis. Improved inflation at the left base compared yesterday. Electronically Signed   By: Monte Fantasia M.D.   On: 12/06/2016 06:48   Dg Abd Portable 1v  Result Date: 12/07/2016 CLINICAL DATA:  Adjusted OG tube EXAM: PORTABLE ABDOMEN - 1 VIEW COMPARISON:  12/01/2016 FINDINGS: Enteric tube terminates in the proximal gastric body.  Nonobstructive bowel gas pattern. Visualized osseous structures are within normal limits. IMPRESSION: Enteric tube terminates in the proximal gastric body. Electronically Signed   By: Julian Hy M.D.   On: 12/07/2016 09:20   Assessment/Plan: 1.  Meningitis, bacterial and viral.  CSF (+) for HSV PCR. ID following. CD4 cnt 10 from 200+ on admission. Viral load 80. Cont rocephin, acyclovir. Will d/c vanc. There was concern for parasites but BAL (-) so far.  Holding off on ivermectin. BAL (+) for Klebsiella pneumonia, hopefully sensitive to Rocephin. Will consider switching to meropenem if klebsiella is resistant to rocephin or if he worsens clinically.  S/P LP on 1/29 with better OP, glc. Appreciate ID help!  2. Acute respiratory failure 2/2 unable to protect airway + mild pulm edema. Cont vent support. Gentle diuresis.   3. Transaminitis 2/2 chronic hep B, likely hep C.   4. CVA small in cerebellum and cortex. Observe. EF 60-65%. On ASA. Neuro to see. Appreciate input.   5. Agitation. Will increase seroquel to 100 mg BID and add clonazepam 1 mg BID.   I spent 30  minutes of Critical Care time with this patient today. This is my time spent independent of the APP or resident.   Family :Family updated at length on 1/29.    Monica Becton, MD 12/07/2016, 10:44 AM [facility] Pager [phone] After 3 pm or if no answer, call [phone]

## 2016-12-07 NOTE — Progress Notes (Signed)
Bedside EEG completed, results pending. 

## 2016-12-07 NOTE — Progress Notes (Addendum)
[facility]   Reason for visit: Follow up on meningitis  Interval History: remains sedated, intubated  CXR independently reviewed and about the same; Tmax 100.4; LP repeated and about the same except glucose now normalizing  Days of antibiotics 7 Day 7 ceftriaxone Day 7 vancomycin Day 6 acyclovir  Physical Exam: Constitutional:  Vitals:   12/07/16 0800 12/07/16 0815  BP: 122/60   Pulse: 61   Resp: 17   Temp:  (!) 100.4 F (38 C)  sedated Eyes: anicteric HENT: +ET Respiratory: coarse breath sounds; on vent Cardiovascular: RRR GI: soft, nt, nd  Review of Systems: Unable to be assessed due to mental status  Lab Results  Component Value Date   WBC 7.9 12/07/2016   HGB 7.5 (L) 12/07/2016   HCT 23.4 (L) 12/07/2016   MCV 98.3 12/07/2016   PLT 201 12/07/2016    Lab Results  Component Value Date   CREATININE 1.05 12/07/2016   BUN 27 (H) 12/07/2016   NA 136 12/07/2016   K 4.5 12/07/2016   CL 100 (L) 12/07/2016   CO2 29 12/07/2016    Lab Results  Component Value Date   ALT 208 (H) 12/07/2016   AST 146 (H) 12/07/2016   ALKPHOS 44 12/07/2016     Microbiology: Recent Results (from the past 240 hour(s))  MRSA PCR Screening     Status: Abnormal   Collection Time: 12/01/16 10:26 PM  Result Value Ref Range Status   MRSA by PCR POSITIVE (A) NEGATIVE Final    Comment:        The GeneXpert MRSA Assay (FDA approved for NASAL specimens only), is one component of a comprehensive MRSA colonization surveillance program. It is not intended to diagnose MRSA infection nor to guide or monitor treatment for MRSA infections. RESULT CALLED TO, READ BACK BY AND VERIFIED WITH: R DONAHUE,RN _0  12/02/16 MKELLY,MLT   Culture, respiratory (tracheal aspirate)     Status: None   Collection Time: 12/01/16 11:05 PM  Result Value Ref Range Status   Specimen Description TRACHEAL ASPIRATE  Final   Special Requests NONE  Final   Gram Stain   Final    FEW WBC PRESENT,BOTH PMN AND MONONUCLEAR NO ORGANISMS SEEN    Culture Consistent with normal respiratory flora.  Final   Report Status 12/04/2016 FINAL  Final  Culture, blood (routine x 2)     Status: None (Preliminary result)   Collection Time: 12/02/16 12:00 AM  Result Value Ref Range Status   Specimen Description BLOOD LEFT HAND  Final   Special Requests AEROBIC BOTTLE ONLY 10ML  Final   Culture NO GROWTH 4 DAYS  Final   Report Status PENDING  Incomplete  Culture, blood (routine x 2)     Status: None (Preliminary result)   Collection Time: 12/02/16 12:04 AM  Result Value Ref Range Status   Specimen Description BLOOD RIGHT HAND  Final   Special Requests BOTTLES DRAWN AEROBIC AND ANAEROBIC 5ML  Final   Culture NO GROWTH 4 DAYS  Final   Report Status PENDING  Incomplete  Acid Fast Smear (AFB)     Status: None   Collection Time: 12/02/16  9:00 AM  Result Value Ref Range Status   AFB Specimen Processing Concentration  Final   Acid Fast Smear Negative  Final    Comment: (NOTE) Performed At: [facility] [place], [place] [id number] Lindon Romp MD DX:[id number]    Source (AFB) ACID FAST BACILLI  Corrected  CSF culture     Status: None   Collection Time: 12/02/16  2:24 PM  Result Value Ref Range Status   Specimen Description CSF  Final   Special Requests NONE  Final   Gram Stain   Final    CYTOSPIN SMEAR WBC PRESENT,BOTH PMN AND MONONUCLEAR NO ORGANISMS SEEN    Culture NO GROWTH 3 DAYS  Final   Report Status 12/06/2016 FINAL  Final  Culture, fungus without smear     Status: None (Preliminary result)   Collection Time: 12/02/16  2:25 PM  Result Value Ref Range Status   Specimen Description CSF  Final   Special Requests NONE  Final   Culture NO FUNGUS ISOLATED AFTER 3 DAYS  Final   Report Status PENDING  Incomplete  Culture, bal-quantitative     Status: Abnormal (Preliminary result)   Collection Time: 12/05/16  9:49 AM  Result Value Ref Range  Status   Specimen Description BRONCHIAL ALVEOLAR LAVAGE  Final   Special Requests NONE  Final   Gram Stain NO WBC SEEN NO ORGANISMS SEEN  Final   Culture 40,000 COLONIES/mL KLEBSIELLA PNEUMONIAE (A)  Final   Report Status PENDING  Incomplete  Pneumocystis smear by DFA     Status: None   Collection Time: 12/05/16  9:49 AM  Result Value Ref Range Status   Specimen Source-PJSRC BRONCHIAL ALVEOLAR LAVAGE  Final   Pneumocystis jiroveci Ag NEGATIVE  Final    Comment: Performed at [facility]  CSF culture     Status: None (Preliminary result)   Collection Time: 12/06/16  4:54 PM  Result Value Ref Range Status   Specimen Description CSF  Final   Special Requests NONE  Final   Gram Stain   Final    WBC PRESENT,BOTH PMN AND MONONUCLEAR NO ORGANISMS SEEN CYTOSPIN    Culture PENDING  Incomplete   Report Status PENDING  Incomplete    Impression/Plan:  1. Meningitis - unclear etiology.  LP now about the same except much improved glucose.  Still with inflammation.  On ceftriaxone, acyclovir and vancomycin. Can d/c vancomycin  2.  Encephalopathy - may be related to #1.  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    3. transaminitis - stable with AST 146 and ALT 208.  He was off of Descovy at admission so some may be reactivation of hepatitis B. Continue descovy  4.  HIV - has been controlled on salvage regimen with Prezcobix, Tivicay and Descovy.  Continue.  Monitor LFTs. Recent CD4 over 200 so have stopped OI prophylaxis.  CD4 here in the setting of steroids.    5.  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  Strongyloides Ab sent.  If any clinical worsening, can consider ivermectin 200 mcg/kg daily.  Not typical though for this.  Typically diarrhea, pulmonary issues.

## 2016-12-07 NOTE — Progress Notes (Signed)
Pharmacy Antibiotic Note Lawrence Kane is a 51 y.o. male admitted to [place] on 12/01/2016 with c/o AMS. Positive for HSV-2 meningitis, HIV, HBV, HCV, and MRSA scrotal wound. BAL cx from 1/28 now growing ESBL K. Pneumoniae. Consulted for Zosyn but spoke with Dr. Mortimer Fries and due to ESBL organism and concern for meningitis will treat with meropenem.    Plan: - Meropenem 2 grams IV every 8 hours  - Continue acyclovir 1g q8h - Will f/u micro data, renal function, goals of care  Height: _0  (193 cm) Weight: 234 lb 5.6 oz (106.3 kg) IBW/kg (Calculated) : 86.8  Temp (24hrs), Avg:99.2 F (37.3 C), Min:98.2 F (36.8 C), Max:100.4 F (38 C)   Recent Labs Lab 12/02/16 0009 12/02/16 0530 12/03/16 0425 12/04/16 0211 12/04/16 0459 12/04/16 1300 12/05/16 0431 12/06/16 0417 12/07/16 0540  WBC 20.1* 17.2* 12.5*  --  10.4  --  10.2 12.1* 7.9  CREATININE 1.00 0.98 1.04 1.09  --   --  1.00 0.96 1.05  LATICACIDVEN 1.3 1.3  --   --   --   --   --   --   --   VANCOTROUGH  --   --   --   --   --  12*  --   --  19    Estimated Creatinine Clearance: 111.4 mL/min (by C-G formula based on SCr of 1.05 mg/dL).    Allergies  Allergen Reactions  . Iodine   . Ketorolac     unknown  . Tylenol [Acetaminophen]     unknown    Antimicrobials this admission:  Meropenem 1/30 >>   Acyclovir 1/25 >>1/26; 1/27>> Vancomycin 1/24 (started at OSH) >> 1/30 Ceftriaxone 1/24 (started at OSH) >> 1/30 Ampicillin 1/25 >>1/27 Bactrim 1/25 (PCP prophylaxis) >> 1/29 (CD4 >200 at [facility])  Dose adjustments this admission: VT - 12 1/27 on 1 g q8h VT - 19 1/30 on 1219m q8h: no changes  Microbiology results: 1/22 scrotal wound MRSA 1/24 BCx x 2 ([name]): 1/24 HSV PCR ([name]: POS 1/24 Cryptococcal ([name]: 1/24 CSF ([name]: gram stain no organism  1/24 Trach asp: ngtd 1/24 MRSA PCR: positive 1/25 BCx x2: ngtd 1/25 cryptococcal: negative 1/25 CSF culture (post abx): ngtd 1/25 CSF fungal Cx:  neg 1/25 CSF AFB: neg 1/27 stronglyoids ab 1/27 ova + parasite 1/28 fungus from bronch 1/28 AFB bronch: neg 1/28 PCP bronch: neg 1/28 BAL: 40,000 klebsiella ESBL 1/29 CSF: ngtd  AVincenza Hews PharmD, BCPS 12/07/2016, 10:34 PM

## 2016-12-07 NOTE — Consult Note (Addendum)
Requesting Physician: PCCM    Chief Complaint: Stroke  History obtained from:   Chart     HPI:                                                                                                                                         Lawrence Kane is an 51 y.o. male with HIV/AIDS (first diagnosed 2003, last CD4 count 245 with viremia of 1,160 in October 2017; on Genovya, Darunavir and Bactrim), Genital Warts, COPD, and cocaine substance abuse who presented to [facility] on 1/24 with complaint of altered mental status. Per notes, patient may have recently had some type of surgery in [place] where his mother resides. Afebrile, tachycardic to 155, RR 18, hypertensive to 240/112. Patient was intubated for airway protection. 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 Neurology was asked to evaluate patient.   Neurology asked to evaluate for possible cardio embolic versus vasculitis.   Date last known well: Unable to determine Time last known well: Unable to determine tPA Given: No: out of window   Past Medical History:  Diagnosis Date  . COPD (chronic obstructive pulmonary disease) ([place])   . Genital warts   . HIV (human immunodeficiency virus infection) ([place])   . Osteoporosis     Past Surgical History:  Procedure Laterality Date  . PROSTATE SURGERY  11/17/2016    No family history on file. Social History:  reports that he has been smoking.  He has never used smokeless tobacco. He reports that he drinks about 0.6 oz of alcohol per week . He reports that he does not use drugs.  Allergies:  Allergies  Allergen Reactions  . Iodine   . Ketorolac     unknown  . Tylenol [Acetaminophen]     unknown    Medications:                                                                                                                           Prior to Admission:  Prescriptions Prior to Admission   Medication Sig Dispense Refill Last Dose  . azithromycin (ZITHROMAX) 600 MG tablet Take 600 mg by mouth 3 (three) times a week.  3 Unknown at Unknown  . clindamycin (CLEOCIN) 300 MG capsule Take 300 mg by  mouth 3 (three) times daily as needed.    Unknown at Unknown  . doxycycline (VIBRAMYCIN) 100 MG capsule Take 1 capsule (100 mg total) by mouth 2 (two) times daily. 20 capsule 0 Unknown at Unknown  . gabapentin (NEURONTIN) 600 MG tablet Take 600 mg by mouth 3 (three) times daily.   Unknown at Unknown  . GENVOYA 150-150-200-10 MG TABS tablet Take 1 tablet by mouth daily.  3 Unknown at Unknown  . ipratropium-albuterol (DUONEB) 0.5-2.5 (3) MG/3ML SOLN Take 3 mLs by nebulization every 4 (four) hours as needed (shortness of breathe).   Unknown at Unknown  . PREZISTA 800 MG tablet Take 800 mg by mouth daily.  3 Unknown at Unknown  . TRUVADA 200-300 MG tablet Take 1 tablet by mouth daily.  5 Unknown at Unknown  . diphenhydrAMINE (SOMINEX) 25 MG tablet Take 25 mg by mouth every 6 (six) hours as needed for itching.   Unknown at Unknown  . oxyCODONE-acetaminophen (PERCOCET) 10-325 MG tablet Take 1 tablet by mouth every 6 (six) hours as needed for pain.  0 Unknown at Unknown  . polyvinyl alcohol (LIQUIFILM TEARS) 1.4 % ophthalmic solution Place 1 drop into both eyes daily as needed for dry eyes.   Unknown at Unknown   Scheduled: . acyclovir  1,000 mg Intravenous Q8H  . aspirin  325 mg Per Tube Daily  . cefTRIAXone (ROCEPHIN)  IV  2 g Intravenous Q12H  . chlorhexidine gluconate (MEDLINE KIT)  15 mL Mouth Rinse BID  . Chlorhexidine Gluconate Cloth  6 each Topical Q0600  . darunavir-cobicistat  1 tablet Oral Q breakfast  . dolutegravir  50 mg Oral Daily  . emtricitabine-tenofovir AF  1 tablet Per Tube Daily  . famotidine  20 mg Per Tube BID  . fentaNYL (SUBLIMAZE) injection  50 mcg Intravenous Once  . heparin  5,000 Units Subcutaneous Q8H  . insulin aspart  0-20 Units Subcutaneous Q4H  . insulin aspart   4 Units Subcutaneous Q4H  . insulin glargine  8 Units Subcutaneous Daily  . mouth rinse  15 mL Mouth Rinse 10 times per day  . mupirocin ointment  1 application Nasal BID  . QUEtiapine  100 mg Per Tube BID  . vancomycin  1,250 mg Intravenous Q8H    ROS:                                                                                                                                       History obtained from unobtainable from patient due to intubated    Neurologic Examination:  Blood pressure 122/60, pulse 61, temperature (!) 100.4 F (38 C), temperature source Oral, resp. rate 17, height 6' 4" (1.93 m), weight 106.3 kg (234 lb 5.6 oz), SpO2 100 %.  HEENT-  Normocephalic, no lesions, without obvious abnormality.  Normal external eye and conjunctiva.  Normal TM's bilaterally.  Normal auditory canals and external ears. Normal external nose, mucus membranes and septum.  Normal pharynx. Cardiovascular- S1, S2 normal, pulses palpable throughout   Lungs- chest clear, no wheezing, rales, normal symmetric air entry Abdomen- normal findings: bowel sounds normal Extremities- no edema Lymph-no adenopathy palpable Musculoskeletal-no joint tenderness, deformity or swelling Skin-warm and dry, no hyperpigmentation, vitiligo, or suspicious lesions  Neurological Examination Mental Status: Intubated and sedated on Propofol and Fentanyl. No response to pain and or verbal stimuli.  Cranial Nerves: II:  No blink to threat,  III,IV, VI: 1 mm PERRLA, no Dolls noted, corneal intact.  and accommodation, eyes are deviated upward bilaterally V,VII: face symmetric, facial light touch sensation normal bilaterally VIII: no response IX,X: unable to visualize  Motor: Flaccid throughout without seizure activity Sensory: no response to noxious stimuli Deep Tendon Reflexes: 1+ and symmetric  throughout Plantars: Mute bilaterally Cerebellar: Unable to obtain Gait: not tested       Lab Results: Basic Metabolic Panel:  Recent Labs Lab 12/03/16 0425 12/04/16 0211 12/05/16 0431 12/06/16 0417 12/06/16 1325 12/07/16 0540  NA 136 138 139 139  --  136  K 4.5 4.9 4.4 4.8  --  4.5  CL 107 104 105 102  --  100*  CO2 _0 --  29  GLUCOSE 236* 207* 314* 261*  --  242*  BUN 19 30* 31* 23*  --  27*  CREATININE 1.04 1.09 1.00 0.96  --  1.05  CALCIUM 8.1* 8.4* 8.5* 8.2*  --  7.9*  MG 2.2 2.3 2.3 2.0 2.0 1.9  PHOS 3.1 4.3 3.7 3.3 3.3 2.9    Liver Function Tests:  Recent Labs Lab 12/02/16 0009 12/03/16 0425 12/04/16 0211 12/05/16 1517 12/07/16 0540  AST 100* 128* 150* 145* 146*  ALT 146* 149* 173* 203* 208*  ALKPHOS 46 39 38 44 44  BILITOT 0.9 0.6 0.4 0.5 0.2*  PROT 6.6 6.3* 6.2* 6.3* 5.8*  ALBUMIN 2.4* 2.0* 2.0* 1.9* 1.6*   No results for input(s): LIPASE, AMYLASE in the last 168 hours. No results for input(s): AMMONIA in the last 168 hours.  CBC:  Recent Labs Lab 12/02/16 0009  12/03/16 0425 12/04/16 0459 12/05/16 0431 12/06/16 0417 12/07/16 0540  WBC 20.1*  < > 12.5* 10.4 10.2 12.1* 7.9  NEUTROABS 18.7*  --   --   --   --   --   --   HGB 9.0*  < > 8.0* 7.8* 8.3* 8.2* 7.5*  HCT 28.2*  < > 25.0* 24.5* 26.0* 26.1* 23.4*  MCV 97.2  < > 96.9 98.0 97.7 97.8 98.3  PLT 229  < > 218 201 212 206 201  < > = values in this interval not displayed.  Cardiac Enzymes:  Recent Labs Lab 12/03/16 2152 12/04/16 0211 12/04/16 0811  TROPONINI 0.04* 0.04* <0.03    Lipid Panel:  Recent Labs Lab 12/02/16 0009 12/03/16 2037 12/06/16 0417  TRIG 109 134 86    CBG:  Recent Labs Lab 12/06/16 2350 12/07/16 0101 12/07/16 0202 12/07/16 0408 12/07/16 0809  GLUCAP 166* 81 220* 243* 230*    Microbiology: Results for orders placed or performed during the hospital encounter of  12/01/16  MRSA PCR Screening     Status: Abnormal   Collection Time:  12/01/16 10:26 PM  Result Value Ref Range Status   MRSA by PCR POSITIVE (A) NEGATIVE Final    Comment:        The GeneXpert MRSA Assay (FDA approved for NASAL specimens only), is one component of a comprehensive MRSA colonization surveillance program. It is not intended to diagnose MRSA infection nor to guide or monitor treatment for MRSA infections. RESULT CALLED TO, READ BACK BY AND VERIFIED WITH: R DONAHUE,RN _0  12/02/16 MKELLY,MLT   Culture, respiratory (tracheal aspirate)     Status: None   Collection Time: 12/01/16 11:05 PM  Result Value Ref Range Status   Specimen Description TRACHEAL ASPIRATE  Final   Special Requests NONE  Final   Gram Stain   Final    FEW WBC PRESENT,BOTH PMN AND MONONUCLEAR NO ORGANISMS SEEN    Culture Consistent with normal respiratory flora.  Final   Report Status 12/04/2016 FINAL  Final  Culture, blood (routine x 2)     Status: None (Preliminary result)   Collection Time: 12/02/16 12:00 AM  Result Value Ref Range Status   Specimen Description BLOOD LEFT HAND  Final   Special Requests AEROBIC BOTTLE ONLY 10ML  Final   Culture NO GROWTH 4 DAYS  Final   Report Status PENDING  Incomplete  Culture, blood (routine x 2)     Status: None (Preliminary result)   Collection Time: 12/02/16 12:04 AM  Result Value Ref Range Status   Specimen Description BLOOD RIGHT HAND  Final   Special Requests BOTTLES DRAWN AEROBIC AND ANAEROBIC 5ML  Final   Culture NO GROWTH 4 DAYS  Final   Report Status PENDING  Incomplete  Acid Fast Smear (AFB)     Status: None   Collection Time: 12/02/16  9:00 AM  Result Value Ref Range Status   AFB Specimen Processing Concentration  Final   Acid Fast Smear Negative  Final    Comment: (NOTE) Performed At: [facility] [place], [place] [id number] Lindon Romp MD QI:[id number]    Source (AFB) ACID FAST BACILLI  Corrected  CSF culture     Status: None   Collection Time: 12/02/16  2:24 PM  Result  Value Ref Range Status   Specimen Description CSF  Final   Special Requests NONE  Final   Gram Stain   Final    CYTOSPIN SMEAR WBC PRESENT,BOTH PMN AND MONONUCLEAR NO ORGANISMS SEEN    Culture NO GROWTH 3 DAYS  Final   Report Status 12/06/2016 FINAL  Final  Culture, fungus without smear     Status: None (Preliminary result)   Collection Time: 12/02/16  2:25 PM  Result Value Ref Range Status   Specimen Description CSF  Final   Special Requests NONE  Final   Culture NO FUNGUS ISOLATED AFTER 3 DAYS  Final   Report Status PENDING  Incomplete  Culture, bal-quantitative     Status: Abnormal (Preliminary result)   Collection Time: 12/05/16  9:49 AM  Result Value Ref Range Status   Specimen Description BRONCHIAL ALVEOLAR LAVAGE  Final   Special Requests NONE  Final   Gram Stain NO WBC SEEN NO ORGANISMS SEEN  Final   Culture 40,000 COLONIES/mL KLEBSIELLA PNEUMONIAE (A)  Final   Report Status PENDING  Incomplete  Pneumocystis smear by DFA     Status: None   Collection Time: 12/05/16  9:49 AM  Result Value Ref  Range Status   Specimen Source-PJSRC BRONCHIAL ALVEOLAR LAVAGE  Final   Pneumocystis jiroveci Ag NEGATIVE  Final    Comment: Performed at [facility]  CSF culture     Status: None (Preliminary result)   Collection Time: 12/06/16  4:54 PM  Result Value Ref Range Status   Specimen Description CSF  Final   Special Requests NONE  Final   Gram Stain   Final    WBC PRESENT,BOTH PMN AND MONONUCLEAR NO ORGANISMS SEEN CYTOSPIN    Culture PENDING  Incomplete   Report Status PENDING  Incomplete    Coagulation Studies:  Recent Labs  12/06/16 1215  LABPROT 15.0  INR 1.18    Imaging: Dg Chest Port 1 View  Result Date: 12/07/2016 CLINICAL DATA:  Intubated, shortness of breath, respiratory failure EXAM: PORTABLE CHEST 1 VIEW COMPARISON:  12/06/2016 FINDINGS: Endotracheal tube is 5.4 cm above the carina. NG tube has retracted and is within the mid esophagus. Left  jugular peripheral IV is stable in position. Stable heart size and vascularity. Mild streaky bibasilar atelectasis/airspace disease, worse on the left. No significant interval change. No large effusion or pneumothorax. Overall stable exam. IMPRESSION: Interval retraction of the NG tube which is within the mid esophagus. Persistent bibasilar atelectasis/patchy airspace process. Basilar pneumonia not excluded. No developing effusion or pneumothorax. Electronically Signed   By: Jerilynn Mages.  Shick M.D.   On: 12/07/2016 08:01   Dg Chest Port 1 View  Result Date: 12/06/2016 CLINICAL DATA:  Intubation. EXAM: PORTABLE CHEST 1 VIEW COMPARISON:  Yesterday FINDINGS: Endotracheal tube tip is at the clavicular heads. An orogastric tube reaches the stomach. Stable left neck central line positioning. Presuming venous placement the tip is in the region of the left brachiocephalic vein. Improved left basilar aeration with better visualized diaphragm. Patchy interstitial and airspace opacities at both bases. No pneumothorax or pleural effusion. Normal heart size. IMPRESSION: 1. Stable positioning of tubes and central line. 2. Bilateral pneumonia and/or atelectasis. Improved inflation at the left base compared yesterday. Electronically Signed   By: Monte Fantasia M.D.   On: 12/06/2016 06:48   Dg Abd Portable 1v  Result Date: 12/07/2016 CLINICAL DATA:  Adjusted OG tube EXAM: PORTABLE ABDOMEN - 1 VIEW COMPARISON:  12/01/2016 FINDINGS: Enteric tube terminates in the proximal gastric body. Nonobstructive bowel gas pattern. Visualized osseous structures are within normal limits. IMPRESSION: Enteric tube terminates in the proximal gastric body. Electronically Signed   By: Julian Hy M.D.   On: 12/07/2016 09:20       Assessment and plan discussed with with attending physician and they are in agreement.    Etta Quill PA-C [facility] [phone]  12/07/2016, 10:24 AM   On my exam, the patient was sedated but  nurse did report that he was following commands earlier.   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 It is uncommon for HSV to  cause encephalitis, but it does happen from time to time. With his infarcts, I would be concerned about a possible HSV vasculopathy as opposed to cardiac embolus. I do think that further evaluation with an MR angiogram would be prudent, would prefer CT angio but he has an iodine allergy and we do not know the specifics.  Stroke Risk Factors - none  1) MRA of the head and neck 2) I'm not certain a TEE is indicated at the current time given his medical status 3) he is already on aspirin  4) stroke team to follow  Roland Rack,  MD [facility] [phone]  If 7pm- 7am, please page neurology on call as listed in [place].

## 2016-12-08 ENCOUNTER — Inpatient Hospital Stay (HOSPITAL_COMMUNITY): Payer: Medicaid Other

## 2016-12-08 DIAGNOSIS — D721 Eosinophilia: Secondary | ICD-10-CM

## 2016-12-08 DIAGNOSIS — I6340 Cerebral infarction due to embolism of unspecified cerebral artery: Secondary | ICD-10-CM | POA: Insufficient documentation

## 2016-12-08 DIAGNOSIS — Z21 Asymptomatic human immunodeficiency virus [HIV] infection status: Secondary | ICD-10-CM

## 2016-12-08 DIAGNOSIS — G0490 Encephalitis and encephalomyelitis, unspecified: Secondary | ICD-10-CM

## 2016-12-08 DIAGNOSIS — G039 Meningitis, unspecified: Secondary | ICD-10-CM

## 2016-12-08 DIAGNOSIS — Z79899 Other long term (current) drug therapy: Secondary | ICD-10-CM

## 2016-12-08 DIAGNOSIS — R740 Nonspecific elevation of levels of transaminase and lactic acid dehydrogenase [LDH]: Secondary | ICD-10-CM

## 2016-12-08 DIAGNOSIS — B009 Herpesviral infection, unspecified: Secondary | ICD-10-CM

## 2016-12-08 DIAGNOSIS — Z978 Presence of other specified devices: Secondary | ICD-10-CM

## 2016-12-08 LAB — GLUCOSE, CAPILLARY
GLUCOSE-CAPILLARY: 122 mg/dL — AB (ref 65–99)
GLUCOSE-CAPILLARY: 344 mg/dL — AB (ref 65–99)
GLUCOSE-CAPILLARY: 223 mg/dL — AB (ref 65–99)
GLUCOSE-CAPILLARY: 201 mg/dL — AB (ref 65–99)
GLUCOSE-CAPILLARY: 222 mg/dL — AB (ref 65–99)
GLUCOSE-CAPILLARY: 169 mg/dL — AB (ref 65–99)
GLUCOSE-CAPILLARY: 163 mg/dL — AB (ref 65–99)
Glucose-Capillary: 106 mg/dL — ABNORMAL HIGH (ref 65–99)
Glucose-Capillary: 139 mg/dL — ABNORMAL HIGH (ref 65–99)
Glucose-Capillary: 147 mg/dL — ABNORMAL HIGH (ref 65–99)
Glucose-Capillary: 148 mg/dL — ABNORMAL HIGH (ref 65–99)
Glucose-Capillary: 158 mg/dL — ABNORMAL HIGH (ref 65–99)
Glucose-Capillary: 160 mg/dL — ABNORMAL HIGH (ref 65–99)
Glucose-Capillary: 162 mg/dL — ABNORMAL HIGH (ref 65–99)
Glucose-Capillary: 165 mg/dL — ABNORMAL HIGH (ref 65–99)
Glucose-Capillary: 173 mg/dL — ABNORMAL HIGH (ref 65–99)
Glucose-Capillary: 173 mg/dL — ABNORMAL HIGH (ref 65–99)
Glucose-Capillary: 183 mg/dL — ABNORMAL HIGH (ref 65–99)
Glucose-Capillary: 193 mg/dL — ABNORMAL HIGH (ref 65–99)
Glucose-Capillary: 196 mg/dL — ABNORMAL HIGH (ref 65–99)
Glucose-Capillary: 224 mg/dL — ABNORMAL HIGH (ref 65–99)
Glucose-Capillary: 236 mg/dL — ABNORMAL HIGH (ref 65–99)
Glucose-Capillary: 332 mg/dL — ABNORMAL HIGH (ref 65–99)

## 2016-12-08 LAB — COMPREHENSIVE METABOLIC PANEL
ALBUMIN: 1.6 g/dL — AB (ref 3.5–5.0)
ALT: 342 U/L — ABNORMAL HIGH (ref 17–63)
AST: 320 U/L — ABNORMAL HIGH (ref 15–41)
Alkaline Phosphatase: 55 U/L (ref 38–126)
Anion gap: 6 (ref 5–15)
BILIRUBIN TOTAL: 0.6 mg/dL (ref 0.3–1.2)
BUN: 24 mg/dL — ABNORMAL HIGH (ref 6–20)
CALCIUM: 7.9 mg/dL — AB (ref 8.9–10.3)
CO2: 31 mmol/L (ref 22–32)
Chloride: 98 mmol/L — ABNORMAL LOW (ref 101–111)
Creatinine, Ser: 1.03 mg/dL (ref 0.61–1.24)
GLUCOSE: 161 mg/dL — AB (ref 65–99)
POTASSIUM: 4.1 mmol/L (ref 3.5–5.1)
Sodium: 135 mmol/L (ref 135–145)
TOTAL PROTEIN: 6.2 g/dL — AB (ref 6.5–8.1)

## 2016-12-08 LAB — BASIC METABOLIC PANEL
Anion gap: 8 (ref 5–15)
BUN: 27 mg/dL — AB (ref 6–20)
CALCIUM: 8 mg/dL — AB (ref 8.9–10.3)
CO2: 30 mmol/L (ref 22–32)
CREATININE: 1.2 mg/dL (ref 0.61–1.24)
Chloride: 99 mmol/L — ABNORMAL LOW (ref 101–111)
GFR calc Af Amer: >60 mL/min (ref >60)
GFR calc non Af Amer: >60 mL/min (ref >60)
GLUCOSE: 169 mg/dL — AB (ref 65–99)
Potassium: 4.4 mmol/L (ref 3.5–5.1)
Sodium: 137 mmol/L (ref 135–145)

## 2016-12-08 LAB — CBC
HCT: 25.1 % — ABNORMAL LOW (ref 39.0–52.0)
Hemoglobin: 8 g/dL — ABNORMAL LOW (ref 13.0–17.0)
MCH: 31 pg (ref 26.0–34.0)
MCHC: 31.9 g/dL (ref 30.0–36.0)
MCV: 97.3 fL (ref 78.0–100.0)
PLATELETS: 214 10*3/uL (ref 150–400)
RBC: 2.58 MIL/uL — ABNORMAL LOW (ref 4.22–5.81)
RDW: 17.3 % — AB (ref 11.5–15.5)
WBC: 5.7 10*3/uL (ref 4.0–10.5)

## 2016-12-08 LAB — BLOOD GAS, ARTERIAL
ACID-BASE EXCESS: 8.8 mmol/L — AB (ref 0.0–2.0)
Bicarbonate: 33.3 mmol/L — ABNORMAL HIGH (ref 20.0–28.0)
Drawn by: 345601
FIO2: 30
LHR: 16 {breaths}/min
O2 SAT: 94.1 %
PATIENT TEMPERATURE: 100.3
PCO2 ART: 52.5 mmHg — AB (ref 32.0–48.0)
PEEP: 5 cmH2O
PH ART: 7.424 (ref 7.350–7.450)
VT: 620 mL
pO2, Arterial: 78.3 mmHg — ABNORMAL LOW (ref 83.0–108.0)

## 2016-12-08 LAB — MAGNESIUM: MAGNESIUM: 2 mg/dL (ref 1.7–2.4)

## 2016-12-08 LAB — PHOSPHORUS: Phosphorus: 3 mg/dL (ref 2.5–4.6)

## 2016-12-08 MED ORDER — CLONAZEPAM 0.5 MG PO TBDP
1.0000 mg | ORAL_TABLET | Freq: Three times a day (TID) | ORAL | Status: DC
  Administered 2016-12-08 – 2016-12-09 (×4): 1 mg
  Filled 2016-12-08 (×4): qty 2

## 2016-12-08 MED ORDER — IBUPROFEN 100 MG/5ML PO SUSP
400.0000 mg | Freq: Four times a day (QID) | ORAL | Status: DC | PRN
  Filled 2016-12-08: qty 20

## 2016-12-08 MED ORDER — FUROSEMIDE 10 MG/ML IJ SOLN
40.0000 mg | Freq: Once | INTRAMUSCULAR | Status: AC
  Administered 2016-12-08: 40 mg via INTRAVENOUS
  Filled 2016-12-08: qty 4

## 2016-12-08 MED ORDER — QUETIAPINE FUMARATE 50 MG PO TABS
150.0000 mg | ORAL_TABLET | Freq: Two times a day (BID) | ORAL | Status: DC
  Administered 2016-12-08: 10:00:00 150 mg
  Filled 2016-12-08: qty 1

## 2016-12-08 MED ORDER — FUROSEMIDE 10 MG/ML IJ SOLN
40.0000 mg | Freq: Two times a day (BID) | INTRAMUSCULAR | Status: DC
  Administered 2016-12-09 – 2016-12-12 (×7): 40 mg via INTRAVENOUS
  Filled 2016-12-08 (×8): qty 4

## 2016-12-08 NOTE — Progress Notes (Signed)
STROKE TEAM PROGRESS NOTE   HISTORY OF PRESENT ILLNESS (per record) Lawrence Kane is an 51 y.o. male with HIV/AIDS (first diagnosed 2003, last CD4 count 245 with viremia of 1,160 in October 2017; on Genovya, Darunavir and Bactrim), Genital Warts, COPD, and cocaine substance abuse who presented to [facility] on 1/24 with complaint of altered mental status. Per notes, patient may have recently had some type of surgery in [place] where his mother resides. Afebrile, tachycardic to 155, RR 18, hypertensive to 240/112. Patient was intubated for airway protection. 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 Neurology was asked to evaluate patient for possible cardio embolic versus vasculitis. His last known well is unable to be determined. Patient was not administered IV t-PA secondary to being out of window. He was admitted for further evaluation and treatment.   SUBJECTIVE (INTERVAL HISTORY) No family at bedside. His RN at bedside. He is still intubated on sedation. Poorly responsive. CSF HSV PCR pending. ID following for meningitis. CD4 low this admission. CSF showed high WBC, RBC, and protein. However, glucose first sample was low but repeat was not low. On acyclovir and Abx.    OBJECTIVE Temp:  [98.4 F (36.9 C)-102.5 F (39.2 C)] 102.5 F (39.2 C) (01/31 0808) Pulse Rate:  [58-84] 84 (01/31 0900) Cardiac Rhythm: Normal sinus rhythm (01/31 0800) Resp:  [16-33] 33 (01/31 0900) BP: (98-129)/(43-66) 124/56 (01/31 0900) SpO2:  [95 %-100 %] 96 % (01/31 0900) FiO2 (%):  [30 %] 30 % (01/31 0808) Weight:  [106.3 kg (234 lb 5.6 oz)] 106.3 kg (234 lb 5.6 oz) (01/31 0337)  CBC:  Recent Labs Lab 12/02/16 0009  12/07/16 0540 12/08/16 0421  WBC 20.1*  < > 7.9 5.7  NEUTROABS 18.7*  --   --   --   HGB 9.0*  < > 7.5* 8.0*  HCT 28.2*  < > 23.4* 25.1*  MCV 97.2  < > 98.3 97.3  PLT 229  < > 201 214  < > =  values in this interval not displayed.  Basic Metabolic Panel:  Recent Labs Lab 12/07/16 0540 12/08/16 0421  NA 136 135  K 4.5 4.1  CL 100* 98*  CO2 29 31  GLUCOSE 242* 161*  BUN 27* 24*  CREATININE 1.05 1.03  CALCIUM 7.9* 7.9*  MG 1.9 2.0  PHOS 2.9 3.0    Lipid Panel:    Component Value Date/Time   TRIG 86 12/06/2016 0417   HgbA1c:  Lab Results  Component Value Date   HGBA1C 6.6 (H) 12/03/2016   Urine Drug Screen: No results found for: LABOPIA, COCAINSCRNUR, LABBENZ, AMPHETMU, THCU, LABBARB    IMAGING I have personally reviewed the radiological images below and agree with the radiology interpretations.  Ct Head Wo Contrast 12/02/2016 1. Negative for bleed or other acute intracranial process. 2. Mild bilateral maxillary and sphenoid sinus disease.   Mr Lodema Pilot Contrast 12/04/2016 1. 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 This pattern could be related to vasospasm from the patient's meningitis, or from central/cardiac embolic disease. 2. Sulcal signal could be meningitis debris or artifact from elevated FiO2. Negative for intracranial collection or intraventricular debris. 3. History of HSV positive CSF PCR. No typical temporal/limbic encephalitis pattern. 4. Generalized sinusitis and mastoiditis.   2-D echocardiogram - Left ventricle: The cavity size was normal. Wall thickness was normal. Systolic function was normal. The estimated ejection  fraction was in the range of 60% to 65%. Wall motion was normal; there were no regional wall motion abnormalities. Indeterminant diastolic function. - Aortic valve: There was no stenosis. - Mitral valve: There was no significant regurgitation. Valve area by pressure half-time: 2.37 cm^2. - Right ventricle: The cavity size was mildly dilated. Systolic function was normal. - Right atrium: The atrium was mildly dilated. - Tricuspid valve: Peak RV-RA gradient (S): 25 mm Hg. - Pulmonary arteries: PA peak pressure:  40 mm Hg (S). - Systemic veins: IVC measured 3.1 cm with < 50% respirophasic variation, suggesting RA pressure 15 mmHg. Impressions:   Normal LV size with EF 60-65%. Mildly dilated RV with normal systolic function. Mildly dilated RA. Mild pulmonary hypertension.  EEG This sedated EEG is abnormal due to diffuse slowing and suppression of the background. Clinical Correlation of the above findings indicates diffuse cerebral dysfunction that is non-specific in etiology but likely secondary to pharmacologic effect.  However, such findings can be seen with hypoxic/ischemic injury, toxic/metabolic encephalopathies, or neurodegenerative disorders.  If further evaluation is required, consider repeating the EEG off of sedation.   MRA head and neck - pending   PHYSICAL EXAM  Temp:  [98.4 F (36.9 C)-103.1 F (39.5 C)] 103.1 F (39.5 C) (01/31 1033) Pulse Rate:  [58-93] 93 (01/31 1000) Resp:  [16-33] 21 (01/31 1000) BP: (98-129)/(43-86) 114/86 (01/31 1000) SpO2:  [86 %-100 %] 86 % (01/31 1000) FiO2 (%):  [30 %] 30 % (01/31 0808) Weight:  [234 lb 5.6 oz (106.3 kg)] 234 lb 5.6 oz (106.3 kg) (01/31 0337)  General - Well nourished, well developed, intubated on sedation.  Ophthalmologic - Fundi not visualized.  Cardiovascular - Regular rate and rhythm.  Neuro - intubated on sedation, not responsive. On pain stimulation, pt barely open eyes, not following any commands. With eye opening, eyes upward gaze position, pupils 2.555mm, no pupillary reflex or doll's eyes, weak corneal bilaterally, positive gag. With pain, he mildly withdraw BUEs with L>R, but no movement of BLEs. DTR diminished and no babinski. Sensation, coordination and gait not tested.    ASSESSMENT/PLAN Lawrence Kane is a 51 y.o. 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 CSF positive for  HSV. Concern for cardioembolic versus vasculitic source of embolic stroke. He did not receive IV t-PA due to unknown last known well.   Meningoencephalitis  Etiology unclear  CSF elevated WBC, RBC, protein. 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 Endocarditis is in the DDx but BCx and CSF culture so far all negative. Infection related hypercoagulable state also in DDx, but less likely given prominent intracranial processes.  Resultant  Coma, intubation, sedation  CT head no acute abnormality. Mild bilateral maxillary and sphenoid sinus disease   MRI  bilateral cerebellum and cerebral cortex punctate infarcts. Sulcal signal.   MRA HEAD   pending  MRA neck pending  2D Echo  EF 60-65%  EEG diffuse slowing in suppression of background  LDL  pending  HgbA1c 6.6  Heparin 5000 units sq tid for VTE prophylaxis  Diet NPO time specified  No  antithrombotic prior to admission, now on aspirin 325 mg daily. Continue ASA.   Ongoing aggressive stroke risk factor management  Therapy recommendations:  Order when appropriate   Disposition:  pending   HIV / AIDS  Following with [facility] ID  On HAART as outpt  08/2016 CD4 245  On admission CD4 = 10  ID on board  Tobacco abuse  Current smoker  Smoking cessation counseling will be provided  Other Stroke Risk Factors  ETOH use   History of cocaine abuse  Other Active Problems  Transaminitis, HCV+, chronic Hep B, reflexive hepatitis C  Persistent eosinophilia CSF   Left lower lobe pneumonia - ESBL Klebsiella   History of personality disorder   S/p recent scrotal condyloma removal - ? HSV-2 positive  Hospital day #  7  This patient is critically ill due to meningoencephalitis, HIV, embolic stroke, coma, sepsis and at significant risk of neurological worsening, death form neuro worsening, recurrent stroke, septic shock, brain herniation, DIC and brain death. This patient's care requires constant monitoring of vital signs, hemodynamics, respiratory and cardiac monitoring, review of multiple databases, neurological assessment, discussion with family, other specialists and medical decision making of high complexity. I spent 40 minutes of neurocritical care time in the care of this patient.  Marvel Plan, MD PhD Stroke Neurology 12/08/2016 11:37 AM   To contact Stroke Continuity provider, please refer to [email or web address]. After hours, contact General Neurology

## 2016-12-08 NOTE — Progress Notes (Signed)
eLink Physician-Brief Progress Note Patient Name: Lawrence NorthDarren E Kane DOB: [birth year] MRN: [id number]   Date of Service  12/08/2016  HPI/Events of Note  Written to get Rocuronium for MRI. Nurse wants to know if it is OK to give.   eICU Interventions  Will order: 1. ABG now.      Intervention Category Major Interventions: Respiratory failure - evaluation and management  Astraea Gaughran Eugene 12/08/2016, 11:21 PM

## 2016-12-08 NOTE — Progress Notes (Addendum)
[facility]   Reason for visit: Follow up on meningitis  Interval History: remains sedated, intubated  CXR independently reviewed and about the same; Tmax 100.4; LP repeated and about the same except glucose now normalizing  Days of antibiotics 8 Meropenem day 1 7 days ceftriaxone 7 days vancomycin Day 7 acyclovir  Physical Exam: Constitutional:  Vitals:   12/08/16 0700 12/08/16 0808  BP: (!) 128/56   Pulse: 81   Resp: (!) 27 (!) 31  Temp:  (!) 102.5 F (39.2 C)  sedated Eyes: anicteric HENT: +ET Respiratory: coarse breath sounds; on vent, forceful breathing on vent Cardiovascular: RRR GI: soft, nt, nd  Review of Systems: Unable to be assessed due to mental status  Lab Results  Component Value Date   WBC 5.7 12/08/2016   HGB 8.0 (L) 12/08/2016   HCT 25.1 (L) 12/08/2016   MCV 97.3 12/08/2016   PLT 214 12/08/2016    Lab Results  Component Value Date   CREATININE 1.03 12/08/2016   BUN 24 (H) 12/08/2016   NA 135 12/08/2016   K 4.1 12/08/2016   CL 98 (L) 12/08/2016   CO2 31 12/08/2016    Lab Results  Component Value Date   ALT 342 (H) 12/08/2016   AST 320 (H) 12/08/2016   ALKPHOS 55 12/08/2016     Microbiology: Recent Results (from the past 240 hour(s))  MRSA PCR Screening     Status: Abnormal   Collection Time: 12/01/16 10:26 PM  Result Value Ref Range Status   MRSA by PCR POSITIVE (A) NEGATIVE Final    Comment:        The GeneXpert MRSA Assay (FDA approved for NASAL specimens only), is one component of a comprehensive MRSA colonization surveillance program. It is not intended to diagnose MRSA infection nor to guide or monitor treatment for MRSA infections. RESULT CALLED TO, READ BACK BY AND VERIFIED WITH: R DONAHUE,RN _0  12/02/16 MKELLY,MLT   Culture, respiratory (tracheal aspirate)     Status: None   Collection Time: 12/01/16 11:05 PM  Result Value Ref Range Status   Specimen Description TRACHEAL ASPIRATE   Final   Special Requests NONE  Final   Gram Stain   Final    FEW WBC PRESENT,BOTH PMN AND MONONUCLEAR NO ORGANISMS SEEN    Culture Consistent with normal respiratory flora.  Final   Report Status 12/04/2016 FINAL  Final  Culture, blood (routine x 2)     Status: None   Collection Time: 12/02/16 12:00 AM  Result Value Ref Range Status   Specimen Description BLOOD LEFT HAND  Final   Special Requests AEROBIC BOTTLE ONLY 10ML  Final   Culture NO GROWTH 5 DAYS  Final   Report Status 12/07/2016 FINAL  Final  Culture, blood (routine x 2)     Status: None   Collection Time: 12/02/16 12:04 AM  Result Value Ref Range Status   Specimen Description BLOOD RIGHT HAND  Final   Special Requests BOTTLES DRAWN AEROBIC AND ANAEROBIC 5ML  Final   Culture NO GROWTH 5 DAYS  Final   Report Status 12/07/2016 FINAL  Final  Acid Fast Smear (AFB)     Status: None   Collection Time: 12/02/16  9:00 AM  Result Value Ref Range Status   AFB Specimen Processing Concentration  Final   Acid Fast Smear Negative  Final    Comment: (NOTE) Performed At: [facility] [place], [place] [id number] Lindon Romp MD [id number]  Source (AFB) ACID FAST BACILLI  Corrected  CSF culture     Status: None   Collection Time: 12/02/16  2:24 PM  Result Value Ref Range Status   Specimen Description CSF  Final   Special Requests NONE  Final   Gram Stain   Final    CYTOSPIN SMEAR WBC PRESENT,BOTH PMN AND MONONUCLEAR NO ORGANISMS SEEN    Culture NO GROWTH 3 DAYS  Final   Report Status 12/06/2016 FINAL  Final  Culture, fungus without smear     Status: None (Preliminary result)   Collection Time: 12/02/16  2:25 PM  Result Value Ref Range Status   Specimen Description CSF  Final   Special Requests NONE  Final   Culture NO FUNGUS ISOLATED AFTER 3 DAYS  Final   Report Status PENDING  Incomplete  Culture, bal-quantitative     Status: Abnormal   Collection Time: 12/05/16  9:49 AM  Result Value Ref  Range Status   Specimen Description BRONCHIAL ALVEOLAR LAVAGE  Final   Special Requests NONE  Final   Gram Stain NO WBC SEEN NO ORGANISMS SEEN  Final   Culture (A)  Final    40,000 COLONIES/mL KLEBSIELLA PNEUMONIAE Confirmed Extended Spectrum Beta-Lactamase Producer (ESBL)    Report Status 12/07/2016 FINAL  Final   Organism ID, Bacteria KLEBSIELLA PNEUMONIAE (A)  Final      Susceptibility   Klebsiella pneumoniae - MIC*    AMPICILLIN >=32 RESISTANT Resistant     CEFAZOLIN >=64 RESISTANT Resistant     CEFEPIME 2 RESISTANT Resistant     CEFTAZIDIME 4 RESISTANT Resistant     CEFTRIAXONE >=64 RESISTANT Resistant     CIPROFLOXACIN 1 SENSITIVE Sensitive     GENTAMICIN >=16 RESISTANT Resistant     IMIPENEM <=0.25 SENSITIVE Sensitive     TRIMETH/SULFA >=320 RESISTANT Resistant     AMPICILLIN/SULBACTAM >=32 RESISTANT Resistant     PIP/TAZO 16 SENSITIVE Sensitive     Extended ESBL POSITIVE Resistant     * 40,000 COLONIES/mL KLEBSIELLA PNEUMONIAE  Pneumocystis smear by DFA     Status: None   Collection Time: 12/05/16  9:49 AM  Result Value Ref Range Status   Specimen Source-PJSRC BRONCHIAL ALVEOLAR LAVAGE  Final   Pneumocystis jiroveci Ag NEGATIVE  Final    Comment: Performed at [facility]  Fungus Culture With Stain     Status: None (Preliminary result)   Collection Time: 12/05/16  9:49 AM  Result Value Ref Range Status   Fungus Stain Final report  Final    Comment: (NOTE) Performed At: [facility] [place], [place] [id number] Lindon Romp MD [id number]    Fungus (Mycology) Culture PENDING  Incomplete   Fungal Source BRONCHIAL ALVEOLAR LAVAGE  Final  Fungus Culture Result     Status: None   Collection Time: 12/05/16  9:49 AM  Result Value Ref Range Status   Result 1 Comment  Final    Comment: (NOTE) KOH/Calcofluor preparation:  no fungus observed. Performed At: [facility] [place], [place] [id number] Lindon Romp MD [id number]   CSF culture     Status: None (Preliminary result)   Collection Time: 12/06/16  4:54 PM  Result Value Ref Range Status   Specimen Description CSF  Final   Special Requests NONE  Final   Gram Stain   Final    WBC PRESENT,BOTH PMN AND MONONUCLEAR NO ORGANISMS SEEN CYTOSPIN    Culture NO GROWTH <  24 HOURS  Final   Report Status PENDING  Incomplete    Impression/Plan:  1. 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  On acyclovir, meropenem for lungs.    2.  Encephalopathy - may be related to #1.   3. transaminitis - increased today to 320/342.  ? Medication effect.  His ARVs are the same. Could be seroquel, reactivation of hepatitis B, or ARVs.  I would continue his ARVs for now due to the hepatitis B.  I will check hepatitis B DNA  4.  HIV - has been controlled on salvage regimen with Prezcobix, Tivicay and Descovy.  Continue.    5.  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  Strongyloides Ab negative.   6. ESBL in BAL - CXR reviewed, no significant opacity, vent setting stable.  No indication for treatment. Can narrow back to ceftriaxone at meningitis dose for #1.   Dr. Tommy Medal to follow up tomorrow

## 2016-12-08 NOTE — Progress Notes (Signed)
PULMONARY / CRITICAL CARE MEDICINE   Name: Lawrence Kane MRN: [id number] DOB: 01/15/1966    ADMISSION DATE:  12/01/2016 CONSULTATION DATE:  12/01/2016  REFERRING MD:  [name] transfer  CHIEF COMPLAINT:  AMS  HISTORY OF PRESENT ILLNESS:   51 year old man with HIV/AIDS (first diagnosed 2003, last CD4 count 245 with viremia of 1,160 in October 2017; on Genovya, Darunavir and Bactrim), Genital Warts, COPD, and cocaine substance abuse who presented to [facility] on 1/24 with complaint of altered mental status. Per notes, patient may have recently had some type of surgery in [place] where his mother resides. Afebrile, tachycardic to 155, RR 18, hypertensive to 240/112. Patient was intubated for airway protection, CVL in IJ was placed, and LP was performed given his altered mental status. He was started on Versed and propofol for sedation. Initial labs showed leukocytosis of 13.9, hemoglobin 10.9, hct 33.6 and plts 269. BMET showed Sodium 139, K 3.9, Cl 101, Bicarb 21, BUN 23, Creatine 1.10, glucose 163. ABG showed pH of 7.28, pCO2 48, pO2 158, on FiO2 of 40%, PEEP 5, TV 450 and RR 18. Lactic Acid 3.3, 6.9. AST 125, ALT 224, and Alk Phos 88. UA negative for infection. UDS positive for cocaine. CSF appeared yellow, cloudy, xanthochromic, WBC 4845, RBC 625, glucose <20, total protein >300, Neutrophils 16%, Eos 64%, monocytes 17%, lymphocytes 3%. Gram stain performed, but unable to view results. ID was consulted. Dr. Baxter Flattery recommended continuing Truvada and Tivicay through NGT  SUBJECTIVE:  More agitation issues. Sedated this morning.  VITAL SIGNS: BP (!) 128/56   Pulse 81   Temp (!) 102.5 F (39.2 C) (Axillary) Comment: RN Alger Simons aware   Resp (!) 27   Ht _0  (1.93 m)   Wt 234 lb 5.6 oz (106.3 kg)   SpO2 96%   BMI 28.53 kg/m   VENTILATOR SETTINGS: Vent Mode: PRVC FiO2 (%):  [30 %] 30 % Set Rate:  [16 bmp] 16 bmp Vt Set:  [620 mL] 620 mL PEEP:  [5 cmH20] 5 cmH20 Plateau Pressure:   [15 cmH20-19 cmH20] 19 cmH20  INTAKE / OUTPUT: I/O last 3 completed shifts: In: 7008.9 [I.V.:2681.4; NG/GT:2872.5; IV Piggyback:1455] Out: 6060 [Urine:6060]  PHYSICAL EXAMINATION: General Apperance: NAD HEENT: Normocephalic, atraumatic, anicteric sclera Neck: Supple, trachea midline Lungs: Clear to auscultation bilaterally. No wheezes. Heart: Regular rate and rhythm, no murmur/rub/gallop Abdomen: Soft, nontender, nondistended Extremities: Warm and well perfused, no edema Skin: Condyloma on lips Neurologic: Sedated. No gross deficits.  LABS:  BMET  Recent Labs Lab 12/06/16 0417 12/07/16 0540 12/08/16 0421  NA 139 136 135  K 4.8 4.5 4.1  CL 102 100* 98*  CO2 _1 BUN 23* 27* 24*  CREATININE 0.96 1.05 1.03  GLUCOSE 261* 242* 161*    Electrolytes  Recent Labs Lab 12/06/16 0417 12/06/16 1325 12/07/16 0540 12/08/16 0421  CALCIUM 8.2*  --  7.9* 7.9*  MG 2.0 2.0 1.9 2.0  PHOS 3.3 3.3 2.9 3.0    CBC  Recent Labs Lab 12/06/16 0417 12/07/16 0540 12/08/16 0421  WBC 12.1* 7.9 5.7  HGB 8.2* 7.5* 8.0*  HCT 26.1* 23.4* 25.1*  PLT 206 201 214    Coag's  Recent Labs Lab 12/06/16 1215  INR 1.18    Sepsis Markers  Recent Labs Lab 12/02/16 0009 12/02/16 0530  LATICACIDVEN 1.3 1.3    ABG  Recent Labs Lab 12/02/16 1114  PHART 7.464*  PCO2ART 33.5  PO2ART 131.0*    Liver Enzymes  Recent  Labs Lab 12/05/16 1517 12/07/16 0540 12/08/16 0421  AST 145* 146* 320*  ALT 203* 208* 342*  ALKPHOS 44 44 55  BILITOT 0.5 0.2* 0.6  ALBUMIN 1.9* 1.6* 1.6*    Cardiac Enzymes  Recent Labs Lab 12/03/16 2152 12/04/16 0211 12/04/16 0811  TROPONINI 0.04* 0.04* <0.03    Glucose  Recent Labs Lab 12/08/16 0251 12/08/16 0405 12/08/16 0507 12/08/16 0558 12/08/16 0701 12/08/16 0806  GLUCAP 224* 139* 332* 344* 223* 201*    Imaging Dg Chest Port 1 View  Result Date: 12/08/2016 CLINICAL DATA:  Intubation. EXAM: PORTABLE CHEST 1 VIEW  COMPARISON:  12/07/2016. FINDINGS: Interim advancement of NG tube below left hemidiaphragm. Endotracheal tube, left IJ line in stable position. Cardiomegaly. Bibasilar pulmonary infiltrates. No pleural effusion or pneumothorax. IMPRESSION: 1. Interim advancement of NG tube below left hemidiaphragm. Endotracheal tube and left IJ line stable position. 2.  Bibasilar atelectasis and infiltrates again noted.  No change. Electronically Signed   By: Marcello Moores  Register   On: 12/08/2016 06:48   Dg Abd Portable 1v  Result Date: 12/07/2016 CLINICAL DATA:  Adjusted OG tube EXAM: PORTABLE ABDOMEN - 1 VIEW COMPARISON:  12/01/2016 FINDINGS: Enteric tube terminates in the proximal gastric body. Nonobstructive bowel gas pattern. Visualized osseous structures are within normal limits. IMPRESSION: Enteric tube terminates in the proximal gastric body. Electronically Signed   By: Julian Hy M.D.   On: 12/07/2016 09:20     STUDIES:  1/24 CXR >> ATX vs LLL infiltrate 1/25 CT head >> neg for acute intracranial process 1/25 LP cytology >> no malignant cells 1/25 LP >> yellow, cloudy, glucose 20, total protein >600, RBC 114>49, WBC 970, 36% neutrophil, 20% lymphs, 19% eos 1/28 BAL cell count >> 266 WBC, eos 1 1/28 BAL cytology >> no malignant cells 1/27 BAL for wet mount> neg for parasites 1/27 MRI brain >> small acute infarcts in cerebellum and cerebral cortex, sucal signal 1/29 LP >> Glucose 82, protein >600, WBC 600>885, eosinophils 28% 1/29 Echo >> LV EF 60-65%, indeterminent diastolic function, wall motion normal 1/30 EEG >> diffuse slowing and background suppression, no seizures   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 1/24 Cryptococcal [name]): negative 1/24 CSF [facility]): NGTD 1/22 scrotal wound cx [name]): MRSA Toxoplasma [name]): Negative 1/25 BCx x2 : NGTD 1/25 HCV ab + 1/24 Trach asp 1/24: NGTD 1/24 MRSA PCR: MRSA pos 1/25 HIV quant: 80, CD4  10 1/25 CSF AFB: Smear neg, >> 1/25 CSF fungal cx: NGTD 1/25 CSF crypto ag: neg 1/25 CSF gram stain no organisms, cx: NGTD 1/27 strongyloides ab >> negative 1/27 coccidiomycosis ab >> 1/28 BAL AFB smear/cx> 1/28 BAL Fungus cx >> 1/28 BAL gram stain/Cx: 40K ESBL Klebsiella pneumoniae 1/28 BAL pneumocystis smear: Neg 1/27 O&P 1/29 CSF Cx >> no organisms on gram stain, >> NGTD   ANTIBIOTICS: Rocephin 1/24>>1/30 Vancomycin 1/24>>1/30 Acyclovir 1/24>> 1/26, 1/27>> Ampicillin 1/24>> 1/27 Bactrim M/W/F for PCP prophylaxis Meropenem 1/30>>  SIGNIFICANT EVENTS: 1/24> Transferred from [facility] 1/25> Underwent LP here  LINES/TUBES: L IJ CVL 1/24 >> ETT 1/24 >> Foley 1/24 >> OGT 1/24 >>  DISCUSSION: 51 year old man with HIV/AIDS (first diagnosed 2003, last CD4 count 245 with viremia of 1,160 in October 2017; on Genovya, Darunavir and Bactrim), Genital Warts, COPD, and cocaine substance abuse who presented to [facility] on 1/24 with altered mental status. Intubated for airway protection.   ASSESSMENT / PLAN:  PULMONARY A: Acute Respiratory Failure 2/2 Underlying Sepsis and Meningitis-  Intubated for airway protection LLL PNA>> ESBL Klebsiella  P:   Continue full vent support Daily wake up assessment Antibiotics as per ID section  CARDIOVASCULAR A:  Sinus Tachycardia Incomplete RBBB HTN P:  Hydralazine prn  RENAL A:   Lactic Acidosis, resolved P:   Follow labs, urine output, creatinine Lasix 90m x 1  GASTROINTESTINAL A:   Transaminitis HCV ab+, Chronic hep b P:   Continue tube feeds Follow LFTs Famotidine 20 mg IV BID  HEMATOLOGIC A:   Mild leukocytosis > Resolved Mild anemia P:  Sub Q heparin for VTE ppx  INFECTIOUS A:   Bacterial meningitis, no organism isolated [facility]> ESBL Klebsiella  HIV Chronic Hep B Hep C antibody reflex > +, Follow viral load P:   Toxoplasma neg Continue antiretrovirals  per ID Ceftriaxone 1/24, changed to meropenem 1/30>> D/c'd Vanc, Ampicillin and steroids Acyclovir 1/24 >> Bactrim MWF ppx  ENDOCRINE A:   Hyperglycemia   P:   Insulin gtt CBGs Q1H  NEUROLOGIC A:   Metabolic Encephalopathy History of Cocaine Abuse History of Personality Disorder  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 MRA head and neck pending Has Thorazine prn for hiccups  FAMILY  - Updates: Mother, sister, brother updated via phone 1/29 - Inter-disciplinary family meet or Palliative Care meeting due by:  1/31  JeJacques EarthlyMD  Internal Medicine PGY-3 Pager: [phone] no answer or after 3pm call: [phone]/31/2018, 8:38 AM  ATTENDING NOTE / ATTESTATION NOTE :   I have discussed the case with the resident/APP  Dr. JeJacques EarthlyI agree with the resident/APP's  history, physical examination, assessment, and plans.    I have edited the above note and modified it according to our agreed history, physical examination, assessment and plan.   Briefly, pt with HIV, admitted with AMS. Intubated for airway protection. 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 S/P LP on 1/25 with elevated OP at 30 cm water.   Pt had a rpt LP on 1/29 with better OP at 19 cm water. Kleb pna in BAL which is resistant to Rocephin.  Has volume overload, gently diurescing. On and off agitation.    Vitals:  Vitals:   12/08/16 1100 12/08/16 1137 12/08/16 1200 12/08/16 1210  BP: (!) 117/56  (!) 114/55   Pulse: 88  81 83  Resp: _0 Temp:  (!) 101.8 F (38.8 C)    TempSrc:  Axillary    SpO2: 93%  94% 94%  Weight:      Height:        Constitutional/General: well-nourished, well-developed, intubated, sedated, not in any distress  Body mass index is 28.53 kg/m. Wt Readings from Last 3  Encounters:  12/08/16 106.3 kg (234 lb 5.6 oz)  11/28/16 111.1 kg (245 lb)  11/06/16 108.9 kg (240 lb)    HEENT: PERLA, anicteric sclerae. (-) Oral thrush. Intubated, ETT in place  Neck: No masses. Midline trachea. No JVD, (-) LAD. (-) bruits appreciated.  Respiratory/Chest: Grossly normal chest. (-) deformity. (-) Accessory muscle use.  Symmetric expansion. Diminished BS on both lower lung zones. (-) wheezing, rhonchi (-) egophony Crackles at bases.   Cardiovascular: Regular rate and  rhythm, heart sounds normal, no murmur or gallops,  Gr 1  peripheral edema  Gastrointestinal:  Normal bowel sounds. Soft, non-tender. No hepatosplenomegaly.  (-) masses.   Musculoskeletal:  Normal muscle tone.   Extremities: Grossly normal. (-) clubbing, cyanosis.  Gr 1  edema  Skin: (-) rash,lesions seen.   Neurological/Psychiatric : sedated, intubated. CN grossly intact. (-) lateralizing signs.   CBC Recent Labs     12/06/16  0417  12/07/16  0540  12/08/16  0421  WBC  12.1*  7.9  5.7  HGB  8.2*  7.5*  8.0*  HCT  26.1*  23.4*  25.1*  PLT  206  201  214    Coag's Recent Labs     12/06/16  1215  INR  1.18    BMET Recent Labs     12/06/16  0417  12/07/16  0540  12/08/16  0421  NA  139  136  135  K  4.8  4.5  4.1  CL  102  100*  98*  CO2  _0 BUN  23*  27*  24*  CREATININE  0.96  1.05  1.03  GLUCOSE  261*  242*  161*    Electrolytes Recent Labs     12/06/16  0417  12/06/16  1325  12/07/16  0540  12/08/16  0421  CALCIUM  8.2*   --   7.9*  7.9*  MG  2.0  2.0  1.9  2.0  PHOS  3.3  3.3  2.9  3.0    Sepsis Markers No results for input(s): PROCALCITON, O2SATVEN in the last 72 hours.  Invalid input(s): LACTICACIDVEN  ABG No results for input(s): PHART, PCO2ART, PO2ART in the last 72 hours.  Liver Enzymes Recent Labs     12/05/16  1517  12/07/16  0540  12/08/16  0421  AST  145*  146*  320*  ALT  203*  208*  342*  ALKPHOS  44  44  55  BILITOT   0.5  0.2*  0.6  ALBUMIN  1.9*  1.6*  1.6*    Cardiac Enzymes No results for input(s): TROPONINI, PROBNP in the last 72 hours.  Glucose Recent Labs     12/08/16  0701  12/08/16  0806  12/08/16  0914  12/08/16  1015  12/08/16  1104  12/08/16  1206  GLUCAP  223*  201*  236*  222*  169*  147*    Imaging Dg Chest Port 1 View  Result Date: 12/08/2016 CLINICAL DATA:  Intubation. EXAM: PORTABLE CHEST 1 VIEW COMPARISON:  12/07/2016. FINDINGS: Interim advancement of NG tube below left hemidiaphragm. Endotracheal tube, left IJ line in stable position. Cardiomegaly. Bibasilar pulmonary infiltrates. No pleural effusion or pneumothorax. IMPRESSION: 1. Interim advancement of NG tube below left hemidiaphragm. Endotracheal tube and left IJ line stable position. 2.  Bibasilar atelectasis and infiltrates again noted.  No change. Electronically Signed   By: Marcello Moores  Register   On: 12/08/2016 06:48   Dg Chest Port 1 View  Result Date: 12/07/2016 CLINICAL DATA:  Intubated, shortness of breath, respiratory failure EXAM: PORTABLE CHEST 1 VIEW COMPARISON:  12/06/2016 FINDINGS: Endotracheal tube is 5.4 cm above the carina. NG tube has retracted and is within the mid esophagus. Left jugular peripheral IV is stable in position. Stable heart size and vascularity. Mild streaky bibasilar atelectasis/airspace disease, worse on the left. No significant interval change. No large effusion or pneumothorax. Overall stable exam. IMPRESSION: Interval retraction of the NG tube which is within the mid esophagus. Persistent bibasilar atelectasis/patchy airspace process.  Basilar pneumonia not excluded. No developing effusion or pneumothorax. Electronically Signed   By: Jerilynn Mages.  Shick M.D.   On: 12/07/2016 08:01   Dg Abd Portable 1v  Result Date: 12/07/2016 CLINICAL DATA:  Adjusted OG tube EXAM: PORTABLE ABDOMEN - 1 VIEW COMPARISON:  12/01/2016 FINDINGS: Enteric tube terminates in the proximal gastric body. Nonobstructive bowel gas  pattern. Visualized osseous structures are within normal limits. IMPRESSION: Enteric tube terminates in the proximal gastric body. Electronically Signed   By: Julian Hy M.D.   On: 12/07/2016 09:20   Assessment/Plan: 1. Meningitis, bacterial and viral. CSF (+) for HSV PCR. ID following. CD4 cnt 10 from 200+ on admission. Viral load 80. Plan to switch rocephin to meropenem 2/2 ESBL Kleb pna in BAL.  Cont acyclovir.  S/P LP on 1/29 with better OP, glc. Appreciate ID help!  2. Acute respiratory failure 2/2 unable to protect airway + mild pulm edema. Cont vent support. Will increase lasix to BID. Observe K, creat.   3. Transaminitis 2/2 chronic hep B, likely hep C + possible side effects of meds.  The new medicine was seroquel so will d/c that.  Observe LFTs.   4. CVA small in cerebellum and cortex. Observe. EF 60-65%. On ASA. Appreciate neuro input.   5. Agitation. Will d/c seroquel 2/2 elevated LFTs.  Increase clonazepam to 1 mg TID.   I spent 30  minutes of Critical Care time with this patient today. This is my time spent independent of the APP or resident.    Family :  No family at bedside.    Monica Becton, MD 12/08/2016, 1:34 PM [facility] Pager [phone] After 3 pm or if no answer, call [phone]

## 2016-12-08 NOTE — Progress Notes (Signed)
MD contact to verify Rocuronium order, that was originally ordered on 1/28 to be given when in MRI, was ok to give on 2/1 at 00:00 in MRI. Dr Arsenio LoaderSommer verified order and was ok to give.

## 2016-12-09 ENCOUNTER — Inpatient Hospital Stay (HOSPITAL_COMMUNITY): Payer: Medicaid Other

## 2016-12-09 DIAGNOSIS — A523 Neurosyphilis, unspecified: Secondary | ICD-10-CM

## 2016-12-09 DIAGNOSIS — B003 Herpesviral meningitis: Secondary | ICD-10-CM

## 2016-12-09 LAB — GLUCOSE, CAPILLARY
GLUCOSE-CAPILLARY: 112 mg/dL — AB (ref 65–99)
GLUCOSE-CAPILLARY: 140 mg/dL — AB (ref 65–99)
GLUCOSE-CAPILLARY: 140 mg/dL — AB (ref 65–99)
GLUCOSE-CAPILLARY: 143 mg/dL — AB (ref 65–99)
GLUCOSE-CAPILLARY: 155 mg/dL — AB (ref 65–99)
GLUCOSE-CAPILLARY: 157 mg/dL — AB (ref 65–99)
GLUCOSE-CAPILLARY: 159 mg/dL — AB (ref 65–99)
GLUCOSE-CAPILLARY: 160 mg/dL — AB (ref 65–99)
GLUCOSE-CAPILLARY: 167 mg/dL — AB (ref 65–99)
GLUCOSE-CAPILLARY: 173 mg/dL — AB (ref 65–99)
GLUCOSE-CAPILLARY: 187 mg/dL — AB (ref 65–99)
GLUCOSE-CAPILLARY: 189 mg/dL — AB (ref 65–99)
GLUCOSE-CAPILLARY: 203 mg/dL — AB (ref 65–99)
GLUCOSE-CAPILLARY: 220 mg/dL — AB (ref 65–99)
Glucose-Capillary: 122 mg/dL — ABNORMAL HIGH (ref 65–99)
Glucose-Capillary: 134 mg/dL — ABNORMAL HIGH (ref 65–99)
Glucose-Capillary: 140 mg/dL — ABNORMAL HIGH (ref 65–99)
Glucose-Capillary: 165 mg/dL — ABNORMAL HIGH (ref 65–99)
Glucose-Capillary: 226 mg/dL — ABNORMAL HIGH (ref 65–99)
Glucose-Capillary: 218 mg/dL — ABNORMAL HIGH (ref 65–99)
Glucose-Capillary: 179 mg/dL — ABNORMAL HIGH (ref 65–99)
Glucose-Capillary: 132 mg/dL — ABNORMAL HIGH (ref 65–99)
Glucose-Capillary: 144 mg/dL — ABNORMAL HIGH (ref 65–99)

## 2016-12-09 LAB — CSF CULTURE W GRAM STAIN

## 2016-12-09 LAB — CBC
HEMATOCRIT: 25.7 % — AB (ref 39.0–52.0)
HEMOGLOBIN: 8.2 g/dL — AB (ref 13.0–17.0)
MCH: 30.9 pg (ref 26.0–34.0)
MCHC: 31.9 g/dL (ref 30.0–36.0)
MCV: 97 fL (ref 78.0–100.0)
Platelets: 228 10*3/uL (ref 150–400)
RBC: 2.65 MIL/uL — ABNORMAL LOW (ref 4.22–5.81)
RDW: 17.4 % — AB (ref 11.5–15.5)
WBC: 7.6 10*3/uL (ref 4.0–10.5)

## 2016-12-09 LAB — COMPREHENSIVE METABOLIC PANEL
ALK PHOS: 71 U/L (ref 38–126)
ALT: 333 U/L — ABNORMAL HIGH (ref 17–63)
ANION GAP: 5 (ref 5–15)
AST: 242 U/L — ABNORMAL HIGH (ref 15–41)
Albumin: 1.6 g/dL — ABNORMAL LOW (ref 3.5–5.0)
BILIRUBIN TOTAL: 1 mg/dL (ref 0.3–1.2)
BUN: 25 mg/dL — ABNORMAL HIGH (ref 6–20)
CALCIUM: 8 mg/dL — AB (ref 8.9–10.3)
CO2: 33 mmol/L — ABNORMAL HIGH (ref 22–32)
Chloride: 99 mmol/L — ABNORMAL LOW (ref 101–111)
Creatinine, Ser: 1.11 mg/dL (ref 0.61–1.24)
GFR calc Af Amer: >60 mL/min (ref >60)
GFR calc non Af Amer: >60 mL/min (ref >60)
Glucose, Bld: 126 mg/dL — ABNORMAL HIGH (ref 65–99)
POTASSIUM: 4.4 mmol/L (ref 3.5–5.1)
Sodium: 137 mmol/L (ref 135–145)
TOTAL PROTEIN: 6.6 g/dL (ref 6.5–8.1)

## 2016-12-09 LAB — CSF CULTURE: CULTURE: NO GROWTH

## 2016-12-09 LAB — HERPES SIMPLEX VIRUS(HSV) DNA BY PCR
HSV 1 DNA: NEGATIVE
HSV 2 DNA: POSITIVE — AB

## 2016-12-09 LAB — LIPID PANEL
CHOLESTEROL: 141 mg/dL (ref 0–200)
HDL: 16 mg/dL — ABNORMAL LOW (ref >40)
LDL Cholesterol: 93 mg/dL (ref 0–99)
Total CHOL/HDL Ratio: 8.8 RATIO
Triglycerides: 162 mg/dL — ABNORMAL HIGH (ref <150)
VLDL: 32 mg/dL (ref 0–40)

## 2016-12-09 LAB — TRIGLYCERIDES: Triglycerides: 155 mg/dL — ABNORMAL HIGH (ref <150)

## 2016-12-09 LAB — PHOSPHORUS: Phosphorus: 3.6 mg/dL (ref 2.5–4.6)

## 2016-12-09 LAB — MAGNESIUM: MAGNESIUM: 2.1 mg/dL (ref 1.7–2.4)

## 2016-12-09 MED ORDER — CIPROFLOXACIN IN D5W 400 MG/200ML IV SOLN
400.0000 mg | Freq: Two times a day (BID) | INTRAVENOUS | Status: DC
  Administered 2016-12-09 (×2): 400 mg via INTRAVENOUS
  Filled 2016-12-09 (×3): qty 200

## 2016-12-09 MED ORDER — GADOBENATE DIMEGLUMINE 529 MG/ML IV SOLN
20.0000 mL | Freq: Once | INTRAVENOUS | Status: AC
  Administered 2016-12-09: 20 mL via INTRAVENOUS

## 2016-12-09 MED ORDER — CLONAZEPAM 0.5 MG PO TBDP
2.0000 mg | ORAL_TABLET | Freq: Three times a day (TID) | ORAL | Status: DC
  Administered 2016-12-09 – 2016-12-14 (×12): 2 mg
  Filled 2016-12-09 (×13): qty 4

## 2016-12-09 MED ORDER — PENICILLIN G POTASSIUM 5000000 UNITS IJ SOLR
4.0000 10*6.[IU] | INTRAVENOUS | Status: DC
  Administered 2016-12-09 – 2016-12-10 (×7): 4 10*6.[IU] via INTRAVENOUS
  Filled 2016-12-09 (×9): qty 4

## 2016-12-09 NOTE — Progress Notes (Signed)
PULMONARY / CRITICAL CARE MEDICINE   Name: Lawrence Kane MRN: [id number] DOB: [birth year]    ADMISSION DATE:  12/01/2016 CONSULTATION DATE:  12/01/2016  REFERRING MD:  [name] transfer  CHIEF COMPLAINT:  AMS  HISTORY OF PRESENT ILLNESS:   51 year old man with HIV/AIDS (first diagnosed 2003, last CD4 count 245 with viremia of 1,160 in October 2017; on Genovya, Darunavir and Bactrim), Genital Warts, COPD, and cocaine substance abuse who presented to [facility] on 1/24 with complaint of altered mental status. Per notes, patient may have recently had some type of surgery in [place] where his mother resides. Afebrile, tachycardic to 155, RR 18, hypertensive to 240/112. Patient was intubated for airway protection, CVL in IJ was placed, and LP was performed given his altered mental status. He was started on Versed and propofol for sedation. Initial labs showed leukocytosis of 13.9, hemoglobin 10.9, hct 33.6 and plts 269. BMET showed Sodium 139, K 3.9, Cl 101, Bicarb 21, BUN 23, Creatine 1.10, glucose 163. ABG showed pH of 7.28, pCO2 48, pO2 158, on FiO2 of 40%, PEEP 5, TV 450 and RR 18. Lactic Acid 3.3, 6.9. AST 125, ALT 224, and Alk Phos 88. UA negative for infection. UDS positive for cocaine. CSF appeared yellow, cloudy, xanthochromic, WBC 4845, RBC 625, glucose <20, total protein >300, Neutrophils 16%, Eos 64%, monocytes 17%, lymphocytes 3%. Gram stain performed, but unable to view results. ID was consulted. Dr. Baxter Flattery recommended continuing Truvada and Tivicay through NGT  SUBJECTIVE:  Febrile, agitated on PST this am.   VITAL SIGNS: BP (!) 96/56   Pulse 69   Temp 98.8 F (37.1 C) (Oral)   Resp 13   Ht _0  (1.93 m)   Wt 105.8 kg (233 lb 4 oz)   SpO2 98%   BMI 28.39 kg/m   VENTILATOR SETTINGS: Vent Mode: PRVC FiO2 (%):  [30 %] 30 % Set Rate:  [16 bmp] 16 bmp Vt Set:  [620 mL] 620 mL PEEP:  [5 cmH20] 5 cmH20 Plateau Pressure:  [14 cmH20-18 cmH20] 15 cmH20  INTAKE /  OUTPUT: I/O last 3 completed shifts: In: 5857.4 [I.V.:1942.4; NG/GT:2810; IV Piggyback:1105] Out: 4098 [Urine:6150]  PHYSICAL EXAMINATION: General Apperance: NAD HEENT: Normocephalic, atraumatic, anicteric sclera Neck: Supple, trachea midline Lungs: good ae, crackles at bases. (-) rhochi/wheeze.  Heart: Regular rate and rhythm, no murmur/rub/gallop Abdomen: Soft, nontender, nondistended Extremities: Warm and well perfused, Gr 2 edema Skin: Condyloma on lips Neurologic: Sedated. No gross deficits.  LABS:  BMET  Recent Labs Lab 12/08/16 0421 12/08/16 1629 12/09/16 0306  NA 135 137 137  K 4.1 4.4 4.4  CL 98* 99* 99*  CO2 31 30 33*  BUN 24* 27* 25*  CREATININE 1.03 1.20 1.11  GLUCOSE 161* 169* 126*    Electrolytes  Recent Labs Lab 12/07/16 0540 12/08/16 0421 12/08/16 1629 12/09/16 0306  CALCIUM 7.9* 7.9* 8.0* 8.0*  MG 1.9 2.0  --  2.1  PHOS 2.9 3.0  --  3.6    CBC  Recent Labs Lab 12/07/16 0540 12/08/16 0421 12/09/16 0306  WBC 7.9 5.7 7.6  HGB 7.5* 8.0* 8.2*  HCT 23.4* 25.1* 25.7*  PLT 201 214 228    Coag's  Recent Labs Lab 12/06/16 1215  INR 1.18    Sepsis Markers No results for input(s): LATICACIDVEN, PROCALCITON, O2SATVEN in the last 168 hours.  ABG  Recent Labs Lab 12/08/16 2325  PHART 7.424  PCO2ART 52.5*  PO2ART 78.3*    Liver Enzymes  Recent Labs Lab  12/07/16 0540 12/08/16 0421 12/09/16 0306  AST 146* 320* 242*  ALT 208* 342* 333*  ALKPHOS 44 55 71  BILITOT 0.2* 0.6 1.0  ALBUMIN 1.6* 1.6* 1.6*    Cardiac Enzymes  Recent Labs Lab 12/03/16 2152 12/04/16 0211 12/04/16 0811  TROPONINI 0.04* 0.04* <0.03    Glucose  Recent Labs Lab 12/09/16 0900 12/09/16 0958 12/09/16 1110 12/09/16 1204 12/09/16 1325 12/09/16 1408  GLUCAP 189* 220* 226* 218* 187* 179*    Imaging Mr Jodene Nam Head Wo Contrast  Result Date: 12/09/2016 CLINICAL DATA:  Altered mental status, follow-up multiple embolic infarcts. Cerebral spinal  fluid positive for HSV. History of HIV/aids. EXAM: MRA HEAD WITHOUT CONTRAST MRA NECK WITHOUT AND WITH CONTRAST TECHNIQUE: Angiographic images of the Circle of Willis were obtained using MRA technique without intravenous contrast. Angiographic images of the neck were obtained using MRA technique without and with intravenous contrast. Carotid stenosis measurements (when applicable) are obtained utilizing NASCET criteria, using the distal internal carotid diameter as the denominator. CONTRAST:  20 cc MultiHance COMPARISON:  MRI of the head December 04, 2016 FINDINGS: MRA HEAD FINDINGS ANTERIOR CIRCULATION: Thready irregular RIGHT cervical internal carotid artery through the level of the carotid siphon. Robust LEFT internal carotid artery. Bilateral anterior cerebral arteries arise from LEFT A1-2 junction. Normal appearance of the anterior and middle cerebral arteries. No large vessel occlusion, high-grade stenosis, abnormal luminal irregularity, aneurysm. POSTERIOR CIRCULATION: LEFT vertebral artery is dominant. Basilar artery is patent, with normal flow related enhancement of the main branch vessels. Normal flow related enhancement of the posterior cerebral arteries. No large vessel occlusion, high-grade stenosis, abnormal luminal irregularity, aneurysm. ANATOMIC VARIANTS: None. MRA NECK FINDINGS- delayed phase, venous contamination. ANTERIOR CIRCULATION: The common carotid arteries are widely patent bilaterally. The carotid bifurcation is patent bilaterally and there is no hemodynamically significant carotid stenosis by NASCET criteria. Beaded appearance of the RIGHT cervical internal carotid artery to the intracranial segments. POSTERIOR CIRCULATION: Bilateral vertebral arteries are patent to the vertebrobasilar junction. No evidence for atherosclerosis or flow limiting stenosis. Bilateral posterior communicating arteries present. Source images and MIP image were reviewed. IMPRESSION: MRA HEAD: Irregular RIGHT  internal carotid artery, vessel remains patent. No emergent large vessel occlusion or severe stenosis. No intracranial vasculopathy by MRA though catheter angiography is more sensitive. MRA NECK: Limited due to delayed phase. Beaded appearance of the RIGHT cervical internal carotid artery most compatible with fibromuscular dysplasia though focal HIV vasculopathy is possible. Electronically Signed   By: Elon Alas M.D.   On: 12/09/2016 02:37   Mr Jodene Nam Neck W Contrast  Result Date: 12/09/2016 CLINICAL DATA:  Altered mental status, follow-up multiple embolic infarcts. Cerebral spinal fluid positive for HSV. History of HIV/aids. EXAM: MRA HEAD WITHOUT CONTRAST MRA NECK WITHOUT AND WITH CONTRAST TECHNIQUE: Angiographic images of the Circle of Willis were obtained using MRA technique without intravenous contrast. Angiographic images of the neck were obtained using MRA technique without and with intravenous contrast. Carotid stenosis measurements (when applicable) are obtained utilizing NASCET criteria, using the distal internal carotid diameter as the denominator. CONTRAST:  20 cc MultiHance COMPARISON:  MRI of the head December 04, 2016 FINDINGS: MRA HEAD FINDINGS ANTERIOR CIRCULATION: Thready irregular RIGHT cervical internal carotid artery through the level of the carotid siphon. Robust LEFT internal carotid artery. Bilateral anterior cerebral arteries arise from LEFT A1-2 junction. Normal appearance of the anterior and middle cerebral arteries. No large vessel occlusion, high-grade stenosis, abnormal luminal irregularity, aneurysm. POSTERIOR CIRCULATION: LEFT vertebral artery is dominant. Basilar artery  is patent, with normal flow related enhancement of the main branch vessels. Normal flow related enhancement of the posterior cerebral arteries. No large vessel occlusion, high-grade stenosis, abnormal luminal irregularity, aneurysm. ANATOMIC VARIANTS: None. MRA NECK FINDINGS- delayed phase, venous  contamination. ANTERIOR CIRCULATION: The common carotid arteries are widely patent bilaterally. The carotid bifurcation is patent bilaterally and there is no hemodynamically significant carotid stenosis by NASCET criteria. Beaded appearance of the RIGHT cervical internal carotid artery to the intracranial segments. POSTERIOR CIRCULATION: Bilateral vertebral arteries are patent to the vertebrobasilar junction. No evidence for atherosclerosis or flow limiting stenosis. Bilateral posterior communicating arteries present. Source images and MIP image were reviewed. IMPRESSION: MRA HEAD: Irregular RIGHT internal carotid artery, vessel remains patent. No emergent large vessel occlusion or severe stenosis. No intracranial vasculopathy by MRA though catheter angiography is more sensitive. MRA NECK: Limited due to delayed phase. Beaded appearance of the RIGHT cervical internal carotid artery most compatible with fibromuscular dysplasia though focal HIV vasculopathy is possible. Electronically Signed   By: Elon Alas M.D.   On: 12/09/2016 02:37   Dg Chest Port 1 View  Result Date: 12/09/2016 CLINICAL DATA:  Intubation.  Shortness of breath. EXAM: PORTABLE CHEST 1 VIEW COMPARISON:  12/08/2016. FINDINGS: Endotracheal tube and NG tube in stable position. Stable cardiomegaly. Bilateral pulmonary infiltrates/edema are again noted, slightly progressed from prior exam. Small left pleural effusion noted on today's exam. No pneumothorax . IMPRESSION: 1. Lines and tubes in stable position. 2. Stable cardiomegaly. 3. Bilateral pulmonary infiltrates/edema, slightly progressed from prior exam. Small left pleural effusion noted on today's exam . Electronically Signed   By: Marcello Moores  Register   On: 12/09/2016 07:18     STUDIES:  1/24 CXR >> ATX vs LLL infiltrate 1/25 CT head >> neg for acute intracranial process 1/25 LP cytology >> no malignant cells 1/25 LP >> yellow, cloudy, glucose 20, total protein >600, RBC 114>49, WBC  970, 36% neutrophil, 20% lymphs, 19% eos 1/28 BAL cell count >> 266 WBC, eos 1 1/28 BAL cytology >> no malignant cells 1/27 BAL for wet mount> neg for parasites 1/27 MRI brain >> small acute infarcts in cerebellum and cerebral cortex, sucal signal 1/29 LP >> Glucose 82, protein >600, WBC 600>885, eosinophils 28% 1/29 Echo >> LV EF 60-65%, indeterminent diastolic function, wall motion normal 1/30 EEG >> diffuse slowing and background suppression, no seizures   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 1/24 Cryptococcal [name]): negative 1/24 CSF [facility]): NGTD 1/22 scrotal wound cx [name]): MRSA Toxoplasma [name]): Negative 1/25 BCx x2 : NGTD 1/25 HCV ab + 1/24 Trach asp 1/24: NGTD 1/24 MRSA PCR: MRSA pos 1/25 HIV quant: 80, CD4 10 1/25 CSF AFB: Smear neg, >> 1/25 CSF fungal cx: NGTD 1/25 CSF crypto ag: neg 1/25 CSF gram stain no organisms, cx: NGTD 1/27 strongyloides ab >> negative 1/27 coccidiomycosis ab >> 1/28 BAL AFB smear/cx> 1/28 BAL Fungus cx >> 1/28 BAL gram stain/Cx: 40K ESBL Klebsiella pneumoniae 1/28 BAL pneumocystis smear: Neg 1/27 O&P 1/29 CSF Cx >> no organisms on gram stain, >> NGTD   ANTIBIOTICS: Rocephin 1/24>>1/30 Vancomycin 1/24>>1/30 Acyclovir 1/24>> 1/26, 1/27>> Ampicillin 1/24>> 1/27 Bactrim M/W/F for PCP prophylaxis Meropenem 1/30>> 2/1 Pen G 2/1 >> Cipro 2/1 >>   SIGNIFICANT EVENTS: 1/24> Transferred from [facility] 1/25> Underwent LP here  LINES/TUBES: L IJ CVL 1/24 >> ETT 1/24 >> Foley 1/24 >> OGT 1/24 >>  DISCUSSION: 51 year old man with HIV/AIDS (first diagnosed 2003, last CD4  count 245 with viremia of 1,160 in October 2017; on Genovya, Darunavir and Bactrim), Genital Warts, COPD, and cocaine substance abuse who presented to [facility] on 1/24 with altered mental status. Intubated for airway protection.   ASSESSMENT / PLAN:  PULMONARY A: Acute Hypoxemic Respiratory Failure  2/2 HCAP ESBL Klebsiella LLL+ sepsis + Unable to protect airway + pulm edema P:   Continue full vent support. PST as tolerated.  Evans pt.  Mental status and agitation are barriers to extubation as well.  Daily wake up assessment Antibiotics as per ID section  CARDIOVASCULAR A:  Sinus Tachycardia Incomplete RBBB HTN P:  Hydralazine prn  RENAL A:   Pulm edema P:   Follow labs, urine output, creatinine Lasix 63m BID >> observe renal fxn with diuresis and acyclovir.   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 Abx switched to Pen G and Cipro on 2/1. Cipro to cover for ESBL Kleb pna Still on acyclovir.  Will ask ID if we can d/c acyclovir.  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  RASS goal: 0 to -1 ASA daily Neuro following. MRA head and neck U/R Has Thorazine prn for hiccups  FAMILY  - Updates: Mother, sister, brother updated via phone 1/29.  Mother updated by Dr. DCorrie Dandyon 1/31.  - Inter-disciplinary family meet or Palliative Care meeting due by:  1/31    I spent  30   minutes of Critical Care time with this patient today.    JMonica Becton MD 12/09/2016, 4:33 PM  Pulmonary and Critical Care Pager [phone] After 3 pm or if no answer, call [phone]

## 2016-12-09 NOTE — Progress Notes (Signed)
Pharmacy Antibiotic Note  Lawrence Kane is a 51 y.o. 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  Pharmacy has been consulted for ciprofloxacin dosing and patient was started on IV penicillin.  Plan: - Ciprofloxacin 400 mg IV q12h - Acyclovir 1000 mg IV q8h  - Penicillin G 4 million units q4 hours per MD - Monitor RPR, VDRL, cultures, renal function and clinical progression  Height: _0  (193 cm) Weight: 233 lb 4 oz (105.8 kg) IBW/kg (Calculated) : 86.8  Temp (24hrs), Avg:100.6 F (38.1 C), Min:98.9 F (37.2 C), Max:103.1 F (39.5 C)   Recent Labs Lab 12/04/16 1300 12/05/16 0431 12/06/16 0417 12/07/16 0540 12/08/16 0421 12/08/16 1629 12/09/16 0306  WBC  --  10.2 12.1* 7.9 5.7  --  7.6  CREATININE  --  1.00 0.96 1.05 1.03 1.20 1.11  VANCOTROUGH 12*  --   --  19  --   --   --     Estimated Creatinine Clearance: 105.1 mL/min (by C-G formula based on SCr of 1.11 mg/dL).    Allergies  Allergen Reactions  . Iodine   . Ketorolac     unknown  . Tylenol [Acetaminophen]     unknown    Antimicrobials this admission: Ceftriaxone 1/24 (started at OSH) >> 1/30 Vancomycin 1/24 (started at OSH) >> 1/30 Ampicillin 1/25 >>1/27 Acyclovir 1/25 >>1/26; 1/27>> Bactrim 1/25 (PCP prophylaxis) >> 1/29 Meropenem 1/30 >> 2/1 Ciprofloxacin 2/1 >> PCN 2/1 >>  Dose adjustments this admission: VT - 12 1/27 on 1 g q8h VT - 19 1/30 on 1256m q8h: no changes  Microbiology results: 1/22 scrotal wound MRSA 1/24 BCx x 2 ([name]): 1/24 HSV PCR ([name]: HSV2 POS 1/24 Cryptococcal ([name]: 1/24 CSF ([facility]: gram stain no organism  1/24 Trach asp: ngtd 1/24 MRSA PCR: positive 1/25 BCx x2: ngtd 1/25 cryptococcal: negative 1/25 CSF culture (post abx): ngtd 1/25 CSF fungal Cx: neg 1/25 CSF AFB: neg 1/25 Toxoplasma: neg 1/27 stronglyoids ab: negative 1/28 fungus from bronch: none  observed 1/28 AFB bronch: neg 1/28 PCP bronch: neg 1/28 BAL: 40,000 klebsiella pneumonia ESBL - no need to treat per ID. no parasites 1/29 CSF: ngtd 1/31 BCx: ng<12h  Thank you for allowing pharmacy to be a part of this patient's care.  TDimitri Ped PharmD, BCPS PGY-2 Infectious Diseases Pharmacy Resident Pager: [phone]/11/2016 9:21 AM

## 2016-12-09 NOTE — Progress Notes (Addendum)
Subjective:  Pt intubated  Antibiotics:  Anti-infectives    Start     Dose/Rate Route Frequency Ordered Stop   12/09/16 0900  penicillin G potassium 4 Million Units in dextrose 5 % 250 mL IVPB     4 Million Units 250 mL/hr over 60 Minutes Intravenous Every 4 hours 12/09/16 0851     12/07/16 2245  meropenem (MERREM) 2 g in sodium chloride 0.9 % 100 mL IVPB  Status:  Discontinued     2 g 200 mL/hr over 30 Minutes Intravenous Every 8 hours 12/07/16 2236 12/09/16 0851   12/04/16 2200  vancomycin (VANCOCIN) 1,250 mg in sodium chloride 0.9 % 250 mL IVPB  Status:  Discontinued     1,250 mg 166.7 mL/hr over 90 Minutes Intravenous Every 8 hours 12/04/16 1421 12/07/16 1057   12/04/16 1000  acyclovir (ZOVIRAX) 1,000 mg in dextrose 5 % 150 mL IVPB     1,000 mg 170 mL/hr over 60 Minutes Intravenous Every 8 hours 12/04/16 0943     12/03/16 0900  acyclovir (ZOVIRAX) 1,080 mg in dextrose 5 % 250 mL IVPB  Status:  Discontinued     1,080 mg 271.6 mL/hr over 60 Minutes Intravenous Every 8 hours 12/03/16 0651 12/03/16 0843   12/03/16 0900  acyclovir (ZOVIRAX) 1,000 mg in dextrose 5 % 150 mL IVPB  Status:  Discontinued     1,000 mg 170 mL/hr over 60 Minutes Intravenous Every 8 hours 12/03/16 0843 12/03/16 1002   12/02/16 1015  darunavir-cobicistat (PREZCOBIX) 800-150 MG per tablet 1 tablet     1 tablet Oral Daily with breakfast 12/02/16 1003     12/02/16 1000  emtricitabine-tenofovir AF (DESCOVY) 200-25 MG per tablet 1 tablet     1 tablet Per Tube Daily 12/01/16 2320     12/02/16 1000  dolutegravir (TIVICAY) tablet 50 mg     50 mg Oral Daily 12/01/16 2320     12/02/16 1000  vancomycin (VANCOCIN) 1,250 mg in sodium chloride 0.9 % 250 mL IVPB  Status:  Discontinued     1,250 mg 166.7 mL/hr over 90 Minutes Intravenous Every 24 hours 12/02/16 0010 12/02/16 0634   12/02/16 0900  acyclovir (ZOVIRAX) 930 mg in dextrose 5 % 150 mL IVPB  Status:  Discontinued     10 mg/kg  93 kg 168.6 mL/hr over  60 Minutes Intravenous Every 8 hours 12/02/16 0632 12/03/16 0651   12/02/16 0700  vancomycin (VANCOCIN) IVPB 1000 mg/200 mL premix  Status:  Discontinued     1,000 mg 200 mL/hr over 60 Minutes Intravenous Every 8 hours 12/02/16 0634 12/04/16 1421   12/02/16 0400  cefTRIAXone (ROCEPHIN) 2 g in dextrose 5 % 50 mL IVPB  Status:  Discontinued     2 g 100 mL/hr over 30 Minutes Intravenous Every 12 hours 12/02/16 0010 12/07/16 2222   12/02/16 0100  acyclovir (ZOVIRAX) 1,110 mg in dextrose 5 % 250 mL IVPB  Status:  Discontinued     10 mg/kg  111.1 kg 272.2 mL/hr over 60 Minutes Intravenous Every 8 hours 12/02/16 0010 12/02/16 0632   12/02/16 0100  sulfamethoxazole-trimethoprim (BACTRIM,SEPTRA) 200-40 MG/5ML suspension 20 mL  Status:  Discontinued     20 mL Per Tube Once per day on Mon Wed Fri 12/02/16 0010 12/06/16 1416   12/02/16 0000  ampicillin (OMNIPEN) 2 g in sodium chloride 0.9 % 50 mL IVPB  Status:  Discontinued     2 g 150 mL/hr over 20 Minutes  Intravenous Every 4 hours 12/01/16 2322 12/04/16 0941      Medications: Scheduled Meds: . acyclovir  1,000 mg Intravenous Q8H  . aspirin  325 mg Per Tube Daily  . chlorhexidine gluconate (MEDLINE KIT)  15 mL Mouth Rinse BID  . Chlorhexidine Gluconate Cloth  6 each Topical Q0600  . clonazepam  1 mg Per Tube TID  . darunavir-cobicistat  1 tablet Oral Q breakfast  . dolutegravir  50 mg Oral Daily  . emtricitabine-tenofovir AF  1 tablet Per Tube Daily  . famotidine  20 mg Per Tube BID  . fentaNYL (SUBLIMAZE) injection  50 mcg Intravenous Once  . furosemide  40 mg Intravenous Q12H  . heparin  5,000 Units Subcutaneous Q8H  . mouth rinse  15 mL Mouth Rinse 10 times per day  . mupirocin ointment  1 application Nasal BID  . pencillin G potassium IV  4 Million Units Intravenous Q4H   Continuous Infusions: . feeding supplement (VITAL AF 1.2 CAL) 1,000 mL (12/09/16 0618)  . fentaNYL infusion INTRAVENOUS 150 mcg/hr (12/09/16 0601)  . insulin  (NOVOLIN-R) infusion 4.7 Units/hr (12/09/16 0747)  . propofol (DIPRIVAN) infusion 15 mcg/kg/min (12/09/16 0810)   PRN Meds:.sodium chloride, acetaminophen, chlorproMAZINE (THORAZINE) IV, docusate, fentaNYL, hydrALAZINE, ibuprofen, midazolam, sennosides, sodium chloride flush    Objective: Weight change: -1 lb 1.6 oz (-0.5 kg)  Intake/Output Summary (Last 24 hours) at 12/09/16 0858 Last data filed at 12/09/16 0800  Gross per 24 hour  Intake           3687.9 ml  Output             4750 ml  Net          -1062.1 ml   Blood pressure (!) 152/88, pulse 76, temperature 100.2 F (37.9 C), temperature source Oral, resp. rate 19, height _0  (1.93 m), weight 233 lb 4 oz (105.8 kg), SpO2 100 %. Temp:  [98.9 F (37.2 C)-103.1 F (39.5 C)] 100.2 F (37.9 C) (02/01 0818) Pulse Rate:  [69-93] 76 (02/01 0808) Resp:  [13-33] 19 (02/01 0808) BP: (103-152)/(51-88) 152/88 (02/01 0808) SpO2:  [86 %-100 %] 100 % (02/01 0808) FiO2 (%):  [30 %] 30 % (02/01 0808) Weight:  [233 lb 4 oz (105.8 kg)] 233 lb 4 oz (105.8 kg) (02/01 7517)  Physical Exam: General: Alert and awake,restrained but nodding head yes and no to questions HEENT: anicteric sclera, pupils reactive to light and accommodation, EOMI CVS tachy  rate, normal r,  no murmur rubs or gallops Chest: rhonchi Abdomen: soft nontender, nondistended, normal bowel sounds, Extremities: no  clubbing or edema noted bilaterally Skin/GU: genital warts and mx areas of hypopigmentation  12/09/16:        Neuro: cannot move feet, legs at all  CBC:  CBC Latest Ref Rng & Units 12/09/2016 12/08/2016 12/07/2016  WBC 4.0 - 10.5 K/uL 7.6 5.7 7.9  Hemoglobin 13.0 - 17.0 g/dL 8.2(L) 8.0(L) 7.5(L)  Hematocrit 39.0 - 52.0 % 25.7(L) 25.1(L) 23.4(L)  Platelets 150 - 400 K/uL 228 214 201      BMET  Recent Labs  12/08/16 1629 12/09/16 0306  NA 137 137  K 4.4 4.4  CL 99* 99*  CO2 30 33*  GLUCOSE 169* 126*  BUN 27* 25*  CREATININE 1.20 1.11  CALCIUM  8.0* 8.0*     Liver Panel   Recent Labs  12/08/16 0421 12/09/16 0306  PROT 6.2* 6.6  ALBUMIN 1.6* 1.6*  AST 320* 242*  ALT 342* 333*  ALKPHOS 55 71  BILITOT 0.6 1.0       Sedimentation Rate No results for input(s): ESRSEDRATE in the last 72 hours. C-Reactive Protein No results for input(s): CRP in the last 72 hours.  Micro Results: Recent Results (from the past 720 hour(s))  MRSA PCR Screening     Status: Abnormal   Collection Time: 12/01/16 10:26 PM  Result Value Ref Range Status   MRSA by PCR POSITIVE (A) NEGATIVE Final    Comment:        The GeneXpert MRSA Assay (FDA approved for NASAL specimens only), is one component of a comprehensive MRSA colonization surveillance program. It is not intended to diagnose MRSA infection nor to guide or monitor treatment for MRSA infections. RESULT CALLED TO, READ BACK BY AND VERIFIED WITH: R DONAHUE,RN _0  12/02/16 MKELLY,MLT   Culture, respiratory (tracheal aspirate)     Status: None   Collection Time: 12/01/16 11:05 PM  Result Value Ref Range Status   Specimen Description TRACHEAL ASPIRATE  Final   Special Requests NONE  Final   Gram Stain   Final    FEW WBC PRESENT,BOTH PMN AND MONONUCLEAR NO ORGANISMS SEEN    Culture Consistent with normal respiratory flora.  Final   Report Status 12/04/2016 FINAL  Final  Culture, blood (routine x 2)     Status: None   Collection Time: 12/02/16 12:00 AM  Result Value Ref Range Status   Specimen Description BLOOD LEFT HAND  Final   Special Requests AEROBIC BOTTLE ONLY 10ML  Final   Culture NO GROWTH 5 DAYS  Final   Report Status 12/07/2016 FINAL  Final  Culture, blood (routine x 2)     Status: None   Collection Time: 12/02/16 12:04 AM  Result Value Ref Range Status   Specimen Description BLOOD RIGHT HAND  Final   Special Requests BOTTLES DRAWN AEROBIC AND ANAEROBIC 5ML  Final   Culture NO GROWTH 5 DAYS  Final   Report Status 12/07/2016 FINAL  Final  Acid Fast Smear  (AFB)     Status: None   Collection Time: 12/02/16  9:00 AM  Result Value Ref Range Status   AFB Specimen Processing Concentration  Final   Acid Fast Smear Negative  Final    Comment: (NOTE) Performed At: [facility] [place], [place] [id number] Lindon Romp MD [id number]    Source (AFB) ACID FAST BACILLI  Corrected  CSF culture     Status: None   Collection Time: 12/02/16  2:24 PM  Result Value Ref Range Status   Specimen Description CSF  Final   Special Requests NONE  Final   Gram Stain   Final    CYTOSPIN SMEAR WBC PRESENT,BOTH PMN AND MONONUCLEAR NO ORGANISMS SEEN    Culture NO GROWTH 3 DAYS  Final   Report Status 12/06/2016 FINAL  Final  Culture, fungus without smear     Status: None (Preliminary result)   Collection Time: 12/02/16  2:25 PM  Result Value Ref Range Status   Specimen Description CSF  Final   Special Requests NONE  Final   Culture NO FUNGUS ISOLATED AFTER 7 DAYS  Final   Report Status PENDING  Incomplete  Culture, bal-quantitative     Status: Abnormal   Collection Time: 12/05/16  9:49 AM  Result Value Ref Range Status   Specimen Description BRONCHIAL ALVEOLAR LAVAGE  Final   Special Requests NONE  Final   Gram Stain NO WBC SEEN NO ORGANISMS SEEN  Final   Culture (A)  Final    40,000 COLONIES/mL KLEBSIELLA PNEUMONIAE Confirmed Extended Spectrum Beta-Lactamase Producer (ESBL)    Report Status 12/07/2016 FINAL  Final   Organism ID, Bacteria KLEBSIELLA PNEUMONIAE (A)  Final      Susceptibility   Klebsiella pneumoniae - MIC*    AMPICILLIN >=32 RESISTANT Resistant     CEFAZOLIN >=64 RESISTANT Resistant     CEFEPIME 2 RESISTANT Resistant     CEFTAZIDIME 4 RESISTANT Resistant     CEFTRIAXONE >=64 RESISTANT Resistant     CIPROFLOXACIN 1 SENSITIVE Sensitive     GENTAMICIN >=16 RESISTANT Resistant     IMIPENEM <=0.25 SENSITIVE Sensitive     TRIMETH/SULFA >=320 RESISTANT Resistant     AMPICILLIN/SULBACTAM >=32 RESISTANT  Resistant     PIP/TAZO 16 SENSITIVE Sensitive     Extended ESBL POSITIVE Resistant     * 40,000 COLONIES/mL KLEBSIELLA PNEUMONIAE  Pneumocystis smear by DFA     Status: None   Collection Time: 12/05/16  9:49 AM  Result Value Ref Range Status   Specimen Source-PJSRC BRONCHIAL ALVEOLAR LAVAGE  Final   Pneumocystis jiroveci Ag NEGATIVE  Final    Comment: Performed at [facility]  Fungus Culture With Stain     Status: None (Preliminary result)   Collection Time: 12/05/16  9:49 AM  Result Value Ref Range Status   Fungus Stain Final report  Final    Comment: (NOTE) Performed At: [facility] [place], [place] [id number] Lindon Romp MD [id number]    Fungus (Mycology) Culture PENDING  Incomplete   Fungal Source BRONCHIAL ALVEOLAR LAVAGE  Final  Fungus Culture Result     Status: None   Collection Time: 12/05/16  9:49 AM  Result Value Ref Range Status   Result 1 Comment  Final    Comment: (NOTE) KOH/Calcofluor preparation:  no fungus observed. Performed At: [facility] [place], [place] [id number] Lindon Romp MD [id number]   CSF culture     Status: None (Preliminary result)   Collection Time: 12/06/16  4:54 PM  Result Value Ref Range Status   Specimen Description CSF  Final   Special Requests NONE  Final   Gram Stain   Final    WBC PRESENT,BOTH PMN AND MONONUCLEAR NO ORGANISMS SEEN CYTOSPIN    Culture NO GROWTH 2 DAYS  Final   Report Status PENDING  Incomplete  Culture, blood (routine x 2)     Status: None (Preliminary result)   Collection Time: 12/08/16  9:37 AM  Result Value Ref Range Status   Specimen Description BLOOD RIGHT ANTECUBITAL  Final   Special Requests BOTTLES DRAWN AEROBIC AND ANAEROBIC 5CC  Final   Culture NO GROWTH < 12 HOURS  Final   Report Status PENDING  Incomplete  Culture, blood (routine x 2)     Status: None (Preliminary result)   Collection Time: 12/08/16  9:41 AM  Result  Value Ref Range Status   Specimen Description BLOOD BLOOD RIGHT ARM  Final   Special Requests BOTTLES DRAWN AEROBIC AND ANAEROBIC 5CC  Final   Culture NO GROWTH < 12 HOURS  Final   Report Status PENDING  Incomplete    Studies/Results: Mr Virgel Paling KM Contrast  Result Date: 12/09/2016 CLINICAL DATA:  Altered mental status, follow-up multiple embolic infarcts. Cerebral spinal fluid positive for HSV. History of HIV/aids. EXAM: MRA HEAD WITHOUT CONTRAST MRA NECK WITHOUT AND WITH CONTRAST TECHNIQUE: Angiographic images of the  Circle of Willis were obtained using MRA technique without intravenous contrast. Angiographic images of the neck were obtained using MRA technique without and with intravenous contrast. Carotid stenosis measurements (when applicable) are obtained utilizing NASCET criteria, using the distal internal carotid diameter as the denominator. CONTRAST:  20 cc MultiHance COMPARISON:  MRI of the head December 04, 2016 FINDINGS: MRA HEAD FINDINGS ANTERIOR CIRCULATION: Thready irregular RIGHT cervical internal carotid artery through the level of the carotid siphon. Robust LEFT internal carotid artery. Bilateral anterior cerebral arteries arise from LEFT A1-2 junction. Normal appearance of the anterior and middle cerebral arteries. No large vessel occlusion, high-grade stenosis, abnormal luminal irregularity, aneurysm. POSTERIOR CIRCULATION: LEFT vertebral artery is dominant. Basilar artery is patent, with normal flow related enhancement of the main branch vessels. Normal flow related enhancement of the posterior cerebral arteries. No large vessel occlusion, high-grade stenosis, abnormal luminal irregularity, aneurysm. ANATOMIC VARIANTS: None. MRA NECK FINDINGS- delayed phase, venous contamination. ANTERIOR CIRCULATION: The common carotid arteries are widely patent bilaterally. The carotid bifurcation is patent bilaterally and there is no hemodynamically significant carotid stenosis by NASCET criteria.  Beaded appearance of the RIGHT cervical internal carotid artery to the intracranial segments. POSTERIOR CIRCULATION: Bilateral vertebral arteries are patent to the vertebrobasilar junction. No evidence for atherosclerosis or flow limiting stenosis. Bilateral posterior communicating arteries present. Source images and MIP image were reviewed. IMPRESSION: MRA HEAD: Irregular RIGHT internal carotid artery, vessel remains patent. No emergent large vessel occlusion or severe stenosis. No intracranial vasculopathy by MRA though catheter angiography is more sensitive. MRA NECK: Limited due to delayed phase. Beaded appearance of the RIGHT cervical internal carotid artery most compatible with fibromuscular dysplasia though focal HIV vasculopathy is possible. Electronically Signed   By: Elon Alas M.D.   On: 12/09/2016 02:37   Mr Jodene Nam Neck W Contrast  Result Date: 12/09/2016 CLINICAL DATA:  Altered mental status, follow-up multiple embolic infarcts. Cerebral spinal fluid positive for HSV. History of HIV/aids. EXAM: MRA HEAD WITHOUT CONTRAST MRA NECK WITHOUT AND WITH CONTRAST TECHNIQUE: Angiographic images of the Circle of Willis were obtained using MRA technique without intravenous contrast. Angiographic images of the neck were obtained using MRA technique without and with intravenous contrast. Carotid stenosis measurements (when applicable) are obtained utilizing NASCET criteria, using the distal internal carotid diameter as the denominator. CONTRAST:  20 cc MultiHance COMPARISON:  MRI of the head December 04, 2016 FINDINGS: MRA HEAD FINDINGS ANTERIOR CIRCULATION: Thready irregular RIGHT cervical internal carotid artery through the level of the carotid siphon. Robust LEFT internal carotid artery. Bilateral anterior cerebral arteries arise from LEFT A1-2 junction. Normal appearance of the anterior and middle cerebral arteries. No large vessel occlusion, high-grade stenosis, abnormal luminal irregularity, aneurysm.  POSTERIOR CIRCULATION: LEFT vertebral artery is dominant. Basilar artery is patent, with normal flow related enhancement of the main branch vessels. Normal flow related enhancement of the posterior cerebral arteries. No large vessel occlusion, high-grade stenosis, abnormal luminal irregularity, aneurysm. ANATOMIC VARIANTS: None. MRA NECK FINDINGS- delayed phase, venous contamination. ANTERIOR CIRCULATION: The common carotid arteries are widely patent bilaterally. The carotid bifurcation is patent bilaterally and there is no hemodynamically significant carotid stenosis by NASCET criteria. Beaded appearance of the RIGHT cervical internal carotid artery to the intracranial segments. POSTERIOR CIRCULATION: Bilateral vertebral arteries are patent to the vertebrobasilar junction. No evidence for atherosclerosis or flow limiting stenosis. Bilateral posterior communicating arteries present. Source images and MIP image were reviewed. IMPRESSION: MRA HEAD: Irregular RIGHT internal carotid artery, vessel remains patent. No emergent large vessel  occlusion or severe stenosis. No intracranial vasculopathy by MRA though catheter angiography is more sensitive. MRA NECK: Limited due to delayed phase. Beaded appearance of the RIGHT cervical internal carotid artery most compatible with fibromuscular dysplasia though focal HIV vasculopathy is possible. Electronically Signed   By: Elon Alas M.D.   On: 12/09/2016 02:37   Dg Chest Port 1 View  Result Date: 12/09/2016 CLINICAL DATA:  Intubation.  Shortness of breath. EXAM: PORTABLE CHEST 1 VIEW COMPARISON:  12/08/2016. FINDINGS: Endotracheal tube and NG tube in stable position. Stable cardiomegaly. Bilateral pulmonary infiltrates/edema are again noted, slightly progressed from prior exam. Small left pleural effusion noted on today's exam. No pneumothorax . IMPRESSION: 1. Lines and tubes in stable position. 2. Stable cardiomegaly. 3. Bilateral pulmonary infiltrates/edema,  slightly progressed from prior exam. Small left pleural effusion noted on today's exam . Electronically Signed   By: Marcello Moores  Register   On: 12/09/2016 07:18   Dg Chest Port 1 View  Result Date: 12/08/2016 CLINICAL DATA:  Intubation. EXAM: PORTABLE CHEST 1 VIEW COMPARISON:  12/07/2016. FINDINGS: Interim advancement of NG tube below left hemidiaphragm. Endotracheal tube, left IJ line in stable position. Cardiomegaly. Bibasilar pulmonary infiltrates. No pleural effusion or pneumothorax. IMPRESSION: 1. Interim advancement of NG tube below left hemidiaphragm. Endotracheal tube and left IJ line stable position. 2.  Bibasilar atelectasis and infiltrates again noted.  No change. Electronically Signed   By: Marcello Moores  Register   On: 12/08/2016 06:48   Dg Abd Portable 1v  Result Date: 12/07/2016 CLINICAL DATA:  Adjusted OG tube EXAM: PORTABLE ABDOMEN - 1 VIEW COMPARISON:  12/01/2016 FINDINGS: Enteric tube terminates in the proximal gastric body. Nonobstructive bowel gas pattern. Visualized osseous structures are within normal limits. IMPRESSION: Enteric tube terminates in the proximal gastric body. Electronically Signed   By: Julian Hy M.D.   On: 12/07/2016 09:20      Assessment/Plan:  INTERVAL HISTORY: temp to 103 last night   Active Problems:   COPD (chronic obstructive pulmonary disease) (HCC)   Substance abuse   Genital warts   HIV (human immunodeficiency virus infection) ([place])   AIDS ([place])   Noncompliance   Bacterial meningitis   Hypertensive urgency   Septic shock (HCC)   Transaminitis   Cocaine abuse   Open wound   Acute respiratory failure (HCC)   Altered mental status   Acute encephalopathy   Cerebral embolism with cerebral infarction   Endotracheally intubated   Meningoencephalitis    Lawrence Kane is a 51 y.o. male with  HIV/AIDS (though prior to acute illness his CD4 was >200 and his virus remains well controlled) with meningoencephalitis, evidence of infarcts on MRI,  lower extremity paralysis. 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  Neurosyphilis would be unifying diagnosis for his presentation with meningoencephalitis, infarcts, paralysis and scrotal lesions  I am adding RPR to blood VDRL to CSF as well as FTA Ab  HSV 2 typically causes a meningitis but I have not seen it cause multiple infarcts, paralysis and it typically does not cause encephalitis (HSV1 does)  Will continue to treat with acyclovir but I doubt HSV Is driving his pathology  Fungal meningitis from dimorphic fungus such as histo, blasto possible (not sure if he has been [place])  He denies IVDU but if using he would be at risk for meningitis from molds including  aspergillus  I am also adding histoplasma ag , blastomyces ag to CSF  I am DC carbapenem and starting IV PCN high dose   His HIV has been well controlled so direct CNS pathology from HIV is ruled out  [facility] [employer] at [phone] told me they have 3 cc of CSF in addition to roughly 4 we have here   #2 ESBL in lungs: changing to cipro to avoid double beta lactam and higher risk for seizures  #3 HIV/AIDS: continue current regimen  I spent greater than 35 minutes with the patient including greater than 50% of time in face to face counsel of the patient re differeential of his meningoencephalitis and in coordination of his care w primary team, labs.     LOS: 8 days   Alcide Evener 12/09/2016, 8:58 AM

## 2016-12-09 NOTE — Progress Notes (Signed)
Nutrition Follow-up Assessment  DOCUMENTATION CODES:   Not applicable  INTERVENTION:    Continue Vital AF 1.2 at 75 ml/h (1800 ml per day) to provide 2160 kcals, 135 gm protein, 1460 ml free water daily.  Provides 2355 kcal total with TF + Propofol.  NUTRITION DIAGNOSIS:   Inadequate oral intake related to inability to eat as evidenced by NPO status.  Ongoing  GOAL:   Patient will meet greater than or equal to 90% of their needs   Met with TF  MONITOR:   Vent status, Labs, I & O's  ASSESSMENT:   51 year old man with HIV/AIDS (first diagnosed 2003, last CD4 count 245 with viremia of 1,160 in October 2017; on Genovya, Darunavir and Bactrim), Genital Warts, COPD, and cocaine substance abuse who presented to [facility] on 1/[facility] with altered mental status. Intubated for airway protection and transferred to [facility].  Discussed patient in ICU rounds and with RN today. Being treated for bacterial meningitis, HSV meningitis, and Klebsiella PNA. Patient remains intubated on ventilator support MV: 12 L/min Temp (24hrs), Avg:99.5 F (37.5 C), Min:98.8 F (37.1 C), Max:100.2 F (37.9 C)  Propofol: 7.4 ml/h providing 195 kcal from lipid Labs and medications reviewed.  Diet Order:  Diet NPO time specified  Skin:  Wound (see comment) (non pressure wound to scrotum)  Last BM:  PTA--Rectal tube placed 1/28  Height:   Ht Readings from Last 1 Encounters:  12/01/16 6' 4"  (1.93 m)    Weight:   Wt Readings from Last 1 Encounters:  12/09/16 233 lb 4 oz (105.8 kg)   12/01/16 205 lb (93 kg)     BMI=25    Ideal Body Weight:  91.8 kg  BMI:  Body mass index is 28.39 kg/m.  Estimated Nutritional Needs:   Kcal:  2300  Protein:  120-140 gm  Fluid:  2.3 L  EDUCATION NEEDS:   No education needs identified at this time  Molli Barrows, [place], [place], [place] Pager [phone] After Hours Pager [phone]

## 2016-12-09 NOTE — Progress Notes (Signed)
RT transported patient from 16M to MRI and back without any complications.

## 2016-12-09 NOTE — Progress Notes (Signed)
STROKE TEAM PROGRESS NOTE   SUBJECTIVE (INTERVAL HISTORY) No family at bedside. His RN at bedside. Pt mental status much improved from yesterday. Currently still intubated on sedation but patient open eyes, following commands on BUEs, but still not moving BLEs. 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 ID is working on to rule out syphilis. Currently on a second year, Cipro and penicillin   OBJECTIVE Temp:  [98.8 F (37.1 C)-100.2 F (37.9 C)] 98.8 F (37.1 C) (02/01 1553) Pulse Rate:  [69-88] 69 (02/01 1621) Cardiac Rhythm: Normal sinus rhythm (02/01 1600) Resp:  [13-22] 13 (02/01 1621) BP: (96-152)/(52-88) 96/56 (02/01 1621) SpO2:  [91 %-100 %] 98 % (02/01 1621) FiO2 (%):  [30 %] 30 % (02/01 1621) Weight:  [233 lb 4 oz (105.8 kg)] 233 lb 4 oz (105.8 kg) (02/01 0213)  CBC:   Recent Labs Lab 12/08/16 0421 12/09/16 0306  WBC 5.7 7.6  HGB 8.0* 8.2*  HCT 25.1* 25.7*  MCV 97.3 97.0  PLT 214 228    Basic Metabolic Panel:   Recent Labs Lab 12/08/16 0421 12/08/16 1629 12/09/16 0306  NA 135 137 137  K 4.1 4.4 4.4  CL 98* 99* 99*  CO2 31 30 33*  GLUCOSE 161* 169* 126*  BUN 24* 27* 25*  CREATININE 1.03 1.20 1.11  CALCIUM 7.9* 8.0* 8.0*  MG 2.0  --  2.1  PHOS 3.0  --  3.6    Lipid Panel:     Component Value Date/Time   CHOL 141 12/09/2016 0310   TRIG 155 (H) 12/09/2016 0310   TRIG 162 (H) 12/09/2016 0310   HDL 16 (L) 12/09/2016 0310   CHOLHDL 8.8 12/09/2016 0310   VLDL 32 12/09/2016 0310   LDLCALC 93 12/09/2016 0310   HgbA1c:  Lab Results  Component Value Date   HGBA1C 6.6 (H) 12/03/2016   Urine Drug Screen: No results found for: LABOPIA, COCAINSCRNUR, LABBENZ, AMPHETMU, THCU, LABBARB    IMAGING I have personally reviewed the radiological images below and agree with the radiology interpretations.  Ct Head Wo Contrast 12/02/2016 1. Negative for bleed or other acute intracranial process. 2. Mild bilateral maxillary and sphenoid sinus disease.   Mr Lodema Pilot  Contrast 12/04/2016 1. 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 This pattern could be related to vasospasm from the patient's meningitis, or from central/cardiac embolic disease. 2. Sulcal signal could be meningitis debris or artifact from elevated FiO2. Negative for intracranial collection or intraventricular debris. 3. History of HSV positive CSF PCR. No typical temporal/limbic encephalitis pattern. 4. Generalized sinusitis and mastoiditis.   2-D echocardiogram - Left ventricle: The cavity size was normal. Wall thickness was normal. Systolic function was normal. The estimated ejection fraction was in the range of 60% to 65%. Wall motion was normal; there were no regional wall motion abnormalities. Indeterminant diastolic function. - Aortic valve: There was no stenosis. - Mitral valve: There was no significant regurgitation. Valve area by pressure half-time: 2.37 cm^2. - Right ventricle: The cavity size was mildly dilated. Systolic function was normal. - Right atrium: The atrium was mildly dilated. - Tricuspid valve: Peak RV-RA gradient (S): 25 mm Hg. - Pulmonary arteries: PA peak pressure: 40 mm Hg (S). - Systemic veins: IVC measured 3.1 cm with < 50% respirophasic variation, suggesting RA pressure 15 mmHg. Impressions:   Normal LV size with EF 60-65%. Mildly dilated RV with normal systolic function. Mildly dilated RA. Mild pulmonary hypertension.  EEG This sedated EEG is abnormal due  to diffuse slowing and suppression of the background. Clinical Correlation of the above findings indicates diffuse cerebral dysfunction that is non-specific in etiology but likely secondary to pharmacologic effect.  However, such findings can be seen with hypoxic/ischemic injury, toxic/metabolic encephalopathies, or neurodegenerative disorders.  If further evaluation is required, consider repeating the EEG off of sedation.   Mr Maxine Glenn Head Wo Contrast 12/09/2016 IMPRESSION: MRA HEAD: Irregular RIGHT  internal carotid artery, vessel remains patent. No emergent large vessel occlusion or severe stenosis. No intracranial vasculopathy by MRA though catheter angiography is more sensitive. MRA NECK: Limited due to delayed phase. Beaded appearance of the RIGHT cervical internal carotid artery most compatible with fibromuscular dysplasia though focal HIV vasculopathy is possible.     PHYSICAL EXAM  Temp:  [98.8 F (37.1 C)-100.2 F (37.9 C)] 98.8 F (37.1 C) (02/01 1553) Pulse Rate:  [69-88] 69 (02/01 1621) Resp:  [13-22] 13 (02/01 1621) BP: (96-152)/(52-88) 96/56 (02/01 1621) SpO2:  [91 %-100 %] 98 % (02/01 1621) FiO2 (%):  [30 %] 30 % (02/01 1621) Weight:  [233 lb 4 oz (105.8 kg)] 233 lb 4 oz (105.8 kg) (02/01 0213)  General - Well nourished, well developed, intubated on sedation.  Ophthalmologic - Fundi not visualized.  Cardiovascular - Regular rate and rhythm.  Neuro - intubated on sedation, lethargic, open eyes on voice, following midline commands and peripheral commands on bilateral upper extremities. With eye opening, eyes slight upward position but able to move bilaterally, pupils 2.52mm, reactive to light, facial symmetrical. BUE 3/5 strength, however, no movement at BLEs even with pain. Muscle tone decreased BLEs. DTR diminished and no babinski. Sensation not cooperative, coordination and gait not tested.    ASSESSMENT/PLAN Mr. Lawrence Kane is a 51 y.o. 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 CSF positive for HSV. Concern for cardioembolic versus vasculitic source of embolic stroke. He did not receive IV t-PA due to unknown last known well.   Meningoencephalitis  Etiology unclear  CSF elevated WBC, RBC, protein. 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 Endocarditis is in the DDx but BCx and CSF culture so far all negative. Infection related hypercoagulable state also in DDx, but less likely given prominent intracranial processes.  Resultant  intubation, paraplegia  CT head no acute abnormality. Mild bilateral maxillary and sphenoid sinus disease   MRI  bilateral cerebellum and cerebral cortex punctate infarcts. Sulcal signal.   MRA HEAD and neck right ICA FMD pattern  2D Echo  EF 60-65%  EEG diffuse slowing in suppression of background  LDL  93  HgbA1c 6.6  Heparin 5000 units sq tid for VTE prophylaxis Diet NPO time specified  No antithrombotic prior to admission, now on aspirin 325 mg daily. Continue ASA.   Ongoing aggressive stroke risk factor management  Therapy recommendations:  Order when appropriate   Disposition:  pending   HIV / AIDS  Following with [facility] ID  On HAART as outpt  08/2016 CD4 245  On admission CD4 = 10  ID on board  Hyperlipidemia  LDL 93, goal < 70  Not statin candidate due to elevated AST/ALT  Tobacco abuse  Current smoker  Smoking cessation counseling will be provided  Other Stroke Risk Factors  ETOH use   History of cocaine abuse  Other Active Problems  Transaminitis, HCV+, chronic Hep B, reflexive hepatitis C  Persistent eosinophilia CSF   Left lower lobe pneumonia - ESBL Klebsiella   History of personality disorder   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 This patient's care requires constant monitoring of vital signs, hemodynamics, respiratory and cardiac monitoring, review of multiple databases, neurological assessment, discussion with family, other specialists and medical decision making of high complexity. I spent 35 minutes of neurocritical care time in the care of this patient.  Marvel PlanJindong Tayla Panozzo, MD PhD Stroke Neurology 12/09/2016 5:18 PM   To contact Stroke Continuity provider, please refer to [email or web address]. After hours, contact General Neurology

## 2016-12-10 DIAGNOSIS — G839 Paralytic syndrome, unspecified: Secondary | ICD-10-CM

## 2016-12-10 DIAGNOSIS — G8220 Paraplegia, unspecified: Secondary | ICD-10-CM

## 2016-12-10 DIAGNOSIS — J810 Acute pulmonary edema: Secondary | ICD-10-CM

## 2016-12-10 DIAGNOSIS — Z960 Presence of urogenital implants: Secondary | ICD-10-CM

## 2016-12-10 LAB — CBC
HEMATOCRIT: 26.1 % — AB (ref 39.0–52.0)
HEMOGLOBIN: 8.3 g/dL — AB (ref 13.0–17.0)
MCH: 30.5 pg (ref 26.0–34.0)
MCHC: 31.8 g/dL (ref 30.0–36.0)
MCV: 96 fL (ref 78.0–100.0)
Platelets: 277 10*3/uL (ref 150–400)
RBC: 2.72 MIL/uL — AB (ref 4.22–5.81)
RDW: 17.6 % — ABNORMAL HIGH (ref 11.5–15.5)
WBC: 10.1 10*3/uL (ref 4.0–10.5)

## 2016-12-10 LAB — GLUCOSE, CAPILLARY
GLUCOSE-CAPILLARY: 103 mg/dL — AB (ref 65–99)
GLUCOSE-CAPILLARY: 192 mg/dL — AB (ref 65–99)
GLUCOSE-CAPILLARY: 196 mg/dL — AB (ref 65–99)
GLUCOSE-CAPILLARY: 128 mg/dL — AB (ref 65–99)
Glucose-Capillary: 142 mg/dL — ABNORMAL HIGH (ref 65–99)
Glucose-Capillary: 144 mg/dL — ABNORMAL HIGH (ref 65–99)
Glucose-Capillary: 157 mg/dL — ABNORMAL HIGH (ref 65–99)
Glucose-Capillary: 169 mg/dL — ABNORMAL HIGH (ref 65–99)
Glucose-Capillary: 170 mg/dL — ABNORMAL HIGH (ref 65–99)
Glucose-Capillary: 206 mg/dL — ABNORMAL HIGH (ref 65–99)
Glucose-Capillary: 209 mg/dL — ABNORMAL HIGH (ref 65–99)

## 2016-12-10 LAB — MISC LABCORP TEST (SEND OUT): Labcorp test code: 9985

## 2016-12-10 LAB — COMPREHENSIVE METABOLIC PANEL
ALK PHOS: 83 U/L (ref 38–126)
ALT: 397 U/L — AB (ref 17–63)
AST: 418 U/L — ABNORMAL HIGH (ref 15–41)
Albumin: 1.7 g/dL — ABNORMAL LOW (ref 3.5–5.0)
Anion gap: 7 (ref 5–15)
BILIRUBIN TOTAL: 1.2 mg/dL (ref 0.3–1.2)
BUN: 29 mg/dL — AB (ref 6–20)
CALCIUM: 7.8 mg/dL — AB (ref 8.9–10.3)
CO2: 31 mmol/L (ref 22–32)
CREATININE: 1.19 mg/dL (ref 0.61–1.24)
Chloride: 93 mmol/L — ABNORMAL LOW (ref 101–111)
Glucose, Bld: 180 mg/dL — ABNORMAL HIGH (ref 65–99)
Potassium: 4.7 mmol/L (ref 3.5–5.1)
Sodium: 131 mmol/L — ABNORMAL LOW (ref 135–145)
Total Protein: 7.2 g/dL (ref 6.5–8.1)

## 2016-12-10 LAB — ACID FAST SMEAR (AFB): ACID FAST SMEAR - AFSCU2: NEGATIVE

## 2016-12-10 LAB — HIV-1 RNA QUANT-NO REFLEX-BLD
HIV 1 RNA QUANT: 70 {copies}/mL
LOG10 HIV-1 RNA: 1.845 {Log_copies}/mL

## 2016-12-10 LAB — PHOSPHORUS: PHOSPHORUS: 2.7 mg/dL (ref 2.5–4.6)

## 2016-12-10 LAB — VDRL, CSF: SYPHILIS VDRL QUANT CSF: NONREACTIVE

## 2016-12-10 LAB — HISTOPLASMA ANTIGEN, URINE: Histoplasma Antigen, urine: <0.5 (ref <0.5)

## 2016-12-10 LAB — MAGNESIUM: MAGNESIUM: 2.1 mg/dL (ref 1.7–2.4)

## 2016-12-10 LAB — RPR: RPR: NONREACTIVE

## 2016-12-10 MED ORDER — SODIUM CHLORIDE 0.9 % IV SOLN
2.0000 g | Freq: Three times a day (TID) | INTRAVENOUS | Status: DC
  Administered 2016-12-10 – 2016-12-14 (×12): 2 g via INTRAVENOUS
  Filled 2016-12-10 (×14): qty 2

## 2016-12-10 MED ORDER — INSULIN ASPART 100 UNIT/ML ~~LOC~~ SOLN
4.0000 [IU] | SUBCUTANEOUS | Status: DC
  Administered 2016-12-10 – 2016-12-15 (×27): 4 [IU] via SUBCUTANEOUS

## 2016-12-10 MED ORDER — INSULIN GLARGINE 100 UNIT/ML ~~LOC~~ SOLN
15.0000 [IU] | SUBCUTANEOUS | Status: DC
  Administered 2016-12-10 – 2016-12-12 (×3): 15 [IU] via SUBCUTANEOUS
  Filled 2016-12-10 (×3): qty 0.15

## 2016-12-10 MED ORDER — DEXTROSE 10 % IV SOLN: INTRAVENOUS | Status: DC | PRN

## 2016-12-10 MED ORDER — VANCOMYCIN HCL 10 G IV SOLR
1250.0000 mg | Freq: Three times a day (TID) | INTRAVENOUS | Status: DC
  Administered 2016-12-10 – 2016-12-13 (×8): 1250 mg via INTRAVENOUS
  Filled 2016-12-10 (×10): qty 1250

## 2016-12-10 MED ORDER — INSULIN ASPART 100 UNIT/ML ~~LOC~~ SOLN
0.0000 [IU] | SUBCUTANEOUS | Status: DC
  Administered 2016-12-10: 3 [IU] via SUBCUTANEOUS
  Administered 2016-12-10 (×2): 7 [IU] via SUBCUTANEOUS
  Administered 2016-12-10: 4 [IU] via SUBCUTANEOUS
  Administered 2016-12-11: 3 [IU] via SUBCUTANEOUS
  Administered 2016-12-11: 7 [IU] via SUBCUTANEOUS
  Administered 2016-12-11 (×2): 4 [IU] via SUBCUTANEOUS
  Administered 2016-12-11 – 2016-12-12 (×3): 7 [IU] via SUBCUTANEOUS
  Administered 2016-12-12: 4 [IU] via SUBCUTANEOUS
  Administered 2016-12-12: 7 [IU] via SUBCUTANEOUS
  Administered 2016-12-12: 3 [IU] via SUBCUTANEOUS
  Administered 2016-12-12: 11 [IU] via SUBCUTANEOUS
  Administered 2016-12-13: 7 [IU] via SUBCUTANEOUS
  Administered 2016-12-13: 4 [IU] via SUBCUTANEOUS
  Administered 2016-12-13: 7 [IU] via SUBCUTANEOUS
  Administered 2016-12-13: 4 [IU] via SUBCUTANEOUS
  Administered 2016-12-13: 3 [IU] via SUBCUTANEOUS
  Administered 2016-12-14: 4 [IU] via SUBCUTANEOUS
  Administered 2016-12-14: 3 [IU] via SUBCUTANEOUS
  Administered 2016-12-14: 4 [IU] via SUBCUTANEOUS
  Administered 2016-12-14 (×3): 3 [IU] via SUBCUTANEOUS
  Administered 2016-12-15: 11 [IU] via SUBCUTANEOUS
  Administered 2016-12-15: 20 [IU] via SUBCUTANEOUS
  Administered 2016-12-15: 15 [IU] via SUBCUTANEOUS
  Administered 2016-12-15: 11 [IU] via SUBCUTANEOUS
  Administered 2016-12-15 (×2): 7 [IU] via SUBCUTANEOUS
  Administered 2016-12-15: 4 [IU] via SUBCUTANEOUS
  Administered 2016-12-16: 15 [IU] via SUBCUTANEOUS

## 2016-12-10 NOTE — Progress Notes (Addendum)
Pharmacy Antibiotic Note  Lawrence Kane is a 51 y.o. 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 Per ID, would like to also cover for MRSA.  Pharmacy has been consulted for vancomycin and meropenem. Patient with negative RPR so PCN was discontinued. Patient was previously on therapeutic vancomycin at 1250 mg IV q8h, renal function remains stable.  Plan: - Vancomycin 1250 mg IV q8h - Meropenem 2g IV q8h - Continue acyclovir 1000 mg IV q8h - Monitor cultures, renal function and clinical progression  Height: _0  (193 cm) Weight: 233 lb 11 oz (106 kg) IBW/kg (Calculated) : 86.8  Temp (24hrs), Avg:99.1 F (37.3 C), Min:98.8 F (37.1 C), Max:99.6 F (37.6 C)   Recent Labs Lab 12/04/16 1300  12/06/16 0417 12/07/16 0540 12/08/16 0421 12/08/16 1629 12/09/16 0306 12/10/16 0424  WBC  --   < > 12.1* 7.9 5.7  --  7.6 10.1  CREATININE  --   < > 0.96 1.05 1.03 1.20 1.11 1.19  VANCOTROUGH 12*  --   --  19  --   --   --   --   < > = values in this interval not displayed.  Estimated Creatinine Clearance: 98.2 mL/min (by C-G formula based on SCr of 1.19 mg/dL).    Allergies  Allergen Reactions  . Iodine   . Ketorolac     unknown  . Tylenol [Acetaminophen]     unknown    Antimicrobials this admission: Ceftriaxone 1/24 (started at OSH) >> 1/30 Vancomycin 1/24 (started at OSH) >> 1/30 Ampicillin 1/25 >>1/27 Acyclovir 1/25 >>1/26; 1/27>> Bactrim 1/25 (PCP prophylaxis) >> 1/29 Meropenem 1/30 >> 2/1  2/2 >> Ciprofloxacin 2/1 >> 2/2 PCN 2/1 >>2/2  Dose adjustments this admission: VT - 12 1/27 on 1 g q8h VT - 19 1/30 on 1264m q8h: no changes  Microbiology results: 1/22 scrotal wound MRSA 1/24 BCx x 2 ([name]): 1/24 HSV PCR ([name]: HSV2 POS 1/24 Cryptococcal ([name]: 1/24 CSF ([name]: gram stain no organism  1/24 Trach asp: ngtd 1/24 MRSA PCR: positive 1/25 BCx x2: ngtd 1/25 cryptococcal:  negative 1/25 CSF culture (post abx): ngtd 1/25 CSF fungal Cx: neg 1/25 CSF AFB: neg 1/25 Toxoplasma: neg 1/27 stronglyoids ab: negative 1/28 fungus from bronch: none observed 1/28 AFB bronch: neg 1/28 PCP bronch: neg 1/28 BAL: 40,000 klebsiella pneumonia ESBL - no need to treat per ID. no parasites 1/29 CSF: ngtd 1/31 BCx: ng<12h 2/1 RPR: Negative  Thank you for allowing pharmacy to be a part of this patient's care.  TDimitri Ped PharmD, BCPS PGY-2 Infectious Diseases Pharmacy Resident Pager: [phone] 41802/12/2016 11:32 AM

## 2016-12-10 NOTE — Progress Notes (Signed)
Pt placed back on rest mode due to increased agitation and rr 38bpm.

## 2016-12-10 NOTE — Progress Notes (Addendum)
Subjective:  Pt intubated, sedated  Antibiotics:  Anti-infectives    Start     Dose/Rate Route Frequency Ordered Stop   12/09/16 1000  penicillin G potassium 4 Million Units in dextrose 5 % 250 mL IVPB  Status:  Discontinued     4 Million Units 250 mL/hr over 60 Minutes Intravenous Every 4 hours 12/09/16 0851 12/10/16 1109   12/09/16 1000  ciprofloxacin (CIPRO) IVPB 400 mg  Status:  Discontinued     400 mg 200 mL/hr over 60 Minutes Intravenous Every 12 hours 12/09/16 0929 12/10/16 1109   12/07/16 2245  meropenem (MERREM) 2 g in sodium chloride 0.9 % 100 mL IVPB  Status:  Discontinued     2 g 200 mL/hr over 30 Minutes Intravenous Every 8 hours 12/07/16 2236 12/09/16 0851   12/04/16 2200  vancomycin (VANCOCIN) 1,250 mg in sodium chloride 0.9 % 250 mL IVPB  Status:  Discontinued     1,250 mg 166.7 mL/hr over 90 Minutes Intravenous Every 8 hours 12/04/16 1421 12/07/16 1057   12/04/16 1000  acyclovir (ZOVIRAX) 1,000 mg in dextrose 5 % 150 mL IVPB     1,000 mg 170 mL/hr over 60 Minutes Intravenous Every 8 hours 12/04/16 0943     12/03/16 0900  acyclovir (ZOVIRAX) 1,080 mg in dextrose 5 % 250 mL IVPB  Status:  Discontinued     1,080 mg 271.6 mL/hr over 60 Minutes Intravenous Every 8 hours 12/03/16 0651 12/03/16 0843   12/03/16 0900  acyclovir (ZOVIRAX) 1,000 mg in dextrose 5 % 150 mL IVPB  Status:  Discontinued     1,000 mg 170 mL/hr over 60 Minutes Intravenous Every 8 hours 12/03/16 0843 12/03/16 1002   12/02/16 1015  darunavir-cobicistat (PREZCOBIX) 800-150 MG per tablet 1 tablet     1 tablet Oral Daily with breakfast 12/02/16 1003     12/02/16 1000  emtricitabine-tenofovir AF (DESCOVY) 200-25 MG per tablet 1 tablet     1 tablet Per Tube Daily 12/01/16 2320     12/02/16 1000  dolutegravir (TIVICAY) tablet 50 mg     50 mg Oral Daily 12/01/16 2320     12/02/16 1000  vancomycin (VANCOCIN) 1,250 mg in sodium chloride 0.9 % 250 mL IVPB  Status:  Discontinued     1,250 mg 166.7  mL/hr over 90 Minutes Intravenous Every 24 hours 12/02/16 0010 12/02/16 0634   12/02/16 0900  acyclovir (ZOVIRAX) 930 mg in dextrose 5 % 150 mL IVPB  Status:  Discontinued     10 mg/kg  93 kg 168.6 mL/hr over 60 Minutes Intravenous Every 8 hours 12/02/16 0632 12/03/16 0651   12/02/16 0700  vancomycin (VANCOCIN) IVPB 1000 mg/200 mL premix  Status:  Discontinued     1,000 mg 200 mL/hr over 60 Minutes Intravenous Every 8 hours 12/02/16 0634 12/04/16 1421   12/02/16 0400  cefTRIAXone (ROCEPHIN) 2 g in dextrose 5 % 50 mL IVPB  Status:  Discontinued     2 g 100 mL/hr over 30 Minutes Intravenous Every 12 hours 12/02/16 0010 12/07/16 2222   12/02/16 0100  acyclovir (ZOVIRAX) 1,110 mg in dextrose 5 % 250 mL IVPB  Status:  Discontinued     10 mg/kg  111.1 kg 272.2 mL/hr over 60 Minutes Intravenous Every 8 hours 12/02/16 0010 12/02/16 0632   12/02/16 0100  sulfamethoxazole-trimethoprim (BACTRIM,SEPTRA) 200-40 MG/5ML suspension 20 mL  Status:  Discontinued     20 mL Per Tube Once per day on Mon  Wed Fri 12/02/16 0010 12/06/16 1416   12/02/16 0000  ampicillin (OMNIPEN) 2 g in sodium chloride 0.9 % 50 mL IVPB  Status:  Discontinued     2 g 150 mL/hr over 20 Minutes Intravenous Every 4 hours 12/01/16 2322 12/04/16 0941      Medications: Scheduled Meds: . acyclovir  1,000 mg Intravenous Q8H  . aspirin  325 mg Per Tube Daily  . chlorhexidine gluconate (MEDLINE KIT)  15 mL Mouth Rinse BID  . clonazepam  2 mg Per Tube TID  . darunavir-cobicistat  1 tablet Oral Q breakfast  . dolutegravir  50 mg Oral Daily  . emtricitabine-tenofovir AF  1 tablet Per Tube Daily  . famotidine  20 mg Per Tube BID  . fentaNYL (SUBLIMAZE) injection  50 mcg Intravenous Once  . furosemide  40 mg Intravenous Q12H  . heparin  5,000 Units Subcutaneous Q8H  . insulin aspart  0-20 Units Subcutaneous Q4H  . insulin aspart  4 Units Subcutaneous Q4H  . insulin glargine  15 Units Subcutaneous Q24H  . mouth rinse  15 mL Mouth Rinse  10 times per day  . mupirocin ointment  1 application Nasal BID   Continuous Infusions: . dextrose    . feeding supplement (VITAL AF 1.2 CAL) 1,000 mL (12/10/16 1113)  . fentaNYL infusion INTRAVENOUS 200 mcg/hr (12/09/16 2356)  . propofol (DIPRIVAN) infusion 20 mcg/kg/min (12/10/16 1048)   PRN Meds:.sodium chloride, acetaminophen, chlorproMAZINE (THORAZINE) IV, dextrose, docusate, fentaNYL, hydrALAZINE, ibuprofen, midazolam, sennosides, sodium chloride flush    Objective: Weight change: 7.1 oz (0.2 kg)  Intake/Output Summary (Last 24 hours) at 12/10/16 1139 Last data filed at 12/10/16 1100  Gross per 24 hour  Intake          5310.67 ml  Output             4000 ml  Net          1310.67 ml   Blood pressure 119/63, pulse 68, temperature 99.6 F (37.6 C), temperature source Oral, resp. rate 20, height 6' 4" (1.93 m), weight 233 lb 11 oz (106 kg), SpO2 97 %. Temp:  [98.8 F (37.1 C)-99.6 F (37.6 C)] 99.6 F (37.6 C) (02/02 0824) Pulse Rate:  [66-80] 68 (02/02 1100) Resp:  [13-38] 20 (02/02 1100) BP: (90-127)/(50-67) 119/63 (02/02 1100) SpO2:  [93 %-99 %] 97 % (02/02 1100) FiO2 (%):  [30 %] 30 % (02/02 0928) Weight:  [233 lb 11 oz (106 kg)] 233 lb 11 oz (106 kg) (02/02 0500)  Physical Exam: General: sedated due to problems weaning HEENT: anicteric sclera, pupils reactive to light and accommodation, EOMI CVS tachy  rate, normal r,  no murmur rubs or gallops Chest: rhonchi Abdomen: soft nontender, nondistended, normal bowel sounds, Extremities: no  clubbing or edema noted bilaterally Skin/GU: genital warts and mx areas of hypopigmentation  12/09/16:        Neuro: cannot move feet, legs at all  CBC:  CBC Latest Ref Rng & Units 12/10/2016 12/09/2016 12/08/2016  WBC 4.0 - 10.5 K/uL 10.1 7.6 5.7  Hemoglobin 13.0 - 17.0 g/dL 8.3(L) 8.2(L) 8.0(L)  Hematocrit 39.0 - 52.0 % 26.1(L) 25.7(L) 25.1(L)  Platelets 150 - 400 K/uL 277 228 214      BMET  Recent Labs   12/09/16 0306 12/10/16 0424  NA 137 131*  K 4.4 4.7  CL 99* 93*  CO2 33* 31  GLUCOSE 126* 180*  BUN 25* 29*  CREATININE 1.11 1.19  CALCIUM 8.0* 7.8*  Liver Panel   Recent Labs  12/09/16 0306 12/10/16 0424  PROT 6.6 7.2  ALBUMIN 1.6* 1.7*  AST 242* 418*  ALT 333* 397*  ALKPHOS 71 83  BILITOT 1.0 1.2       Sedimentation Rate No results for input(s): ESRSEDRATE in the last 72 hours. C-Reactive Protein No results for input(s): CRP in the last 72 hours.  Micro Results: Recent Results (from the past 720 hour(s))  MRSA PCR Screening     Status: Abnormal   Collection Time: 12/01/16 10:26 PM  Result Value Ref Range Status   MRSA by PCR POSITIVE (A) NEGATIVE Final    Comment:        The GeneXpert MRSA Assay (FDA approved for NASAL specimens only), is one component of a comprehensive MRSA colonization surveillance program. It is not intended to diagnose MRSA infection nor to guide or monitor treatment for MRSA infections. RESULT CALLED TO, READ BACK BY AND VERIFIED WITH: R DONAHUE,RN _0  12/02/16 MKELLY,MLT   Culture, respiratory (tracheal aspirate)     Status: None   Collection Time: 12/01/16 11:05 PM  Result Value Ref Range Status   Specimen Description TRACHEAL ASPIRATE  Final   Special Requests NONE  Final   Gram Stain   Final    FEW WBC PRESENT,BOTH PMN AND MONONUCLEAR NO ORGANISMS SEEN    Culture Consistent with normal respiratory flora.  Final   Report Status 12/04/2016 FINAL  Final  Culture, blood (routine x 2)     Status: None   Collection Time: 12/02/16 12:00 AM  Result Value Ref Range Status   Specimen Description BLOOD LEFT HAND  Final   Special Requests AEROBIC BOTTLE ONLY 10ML  Final   Culture NO GROWTH 5 DAYS  Final   Report Status 12/07/2016 FINAL  Final  Culture, blood (routine x 2)     Status: None   Collection Time: 12/02/16 12:04 AM  Result Value Ref Range Status   Specimen Description BLOOD RIGHT HAND  Final   Special  Requests BOTTLES DRAWN AEROBIC AND ANAEROBIC 5ML  Final   Culture NO GROWTH 5 DAYS  Final   Report Status 12/07/2016 FINAL  Final  Acid Fast Smear (AFB)     Status: None   Collection Time: 12/02/16  9:00 AM  Result Value Ref Range Status   AFB Specimen Processing Concentration  Final   Acid Fast Smear Negative  Final    Comment: (NOTE) Performed At: [facility] [place], [place] [id number] Lindon Romp MD [id number]    Source (AFB) ACID FAST BACILLI  Corrected  CSF culture     Status: None   Collection Time: 12/02/16  2:24 PM  Result Value Ref Range Status   Specimen Description CSF  Final   Special Requests NONE  Final   Gram Stain   Final    CYTOSPIN SMEAR WBC PRESENT,BOTH PMN AND MONONUCLEAR NO ORGANISMS SEEN    Culture NO GROWTH 3 DAYS  Final   Report Status 12/06/2016 FINAL  Final  Culture, fungus without smear     Status: None (Preliminary result)   Collection Time: 12/02/16  2:25 PM  Result Value Ref Range Status   Specimen Description CSF  Final   Special Requests NONE  Final   Culture   Final    NO FUNGUS ISOLATED AFTER 7 DAYS CONTINUING TO HOLD FOR 6 WEEKS PER PHYSICIAN REQUEST   Report Status PENDING  Incomplete  Culture, bal-quantitative     Status: Abnormal  Collection Time: 12/05/16  9:49 AM  Result Value Ref Range Status   Specimen Description BRONCHIAL ALVEOLAR LAVAGE  Final   Special Requests NONE  Final   Gram Stain NO WBC SEEN NO ORGANISMS SEEN  Final   Culture (A)  Final    40,000 COLONIES/mL KLEBSIELLA PNEUMONIAE Confirmed Extended Spectrum Beta-Lactamase Producer (ESBL)    Report Status 12/07/2016 FINAL  Final   Organism ID, Bacteria KLEBSIELLA PNEUMONIAE (A)  Final      Susceptibility   Klebsiella pneumoniae - MIC*    AMPICILLIN >=32 RESISTANT Resistant     CEFAZOLIN >=64 RESISTANT Resistant     CEFEPIME 2 RESISTANT Resistant     CEFTAZIDIME 4 RESISTANT Resistant     CEFTRIAXONE >=64 RESISTANT Resistant      CIPROFLOXACIN 1 SENSITIVE Sensitive     GENTAMICIN >=16 RESISTANT Resistant     IMIPENEM <=0.25 SENSITIVE Sensitive     TRIMETH/SULFA >=320 RESISTANT Resistant     AMPICILLIN/SULBACTAM >=32 RESISTANT Resistant     PIP/TAZO 16 SENSITIVE Sensitive     Extended ESBL POSITIVE Resistant     * 40,000 COLONIES/mL KLEBSIELLA PNEUMONIAE  Pneumocystis smear by DFA     Status: None   Collection Time: 12/05/16  9:49 AM  Result Value Ref Range Status   Specimen Source-PJSRC BRONCHIAL ALVEOLAR LAVAGE  Final   Pneumocystis jiroveci Ag NEGATIVE  Final    Comment: Performed at [place] [facility]  Fungus Culture With Stain     Status: None (Preliminary result)   Collection Time: 12/05/16  9:49 AM  Result Value Ref Range Status   Fungus Stain Final report  Final    Comment: (NOTE) Performed At: [facility] [place], [place] [id number] Lindon Romp MD YY:[id number]    Fungus (Mycology) Culture PENDING  Incomplete   Fungal Source BRONCHIAL ALVEOLAR LAVAGE  Final  Fungus Culture Result     Status: None   Collection Time: 12/05/16  9:49 AM  Result Value Ref Range Status   Result 1 Comment  Final    Comment: (NOTE) KOH/Calcofluor preparation:  no fungus observed. Performed At: [facility] [place], [place] [id number] Lindon Romp MD [id number]   CSF culture     Status: None   Collection Time: 12/06/16  4:54 PM  Result Value Ref Range Status   Specimen Description CSF  Final   Special Requests NONE  Final   Gram Stain   Final    WBC PRESENT,BOTH PMN AND MONONUCLEAR NO ORGANISMS SEEN CYTOSPIN    Culture NO GROWTH 3 DAYS  Final   Report Status 12/09/2016 FINAL  Final  Culture, blood (routine x 2)     Status: None (Preliminary result)   Collection Time: 12/08/16  9:37 AM  Result Value Ref Range Status   Specimen Description BLOOD RIGHT ANTECUBITAL  Final   Special Requests BOTTLES DRAWN AEROBIC AND ANAEROBIC 5CC  Final    Culture NO GROWTH 1 DAY  Final   Report Status PENDING  Incomplete  Culture, blood (routine x 2)     Status: None (Preliminary result)   Collection Time: 12/08/16  9:41 AM  Result Value Ref Range Status   Specimen Description BLOOD BLOOD RIGHT ARM  Final   Special Requests BOTTLES DRAWN AEROBIC AND ANAEROBIC 5CC  Final   Culture NO GROWTH 1 DAY  Final   Report Status PENDING  Incomplete    Studies/Results: Mr Jodene Nam Head Wo Contrast  Result Date:  12/09/2016 CLINICAL DATA:  Altered mental status, follow-up multiple embolic infarcts. Cerebral spinal fluid positive for HSV. History of HIV/aids. EXAM: MRA HEAD WITHOUT CONTRAST MRA NECK WITHOUT AND WITH CONTRAST TECHNIQUE: Angiographic images of the Circle of Willis were obtained using MRA technique without intravenous contrast. Angiographic images of the neck were obtained using MRA technique without and with intravenous contrast. Carotid stenosis measurements (when applicable) are obtained utilizing NASCET criteria, using the distal internal carotid diameter as the denominator. CONTRAST:  20 cc MultiHance COMPARISON:  MRI of the head December 04, 2016 FINDINGS: MRA HEAD FINDINGS ANTERIOR CIRCULATION: Thready irregular RIGHT cervical internal carotid artery through the level of the carotid siphon. Robust LEFT internal carotid artery. Bilateral anterior cerebral arteries arise from LEFT A1-2 junction. Normal appearance of the anterior and middle cerebral arteries. No large vessel occlusion, high-grade stenosis, abnormal luminal irregularity, aneurysm. POSTERIOR CIRCULATION: LEFT vertebral artery is dominant. Basilar artery is patent, with normal flow related enhancement of the main branch vessels. Normal flow related enhancement of the posterior cerebral arteries. No large vessel occlusion, high-grade stenosis, abnormal luminal irregularity, aneurysm. ANATOMIC VARIANTS: None. MRA NECK FINDINGS- delayed phase, venous contamination. ANTERIOR CIRCULATION: The  common carotid arteries are widely patent bilaterally. The carotid bifurcation is patent bilaterally and there is no hemodynamically significant carotid stenosis by NASCET criteria. Beaded appearance of the RIGHT cervical internal carotid artery to the intracranial segments. POSTERIOR CIRCULATION: Bilateral vertebral arteries are patent to the vertebrobasilar junction. No evidence for atherosclerosis or flow limiting stenosis. Bilateral posterior communicating arteries present. Source images and MIP image were reviewed. IMPRESSION: MRA HEAD: Irregular RIGHT internal carotid artery, vessel remains patent. No emergent large vessel occlusion or severe stenosis. No intracranial vasculopathy by MRA though catheter angiography is more sensitive. MRA NECK: Limited due to delayed phase. Beaded appearance of the RIGHT cervical internal carotid artery most compatible with fibromuscular dysplasia though focal HIV vasculopathy is possible. Electronically Signed   By: Elon Alas M.D.   On: 12/09/2016 02:37   Mr Jodene Nam Neck W Contrast  Result Date: 12/09/2016 CLINICAL DATA:  Altered mental status, follow-up multiple embolic infarcts. Cerebral spinal fluid positive for HSV. History of HIV/aids. EXAM: MRA HEAD WITHOUT CONTRAST MRA NECK WITHOUT AND WITH CONTRAST TECHNIQUE: Angiographic images of the Circle of Willis were obtained using MRA technique without intravenous contrast. Angiographic images of the neck were obtained using MRA technique without and with intravenous contrast. Carotid stenosis measurements (when applicable) are obtained utilizing NASCET criteria, using the distal internal carotid diameter as the denominator. CONTRAST:  20 cc MultiHance COMPARISON:  MRI of the head December 04, 2016 FINDINGS: MRA HEAD FINDINGS ANTERIOR CIRCULATION: [place] irregular RIGHT cervical internal carotid artery through the level of the carotid siphon. Robust LEFT internal carotid artery. Bilateral anterior cerebral arteries arise  from LEFT A1-2 junction. Normal appearance of the anterior and middle cerebral arteries. No large vessel occlusion, high-grade stenosis, abnormal luminal irregularity, aneurysm. POSTERIOR CIRCULATION: LEFT vertebral artery is dominant. Basilar artery is patent, with normal flow related enhancement of the main branch vessels. Normal flow related enhancement of the posterior cerebral arteries. No large vessel occlusion, high-grade stenosis, abnormal luminal irregularity, aneurysm. ANATOMIC VARIANTS: None. MRA NECK FINDINGS- delayed phase, venous contamination. ANTERIOR CIRCULATION: The common carotid arteries are widely patent bilaterally. The carotid bifurcation is patent bilaterally and there is no hemodynamically significant carotid stenosis by NASCET criteria. Beaded appearance of the RIGHT cervical internal carotid artery to the intracranial segments. POSTERIOR CIRCULATION: Bilateral vertebral arteries are patent to the vertebrobasilar  junction. No evidence for atherosclerosis or flow limiting stenosis. Bilateral posterior communicating arteries present. Source images and MIP image were reviewed. IMPRESSION: MRA HEAD: Irregular RIGHT internal carotid artery, vessel remains patent. No emergent large vessel occlusion or severe stenosis. No intracranial vasculopathy by MRA though catheter angiography is more sensitive. MRA NECK: Limited due to delayed phase. Beaded appearance of the RIGHT cervical internal carotid artery most compatible with fibromuscular dysplasia though focal HIV vasculopathy is possible. Electronically Signed   By: Elon Alas M.D.   On: 12/09/2016 02:37   Dg Chest Port 1 View  Result Date: 12/09/2016 CLINICAL DATA:  Intubation.  Shortness of breath. EXAM: PORTABLE CHEST 1 VIEW COMPARISON:  12/08/2016. FINDINGS: Endotracheal tube and NG tube in stable position. Stable cardiomegaly. Bilateral pulmonary infiltrates/edema are again noted, slightly progressed from prior exam. Small left  pleural effusion noted on today's exam. No pneumothorax . IMPRESSION: 1. Lines and tubes in stable position. 2. Stable cardiomegaly. 3. Bilateral pulmonary infiltrates/edema, slightly progressed from prior exam. Small left pleural effusion noted on today's exam . Electronically Signed   By: [place]   On: 12/09/2016 07:18      Assessment/Plan:  INTERVAL HISTORY:  RPR NR   Active Problems:   COPD (chronic obstructive pulmonary disease) (HCC)   Substance abuse   Genital warts   HIV (human immunodeficiency virus infection) ([place])   AIDS (acquired immune deficiency syndrome) ([place])   Noncompliance   Bacterial meningitis   Hypertensive urgency   Septic shock (HCC)   Transaminitis   Cocaine abuse   Open wound   Acute respiratory failure (HCC)   Altered mental status   Acute encephalopathy   Cerebral embolism with cerebral infarction   Endotracheally intubated   Meningoencephalitis   Neurosyphilis   Herpesviral meningitis    Lawrence Kane is a 51 y.o. male with  HIV/AIDS (though prior to acute illness his CD4 was >200 and his virus remains well controlled) with meningoencephalitis, evidence of infarcts on MRI, lower extremity paralysis. 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  Will continue to treat with acyclovir   Neurosyphilis would have made sense but RPR is NR and doubt prozone effect AFTER several days of therapy. Will repeat and ask for prozone  I agree with Neurology that his spine needs to be imaged he may have epidural abscess in L spine or higher  We will change abx back to Vancomycin to cover for MRSA, merrem for the ESBL in the lungs and for broad coverage of CNS and epidural infection, acyclovir for HSV2  I still  also worry about fungal meningitis with  dimorphic fungus such as histo, blasto possible (not sure if [place] has been [place])   MAYBE the MRI will give [place] a target for IR biopsy for bacterial cultures, fungal cultures, AFB cultures and tissue to path  (which we could then send for PCR)  I would also consider repeat LARGE volume LP with 30 ml of CSF and then sent 10-12 dedicated ml for fungal culture and similarly for AFB culture, along with MTB PCR  I would also consider TEE  His HIV has been well controlled so direct CNS pathology from HIV is ruled out  [employer] at  [phone] told me they have 3 cc of CSF in addition to roughly 4 we have here   #2 ESBL in lungs: will be covered with merrem  #3 HIV/AIDS: continue current regimen and he will need OI prophylaxis but will hold off while diagnostic tesitng going on  I spent greater than 35 minutes with the patient including greater than 50% of time incoordination of his care w primary team, labs.   Dr. Johnnye Sima will followup to see the patient this weekend.    LOS: 9 days   Alcide Evener 12/10/2016, 11:39 AM

## 2016-12-10 NOTE — Progress Notes (Signed)
STROKE TEAM PROGRESS NOTE   SUBJECTIVE (INTERVAL HISTORY) No family at bedside. Neuro condition similar to yesterday, Open eyes on voice, follows commands on both upper extremities. However bilateral lower extremity flaccid, no DTR and no Babinski. Need to rule out spinal cord pathology.   OBJECTIVE Temp:  [98.5 F (36.9 C)-99.7 F (37.6 C)] 98.5 F (36.9 C) (02/02 1556) Pulse Rate:  [68-80] 70 (02/02 1800) Cardiac Rhythm: Normal sinus rhythm (02/02 1400) Resp:  [16-38] 16 (02/02 1800) BP: (90-127)/(50-67) 107/58 (02/02 1800) SpO2:  [93 %-100 %] 98 % (02/02 1800) FiO2 (%):  [30 %] 30 % (02/02 1554) Weight:  [233 lb 11 oz (106 kg)] 233 lb 11 oz (106 kg) (02/02 0500)  CBC:   Recent Labs Lab 12/09/16 0306 12/10/16 0424  WBC 7.6 10.1  HGB 8.2* 8.3*  HCT 25.7* 26.1*  MCV 97.0 96.0  PLT 228 277    Basic Metabolic Panel:   Recent Labs Lab 12/09/16 0306 12/10/16 0424  NA 137 131*  K 4.4 4.7  CL 99* 93*  CO2 33* 31  GLUCOSE 126* 180*  BUN 25* 29*  CREATININE 1.11 1.19  CALCIUM 8.0* 7.8*  MG 2.1 2.1  PHOS 3.6 2.7    Lipid Panel:     Component Value Date/Time   CHOL 141 12/09/2016 0310   TRIG 155 (H) 12/09/2016 0310   TRIG 162 (H) 12/09/2016 0310   HDL 16 (L) 12/09/2016 0310   CHOLHDL 8.8 12/09/2016 0310   VLDL 32 12/09/2016 0310   LDLCALC 93 12/09/2016 0310   HgbA1c:  Lab Results  Component Value Date   HGBA1C 6.6 (H) 12/03/2016   Urine Drug Screen: No results found for: LABOPIA, COCAINSCRNUR, LABBENZ, AMPHETMU, THCU, LABBARB    IMAGING I have personally reviewed the radiological images below and agree with the radiology interpretations.  Ct Head Wo Contrast 12/02/2016 1. Negative for bleed or other acute intracranial process. 2. Mild bilateral maxillary and sphenoid sinus disease.   Mr Lodema Pilot Contrast 12/04/2016 1. 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 This pattern could be related to vasospasm from the patient's meningitis,  or from central/cardiac embolic disease. 2. Sulcal signal could be meningitis debris or artifact from elevated FiO2. Negative for intracranial collection or intraventricular debris. 3. History of HSV positive CSF PCR. No typical temporal/limbic encephalitis pattern. 4. Generalized sinusitis and mastoiditis.   2-D echocardiogram - Left ventricle: The cavity size was normal. Wall thickness was normal. Systolic function was normal. The estimated ejection fraction was in the range of 60% to 65%. Wall motion was normal; there were no regional wall motion abnormalities. Indeterminant diastolic function. - Aortic valve: There was no stenosis. - Mitral valve: There was no significant regurgitation. Valve area by pressure half-time: 2.37 cm^2. - Right ventricle: The cavity size was mildly dilated. Systolic function was normal. - Right atrium: The atrium was mildly dilated. - Tricuspid valve: Peak RV-RA gradient (S): 25 mm Hg. - Pulmonary arteries: PA peak pressure: 40 mm Hg (S). - Systemic veins: IVC measured 3.1 cm with < 50% respirophasic variation, suggesting RA pressure 15 mmHg. Impressions:   Normal LV size with EF 60-65%. Mildly dilated RV with normal systolic function. Mildly dilated RA. Mild pulmonary hypertension.  EEG This sedated EEG is abnormal due to diffuse slowing and suppression of the background. Clinical Correlation of the above findings indicates diffuse cerebral dysfunction that is non-specific in etiology but likely secondary to pharmacologic effect.  However, such findings can be seen  with hypoxic/ischemic injury, toxic/metabolic encephalopathies, or neurodegenerative disorders.  If further evaluation is required, consider repeating the EEG off of sedation.   Mr Maxine GlennMra Head Wo Contrast 12/09/2016 IMPRESSION: MRA HEAD: Irregular RIGHT internal carotid artery, vessel remains patent. No emergent large vessel occlusion or severe stenosis. No intracranial vasculopathy by MRA though catheter  angiography is more sensitive. MRA NECK: Limited due to delayed phase. Beaded appearance of the RIGHT cervical internal carotid artery most compatible with fibromuscular dysplasia though focal HIV vasculopathy is possible.   MRI C/T/L-spine with and without contrast pending   PHYSICAL EXAM  Temp:  [98.5 F (36.9 C)-99.7 F (37.6 C)] 98.5 F (36.9 C) (02/02 1556) Pulse Rate:  [68-80] 70 (02/02 1800) Resp:  [16-38] 16 (02/02 1800) BP: (90-127)/(50-67) 107/58 (02/02 1800) SpO2:  [93 %-100 %] 98 % (02/02 1800) FiO2 (%):  [30 %] 30 % (02/02 1554) Weight:  [233 lb 11 oz (106 kg)] 233 lb 11 oz (106 kg) (02/02 0500)  General - Well nourished, well developed, intubated off sedation.  Ophthalmologic - Fundi not visualized.  Cardiovascular - Regular rate and rhythm.  Neuro - intubated off sedation, lethargic, open eyes on voice, following midline commands and peripheral commands on bilateral upper extremities. With eye opening, eyes slight upward position but able to move bilaterally, pupils 2.675mm, reactive to light, facial symmetrical. BUE 3/5 strength, 1+ DTR, no Hoffmann sign. However, no movement at BLEs even with pain. Muscle tone decreased BLEs. DTR diminished and no babinski. Sensation not cooperative, coordination and gait not tested.    ASSESSMENT/PLAN Mr. Patrick NorthDarren E Ollinger is a 51 y.o. 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 CSF positive for HSV. Concern for cardioembolic versus vasculitic source of embolic stroke. He did not receive IV t-PA due to unknown last known well.   Meningoencephalitis  Etiology unclear  CSF elevated WBC, RBC, protein. 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 Endocarditis is in the DDx but BCx and CSF culture so far all negative. Infection related hypercoagulable state also in DDx, but less likely given prominent intracranial processes.  Resultant  intubation, paraplegia  CT head no acute abnormality. Mild bilateral maxillary and sphenoid sinus disease   MRI  bilateral cerebellum and cerebral cortex punctate infarcts. Sulcal signal.   MRA HEAD and neck right ICA FMD pattern  2D Echo  EF 60-65%  EEG diffuse slowing in suppression of background  Agree with ID for TEE once stabilized to rule out endocarditis  LDL  93  HgbA1c 6.6  Heparin 5000 units sq tid for VTE prophylaxis Diet NPO time specified  No antithrombotic prior to admission, now on aspirin 325 mg daily. Continue ASA.   Ongoing aggressive stroke risk factor management  Therapy recommendations:  Order when appropriate   Disposition:  pending   HIV / AIDS  [facility]  On HAART as outpt  08/2016  CD4 245  On admission CD4 = 10  ID on board  Hyperlipidemia  LDL 93, goal < 70  Not statin candidate due to elevated AST/ALT  Tobacco abuse  Current smoker  Smoking cessation counseling will be provided  Other Stroke Risk Factors  ETOH use   History of cocaine abuse  Other Active Problems  Transaminitis, HCV+, chronic Hep B, reflexive hepatitis C  Persistent eosinophilia CSF   Left lower lobe pneumonia - ESBL Klebsiella   History of personality disorder   S/p recent scrotal condyloma removal with new lesions at scrotal and penis - ID following  Hospital day # 9  This patient is  critically ill due to meningoencephalitis, paraplegia, HIV, embolic stroke, coma, sepsis and at significant risk of neurological worsening, death form neuro worsening, recurrent stroke, septic shock, brain herniation, DIC and brain death. This patient's care requires constant monitoring of vital signs, hemodynamics, respiratory and cardiac monitoring, review of multiple databases, neurological assessment, discussion with family, other specialists and medical decision making of high complexity. I spent 40 minutes of neurocritical care time in the care of this patient.  Marvel Plan, MD PhD Stroke Neurology 12/10/2016 7:21 PM   To contact Stroke Continuity provider, please refer to [email or web address]. After hours, contact General Neurology

## 2016-12-10 NOTE — Progress Notes (Signed)
PULMONARY / CRITICAL CARE MEDICINE   Name: Lawrence Kane MRN: [id number] DOB: [birth year]    ADMISSION DATE:  12/01/2016 CONSULTATION DATE:  12/01/2016  REFERRING MD:  [name] transfer  CHIEF COMPLAINT:  AMS  HISTORY OF PRESENT ILLNESS:   51 year old man with HIV/AIDS (first diagnosed 2003, last CD4 count 245 with viremia of 1,160 in October 2017; on Genovya, Darunavir and Bactrim), Genital Warts, COPD, and cocaine substance abuse who presented to [facility] on 1/24 with complaint of altered mental status. Per notes, patient may have recently had some type of surgery in [place] where his mother resides. Afebrile, tachycardic to 155, RR 18, hypertensive to 240/112. Patient was intubated for airway protection, CVL in IJ was placed, and LP was performed given his altered mental status. He was started on Versed and propofol for sedation. Initial labs showed leukocytosis of 13.9, hemoglobin 10.9, hct 33.6 and plts 269. BMET showed Sodium 139, K 3.9, Cl 101, Bicarb 21, BUN 23, Creatine 1.10, glucose 163. ABG showed pH of 7.28, pCO2 48, pO2 158, on FiO2 of 40%, PEEP 5, TV 450 and RR 18. Lactic Acid 3.3, 6.9. AST 125, ALT 224, and Alk Phos 88. UA negative for infection. UDS positive for cocaine. CSF appeared yellow, cloudy, xanthochromic, WBC 4845, RBC 625, glucose <20, total protein >300, Neutrophils 16%, Eos 64%, monocytes 17%, lymphocytes 3%. Gram stain performed, but unable to view results. ID was consulted. Dr. Baxter Flattery recommended continuing Truvada and Tivicay through NGT  SUBJECTIVE:  On and off agitation.  Less fevers.  Not moving legs, flaccid BLE (allegedly not moving BLE since admission)  VITAL SIGNS: BP 119/63   Pulse 68   Temp 99.6 F (37.6 C) (Oral)   Resp 20   Ht _0  (1.93 m)   Wt 106 kg (233 lb 11 oz)   SpO2 97%   BMI 28.45 kg/m   VENTILATOR SETTINGS: Vent Mode: PRVC FiO2 (%):  [30 %] 30 % Set Rate:  [16 bmp] 16 bmp Vt Set:  [620 mL] 620 mL PEEP:  [5 cmH20] 5  cmH20 Pressure Support:  [5 cmH20] 5 cmH20 Plateau Pressure:  [15 cmH20-18 cmH20] 18 cmH20  INTAKE / OUTPUT: I/O last 3 completed shifts: In: 7078.6 [I.V.:1698.6; NG/GT:2800; IV Piggyback:2580] Out: 6650 [Urine:6650]  PHYSICAL EXAMINATION: General Apperance: NAD HEENT: Normocephalic, atraumatic, anicteric sclera Neck: Supple, trachea midline Lungs: good ae, crackles at bases. (-) rhochi/wheeze.  Heart: Regular rate and rhythm, no murmur/rub/gallop Abdomen: Soft, nontender, nondistended Extremities: Warm and well perfused, Gr 2 edema.  Skin: Condyloma on lips Neurologic: Sedated. In and out of confusion/agitation.  Moves BUE.  BLE remain flaccid.   LABS:  BMET  Recent Labs Lab 12/08/16 1629 12/09/16 0306 12/10/16 0424  NA 137 137 131*  K 4.4 4.4 4.7  CL 99* 99* 93*  CO2 30 33* 31  BUN 27* 25* 29*  CREATININE 1.20 1.11 1.19  GLUCOSE 169* 126* 180*    Electrolytes  Recent Labs Lab 12/08/16 0421 12/08/16 1629 12/09/16 0306 12/10/16 0424  CALCIUM 7.9* 8.0* 8.0* 7.8*  MG 2.0  --  2.1 2.1  PHOS 3.0  --  3.6 2.7    CBC  Recent Labs Lab 12/08/16 0421 12/09/16 0306 12/10/16 0424  WBC 5.7 7.6 10.1  HGB 8.0* 8.2* 8.3*  HCT 25.1* 25.7* 26.1*  PLT 214 228 277    Coag's  Recent Labs Lab 12/06/16 1215  INR 1.18    Sepsis Markers No results for input(s): LATICACIDVEN, PROCALCITON, O2SATVEN in the  last 168 hours.  ABG  Recent Labs Lab 12/08/16 2325  PHART 7.424  PCO2ART 52.5*  PO2ART 78.3*    Liver Enzymes  Recent Labs Lab 12/08/16 0421 12/09/16 0306 12/10/16 0424  AST 320* 242* 418*  ALT 342* 333* 397*  ALKPHOS 55 71 83  BILITOT 0.6 1.0 1.2  ALBUMIN 1.6* 1.6* 1.7*    Cardiac Enzymes  Recent Labs Lab 12/03/16 2152 12/04/16 0211 12/04/16 0811  TROPONINI 0.04* 0.04* <0.03    Glucose  Recent Labs Lab 12/10/16 0207 12/10/16 0300 12/10/16 0406 12/10/16 0520 12/10/16 0627 12/10/16 0728  GLUCAP 144* 157* 103* 192* 209* 196*     Imaging No results found.   STUDIES:  1/24 CXR >> ATX vs LLL infiltrate 1/25 CT head >> neg for acute intracranial process 1/25 LP cytology >> no malignant cells 1/25 LP >> yellow, cloudy, glucose 20, total protein >600, RBC 114>49, WBC 970, 36% neutrophil, 20% lymphs, 19% eos 1/28 BAL cell count >> 266 WBC, eos 1 1/28 BAL cytology >> no malignant cells 1/27 BAL for wet mount> neg for parasites 1/27 MRI brain >> small acute infarcts in cerebellum and cerebral cortex, sucal signal 1/29 LP >> Glucose 82, protein >600, WBC 600>885, eosinophils 28% 1/29 Echo >> LV EF 60-65%, indeterminent diastolic function, wall motion normal 1/30 EEG >> diffuse slowing and background suppression, no seizures   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 1/24 Cryptococcal [name]): negative 1/24 CSF [facility]): NGTD 1/22 scrotal wound cx [name]): MRSA Toxoplasma [name]): Negative 1/25 BCx x2 : NGTD 1/25 HCV ab + 1/24 Trach asp 1/24: NGTD 1/24 MRSA PCR: MRSA pos 1/25 HIV quant: 80, CD4 10 1/25 CSF AFB: Smear neg, >> 1/25 CSF fungal cx: NGTD 1/25 CSF crypto ag: neg 1/25 CSF gram stain no organisms, cx: NGTD 1/27 strongyloides ab >> negative 1/27 coccidiomycosis ab >> 1/28 BAL AFB smear/cx> 1/28 BAL Fungus cx >> 1/28 BAL gram stain/Cx: 40K ESBL Klebsiella pneumoniae 1/28 BAL pneumocystis smear: Neg 1/27 O&P 1/29 CSF Cx >> no organisms on gram stain, >> NGTD   ANTIBIOTICS: Rocephin 1/24>>1/30 Vancomycin 1/24>>1/30; 2/2 >  Acyclovir 1/24>> 1/26, 1/27>> Ampicillin 1/24>> 1/27 Bactrim M/W/F for PCP prophylaxis Meropenem 1/30>> 2/1; 2/2 >  Pen G 2/1 >> 2/2 Cipro 2/1 >> 2/2  SIGNIFICANT EVENTS: 1/24> Transferred from [facility] 1/25> Underwent LP here  LINES/TUBES: L IJ CVL 1/24 >> ETT 1/24 >> Foley 1/24 >> OGT 1/24 >>  DISCUSSION: 51 year old man with HIV/AIDS (first diagnosed 2003, last CD4 count 245 with viremia of 1,160 in October  2017; on Genovya, Darunavir and Bactrim), Genital Warts, COPD, and cocaine substance abuse who presented to [facility] on 1/24 with altered mental status. Intubated for airway protection.   ASSESSMENT / PLAN:  PULMONARY A: Acute Hypoxemic Respiratory Failure 2/2 HCAP ESBL Klebsiella LLL+ sepsis + Unable to protect airway + pulm edema P:   Continue full vent support. PST as tolerated.  Goehner pt.  Mental status and agitation are barriers to extubation as well.  Daily wake up assessment Antibiotics as per ID section  CARDIOVASCULAR A:  Sinus Tachycardia Incomplete RBBB HTN P:  Hydralazine prn  RENAL A:   Pulm edema P:   Follow labs, urine output, creatinine Lasix 24m IV BID >> observe renal fxn with diuresis and acyclovir.   GASTROINTESTINAL A:   Transaminitis, (worsened by seroquel likely which was discontinued on 1/31) HCV ab+, Chronic hep b P:   Continue tube feeds Follow LFTs  Famotidine 20 mg IV BID  HEMATOLOGIC A:   Mild leukocytosis > Resolved Mild anemia P:  Sub Q heparin for VTE ppx  INFECTIOUS A:   Meningitis, bacterial + viral. No organisms isolated except for HSV Concern for septic emboli as he now has flaccid BLE Concern for Neurosyphilis on 2/1 but VDRL CSF was (-) HSV positive on CSF from [facility] LLL PNA> ESBL Klebsiella  HIV Chronic Hep B Hep C antibody reflex > +, Follow viral load P:   Appreciate ID and Neuro input! Abx switched to Rocephin and vanc added on 2/2. Cont acyclovir Toxoplasma neg Continue antiretrovirals per ID Bactrim MWF ppx  ENDOCRINE A:   Hyperglycemia   P:   Insulin gtt (he can not absorb subq insulin) CBGs Q1H  NEUROLOGIC A:   Metabolic Encephalopathy History of Cocaine Abuse History of Personality Disorder  Small acute CVA in cerebellum and cerebral cortex P:   Continue fentanyl drip, Propofol drip Cont clonazepam 2 mg TID. Will consider decreasing clonazepam if he does not wake up.   RASS goal: 0 to -1 ASA daily Neuro following. MRA head and neck U/R. Plan for MRI of thoiracic/cervical/lumbar areas.  Has Thorazine prn for hiccups  FAMILY  - Updates: Mother updated via the phone on 2/2 and discussed with her over all prognosis.  She wants everything doen.  - Inter-disciplinary family meet or Palliative Care meeting due by:  2/7   I spent  30  minutes of Critical Care time with this patient today.    Monica Becton, MD 12/10/2016, 11:33 AM [place] Pager [phone] After 3 pm or if no answer, call [phone]

## 2016-12-10 NOTE — Progress Notes (Signed)
   [facility] PCCM  Addendum to my note today : Pt was switched from insulin drip to SQ insulin as he had hypoglycemia last 12 hrs. Currently on lantus and TF coverage and SSI. Cont on this regimen for now and observe for hyperglycemia.   Pollie MeyerJ. Angelo A de Dios, MD 12/10/2016, 2:07 PM [place] [facility] Pager [phone] After 3 pm or if no answer, call [phone]

## 2016-12-10 NOTE — Progress Notes (Signed)
eLink Physician-Brief Progress Note Patient Name: Lawrence NorthDarren E Kane DOB: 11/19/1965 MRN: [id number]   Date of Service  12/10/2016  HPI/Events of Note  Request to renew restraint order.  eICU Interventions  Will renew restraint order.     Intervention Category Minor Interventions: Agitation / anxiety - evaluation and management  Sommer,Steven Eugene 12/10/2016, 8:07 PM

## 2016-12-11 ENCOUNTER — Other Ambulatory Visit (HOSPITAL_COMMUNITY): Payer: Medicaid Other

## 2016-12-11 ENCOUNTER — Encounter: Payer: Self-pay | Admitting: Pulmonary Disease

## 2016-12-11 ENCOUNTER — Inpatient Hospital Stay (HOSPITAL_COMMUNITY): Payer: Medicaid Other

## 2016-12-11 LAB — GLUCOSE, CAPILLARY
GLUCOSE-CAPILLARY: 195 mg/dL — AB (ref 65–99)
GLUCOSE-CAPILLARY: 170 mg/dL — AB (ref 65–99)
GLUCOSE-CAPILLARY: 181 mg/dL — AB (ref 65–99)
Glucose-Capillary: 134 mg/dL — ABNORMAL HIGH (ref 65–99)
Glucose-Capillary: 206 mg/dL — ABNORMAL HIGH (ref 65–99)
Glucose-Capillary: 249 mg/dL — ABNORMAL HIGH (ref 65–99)

## 2016-12-11 LAB — CBC
HCT: 27.2 % — ABNORMAL LOW (ref 39.0–52.0)
Hemoglobin: 8.5 g/dL — ABNORMAL LOW (ref 13.0–17.0)
MCH: 30.5 pg (ref 26.0–34.0)
MCHC: 31.3 g/dL (ref 30.0–36.0)
MCV: 97.5 fL (ref 78.0–100.0)
PLATELETS: 286 10*3/uL (ref 150–400)
RBC: 2.79 MIL/uL — AB (ref 4.22–5.81)
RDW: 17.4 % — AB (ref 11.5–15.5)
WBC: 9.6 10*3/uL (ref 4.0–10.5)

## 2016-12-11 LAB — COMPREHENSIVE METABOLIC PANEL
ALT: 391 U/L — ABNORMAL HIGH (ref 17–63)
AST: 322 U/L — AB (ref 15–41)
Albumin: 1.6 g/dL — ABNORMAL LOW (ref 3.5–5.0)
Alkaline Phosphatase: 94 U/L (ref 38–126)
Anion gap: 7 (ref 5–15)
BUN: 25 mg/dL — AB (ref 6–20)
CHLORIDE: 93 mmol/L — AB (ref 101–111)
CO2: 33 mmol/L — AB (ref 22–32)
Calcium: 8.2 mg/dL — ABNORMAL LOW (ref 8.9–10.3)
Creatinine, Ser: 0.97 mg/dL (ref 0.61–1.24)
Glucose, Bld: 211 mg/dL — ABNORMAL HIGH (ref 65–99)
POTASSIUM: 4.5 mmol/L (ref 3.5–5.1)
SODIUM: 133 mmol/L — AB (ref 135–145)
Total Bilirubin: 0.9 mg/dL (ref 0.3–1.2)
Total Protein: 7.4 g/dL (ref 6.5–8.1)

## 2016-12-11 LAB — RPR: RPR: NONREACTIVE

## 2016-12-11 LAB — FLUORESCENT TREPONEMAL AB(FTA)-IGG-BLD: Fluorescent Treponemal Ab, IgG: NONREACTIVE

## 2016-12-11 LAB — MAGNESIUM: MAGNESIUM: 1.9 mg/dL (ref 1.7–2.4)

## 2016-12-11 LAB — PHOSPHORUS: PHOSPHORUS: 3.6 mg/dL (ref 2.5–4.6)

## 2016-12-11 MED ORDER — GADOBENATE DIMEGLUMINE 529 MG/ML IV SOLN
20.0000 mL | Freq: Once | INTRAVENOUS | Status: AC | PRN
  Administered 2016-12-11: 20 mL via INTRAVENOUS

## 2016-12-11 MED ORDER — ROCURONIUM BROMIDE 50 MG/5ML IV SOLN
100.0000 mg | Freq: Once | INTRAVENOUS | Status: AC | PRN
  Administered 2016-12-11: 50 mg via INTRAVENOUS
  Administered 2016-12-11: 100 mg via INTRAVENOUS
  Filled 2016-12-11 (×2): qty 10

## 2016-12-11 MED ORDER — POLYETHYLENE GLYCOL 3350 17 G PO PACK
17.0000 g | PACK | Freq: Every day | ORAL | Status: AC
  Administered 2016-12-11 – 2016-12-18 (×8): 17 g
  Filled 2016-12-11 (×8): qty 1

## 2016-12-11 NOTE — Progress Notes (Signed)
eLink Physician-Brief Progress Note Patient Name: Lawrence NorthDarren E Ringle DOB: 1966-01-26 MRN: [id number]   Date of Service  12/11/2016  HPI/Events of Note  Request to renew restraint orders.   eICU Interventions  Will renew restraint orders.      Intervention Category Minor Interventions: Agitation / anxiety - evaluation and management  Arrionna Serena Dennard Nipugene 12/11/2016, 7:53 PM

## 2016-12-11 NOTE — Plan of Care (Signed)
Problem: Tissue Perfusion: Goal: Cerebral tissue perfusion will improve (applicable to all stroke diagnoses) Outcome: Not Met (add Reason) Unable to educate. Pt intubated and sedated.

## 2016-12-11 NOTE — Progress Notes (Signed)
INFECTIOUS DISEASE PROGRESS NOTE  ID: Lawrence Kane is a 51 y.o. male with  Active Problems:   COPD (chronic obstructive pulmonary disease) (HCC)   Substance abuse   Genital warts   HIV (human immunodeficiency virus infection) ([place])   AIDS (acquired immune deficiency syndrome) ([place])   Noncompliance   Bacterial meningitis   Hypertensive urgency   Septic shock ([place])   Transaminitis   Cocaine abuse   Open wound   Acute respiratory failure (HCC)   Altered mental status   Acute encephalopathy   Cerebral embolism with cerebral infarction   Endotracheally intubated   Meningoencephalitis   Neurosyphilis   Herpesviral meningitis   Paraplegia ([place])   Acute pulmonary edema (HCC)  Subjective: Sedated, on vent.   Abtx:  Anti-infectives    Start     Dose/Rate Route Frequency Ordered Stop   12/10/16 1400  meropenem (MERREM) 2 g in sodium chloride 0.9 % 100 mL IVPB     2 g 200 mL/hr over 30 Minutes Intravenous Every 8 hours 12/10/16 1140     12/10/16 1300  vancomycin (VANCOCIN) 1,250 mg in sodium chloride 0.9 % 250 mL IVPB     1,250 mg 166.7 mL/hr over 90 Minutes Intravenous Every 8 hours 12/10/16 1140     12/09/16 1000  penicillin G potassium 4 Million Units in dextrose 5 % 250 mL IVPB  Status:  Discontinued     4 Million Units 250 mL/hr over 60 Minutes Intravenous Every 4 hours 12/09/16 0851 12/10/16 1109   12/09/16 1000  ciprofloxacin (CIPRO) IVPB 400 mg  Status:  Discontinued     400 mg 200 mL/hr over 60 Minutes Intravenous Every 12 hours 12/09/16 0929 12/10/16 1109   12/07/16 2245  meropenem (MERREM) 2 g in sodium chloride 0.9 % 100 mL IVPB  Status:  Discontinued     2 g 200 mL/hr over 30 Minutes Intravenous Every 8 hours 12/07/16 2236 12/09/16 0851   12/04/16 2200  vancomycin (VANCOCIN) 1,250 mg in sodium chloride 0.9 % 250 mL IVPB  Status:  Discontinued     1,250 mg 166.7 mL/hr over 90 Minutes Intravenous Every 8 hours 12/04/16 1421 12/07/16 1057   12/04/16 1000   acyclovir (ZOVIRAX) 1,000 mg in dextrose 5 % 150 mL IVPB     1,000 mg 170 mL/hr over 60 Minutes Intravenous Every 8 hours 12/04/16 0943     12/03/16 0900  acyclovir (ZOVIRAX) 1,080 mg in dextrose 5 % 250 mL IVPB  Status:  Discontinued     1,080 mg 271.6 mL/hr over 60 Minutes Intravenous Every 8 hours 12/03/16 0651 12/03/16 0843   12/03/16 0900  acyclovir (ZOVIRAX) 1,000 mg in dextrose 5 % 150 mL IVPB  Status:  Discontinued     1,000 mg 170 mL/hr over 60 Minutes Intravenous Every 8 hours 12/03/16 0843 12/03/16 1002   12/02/16 1015  darunavir-cobicistat (PREZCOBIX) 800-150 MG per tablet 1 tablet     1 tablet Oral Daily with breakfast 12/02/16 1003     12/02/16 1000  emtricitabine-tenofovir AF (DESCOVY) 200-25 MG per tablet 1 tablet     1 tablet Per Tube Daily 12/01/16 2320     12/02/16 1000  dolutegravir (TIVICAY) tablet 50 mg     50 mg Oral Daily 12/01/16 2320     12/02/16 1000  vancomycin (VANCOCIN) 1,250 mg in sodium chloride 0.9 % 250 mL IVPB  Status:  Discontinued     1,250 mg 166.7 mL/hr over 90 Minutes Intravenous Every 24 hours  12/02/16 0010 12/02/16 0634   12/02/16 0900  acyclovir (ZOVIRAX) 930 mg in dextrose 5 % 150 mL IVPB  Status:  Discontinued     10 mg/kg  93 kg 168.6 mL/hr over 60 Minutes Intravenous Every 8 hours 12/02/16 0632 12/03/16 0651   12/02/16 0700  vancomycin (VANCOCIN) IVPB 1000 mg/200 mL premix  Status:  Discontinued     1,000 mg 200 mL/hr over 60 Minutes Intravenous Every 8 hours 12/02/16 0634 12/04/16 1421   12/02/16 0400  cefTRIAXone (ROCEPHIN) 2 g in dextrose 5 % 50 mL IVPB  Status:  Discontinued     2 g 100 mL/hr over 30 Minutes Intravenous Every 12 hours 12/02/16 0010 12/07/16 2222   12/02/16 0100  acyclovir (ZOVIRAX) 1,110 mg in dextrose 5 % 250 mL IVPB  Status:  Discontinued     10 mg/kg  111.1 kg 272.2 mL/hr over 60 Minutes Intravenous Every 8 hours 12/02/16 0010 12/02/16 0632   12/02/16 0100  sulfamethoxazole-trimethoprim (BACTRIM,SEPTRA) 200-40  MG/5ML suspension 20 mL  Status:  Discontinued     20 mL Per Tube Once per day on Mon Wed Fri 12/02/16 0010 12/06/16 1416   12/02/16 0000  ampicillin (OMNIPEN) 2 g in sodium chloride 0.9 % 50 mL IVPB  Status:  Discontinued     2 g 150 mL/hr over 20 Minutes Intravenous Every 4 hours 12/01/16 2322 12/04/16 0941      Medications:  Scheduled: . acyclovir  1,000 mg Intravenous Q8H  . aspirin  325 mg Per Tube Daily  . chlorhexidine gluconate (MEDLINE KIT)  15 mL Mouth Rinse BID  . clonazepam  2 mg Per Tube TID  . darunavir-cobicistat  1 tablet Oral Q breakfast  . dolutegravir  50 mg Oral Daily  . emtricitabine-tenofovir AF  1 tablet Per Tube Daily  . famotidine  20 mg Per Tube BID  . fentaNYL (SUBLIMAZE) injection  50 mcg Intravenous Once  . furosemide  40 mg Intravenous Q12H  . heparin  5,000 Units Subcutaneous Q8H  . insulin aspart  0-20 Units Subcutaneous Q4H  . insulin aspart  4 Units Subcutaneous Q4H  . insulin glargine  15 Units Subcutaneous Q24H  . mouth rinse  15 mL Mouth Rinse 10 times per day  . meropenem (MERREM) IV  2 g Intravenous Q8H  . polyethylene glycol  17 g Per Tube Daily  . vancomycin  1,250 mg Intravenous Q8H    Objective: Vital signs in last 24 hours: Temp:  [98.5 F (36.9 C)-99.7 F (37.6 C)] 99.5 F (37.5 C) (02/03 0820) Pulse Rate:  [63-76] 71 (02/03 0900) Resp:  [16-25] 25 (02/03 0900) BP: (98-144)/(50-79) 138/79 (02/03 0900) SpO2:  [92 %-100 %] 99 % (02/03 0900) FiO2 (%):  [30 %] 30 % (02/03 0827) Weight:  [106.3 kg (234 lb 5.6 oz)] 106.3 kg (234 lb 5.6 oz) (02/03 0417)   General appearance: sedated on vent.   Lab Results  Recent Labs  12/10/16 0424 12/11/16 0330  WBC 10.1 9.6  HGB 8.3* 8.5*  HCT 26.1* 27.2*  NA 131* 133*  K 4.7 4.5  CL 93* 93*  CO2 31 33*  BUN 29* 25*  CREATININE 1.19 0.97   Liver Panel  Recent Labs  12/10/16 0424 12/11/16 0330  PROT 7.2 7.4  ALBUMIN 1.7* 1.6*  AST 418* 322*  ALT 397* 391*  ALKPHOS 83 94    BILITOT 1.2 0.9   Sedimentation Rate No results for input(s): ESRSEDRATE in the last 72 hours. C-Reactive Protein No  results for input(s): CRP in the last 72 hours.  Microbiology: Recent Results (from the past 240 hour(s))  MRSA PCR Screening     Status: Abnormal   Collection Time: 12/01/16 10:26 PM  Result Value Ref Range Status   MRSA by PCR POSITIVE (A) NEGATIVE Final    Comment:        The GeneXpert MRSA Assay (FDA approved for NASAL specimens only), is one component of a comprehensive MRSA colonization surveillance program. It is not intended to diagnose MRSA infection nor to guide or monitor treatment for MRSA infections. RESULT CALLED TO, READ BACK BY AND VERIFIED WITH: R DONAHUE,RN _0  12/02/16 MKELLY,MLT   Culture, respiratory (tracheal aspirate)     Status: None   Collection Time: 12/01/16 11:05 PM  Result Value Ref Range Status   Specimen Description TRACHEAL ASPIRATE  Final   Special Requests NONE  Final   Gram Stain   Final    FEW WBC PRESENT,BOTH PMN AND MONONUCLEAR NO ORGANISMS SEEN    Culture Consistent with normal respiratory flora.  Final   Report Status 12/04/2016 FINAL  Final  Culture, blood (routine x 2)     Status: None   Collection Time: 12/02/16 12:00 AM  Result Value Ref Range Status   Specimen Description BLOOD LEFT HAND  Final   Special Requests AEROBIC BOTTLE ONLY 10ML  Final   Culture NO GROWTH 5 DAYS  Final   Report Status 12/07/2016 FINAL  Final  Culture, blood (routine x 2)     Status: None   Collection Time: 12/02/16 12:04 AM  Result Value Ref Range Status   Specimen Description BLOOD RIGHT HAND  Final   Special Requests BOTTLES DRAWN AEROBIC AND ANAEROBIC 5ML  Final   Culture NO GROWTH 5 DAYS  Final   Report Status 12/07/2016 FINAL  Final  Acid Fast Smear (AFB)     Status: None   Collection Time: 12/02/16  9:00 AM  Result Value Ref Range Status   AFB Specimen Processing Concentration  Final   Acid Fast Smear Negative  Final     Comment: (NOTE) Performed At: [facility] [place], [place] [id number] Lindon Romp MD OJ:[id number]    Source (AFB) ACID FAST BACILLI  Corrected  CSF culture     Status: None   Collection Time: 12/02/16  2:24 PM  Result Value Ref Range Status   Specimen Description CSF  Final   Special Requests NONE  Final   Gram Stain   Final    CYTOSPIN SMEAR WBC PRESENT,BOTH PMN AND MONONUCLEAR NO ORGANISMS SEEN    Culture NO GROWTH 3 DAYS  Final   Report Status 12/06/2016 FINAL  Final  Culture, fungus without smear     Status: None (Preliminary result)   Collection Time: 12/02/16  2:25 PM  Result Value Ref Range Status   Specimen Description CSF  Final   Special Requests NONE  Final   Culture   Final    NO FUNGUS ISOLATED AFTER 7 DAYS CONTINUING TO HOLD FOR 6 WEEKS PER PHYSICIAN REQUEST   Report Status PENDING  Incomplete  Culture, bal-quantitative     Status: Abnormal   Collection Time: 12/05/16  9:49 AM  Result Value Ref Range Status   Specimen Description BRONCHIAL ALVEOLAR LAVAGE  Final   Special Requests NONE  Final   Gram Stain NO WBC SEEN NO ORGANISMS SEEN  Final   Culture (A)  Final    40,000 COLONIES/mL KLEBSIELLA PNEUMONIAE Confirmed Extended  Spectrum Beta-Lactamase Producer (ESBL)    Report Status 12/07/2016 FINAL  Final   Organism ID, Bacteria KLEBSIELLA PNEUMONIAE (A)  Final      Susceptibility   Klebsiella pneumoniae - MIC*    AMPICILLIN >=32 RESISTANT Resistant     CEFAZOLIN >=64 RESISTANT Resistant     CEFEPIME 2 RESISTANT Resistant     CEFTAZIDIME 4 RESISTANT Resistant     CEFTRIAXONE >=64 RESISTANT Resistant     CIPROFLOXACIN 1 SENSITIVE Sensitive     GENTAMICIN >=16 RESISTANT Resistant     IMIPENEM <=0.25 SENSITIVE Sensitive     TRIMETH/SULFA >=320 RESISTANT Resistant     AMPICILLIN/SULBACTAM >=32 RESISTANT Resistant     PIP/TAZO 16 SENSITIVE Sensitive     Extended ESBL POSITIVE Resistant     * 40,000 COLONIES/mL KLEBSIELLA  PNEUMONIAE  Pneumocystis smear by DFA     Status: None   Collection Time: 12/05/16  9:49 AM  Result Value Ref Range Status   Specimen Source-PJSRC BRONCHIAL ALVEOLAR LAVAGE  Final   Pneumocystis jiroveci Ag NEGATIVE  Final    Comment: Performed at [facility]  Acid Fast Smear (AFB)     Status: None   Collection Time: 12/05/16  9:49 AM  Result Value Ref Range Status   AFB Specimen Processing Concentration  Final   Acid Fast Smear Negative  Final    Comment: (NOTE) Performed At: [facility] [address] [place], [place] [id number] Lindon Romp MD [id number]    Source (AFB) BRONCHIAL ALVEOLAR LAVAGE  Final  Fungus Culture With Stain     Status: None (Preliminary result)   Collection Time: 12/05/16  9:49 AM  Result Value Ref Range Status   Fungus Stain Final report  Final    Comment: (NOTE) Performed At: [facility] [place], [place] [id number] Lindon Romp MD DX:[id number]    Fungus (Mycology) Culture PENDING  Incomplete   Fungal Source BRONCHIAL ALVEOLAR LAVAGE  Final  Fungus Culture Result     Status: None   Collection Time: 12/05/16  9:49 AM  Result Value Ref Range Status   Result 1 Comment  Final    Comment: (NOTE) KOH/Calcofluor preparation:  no fungus observed. Performed At: [facility] [place], [place] [id number] Lindon Romp MD EZ:[id number]   CSF culture     Status: None   Collection Time: 12/06/16  4:54 PM  Result Value Ref Range Status   Specimen Description CSF  Final   Special Requests NONE  Final   Gram Stain   Final    WBC PRESENT,BOTH PMN AND MONONUCLEAR NO ORGANISMS SEEN CYTOSPIN    Culture NO GROWTH 3 DAYS  Final   Report Status 12/09/2016 FINAL  Final  Culture, blood (routine x 2)     Status: None (Preliminary result)   Collection Time: 12/08/16  9:37 AM  Result Value Ref Range Status   Specimen Description BLOOD RIGHT ANTECUBITAL  Final   Special Requests  BOTTLES DRAWN AEROBIC AND ANAEROBIC 5CC  Final   Culture NO GROWTH 2 DAYS  Final   Report Status PENDING  Incomplete  Culture, blood (routine x 2)     Status: None (Preliminary result)   Collection Time: 12/08/16  9:41 AM  Result Value Ref Range Status   Specimen Description BLOOD BLOOD RIGHT ARM  Final   Special Requests BOTTLES DRAWN AEROBIC AND ANAEROBIC 5CC  Final   Culture NO GROWTH 2 DAYS  Final  Report Status PENDING  Incomplete    Studies/Results: No results found.   Assessment/Plan: AIDS (Darunavir/c- [place]) Meningoencephalitis Bilateral punctate infarcts Paraplegia HSV2+ CSF ESBL K pneumo in BAL Hepatitis   LFTs are worsening Await MRI of C/T/L (pt being transported at this time, seen only briefly) No change in his anbx, antivirals.  Cr stable on ACV  Total days of antibiotics: 10 (vanco, merrem), ACV day [place] [facility] (pager) [phone] [email or web address] 12/11/2016, 10:34 AM  LOS: 10 days

## 2016-12-11 NOTE — Progress Notes (Signed)
STROKE TEAM PROGRESS NOTE   SUBJECTIVE (INTERVAL HISTORY) No family at bedside. Exam on propofol.  Did not open eyes to voice or follow commands on today's exam. MRI of the spine was completed today and findings are documented below.  Of note, there are multifocal dorsal thoracic cord signal abnormality from myelitis or potentially infarct which could explain some of the leg findings when superimposed on the other co-morbities   OBJECTIVE Temp:  [98 F (36.7 C)-99.7 F (37.6 C)] 98 F (36.7 C) (02/03 1623) Pulse Rate:  [63-117] 69 (02/03 1900) Cardiac Rhythm: Normal sinus rhythm (02/03 0800) Resp:  [13-25] 19 (02/03 1900) BP: (108-144)/(50-79) 127/65 (02/03 1900) SpO2:  [90 %-100 %] 93 % (02/03 1900) FiO2 (%):  [30 %] 30 % (02/03 1551) Weight:  [106.3 kg (234 lb 5.6 oz)] 106.3 kg (234 lb 5.6 oz) (02/03 0417)  CBC:   Recent Labs Lab 12/10/16 0424 12/11/16 0330  WBC 10.1 9.6  HGB 8.3* 8.5*  HCT 26.1* 27.2*  MCV 96.0 97.5  PLT 277 286    Basic Metabolic Panel:   Recent Labs Lab 12/10/16 0424 12/11/16 0330  NA 131* 133*  K 4.7 4.5  CL 93* 93*  CO2 31 33*  GLUCOSE 180* 211*  BUN 29* 25*  CREATININE 1.19 0.97  CALCIUM 7.8* 8.2*  MG 2.1 1.9  PHOS 2.7 3.6    Lipid Panel:     Component Value Date/Time   CHOL 141 12/09/2016 0310   TRIG 155 (H) 12/09/2016 0310   TRIG 162 (H) 12/09/2016 0310   HDL 16 (L) 12/09/2016 0310   CHOLHDL 8.8 12/09/2016 0310   VLDL 32 12/09/2016 0310   LDLCALC 93 12/09/2016 0310   HgbA1c:  Lab Results  Component Value Date   HGBA1C 6.6 (H) 12/03/2016   Urine Drug Screen: No results found for: LABOPIA, COCAINSCRNUR, LABBENZ, AMPHETMU, THCU, LABBARB    IMAGING I have personally reviewed the radiological images below and agree with the radiology interpretations.  Ct Head Wo Contrast 12/02/2016 1. Negative for bleed or other acute intracranial process. 2. Mild bilateral maxillary and sphenoid sinus disease.   Mr Lodema Pilot  Contrast 12/04/2016 1. 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 This pattern could be related to vasospasm from the patient's meningitis, or from central/cardiac embolic disease. 2. Sulcal signal could be meningitis debris or artifact from elevated FiO2. Negative for intracranial collection or intraventricular debris. 3. History of HSV positive CSF PCR. No typical temporal/limbic encephalitis pattern. 4. Generalized sinusitis and mastoiditis.   2-D echocardiogram - Left ventricle: The cavity size was normal. Wall thickness was normal. Systolic function was normal. The estimated ejection fraction was in the range of 60% to 65%. Wall motion was normal; there were no regional wall motion abnormalities. Indeterminant diastolic function. - Aortic valve: There was no stenosis. - Mitral valve: There was no significant regurgitation. Valve area by pressure half-time: 2.37 cm^2. - Right ventricle: The cavity size was mildly dilated. Systolic function was normal. - Right atrium: The atrium was mildly dilated. - Tricuspid valve: Peak RV-RA gradient (S): 25 mm Hg. - Pulmonary arteries: PA peak pressure: 40 mm Hg (S). - Systemic veins: IVC measured 3.1 cm with < 50% respirophasic variation, suggesting RA pressure 15 mmHg. Impressions:   Normal LV size with EF 60-65%. Mildly dilated RV with normal systolic function. Mildly dilated RA. Mild pulmonary hypertension.  EEG This sedated EEG is abnormal due to diffuse slowing and suppression of the background. Clinical Correlation  of the above findings indicates diffuse cerebral dysfunction that is non-specific in etiology but likely secondary to pharmacologic effect.  However, such findings can be seen with hypoxic/ischemic injury, toxic/metabolic encephalopathies, or neurodegenerative disorders.  If further evaluation is required, consider repeating the EEG off of sedation.   Mr Maxine Glenn Head Wo Contrast 12/09/2016 IMPRESSION: MRA HEAD: Irregular RIGHT  internal carotid artery, vessel remains patent. No emergent large vessel occlusion or severe stenosis. No intracranial vasculopathy by MRA though catheter angiography is more sensitive. MRA NECK: Limited due to delayed phase. Beaded appearance of the RIGHT cervical internal carotid artery most compatible with fibromuscular dysplasia though focal HIV vasculopathy is possible.   MRI C/T/L-spine with and without contrast 12/11/2016 IMPRESSION: 1. No abscess or hematoma as questioned clinically. 2. Generalized meningitis and radiculitis. 3. 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 4. Findings are nonspecific. In addition to the pathogens considered in the chart, would also check CMV. Neurosarcoid can have the same findings, but usually in a different clinical setting.  PHYSICAL EXAM  Temp:  [98 F (36.7 C)-99.7 F (37.6 C)] 98 F (36.7 C) (02/03 1623) Pulse Rate:  [63-117] 69 (02/03 1900) Resp:  [13-25] 19 (02/03 1900) BP: (108-144)/(50-79) 127/65 (02/03 1900) SpO2:  [90 %-100 %] 93 % (02/03 1900) FiO2 (%):  [30 %] 30 % (02/03 1551) Weight:  [106.3 kg (234 lb 5.6 oz)] 106.3 kg (234 lb 5.6 oz) (02/03 0417)  General - Well nourished, well developed, intubated and on sedation.  Cardiovascular - Regular rate and rhythm. Pulmonary:  CTA Abdomen:  Soft ND normal bowel sounds  Neuro - intubated on sedation Does not open eyes to voice Did not follow commands Pupils 2.24mm, reactive to light, facial symmetric to grimace. Eyes slight upward position but able to cross midline with OCR testing.  Left corneal greater than right  Slightly withdraws to very deep stimuli BUE 3/5 strength.  No response in the lower extremmities Muscle tone decreased BLEs.  Coordination and gait not tested.    ASSESSMENT/PLAN Lawrence Kane is a 51 y.o. male with history of HIV/AIDS, Genital Warts, COPD, and cocaine substance presenting initially with with altered  mental status, found to have multiple embolic infarcts cerebellum and cortex. CSF positive for HSV. Concern for cardioembolic versus vasculitic source of embolic stroke. He did not receive IV t-PA due to unknown last known well.   Meningoencephalitis  Etiology unclear  CSF elevated WBC, RBC, protein. 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 Endocarditis is in the DDx but BCx and CSF culture so far all negative. Infection related hypercoagulable state also in DDx, but less likely given prominent intracranial processes.  Resultant  intubation, paraplegia  CT head no acute abnormality. Mild bilateral maxillary and sphenoid sinus disease   MRI  bilateral cerebellum and cerebral cortex punctate infarcts. Sulcal signal.   MRA HEAD and neck right ICA FMD pattern  2D Echo  EF 60-65%  EEG diffuse slowing in suppression of background  Agree with ID for TEE once stabilized to rule out endocarditis  LDL  93  HgbA1c 6.6  Heparin 5000 units sq tid for VTE prophylaxis Diet NPO time specified  No antithrombotic prior to admission, now on aspirin 325 mg daily. Continue ASA.   Ongoing aggressive stroke risk factor management  Therapy recommendations:  Order when appropriate   Disposition:  pending   HIV / AIDS  Following with [facility] ID  On HAART as outpt  08/2016 CD4 245  On admission CD4 = 10  ID on  board  Hyperlipidemia  LDL 93, goal < 70  Not statin candidate due to elevated AST/ALT  Tobacco abuse  Current smoker  Smoking cessation counseling will be provided  Other Stroke Risk Factors  ETOH use   History of cocaine abuse  Other Active Problems  Transaminitis, HCV+, chronic Hep B, reflexive hepatitis C  Persistent eosinophilia CSF   Left lower lobe pneumonia - ESBL Klebsiella   History of personality disorder   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  These in addition to other co-morbidities could explain some of the neurologic findings.  This patient is critically ill due to meningoencephalitis, paraplegia, HIV, embolic stroke, coma, sepsis and at significant risk of neurological worsening, death form neuro worsening, recurrent stroke, septic shock, brain herniation, DIC and brain death. This patient's care requires constant monitoring of vital signs, hemodynamics, respiratory and cardiac monitoring, review of multiple databases, neurological assessment, discussion with family, other specialists and medical decision making of high complexity. I spent 40 minutes of neurocritical care time in the care of this patient.  To contact Stroke Continuity provider, please refer to [email or web address]. After hours, contact General Neurology

## 2016-12-11 NOTE — Telephone Encounter (Signed)
This encounter was created in error - please disregard.

## 2016-12-11 NOTE — Progress Notes (Signed)
PULMONARY / CRITICAL CARE MEDICINE   Name: [name] MRN: [id number] DOB: [birth year]    ADMISSION DATE:  12/01/2016 CONSULTATION DATE:  12/01/2016  REFERRING MD:  [name] transfer  CHIEF COMPLAINT:  AMS  HISTORY OF PRESENT ILLNESS:   51 year old man with HIV/AIDS (first diagnosed 2003, last CD4 count 245 with viremia of 1,160 in October 2017; on Genovya, Darunavir and Bactrim), Genital Warts, COPD, and cocaine substance abuse who presented to [facility] on 1/24 with complaint of altered mental status. Per notes, patient may have recently had some type of surgery in [place] where his mother resides. Afebrile, tachycardic to 155, RR 18, hypertensive to 240/112. Patient was intubated for airway protection, CVL in IJ was placed, and LP was performed given his altered mental status. He was started on Versed and propofol for sedation. Initial labs showed leukocytosis of 13.9, hemoglobin 10.9, hct 33.6 and plts 269. BMET showed Sodium 139, K 3.9, Cl 101, Bicarb 21, BUN 23, Creatine 1.10, glucose 163. ABG showed pH of 7.28, pCO2 48, pO2 158, on FiO2 of 40%, PEEP 5, TV 450 and RR 18. Lactic Acid 3.3, 6.9. AST 125, ALT 224, and Alk Phos 88. UA negative for infection. UDS positive for cocaine. CSF appeared yellow, cloudy, xanthochromic, WBC 4845, RBC 625, glucose <20, total protein >300, Neutrophils 16%, Eos 64%, monocytes 17%, lymphocytes 3%. Gram stain performed, but unable to view results. ID was consulted. Dr. Baxter Flattery recommended continuing Truvada and Tivicay through NGT  SUBJECTIVE:  On and off agitation. Better though Less fevers.  Still with flaccid BLE.   VITAL SIGNS: BP 138/79   Pulse 71   Temp 99.5 F (37.5 C) (Oral)   Resp (!) 25   Ht _0  (1.93 m)   Wt 106.3 kg (234 lb 5.6 oz)   SpO2 99%   BMI 28.53 kg/m   VENTILATOR SETTINGS: Vent Mode: PRVC FiO2 (%):  [30 %] 30 % Set Rate:  [16 bmp] 16 bmp Vt Set:  [620 mL] 620 mL PEEP:  [5 cmH20] 5 cmH20 Plateau Pressure:  [16  cmH20-18 cmH20] 18 cmH20  INTAKE / OUTPUT: I/O last 3 completed shifts: In: 7329.4 [I.V.:1754.4; Other:40; NG/GT:2805; IV Piggyback:2730] Out: 6300 [Urine:6300]  PHYSICAL EXAMINATION: General Apperance: NAD HEENT: Normocephalic, atraumatic, anicteric sclera Neck: Supple, trachea midline Lungs: good ae, crackles at bases. (-) rhochi/wheeze.  Heart: Regular rate and rhythm, no murmur/rub/gallop Abdomen: Soft, nontender, nondistended Extremities: Warm and well perfused, Gr 2 edema.  Skin: Condyloma on lips Neurologic: Sedated. In and out of confusion/agitation.  Moves BUE.  BLE remain flaccid.   LABS:  BMET  Recent Labs Lab 12/09/16 0306 12/10/16 0424 12/11/16 0330  NA 137 131* 133*  K 4.4 4.7 4.5  CL 99* 93* 93*  CO2 33* 31 33*  BUN 25* 29* 25*  CREATININE 1.11 1.19 0.97  GLUCOSE 126* 180* 211*    Electrolytes  Recent Labs Lab 12/09/16 0306 12/10/16 0424 12/11/16 0330  CALCIUM 8.0* 7.8* 8.2*  MG 2.1 2.1 1.9  PHOS 3.6 2.7 3.6    CBC  Recent Labs Lab 12/09/16 0306 12/10/16 0424 12/11/16 0330  WBC 7.6 10.1 9.6  HGB 8.2* 8.3* 8.5*  HCT 25.7* 26.1* 27.2*  PLT 228 277 286    Coag's  Recent Labs Lab 12/06/16 1215  INR 1.18    Sepsis Markers No results for input(s): LATICACIDVEN, PROCALCITON, O2SATVEN in the last 168 hours.  ABG  Recent Labs Lab 12/08/16 2325  PHART 7.424  PCO2ART 52.5*  PO2ART  78.3*    Liver Enzymes  Recent Labs Lab 12/09/16 0306 12/10/16 0424 12/11/16 0330  AST 242* 418* 322*  ALT 333* 397* 391*  ALKPHOS 71 83 94  BILITOT 1.0 1.2 0.9  ALBUMIN 1.6* 1.7* 1.6*    Cardiac Enzymes No results for input(s): TROPONINI, PROBNP in the last 168 hours.  Glucose  Recent Labs Lab 12/10/16 1221 12/10/16 1552 12/10/16 2008 12/10/16 2353 12/11/16 0340 12/11/16 0827  GLUCAP 206* 169* 128* 134* 195* 249*    Imaging No results found.   STUDIES:  1/24 CXR >> ATX vs LLL infiltrate 1/25 CT head >> neg for acute  intracranial process 1/25 LP cytology >> no malignant cells 1/25 LP >> yellow, cloudy, glucose 20, total protein >600, RBC 114>49, WBC 970, 36% neutrophil, 20% lymphs, 19% eos 1/28 BAL cell count >> 266 WBC, eos 1 1/28 BAL cytology >> no malignant cells 1/27 BAL for wet mount> neg for parasites 1/27 MRI brain >> small acute infarcts in cerebellum and cerebral cortex, sucal signal 1/29 LP >> Glucose 82, protein >600, WBC 600>885, eosinophils 28% 1/29 Echo >> LV EF 60-65%, indeterminent diastolic function, wall motion normal 1/30 EEG >> diffuse slowing and background suppression, no seizures   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 1/24 Cryptococcal [name]): negative 1/24 CSF [facility]): NGTD 1/22 scrotal wound cx [name]): MRSA Toxoplasma [name]): Negative 1/25 BCx x2 : NGTD 1/25 HCV ab + 1/24 Trach asp 1/24: NGTD 1/24 MRSA PCR: MRSA pos 1/25 HIV quant: 80, CD4 10 1/25 CSF AFB: Smear neg, >> 1/25 CSF fungal cx: NGTD 1/25 CSF crypto ag: neg 1/25 CSF gram stain no organisms, cx: NGTD 1/27 strongyloides ab >> negative 1/27 coccidiomycosis ab >> 1/28 BAL AFB smear/cx> 1/28 BAL Fungus cx >> 1/28 BAL gram stain/Cx: 40K ESBL Klebsiella pneumoniae 1/28 BAL pneumocystis smear: Neg 1/27 O&P 1/29 CSF Cx >> no organisms on gram stain, >> NGTD   ANTIBIOTICS: Rocephin 1/24>>1/30 Vancomycin 1/24>>1/30; 2/2 >  Acyclovir 1/24>> 1/26, 1/27>> Ampicillin 1/24>> 1/27 Bactrim M/W/F for PCP prophylaxis Meropenem 1/30>> 2/1; 2/2 >  Pen G 2/1 >> 2/2 Cipro 2/1 >> 2/2  SIGNIFICANT EVENTS: 1/24> Transferred from [facility] 1/25> Underwent LP here  LINES/TUBES: L IJ CVL 1/24 >> ETT 1/24 >> Foley 1/24 >> OGT 1/24 >>  DISCUSSION: 51 year old man with HIV/AIDS (first diagnosed 2003, last CD4 count 245 with viremia of 1,160 in October 2017; on Genovya, Darunavir and Bactrim), Genital Warts, COPD, and cocaine substance abuse who presented to  [facility] on 1/24 with altered mental status. Intubated for airway protection.   ASSESSMENT / PLAN:  PULMONARY A: Acute Hypoxemic Respiratory Failure 2/2 HCAP ESBL Klebsiella LLL+ sepsis + Unable to protect airway + pulm edema P:   Continue full vent support. PST as tolerated.  Davy pt.  Mental status and agitation are barriers to extubation as well.  Daily wake up assessment Antibiotics as per ID section  CARDIOVASCULAR A:  Sinus Tachycardia Incomplete RBBB HTN P:  Hydralazine prn  RENAL A:   Pulm edema P:   Follow labs, urine output, creatinine Lasix 83m IV BID >> observe renal fxn with diuresis and acyclovir.   GASTROINTESTINAL A:   Transaminitis, (worsened by seroquel likely which was discontinued on 1/31) HCV ab+, Chronic hep b P:   Continue tube feeds Follow LFTs Famotidine 20 mg IV BID  HEMATOLOGIC A:   Mild leukocytosis > Resolved Mild anemia P:  Sub Q heparin for VTE ppx  INFECTIOUS A:   Meningitis, bacterial + viral. 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 Abx switched to Rocephin and vanc added on 2/2. Cont acyclovir Toxoplasma neg Continue antiretrovirals per ID Bactrim on hold >> will ask ID re: PCP prophylaxis.   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 Will consider decreasing clonazepam if he does not wake up.  RASS goal: 0 to -1 ASA daily Neuro following. MRA head and neck U/R. Plan for MRI of  thoiracic/cervical/lumbar areas.  Has Thorazine prn for hiccups  FAMILY  - Updates: Mother updated via the phone on 2/2 and discussed with her over all prognosis.  She wants everything done.  - Inter-disciplinary family meet or Palliative Care meeting due by:  2/7   I spent  30  minutes of Critical Care time with this patient today.    Monica Becton, MD 12/11/2016, 9:49 AM [place] [facility] Pager [phone] After 3 pm or if no answer, call [phone]

## 2016-12-12 ENCOUNTER — Inpatient Hospital Stay (HOSPITAL_COMMUNITY): Payer: Medicaid Other

## 2016-12-12 DIAGNOSIS — G0491 Myelitis, unspecified: Secondary | ICD-10-CM

## 2016-12-12 DIAGNOSIS — I6340 Cerebral infarction due to embolism of unspecified cerebral artery: Secondary | ICD-10-CM

## 2016-12-12 DIAGNOSIS — G8220 Paraplegia, unspecified: Secondary | ICD-10-CM

## 2016-12-12 DIAGNOSIS — Z9911 Dependence on respirator [ventilator] status: Secondary | ICD-10-CM

## 2016-12-12 DIAGNOSIS — K759 Inflammatory liver disease, unspecified: Secondary | ICD-10-CM

## 2016-12-12 DIAGNOSIS — Z1612 Extended spectrum beta lactamase (ESBL) resistance: Secondary | ICD-10-CM

## 2016-12-12 DIAGNOSIS — M5410 Radiculopathy, site unspecified: Secondary | ICD-10-CM

## 2016-12-12 DIAGNOSIS — R7881 Bacteremia: Secondary | ICD-10-CM

## 2016-12-12 DIAGNOSIS — I638 Other cerebral infarction: Secondary | ICD-10-CM

## 2016-12-12 LAB — FUNGAL ANTIBODIES PANEL, ID-BLOOD
ASPERGILLUS NIGER: NEGATIVE
Aspergillus flavus: NEGATIVE
Aspergillus fumigatus, IgG: NEGATIVE
Blastomyces Abs, Qn, DID: NEGATIVE
Histoplasma Ab, Immunodiffusion: NEGATIVE

## 2016-12-12 LAB — GLUCOSE, CAPILLARY
GLUCOSE-CAPILLARY: 123 mg/dL — AB (ref 65–99)
GLUCOSE-CAPILLARY: 216 mg/dL — AB (ref 65–99)
GLUCOSE-CAPILLARY: 238 mg/dL — AB (ref 65–99)
GLUCOSE-CAPILLARY: 300 mg/dL — AB (ref 65–99)
Glucose-Capillary: 200 mg/dL — ABNORMAL HIGH (ref 65–99)
Glucose-Capillary: 201 mg/dL — ABNORMAL HIGH (ref 65–99)
Glucose-Capillary: 245 mg/dL — ABNORMAL HIGH (ref 65–99)

## 2016-12-12 LAB — COMPREHENSIVE METABOLIC PANEL
ALBUMIN: 1.8 g/dL — AB (ref 3.5–5.0)
ALK PHOS: 101 U/L (ref 38–126)
ALT: 350 U/L — AB (ref 17–63)
AST: 197 U/L — AB (ref 15–41)
Anion gap: 9 (ref 5–15)
BILIRUBIN TOTAL: 0.9 mg/dL (ref 0.3–1.2)
BUN: 23 mg/dL — AB (ref 6–20)
CALCIUM: 8.1 mg/dL — AB (ref 8.9–10.3)
CO2: 34 mmol/L — ABNORMAL HIGH (ref 22–32)
CREATININE: 0.83 mg/dL (ref 0.61–1.24)
Chloride: 90 mmol/L — ABNORMAL LOW (ref 101–111)
GFR calc Af Amer: >60 mL/min (ref >60)
GLUCOSE: 245 mg/dL — AB (ref 65–99)
Potassium: 5.2 mmol/L — ABNORMAL HIGH (ref 3.5–5.1)
Sodium: 133 mmol/L — ABNORMAL LOW (ref 135–145)
TOTAL PROTEIN: 8.4 g/dL — AB (ref 6.5–8.1)

## 2016-12-12 LAB — CBC
HEMATOCRIT: 29 % — AB (ref 39.0–52.0)
HEMOGLOBIN: 9 g/dL — AB (ref 13.0–17.0)
MCH: 30.1 pg (ref 26.0–34.0)
MCHC: 31 g/dL (ref 30.0–36.0)
MCV: 97 fL (ref 78.0–100.0)
Platelets: 356 10*3/uL (ref 150–400)
RBC: 2.99 MIL/uL — ABNORMAL LOW (ref 4.22–5.81)
RDW: 17.3 % — ABNORMAL HIGH (ref 11.5–15.5)
WBC: 12.3 10*3/uL — AB (ref 4.0–10.5)

## 2016-12-12 LAB — PHOSPHORUS: Phosphorus: 3.2 mg/dL (ref 2.5–4.6)

## 2016-12-12 LAB — TRIGLYCERIDES: TRIGLYCERIDES: 217 mg/dL — AB (ref <150)

## 2016-12-12 LAB — MAGNESIUM: Magnesium: 1.7 mg/dL (ref 1.7–2.4)

## 2016-12-12 LAB — VANCOMYCIN, TROUGH: Vancomycin Tr: 21 ug/mL (ref 15–20)

## 2016-12-12 MED ORDER — SODIUM CHLORIDE 0.9 % IV SOLN
Freq: Three times a day (TID) | INTRAVENOUS | Status: AC
  Administered 2016-12-12 – 2016-12-24 (×36): via INTRAVENOUS
  Filled 2016-12-12 (×45): qty 20

## 2016-12-12 MED ORDER — INSULIN GLARGINE 100 UNIT/ML ~~LOC~~ SOLN
30.0000 [IU] | SUBCUTANEOUS | Status: DC
  Administered 2016-12-13 – 2016-12-15 (×3): 30 [IU] via SUBCUTANEOUS
  Filled 2016-12-12 (×3): qty 0.3

## 2016-12-12 MED ORDER — FUROSEMIDE 10 MG/ML IJ SOLN
20.0000 mg | Freq: Two times a day (BID) | INTRAMUSCULAR | Status: DC
  Administered 2016-12-12 – 2016-12-17 (×10): 20 mg via INTRAVENOUS
  Filled 2016-12-12 (×12): qty 2

## 2016-12-12 NOTE — Progress Notes (Signed)
Pt's Vanc Through 21 (lab result) were called to Dr. Dellie CatholicSommers, as well as pharmacy notified. Both aware. Will hold the Vanc 1700 at this time.

## 2016-12-12 NOTE — Progress Notes (Addendum)
INFECTIOUS DISEASE PROGRESS NOTE  ID: Lawrence Kane is a 51 y.o. male with  Active Problems:   COPD (chronic obstructive pulmonary disease) ([place])   Substance abuse   Genital warts   HIV (human immunodeficiency virus infection) ([place])   AIDS (acquired immune deficiency syndrome) ([place])   Noncompliance   Bacterial meningitis   Hypertensive urgency   Septic shock ([place])   Transaminitis   Cocaine abuse   Open wound   Acute respiratory failure ([place])   Altered mental status   Acute encephalopathy   Cerebral embolism with cerebral infarction   Endotracheally intubated   Meningoencephalitis   Neurosyphilis   Herpesviral meningitis   Paraplegia ([place])   Acute pulmonary edema ([facility])  Subjective: Sedated on vent  Abtx:  Anti-infectives    Start     Dose/Rate Route Frequency Ordered Stop   12/10/16 1400  meropenem (MERREM) 2 g in sodium chloride 0.9 % 100 mL IVPB     2 g 200 mL/hr over 30 Minutes Intravenous Every 8 hours 12/10/16 1140     12/10/16 1300  vancomycin (VANCOCIN) 1,250 mg in sodium chloride 0.9 % 250 mL IVPB     1,250 mg 166.7 mL/hr over 90 Minutes Intravenous Every 8 hours 12/10/16 1140     12/09/16 1000  penicillin G potassium 4 Million Units in dextrose 5 % 250 mL IVPB  Status:  Discontinued     4 Million Units 250 mL/hr over 60 Minutes Intravenous Every 4 hours 12/09/16 0851 12/10/16 1109   12/09/16 1000  ciprofloxacin (CIPRO) IVPB 400 mg  Status:  Discontinued     400 mg 200 mL/hr over 60 Minutes Intravenous Every 12 hours 12/09/16 0929 12/10/16 1109   12/07/16 2245  meropenem (MERREM) 2 g in sodium chloride 0.9 % 100 mL IVPB  Status:  Discontinued     2 g 200 mL/hr over 30 Minutes Intravenous Every 8 hours 12/07/16 2236 12/09/16 0851   12/04/16 2200  vancomycin (VANCOCIN) 1,250 mg in sodium chloride 0.9 % 250 mL IVPB  Status:  Discontinued     1,250 mg 166.7 mL/hr over 90 Minutes Intravenous Every 8 hours 12/04/16 1421 12/07/16 1057   12/04/16 1000   acyclovir (ZOVIRAX) 1,000 mg in dextrose 5 % 150 mL IVPB     1,000 mg 170 mL/hr over 60 Minutes Intravenous Every 8 hours 12/04/16 0943     12/03/16 0900  acyclovir (ZOVIRAX) 1,080 mg in dextrose 5 % 250 mL IVPB  Status:  Discontinued     1,080 mg 271.6 mL/hr over 60 Minutes Intravenous Every 8 hours 12/03/16 0651 12/03/16 0843   12/03/16 0900  acyclovir (ZOVIRAX) 1,000 mg in dextrose 5 % 150 mL IVPB  Status:  Discontinued     1,000 mg 170 mL/hr over 60 Minutes Intravenous Every 8 hours 12/03/16 0843 12/03/16 1002   12/02/16 1015  darunavir-cobicistat (PREZCOBIX) 800-150 MG per tablet 1 tablet     1 tablet Oral Daily with breakfast 12/02/16 1003     12/02/16 1000  emtricitabine-tenofovir AF (DESCOVY) 200-25 MG per tablet 1 tablet     1 tablet Per Tube Daily 12/01/16 2320     12/02/16 1000  dolutegravir (TIVICAY) tablet 50 mg     50 mg Oral Daily 12/01/16 2320     12/02/16 1000  vancomycin (VANCOCIN) 1,250 mg in sodium chloride 0.9 % 250 mL IVPB  Status:  Discontinued     1,250 mg 166.7 mL/hr over 90 Minutes Intravenous Every 24 hours 12/02/16  0010 12/02/16 0634   12/02/16 0900  acyclovir (ZOVIRAX) 930 mg in dextrose 5 % 150 mL IVPB  Status:  Discontinued     10 mg/kg  93 kg 168.6 mL/hr over 60 Minutes Intravenous Every 8 hours 12/02/16 0632 12/03/16 0651   12/02/16 0700  vancomycin (VANCOCIN) IVPB 1000 mg/200 mL premix  Status:  Discontinued     1,000 mg 200 mL/hr over 60 Minutes Intravenous Every 8 hours 12/02/16 0634 12/04/16 1421   12/02/16 0400  cefTRIAXone (ROCEPHIN) 2 g in dextrose 5 % 50 mL IVPB  Status:  Discontinued     2 g 100 mL/hr over 30 Minutes Intravenous Every 12 hours 12/02/16 0010 12/07/16 2222   12/02/16 0100  acyclovir (ZOVIRAX) 1,110 mg in dextrose 5 % 250 mL IVPB  Status:  Discontinued     10 mg/kg  111.1 kg 272.2 mL/hr over 60 Minutes Intravenous Every 8 hours 12/02/16 0010 12/02/16 0632   12/02/16 0100  sulfamethoxazole-trimethoprim (BACTRIM,SEPTRA) 200-40  MG/5ML suspension 20 mL  Status:  Discontinued     20 mL Per Tube Once per day on Mon Wed Fri 12/02/16 0010 12/06/16 1416   12/02/16 0000  ampicillin (OMNIPEN) 2 g in sodium chloride 0.9 % 50 mL IVPB  Status:  Discontinued     2 g 150 mL/hr over 20 Minutes Intravenous Every 4 hours 12/01/16 2322 12/04/16 0941      Medications:  Scheduled: . acyclovir  1,000 mg Intravenous Q8H  . aspirin  325 mg Per Tube Daily  . chlorhexidine gluconate (MEDLINE KIT)  15 mL Mouth Rinse BID  . clonazepam  2 mg Per Tube TID  . darunavir-cobicistat  1 tablet Oral Q breakfast  . dolutegravir  50 mg Oral Daily  . emtricitabine-tenofovir AF  1 tablet Per Tube Daily  . famotidine  20 mg Per Tube BID  . fentaNYL (SUBLIMAZE) injection  50 mcg Intravenous Once  . furosemide  40 mg Intravenous Q12H  . heparin  5,000 Units Subcutaneous Q8H  . insulin aspart  0-20 Units Subcutaneous Q4H  . insulin aspart  4 Units Subcutaneous Q4H  . insulin glargine  15 Units Subcutaneous Q24H  . mouth rinse  15 mL Mouth Rinse 10 times per day  . meropenem (MERREM) IV  2 g Intravenous Q8H  . polyethylene glycol  17 g Per Tube Daily  . vancomycin  1,250 mg Intravenous Q8H    Objective: Vital signs in last 24 hours: Temp:  [97.6 F (36.4 C)-98.9 F (37.2 C)] 98.9 F (37.2 C) (02/04 0833) Pulse Rate:  [57-117] 60 (02/04 0845) Resp:  [13-25] 17 (02/04 0845) BP: (109-152)/(53-89) 128/72 (02/04 0845) SpO2:  [90 %-100 %] 98 % (02/04 0845) FiO2 (%):  [30 %] 30 % (02/04 0845) Weight:  [103.3 kg (227 lb 11.8 oz)] 103.3 kg (227 lb 11.8 oz) (02/04 0500)   General appearance: no distress Resp: clear to auscultation bilaterally Cardio: regular rate and rhythm GI: abnormal findings:  hypoactive bowel sounds  Lab Results  Recent Labs  12/11/16 0330 12/12/16 0319  WBC 9.6 12.3*  HGB 8.5* 9.0*  HCT 27.2* 29.0*  NA 133* 133*  K 4.5 5.2*  CL 93* 90*  CO2 33* 34*  BUN 25* 23*  CREATININE 0.97 0.83   Liver  Panel  Recent Labs  12/11/16 0330 12/12/16 0319  PROT 7.4 8.4*  ALBUMIN 1.6* 1.8*  AST 322* 197*  ALT 391* 350*  ALKPHOS 94 101  BILITOT 0.9 0.9   Sedimentation Rate  No results for input(s): ESRSEDRATE in the last 72 hours. C-Reactive Protein No results for input(s): CRP in the last 72 hours.  Microbiology: Recent Results (from the past 240 hour(s))  CSF culture     Status: None   Collection Time: 12/02/16  2:24 PM  Result Value Ref Range Status   Specimen Description CSF  Final   Special Requests NONE  Final   Gram Stain   Final    CYTOSPIN SMEAR WBC PRESENT,BOTH PMN AND MONONUCLEAR NO ORGANISMS SEEN    Culture NO GROWTH 3 DAYS  Final   Report Status 12/06/2016 FINAL  Final  Culture, fungus without smear     Status: None (Preliminary result)   Collection Time: 12/02/16  2:25 PM  Result Value Ref Range Status   Specimen Description CSF  Final   Special Requests NONE  Final   Culture   Final    NO FUNGUS ISOLATED AFTER 7 DAYS CONTINUING TO HOLD FOR 6 WEEKS PER PHYSICIAN REQUEST   Report Status PENDING  Incomplete  Culture, bal-quantitative     Status: Abnormal   Collection Time: 12/05/16  9:49 AM  Result Value Ref Range Status   Specimen Description BRONCHIAL ALVEOLAR LAVAGE  Final   Special Requests NONE  Final   Gram Stain NO WBC SEEN NO ORGANISMS SEEN  Final   Culture (A)  Final    40,000 COLONIES/mL KLEBSIELLA PNEUMONIAE Confirmed Extended Spectrum Beta-Lactamase Producer (ESBL)    Report Status 12/07/2016 FINAL  Final   Organism ID, Bacteria KLEBSIELLA PNEUMONIAE (A)  Final      Susceptibility   Klebsiella pneumoniae - MIC*    AMPICILLIN >=32 RESISTANT Resistant     CEFAZOLIN >=64 RESISTANT Resistant     CEFEPIME 2 RESISTANT Resistant     CEFTAZIDIME 4 RESISTANT Resistant     CEFTRIAXONE >=64 RESISTANT Resistant     CIPROFLOXACIN 1 SENSITIVE Sensitive     GENTAMICIN >=16 RESISTANT Resistant     IMIPENEM <=0.25 SENSITIVE Sensitive     TRIMETH/SULFA  >=320 RESISTANT Resistant     AMPICILLIN/SULBACTAM >=32 RESISTANT Resistant     PIP/TAZO 16 SENSITIVE Sensitive     Extended ESBL POSITIVE Resistant     * 40,000 COLONIES/mL KLEBSIELLA PNEUMONIAE  Pneumocystis smear by DFA     Status: None   Collection Time: 12/05/16  9:49 AM  Result Value Ref Range Status   Specimen Source-PJSRC BRONCHIAL ALVEOLAR LAVAGE  Final   Pneumocystis jiroveci Ag NEGATIVE  Final    Comment: Performed at [facility]  Acid Fast Smear (AFB)     Status: None   Collection Time: 12/05/16  9:49 AM  Result Value Ref Range Status   AFB Specimen Processing Concentration  Final   Acid Fast Smear Negative  Final    Comment: (NOTE) Performed At: [facility] [address] [place], [place] [id number] Lindon Romp MD [id number]    Source (AFB) BRONCHIAL ALVEOLAR LAVAGE  Final  Fungus Culture With Stain     Status: None (Preliminary result)   Collection Time: 12/05/16  9:49 AM  Result Value Ref Range Status   Fungus Stain Final report  Final    Comment: (NOTE) Performed At: [facility] [place], [place] [id number] Lindon Romp MD YI:[id number]    Fungus (Mycology) Culture PENDING  Incomplete   Fungal Source BRONCHIAL ALVEOLAR LAVAGE  Final  Fungus Culture Result     Status: None   Collection Time: 12/05/16  9:49 AM  Result Value Ref Range Status   Result 1 Comment  Final    Comment: (NOTE) KOH/Calcofluor preparation:  no fungus observed. Performed At: [facility] [place], [place] [id number] Lindon Romp MD QJ:[id number]   CSF culture     Status: None   Collection Time: 12/06/16  4:54 PM  Result Value Ref Range Status   Specimen Description CSF  Final   Special Requests NONE  Final   Gram Stain   Final    WBC PRESENT,BOTH PMN AND MONONUCLEAR NO ORGANISMS SEEN CYTOSPIN    Culture NO GROWTH 3 DAYS  Final   Report Status 12/09/2016 FINAL  Final  Culture, blood (routine x  2)     Status: None (Preliminary result)   Collection Time: 12/08/16  9:37 AM  Result Value Ref Range Status   Specimen Description BLOOD RIGHT ANTECUBITAL  Final   Special Requests BOTTLES DRAWN AEROBIC AND ANAEROBIC 5CC  Final   Culture NO GROWTH 3 DAYS  Final   Report Status PENDING  Incomplete  Culture, blood (routine x 2)     Status: None (Preliminary result)   Collection Time: 12/08/16  9:41 AM  Result Value Ref Range Status   Specimen Description BLOOD BLOOD RIGHT ARM  Final   Special Requests BOTTLES DRAWN AEROBIC AND ANAEROBIC 5CC  Final   Culture NO GROWTH 3 DAYS  Final   Report Status PENDING  Incomplete    Studies/Results: Mr Cervical Spine W Wo Contrast  Result Date: 12/11/2016 CLINICAL DATA:  Paraplegia. Evaluate for abscess or post LP epidural hematoma. HIV and cocaine use. EXAM: MRI TOTAL SPINE WITHOUT AND WITH CONTRAST TECHNIQUE: Multisequence MR imaging of the spine from the cervical spine to the sacrum was performed prior to and following IV contrast administration for evaluation of spinal metastatic disease. CONTRAST:  32m MULTIHANCE GADOBENATE DIMEGLUMINE 529 MG/ML IV SOLN COMPARISON:  None. FINDINGS: MRI CERVICAL SPINE FINDINGS Alignment: Physiologic. Vertebrae: No fracture, evidence of discitis, or bone lesion. Hypointense marrow usually from anemia. Cord: The cord at has normal signal and morphology, but there is diffuse superficial enhancement in which also extends along the bilateral nerve roots. 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 There is also folia enhancement and superficial brainstem enhancement compatible with meningitis. Intubation with fluid throughout the paranasal sinuses and airway above the cuff. Disc levels: No herniation or impingement. MRI THORACIC SPINE  FINDINGS Alignment:  Physiologic. Vertebrae: No fracture, evidence of discitis, or bone lesion. Hypointense marrow likely from anemia Cord: Predominately dorsal dural thickening throughout the thoracic canal. Diffuse cord signal abnormality with patchy appearance mainly along the posterior half of the peripheral cord. No notable swelling. Some of these areas enhance, including at in the T5 and T6 region. Direct infection or patchy infarct from perforators (expected the dorsal cord vessels would be greater involved in this patient who has been intubated and supine) Paraspinal and other soft tissues: Consolidation in both lower lobes. Disc levels: No herniation or impingement. MRI LUMBAR SPINE FINDINGS Segmentation:  Standard. Alignment:  Physiologic. Vertebrae: No fracture, evidence of discitis, or bone lesion. Hypointense marrow likely from anemia. Conus medullaris: Extends to the L1 level and appears normal. Paraspinal and other soft tissues: Negative Disc levels: Mild disc desiccation and bulging at L3-4 and L4-5. Findings are most likely infectious, both by imaging  and based on clinical circumstances. Neurosarcoid can have similar findings, but usually and a different clinical setting. There is no specific imaging feature to differentiate bacterial, viral, and helminth infection. IMPRESSION: 1. No abscess or hematoma as questioned clinically. 2. Generalized meningitis and radiculitis. 3. 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 4. Findings are nonspecific. In addition to the pathogens considered in the chart, would also check CMV. Neurosarcoid can have the same findings, but usually in a different clinical setting. Electronically Signed   By: Monte Fantasia M.D.   On: 12/11/2016 13:49   Mr Thoracic Spine W Wo Contrast  Result Date: 12/11/2016 CLINICAL DATA:  Paraplegia. Evaluate for abscess or post LP epidural hematoma. HIV and cocaine use. EXAM: MRI TOTAL SPINE  WITHOUT AND WITH CONTRAST TECHNIQUE: Multisequence MR imaging of the spine from the cervical spine to the sacrum was performed prior to and following IV contrast administration for evaluation of spinal metastatic disease. CONTRAST:  78m MULTIHANCE GADOBENATE DIMEGLUMINE 529 MG/ML IV SOLN COMPARISON:  None. FINDINGS: MRI CERVICAL SPINE FINDINGS Alignment: Physiologic. Vertebrae: No fracture, evidence of discitis, or bone lesion. Hypointense marrow usually from anemia. Cord: The cord at has normal signal and morphology, but there is diffuse superficial enhancement in which also extends along the bilateral nerve roots. 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 There is also folia enhancement and superficial brainstem enhancement compatible with meningitis. Intubation with fluid throughout the paranasal sinuses and airway above the cuff. Disc levels: No herniation or impingement. MRI THORACIC SPINE FINDINGS Alignment:  Physiologic. Vertebrae: No fracture, evidence of discitis, or bone lesion. Hypointense marrow likely from anemia Cord: Predominately dorsal dural thickening throughout the thoracic canal. Diffuse cord signal abnormality with patchy appearance mainly along the posterior half of the peripheral cord. No notable swelling. Some of these areas enhance, including at in the T5 and T6 region. Direct infection or patchy infarct from perforators (expected the dorsal cord vessels would be greater involved in this patient who has been intubated and supine) Paraspinal and other soft tissues: Consolidation in both lower lobes. Disc levels: No herniation or impingement. MRI LUMBAR SPINE FINDINGS Segmentation:  Standard. Alignment:  Physiologic. Vertebrae: No fracture, evidence of discitis, or bone lesion. Hypointense marrow  likely from anemia. Conus medullaris: Extends to the L1 level and appears normal. Paraspinal and other soft tissues: Negative Disc levels: Mild disc desiccation and bulging at L3-4 and L4-5. Findings are most likely infectious, both by imaging and based on clinical circumstances. Neurosarcoid can have similar findings, but usually and a different clinical setting. There is no specific imaging feature to differentiate bacterial, viral, and helminth infection. IMPRESSION: 1. No abscess or hematoma as questioned clinically. 2. Generalized meningitis and radiculitis. 3. 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 4. Findings are nonspecific. In addition to the pathogens considered in the chart, would also check CMV. Neurosarcoid can have the same findings, but usually in a different clinical setting. Electronically Signed   By: JMonte FantasiaM.D.   On: 12/11/2016 13:49   Mr Lumbar Spine W Wo Contrast  Result Date: 12/11/2016 CLINICAL DATA:  Paraplegia. Evaluate for abscess or post LP epidural hematoma. HIV and cocaine use. EXAM: MRI TOTAL SPINE WITHOUT AND WITH CONTRAST TECHNIQUE: Multisequence MR imaging of the spine from the cervical spine to the sacrum  was performed prior to and following IV contrast administration for evaluation of spinal metastatic disease. CONTRAST:  63m MULTIHANCE GADOBENATE DIMEGLUMINE 529 MG/ML IV SOLN COMPARISON:  None. FINDINGS: MRI CERVICAL SPINE FINDINGS Alignment: Physiologic. Vertebrae: No fracture, evidence of discitis, or bone lesion. Hypointense marrow usually from anemia. Cord: The cord at has normal signal and morphology, but there is diffuse superficial enhancement in which also extends along the bilateral nerve roots. Posterior Fossa, vertebral arteries, paraspinal tissues: On sagittal postcontrast imaging there is superficial enhancement in the left more than right inferior cerebellum which correlates with early subacute infarcts  seen on prior brain MRI. There is also folia enhancement and superficial brainstem enhancement compatible with meningitis. Intubation with fluid throughout the paranasal sinuses and airway above the cuff. Disc levels: No herniation or impingement. MRI THORACIC SPINE FINDINGS Alignment:  Physiologic. Vertebrae: No fracture, evidence of discitis, or bone lesion. Hypointense marrow likely from anemia Cord: Predominately dorsal dural thickening throughout the thoracic canal. Diffuse cord signal abnormality with patchy appearance mainly along the posterior half of the peripheral cord. No notable swelling. Some of these areas enhance, including at in the T5 and T6 region. Direct infection or patchy infarct from perforators (expected the dorsal cord vessels would be greater involved in this patient who has been intubated and supine) Paraspinal and other soft tissues: Consolidation in both lower lobes. Disc levels: No herniation or impingement. MRI LUMBAR SPINE FINDINGS Segmentation:  Standard. Alignment:  Physiologic. Vertebrae: No fracture, evidence of discitis, or bone lesion. Hypointense marrow likely from anemia. Conus medullaris: Extends to the L1 level and appears normal. Paraspinal and other soft tissues: Negative Disc levels: Mild disc desiccation and bulging at L3-4 and L4-5. Findings are most likely infectious, both by imaging and based on clinical circumstances. Neurosarcoid can have similar findings, but usually and a different clinical setting. There is no specific imaging feature to differentiate bacterial, viral, and helminth infection. IMPRESSION: 1. No abscess or hematoma as questioned clinically. 2. Generalized meningitis and radiculitis. 3. 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 4. Findings are nonspecific. In addition to the pathogens considered in the chart, would also check CMV. Neurosarcoid can have the same findings, but usually in a different  clinical setting. Electronically Signed   By: JMonte FantasiaM.D.   On: 12/11/2016 13:49   Dg Chest Port 1 View  Result Date: 12/12/2016 CLINICAL DATA:  51year old male with HIV and suspected disseminated CNS infection. Respiratory failure. Initial encounter. EXAM: PORTABLE CHEST 1 VIEW COMPARISON:  12/09/2016 and earlier. FINDINGS: Portable AP semi upright view at 0443 hours. Endotracheal tube tip between the level the clavicles and carina. Enteric tube courses to the abdomen and side hole projects at the level of the gastric body. Stable left IJ central line, tip projects at the subclavian and jugular venous confluence. Stable lung volumes. Stable cardiac size and mediastinal contours. Continued dense retrocardiac opacity obscuring the left hemidiaphragm. Mild superimposed bilateral perihilar reticulonodular opacity. No pneumothorax or pulmonary edema. Ventilation has not significantly changed since 12/08/2016. IMPRESSION: 1. Stable lines and tubes. Note that the left IJ central line tip is at the confluence of the left subclavian and jugular veins. 2. Since 12/08/2016 there is Left lower lobe collapse or consolidation with superimposed bilateral perihilar reticulonodular opacity. Favor bilateral pneumonia. Electronically Signed   By: HGenevie AnnM.D.   On: 12/12/2016 07:05     Assessment/Plan: AIDS (Darunavir/c- [place]) Meningoencephalitis Bilateral punctate infarcts Paraplegia Myelitis, Radiculuitis HSV2+ CSF ESBL K  pneumo in BAL Hepatitis   Total days of antibiotics: 11 (vanco, merrem), ACV day 11  Will add CMV PCR to CSF No change for now.  Hold on adding valcyte at this point.  LFTs better         Bobby Rumpf Infectious Diseases (pager) [phone] [email or web address] 12/12/2016, 11:18 AM  LOS: 11 days

## 2016-12-12 NOTE — Progress Notes (Signed)
Pharmacy Antibiotic Note  Lawrence Kane is a 51 y.o. 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 Per ID, would like to also cover for MRSA.  Pharmacy has been consulted for vancomycin and meropenem. Patient with negative RPR so PCN was discontinued.  A measured Vancomycin trough this evening resulted as 21 mcg/ml however was drawn ~6.5 hours after the mid-morning dose. The patient's corrected Vancomycin trough is estimated to be closer to ~18.5 mcg/ml. SCr 0.83, CrCl~100 ml/min. Will continue with the current dose.   Plan: - Continue Vancomycin 1250 mg IV q8h - Continue Meropenem 2g IV q8h - Continue Acyclovir 1000 mg IV q8h - Monitor cultures, renal function and clinical progression  Height: _0  (193 cm) Weight: 227 lb 11.8 oz (103.3 kg) IBW/kg (Calculated) : 86.8  Temp (24hrs), Avg:98.7 F (37.1 C), Min:97.6 F (36.4 C), Max:99.7 F (37.6 C)   Recent Labs Lab 12/07/16 0540 12/08/16 0421 12/08/16 1629 12/09/16 0306 12/10/16 0424 12/11/16 0330 12/12/16 0319 12/12/16 1630  WBC 7.9 5.7  --  7.6 10.1 9.6 12.3*  --   CREATININE 1.05 1.03 1.20 1.11 1.19 0.97 0.83  --   VANCOTROUGH 19  --   --   --   --   --   --  21*    Estimated Creatinine Clearance: 129.3 mL/min (by C-G formula based on SCr of 0.83 mg/dL).    Allergies  Allergen Reactions  . Iodine   . Ketorolac     unknown  . Tylenol [Acetaminophen]     unknown    Antimicrobials this admission: Ceftriaxone 1/24 (started at OSH) >> 1/30 Vancomycin 1/24 (started at OSH) >> 1/30 Ampicillin 1/25 >>1/27 Acyclovir 1/25 >>1/26; 1/27>> Bactrim 1/25 (PCP prophylaxis) >> 1/29 Meropenem 1/30 >> 2/1  2/2 >> Ciprofloxacin 2/1 >> 2/2 PCN 2/1 >>2/2  Dose adjustments this admission: VT - 12 1/27 on 1 g q8h VT - 19 1/30 on 1234m q8h: no changes  Microbiology results: 1/22 scrotal wound MRSA 1/24 BCx x 2 ([name]): 1/24 HSV PCR ([name]: HSV2 POS 1/24  Cryptococcal ([name]: 1/24 CSF ([facility]: gram stain no organism  1/24 Trach asp: ngtd 1/24 MRSA PCR: positive 1/25 BCx x2: ngtd 1/25 cryptococcal: negative 1/25 CSF culture (post abx): ngtd 1/25 CSF fungal Cx: neg 1/25 CSF AFB: neg 1/25 Toxoplasma: neg 1/27 stronglyoids ab: negative 1/28 fungus from bronch: none observed 1/28 AFB bronch: neg 1/28 PCP bronch: neg 1/28 BAL: 40,000 klebsiella pneumonia ESBL - no need to treat per ID. no parasites 1/29 CSF: ngtd 1/31 BCx: ng<12h 2/1 RPR: Negative  Thank you for allowing pharmacy to be a part of this patient's care.  TDimitri Ped PharmD, BCPS PGY-2 Infectious Diseases Pharmacy Resident Pager: [phone]/02/2017 6:51 PM

## 2016-12-12 NOTE — Progress Notes (Signed)
Pharmacy called. Can hang Vanc 1700 dose.

## 2016-12-12 NOTE — Progress Notes (Signed)
Family called and brother stated: "I demand the doctor, or who ever call me right now and updated us about the MRI and situation" It was explained to the family that doctor on call can call them with results, but it is not pt's primary doctor. Apology was given. The upcoming nurse will be communicated the wish of the family and to make sure that in the morning primary MD will call and update them with the plan of care. Box was called and notified about the situation.

## 2016-12-12 NOTE — Progress Notes (Signed)
MD please call with updates: Lawrence DillKaren Desilus (sister0 [phone]

## 2016-12-12 NOTE — Progress Notes (Signed)
STROKE TEAM PROGRESS NOTE   SUBJECTIVE (INTERVAL HISTORY) No family at bedside. Exam on propofol.  Spoke with RN who reports that patient will follow some simple commands in the UEs when off propofol but not in the legs.  Apparently patient becomes agitated on the vent and then requires addition propofol and/ fentanyl   OBJECTIVE Temp:  [97.6 F (36.4 C)-99.2 F (37.3 C)] 99.2 F (37.3 C) (02/04 1221) Pulse Rate:  [57-76] 73 (02/04 1300) Cardiac Rhythm: Normal sinus rhythm (02/04 0800) Resp:  [16-25] 17 (02/04 1300) BP: (109-152)/(53-89) 124/62 (02/04 1300) SpO2:  [90 %-100 %] 98 % (02/04 1300) FiO2 (%):  [30 %] 30 % (02/04 1155) Weight:  [103.3 kg (227 lb 11.8 oz)] 103.3 kg (227 lb 11.8 oz) (02/04 0500)  CBC:   Recent Labs Lab 12/11/16 0330 12/12/16 0319  WBC 9.6 12.3*  HGB 8.5* 9.0*  HCT 27.2* 29.0*  MCV 97.5 97.0  PLT 286 356    Basic Metabolic Panel:   Recent Labs Lab 12/11/16 0330 12/12/16 0319  NA 133* 133*  K 4.5 5.2*  CL 93* 90*  CO2 33* 34*  GLUCOSE 211* 245*  BUN 25* 23*  CREATININE 0.97 0.83  CALCIUM 8.2* 8.1*  MG 1.9 1.7  PHOS 3.6 3.2    Lipid Panel:     Component Value Date/Time   CHOL 141 12/09/2016 0310   TRIG 217 (H) 12/12/2016 0319   HDL 16 (L) 12/09/2016 0310   CHOLHDL 8.8 12/09/2016 0310   VLDL 32 12/09/2016 0310   LDLCALC 93 12/09/2016 0310   HgbA1c:  Lab Results  Component Value Date   HGBA1C 6.6 (H) 12/03/2016   Urine Drug Screen: No results found for: LABOPIA, COCAINSCRNUR, LABBENZ, AMPHETMU, THCU, LABBARB    IMAGING I have personally reviewed the radiological images below and agree with the radiology interpretations.  Ct Head Wo Contrast 12/02/2016 1. Negative for bleed or other acute intracranial process. 2. Mild bilateral maxillary and sphenoid sinus disease.   Mr Lodema Pilot Contrast 12/04/2016 1. 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 This pattern could be related to vasospasm from the patient's  meningitis, or from central/cardiac embolic disease. 2. Sulcal signal could be meningitis debris or artifact from elevated FiO2. Negative for intracranial collection or intraventricular debris. 3. History of HSV positive CSF PCR. No typical temporal/limbic encephalitis pattern. 4. Generalized sinusitis and mastoiditis.   2-D echocardiogram - Left ventricle: The cavity size was normal. Wall thickness was normal. Systolic function was normal. The estimated ejection fraction was in the range of 60% to 65%. Wall motion was normal; there were no regional wall motion abnormalities. Indeterminant diastolic function. - Aortic valve: There was no stenosis. - Mitral valve: There was no significant regurgitation. Valve area by pressure half-time: 2.37 cm^2. - Right ventricle: The cavity size was mildly dilated. Systolic function was normal. - Right atrium: The atrium was mildly dilated. - Tricuspid valve: Peak RV-RA gradient (S): 25 mm Hg. - Pulmonary arteries: PA peak pressure: 40 mm Hg (S). - Systemic veins: IVC measured 3.1 cm with < 50% respirophasic variation, suggesting RA pressure 15 mmHg. Impressions:   Normal LV size with EF 60-65%. Mildly dilated RV with normal systolic function. Mildly dilated RA. Mild pulmonary hypertension.  EEG This sedated EEG is abnormal due to diffuse slowing and suppression of the background. Clinical Correlation of the above findings indicates diffuse cerebral dysfunction that is non-specific in etiology but likely secondary to pharmacologic effect.  However, such findings  can be seen with hypoxic/ischemic injury, toxic/metabolic encephalopathies, or neurodegenerative disorders.  If further evaluation is required, consider repeating the EEG off of sedation.   Mr Maxine Glenn Head Wo Contrast 12/09/2016 IMPRESSION: MRA HEAD: Irregular RIGHT internal carotid artery, vessel remains patent. No emergent large vessel occlusion or severe stenosis. No intracranial vasculopathy by MRA though  catheter angiography is more sensitive. MRA NECK: Limited due to delayed phase. Beaded appearance of the RIGHT cervical internal carotid artery most compatible with fibromuscular dysplasia though focal HIV vasculopathy is possible.   MRI C/T/L-spine with and without contrast 12/11/2016 IMPRESSION: 1. No abscess or hematoma as questioned clinically. 2. Generalized meningitis and radiculitis. 3. 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 4. Findings are nonspecific. In addition to the pathogens considered in the chart, would also check CMV. Neurosarcoid can have the same findings, but usually in a different clinical setting.  PHYSICAL EXAM  Temp:  [97.6 F (36.4 C)-99.2 F (37.3 C)] 99.2 F (37.3 C) (02/04 1221) Pulse Rate:  [57-76] 73 (02/04 1300) Resp:  [16-25] 17 (02/04 1300) BP: (109-152)/(53-89) 124/62 (02/04 1300) SpO2:  [90 %-100 %] 98 % (02/04 1300) FiO2 (%):  [30 %] 30 % (02/04 1155) Weight:  [103.3 kg (227 lb 11.8 oz)] 103.3 kg (227 lb 11.8 oz) (02/04 0500)  General - Well nourished, well developed, intubated and on sedation.  Cardiovascular - Regular rate and rhythm. Pulmonary:  CTA Abdomen:  Soft ND normal bowel sounds  Neuro - intubated on sedation Does not open eyes to voice Did not follow commands Pupils 2.68mm, reactive to light, facial symmetric to grimace. Eyes slight upward position but able to cross midline with OCR testing.  Left corneal greater than right  Slightly withdraws to very deep stimuli BUE 3/5 strength.  No response in the lower extremmities Muscle tone decreased BLEs.  Coordination and gait not tested.    ASSESSMENT/PLAN Lawrence Kane is a 51 y.o. 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 CSF positive for HSV. Concern for cardioembolic versus  vasculitic source of embolic stroke. He did not receive IV t-PA due to unknown last known well.   Meningoencephalitis  Etiology unclear  CSF elevated WBC, RBC, protein. 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 Endocarditis is in the DDx but BCx and CSF culture so far all negative. Infection related hypercoagulable state also in DDx, but less likely given prominent intracranial processes.  Resultant  intubation, paraplegia  CT head no acute abnormality. Mild bilateral maxillary and sphenoid sinus disease   MRI  bilateral cerebellum and cerebral cortex punctate infarcts. Sulcal signal.   MRA HEAD and neck right ICA FMD pattern  2D Echo  EF 60-65%  EEG diffuse slowing in suppression of background  Agree with ID for TEE once stabilized to rule out endocarditis  LDL  93  HgbA1c 6.6  Heparin 5000 units sq tid for VTE prophylaxis Diet NPO time specified  No antithrombotic prior to admission, now on aspirin 325 mg daily. Continue ASA.   Ongoing aggressive stroke risk factor management  Therapy recommendations:  Order when appropriate   Disposition:  pending   HIV / AIDS  [facility]  On HAART as outpt  08/2016 CD4 245  On admission CD4 = 10  ID on board  Hyperlipidemia  LDL 93, goal < 70  Not statin candidate due to elevated AST/ALT  Tobacco abuse  Current smoker  Smoking  cessation counseling will be provided  Other Stroke Risk Factors  ETOH use   History of cocaine abuse  Other Active Problems  Transaminitis, HCV+, chronic Hep B, reflexive hepatitis C  Persistent eosinophilia CSF   Left lower lobe pneumonia - ESBL Klebsiella   History of personality disorder   S/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 Documentation reflects findings. The laboratory and radiographic studies reviewed by me. ROS:  pertinent positives could not be fully documented due to LOC  Condition:  critical  Assessment and plan completed by me personally and fully documented above. Plans/Recommendations include:     ID following closely.  CMV added to CSF PCR  Leg weakness likely multifactorial including the findings of thoracic myelitis versus strokee.  Management at outcome dependent on patient's response to therapy for meningocephalitis  There are no further recommendations at this time  SIGNED BY: Dr. Sula Sodahere Kearra Calkin    In addition to the cerebral embolic disease, patient also has multifocal dorsal thoracic cord signal abnormality from myelitis or potentially infarct.  These in addition to other co-morbidities could explain some of the neurologic findings.  This patient is critically ill due to meningoencephalitis, paraplegia, HIV, embolic stroke, coma, sepsis and at significant risk of neurological worsening, death form neuro worsening, recurrent stroke, septic shock, brain herniation, DIC and brain death. This patient's care requires constant monitoring of vital signs, hemodynamics, respiratory and cardiac monitoring, review of multiple databases, neurological assessment, discussion with family, other specialists and medical decision making of high complexity. I spent 40 minutes of neurocritical care time in the care of this  patient.  To contact Stroke Continuity provider, please refer to [email or web address]. After hours, contact General Neurology

## 2016-12-12 NOTE — Progress Notes (Signed)
PULMONARY / CRITICAL CARE MEDICINE   Name: Lawrence Kane MRN: [id number] DOB: July 14, 1966    ADMISSION DATE:  12/01/2016 CONSULTATION DATE:  12/01/2016  REFERRING MD:  [name] transfer  CHIEF COMPLAINT:  AMS  HISTORY OF PRESENT ILLNESS:   51 year old man with HIV/AIDS (first diagnosed 2003, last CD4 count 245 with viremia of 1,160 in October 2017; on Genovya, Darunavir and Bactrim), Genital Warts, COPD, and cocaine substance abuse who presented to [facility] on 1/24 with complaint of altered mental status. Per notes, patient may have recently had some type of surgery in [place] where his mother resides. Afebrile, tachycardic to 155, RR 18, hypertensive to 240/112. Patient was intubated for airway protection, CVL in IJ was placed, and LP was performed given his altered mental status. He was started on Versed and propofol for sedation. Initial labs showed leukocytosis of 13.9, hemoglobin 10.9, hct 33.6 and plts 269. BMET showed Sodium 139, K 3.9, Cl 101, Bicarb 21, BUN 23, Creatine 1.10, glucose 163. ABG showed pH of 7.28, pCO2 48, pO2 158, on FiO2 of 40%, PEEP 5, TV 450 and RR 18. Lactic Acid 3.3, 6.9. AST 125, ALT 224, and Alk Phos 88. UA negative for infection. UDS positive for cocaine. CSF appeared yellow, cloudy, xanthochromic, WBC 4845, RBC 625, glucose <20, total protein >300, Neutrophils 16%, Eos 64%, monocytes 17%, lymphocytes 3%. Gram stain performed, but unable to view results. ID was consulted. Dr. Baxter Flattery recommended continuing Truvada and Tivicay through NGT  SUBJECTIVE:  On and off agitation. Better though Less fevers.  Still with flaccid BLE.   VITAL SIGNS: BP (!) 142/59   Pulse 72   Temp 98.9 F (37.2 C) (Oral)   Resp 19   Ht _0  (1.93 m)   Wt 103.3 kg (227 lb 11.8 oz)   SpO2 98%   BMI 27.72 kg/m   VENTILATOR SETTINGS: Vent Mode: PRVC FiO2 (%):  [30 %] 30 % Set Rate:  [16 bmp] 16 bmp Vt Set:  [620 mL] 620 mL PEEP:  [5 cmH20] 5 cmH20 Plateau Pressure:  [15  cmH20-23 cmH20] 17 cmH20  INTAKE / OUTPUT: I/O last 3 completed shifts: In: 5465 [I.V.:1701; NG/GT:2100; IV Piggyback:2350] Out: 7700 [Urine:7700]  PHYSICAL EXAMINATION: General Apperance: NAD HEENT: Normocephalic, atraumatic, anicteric sclera Neck: Supple, trachea midline Lungs: good ae, crackles at bases. (-) rhochi/wheeze.  Heart: Regular rate and rhythm, no murmur/rub/gallop Abdomen: Soft, nontender, nondistended Extremities: Warm and well perfused, Gr 2 edema > less edema Skin: Condyloma on lips Neurologic: Sedated. In and out of confusion/agitation.  Moves BUE.  BLE remain flaccid.   LABS:  BMET  Recent Labs Lab 12/10/16 0424 12/11/16 0330 12/12/16 0319  NA 131* 133* 133*  K 4.7 4.5 5.2*  CL 93* 93* 90*  CO2 31 33* 34*  BUN 29* 25* 23*  CREATININE 1.19 0.97 0.83  GLUCOSE 180* 211* 245*    Electrolytes  Recent Labs Lab 12/10/16 0424 12/11/16 0330 12/12/16 0319  CALCIUM 7.8* 8.2* 8.1*  MG 2.1 1.9 1.7  PHOS 2.7 3.6 3.2    CBC  Recent Labs Lab 12/10/16 0424 12/11/16 0330 12/12/16 0319  WBC 10.1 9.6 12.3*  HGB 8.3* 8.5* 9.0*  HCT 26.1* 27.2* 29.0*  PLT 277 286 356    Coag's  Recent Labs Lab 12/06/16 1215  INR 1.18    Sepsis Markers No results for input(s): LATICACIDVEN, PROCALCITON, O2SATVEN in the last 168 hours.  ABG  Recent Labs Lab 12/08/16 2325  PHART 7.424  PCO2ART 52.5*  PO2ART 78.3*    Liver Enzymes  Recent Labs Lab 12/10/16 0424 12/11/16 0330 12/12/16 0319  AST 418* 322* 197*  ALT 397* 391* 350*  ALKPHOS 83 94 101  BILITOT 1.2 0.9 0.9  ALBUMIN 1.7* 1.6* 1.8*    Cardiac Enzymes No results for input(s): TROPONINI, PROBNP in the last 168 hours.  Glucose  Recent Labs Lab 12/11/16 1625 12/11/16 1923 12/11/16 1924 12/12/16 0043 12/12/16 0357 12/12/16 0836  GLUCAP 206* 181* 170* 200* 238* 300*    Imaging Mr Cervical Spine W Wo Contrast  Result Date: 12/11/2016 CLINICAL DATA:  Paraplegia. Evaluate for  abscess or post LP epidural hematoma. HIV and cocaine use. EXAM: MRI TOTAL SPINE WITHOUT AND WITH CONTRAST TECHNIQUE: Multisequence MR imaging of the spine from the cervical spine to the sacrum was performed prior to and following IV contrast administration for evaluation of spinal metastatic disease. CONTRAST:  19m MULTIHANCE GADOBENATE DIMEGLUMINE 529 MG/ML IV SOLN COMPARISON:  None. FINDINGS: MRI CERVICAL SPINE FINDINGS Alignment: Physiologic. Vertebrae: No fracture, evidence of discitis, or bone lesion. Hypointense marrow usually from anemia. Cord: The cord at has normal signal and morphology, but there is diffuse superficial enhancement in which also extends along the bilateral nerve roots. 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 There is also folia enhancement and superficial brainstem enhancement compatible with meningitis. Intubation with fluid throughout the paranasal sinuses and airway above the cuff. Disc levels: No herniation or impingement. MRI THORACIC SPINE FINDINGS Alignment:  Physiologic. Vertebrae: No fracture, evidence of discitis, or bone lesion. Hypointense marrow likely from anemia Cord: Predominately dorsal dural thickening throughout the thoracic canal. Diffuse cord signal abnormality with patchy appearance mainly along the posterior half of the peripheral cord. No notable swelling. Some of these areas enhance, including at in the T5 and T6 region. Direct infection or patchy infarct from perforators (expected the dorsal cord vessels would be greater involved in this patient who has been intubated and supine) Paraspinal and other soft tissues: Consolidation in both lower lobes. Disc levels: No herniation or impingement. MRI LUMBAR SPINE FINDINGS Segmentation:  Standard. Alignment:  Physiologic.  Vertebrae: No fracture, evidence of discitis, or bone lesion. Hypointense marrow likely from anemia. Conus medullaris: Extends to the L1 level and appears normal. Paraspinal and other soft tissues: Negative Disc levels: Mild disc desiccation and bulging at L3-4 and L4-5. Findings are most likely infectious, both by imaging and based on clinical circumstances. Neurosarcoid can have similar findings, but usually and a different clinical setting. There is no specific imaging feature to differentiate bacterial, viral, and helminth infection. IMPRESSION: 1. No abscess or hematoma as questioned clinically. 2. Generalized meningitis and radiculitis. 3. 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 4. Findings are nonspecific. In addition to the pathogens considered in the chart, would also check CMV. Neurosarcoid can have the same findings, but usually in a different clinical setting. Electronically Signed   By: JMonte FantasiaM.D.   On: 12/11/2016 13:49   Mr Thoracic Spine W Wo Contrast  Result Date: 12/11/2016 CLINICAL DATA:  Paraplegia. Evaluate for abscess or post LP epidural hematoma. HIV and cocaine use. EXAM: MRI TOTAL SPINE WITHOUT AND WITH CONTRAST TECHNIQUE: Multisequence MR imaging of the spine from the cervical spine to the sacrum was performed prior to and following IV contrast administration for evaluation of spinal metastatic  disease. CONTRAST:  71m MULTIHANCE GADOBENATE DIMEGLUMINE 529 MG/ML IV SOLN COMPARISON:  None. FINDINGS: MRI CERVICAL SPINE FINDINGS Alignment: Physiologic. Vertebrae: No fracture, evidence of discitis, or bone lesion. Hypointense marrow usually from anemia. Cord: The cord at has normal signal and morphology, but there is diffuse superficial enhancement in which also extends along the bilateral nerve roots. Posterior Fossa, vertebral arteries, paraspinal tissues: On sagittal postcontrast imaging there is superficial enhancement in the left  more than right inferior cerebellum which correlates with early subacute infarcts seen on prior brain MRI. There is also folia enhancement and superficial brainstem enhancement compatible with meningitis. Intubation with fluid throughout the paranasal sinuses and airway above the cuff. Disc levels: No herniation or impingement. MRI THORACIC SPINE FINDINGS Alignment:  Physiologic. Vertebrae: No fracture, evidence of discitis, or bone lesion. Hypointense marrow likely from anemia Cord: Predominately dorsal dural thickening throughout the thoracic canal. Diffuse cord signal abnormality with patchy appearance mainly along the posterior half of the peripheral cord. No notable swelling. Some of these areas enhance, including at in the T5 and T6 region. Direct infection or patchy infarct from perforators (expected the dorsal cord vessels would be greater involved in this patient who has been intubated and supine) Paraspinal and other soft tissues: Consolidation in both lower lobes. Disc levels: No herniation or impingement. MRI LUMBAR SPINE FINDINGS Segmentation:  Standard. Alignment:  Physiologic. Vertebrae: No fracture, evidence of discitis, or bone lesion. Hypointense marrow likely from anemia. Conus medullaris: Extends to the L1 level and appears normal. Paraspinal and other soft tissues: Negative Disc levels: Mild disc desiccation and bulging at L3-4 and L4-5. Findings are most likely infectious, both by imaging and based on clinical circumstances. Neurosarcoid can have similar findings, but usually and a different clinical setting. There is no specific imaging feature to differentiate bacterial, viral, and helminth infection. IMPRESSION: 1. No abscess or hematoma as questioned clinically. 2. Generalized meningitis and radiculitis. 3. 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 4. Findings are nonspecific. In addition to the pathogens considered in the chart, would also  check CMV. Neurosarcoid can have the same findings, but usually in a different clinical setting. Electronically Signed   By: JMonte FantasiaM.D.   On: 12/11/2016 13:49   Mr Lumbar Spine W Wo Contrast  Result Date: 12/11/2016 CLINICAL DATA:  Paraplegia. Evaluate for abscess or post LP epidural hematoma. HIV and cocaine use. EXAM: MRI TOTAL SPINE WITHOUT AND WITH CONTRAST TECHNIQUE: Multisequence MR imaging of the spine from the cervical spine to the sacrum was performed prior to and following IV contrast administration for evaluation of spinal metastatic disease. CONTRAST:  231mMULTIHANCE GADOBENATE DIMEGLUMINE 529 MG/ML IV SOLN COMPARISON:  None. FINDINGS: MRI CERVICAL SPINE FINDINGS Alignment: Physiologic. Vertebrae: No fracture, evidence of discitis, or bone lesion. Hypointense marrow usually from anemia. Cord: The cord at has normal signal and morphology, but there is diffuse superficial enhancement in which also extends along the bilateral nerve roots. 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 There is also folia enhancement and superficial brainstem enhancement compatible with meningitis. Intubation with fluid throughout the paranasal sinuses and airway above the cuff. Disc levels: No herniation or impingement. MRI THORACIC SPINE FINDINGS Alignment:  Physiologic. Vertebrae: No fracture, evidence of discitis, or bone lesion. Hypointense marrow likely from anemia Cord: Predominately dorsal dural thickening throughout the  thoracic canal. Diffuse cord signal abnormality with patchy appearance mainly along the posterior half of the peripheral cord. No notable swelling. Some of these areas enhance, including at in the T5 and T6 region. Direct infection or patchy infarct from perforators (expected the dorsal cord  vessels would be greater involved in this patient who has been intubated and supine) Paraspinal and other soft tissues: Consolidation in both lower lobes. Disc levels: No herniation or impingement. MRI LUMBAR SPINE FINDINGS Segmentation:  Standard. Alignment:  Physiologic. Vertebrae: No fracture, evidence of discitis, or bone lesion. Hypointense marrow likely from anemia. Conus medullaris: Extends to the L1 level and appears normal. Paraspinal and other soft tissues: Negative Disc levels: Mild disc desiccation and bulging at L3-4 and L4-5. Findings are most likely infectious, both by imaging and based on clinical circumstances. Neurosarcoid can have similar findings, but usually and a different clinical setting. There is no specific imaging feature to differentiate bacterial, viral, and helminth infection. IMPRESSION: 1. No abscess or hematoma as questioned clinically. 2. Generalized meningitis and radiculitis. 3. 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 4. Findings are nonspecific. In addition to the pathogens considered in the chart, would also check CMV. Neurosarcoid can have the same findings, but usually in a different clinical setting. Electronically Signed   By: Monte Fantasia M.D.   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 Respiratory failure. Initial encounter. EXAM: PORTABLE CHEST 1 VIEW COMPARISON:  12/09/2016 and earlier. FINDINGS: Portable AP semi upright view at 0443 hours. Endotracheal tube tip between the level the clavicles and carina. Enteric tube courses to the abdomen and side hole projects at the level of the gastric body. Stable left IJ central line, tip projects at the subclavian and jugular venous confluence. Stable lung volumes. Stable cardiac size and mediastinal contours. Continued dense retrocardiac opacity obscuring the left  hemidiaphragm. Mild superimposed bilateral perihilar reticulonodular opacity. No pneumothorax or pulmonary edema. Ventilation has not significantly changed since 12/08/2016. IMPRESSION: 1. Stable lines and tubes. Note that the left IJ central line tip is at the confluence of the left subclavian and jugular veins. 2. Since 12/08/2016 there is Left lower lobe collapse or consolidation with superimposed bilateral perihilar reticulonodular opacity. Favor bilateral pneumonia. Electronically Signed   By: Genevie Ann M.D.   On: 12/12/2016 07:05     STUDIES:  1/24 CXR >> ATX vs LLL infiltrate 1/25 CT head >> neg for acute intracranial process 1/25 LP cytology >> no malignant cells 1/25 LP >> yellow, cloudy, glucose 20, total protein >600, RBC 114>49, WBC 970, 36% neutrophil, 20% lymphs, 19% eos 1/28 BAL cell count >> 266 WBC, eos 1 1/28 BAL cytology >> no malignant cells 1/27 BAL for wet mount> neg for parasites 1/27 MRI brain >> small acute infarcts in cerebellum and cerebral cortex, sucal signal 1/29 LP >> Glucose 82, protein >600, WBC 600>885, eosinophils 28% 1/29 Echo >> LV EF 60-65%, indeterminent diastolic function, wall motion normal 1/30 EEG >> diffuse slowing and background suppression, no seizures 2/3 MRI thoracic/lumbar/cervical >> (-) abscess. Meningitis. R/O CMV   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 1/24 Cryptococcal [name]): negative 1/24 CSF [facility]): NGTD 1/22 scrotal wound cx [name]): MRSA Toxoplasma [name]): Negative 1/25 BCx x2 : NGTD 1/25 HCV ab + 1/24 Trach asp 1/24: NGTD 1/24 MRSA PCR: MRSA pos 1/25 HIV quant:  80, CD4 10 1/25 CSF AFB: Smear neg, >> 1/25 CSF fungal cx: NGTD 1/25 CSF crypto ag: neg 1/25 CSF gram stain no organisms, cx: NGTD 1/27 strongyloides ab >> negative 1/27 coccidiomycosis ab >> 1/28 BAL AFB smear/cx> 1/28 BAL Fungus cx >> 1/28 BAL gram stain/Cx: 40K ESBL Klebsiella pneumoniae 1/28 BAL  pneumocystis smear: Neg 1/27 O&P 1/29 CSF Cx >> no organisms on gram stain, >> NGTD 2/4   CSF CMV >>   ANTIBIOTICS: Rocephin 1/24>>1/30 Vancomycin 1/24>>1/30; 2/2 >  Acyclovir 1/24>> 1/26, 1/27>> Ampicillin 1/24>> 1/27 Bactrim M/W/F for PCP prophylaxis Meropenem 1/30>> 2/1; 2/2 >  Pen G 2/1 >> 2/2 Cipro 2/1 >> 2/2  SIGNIFICANT EVENTS: 1/24> Transferred from [facility] 1/25> Underwent LP here  LINES/TUBES: L IJ CVL 1/24 >> ETT 1/24 >> Foley 1/24 >> OGT 1/24 >>  DISCUSSION: 51 year old man with HIV/AIDS (first diagnosed 2003, last CD4 count 245 with viremia of 1,160 in October 2017; on Genovya, Darunavir and Bactrim), Genital Warts, COPD, and cocaine substance abuse who presented to [facility] [place] on 1/24 with altered mental status. Intubated for airway protection.   ASSESSMENT / PLAN:  PULMONARY A: Acute Hypoxemic Respiratory Failure 2/2 HCAP ESBL Klebsiella LLL+ sepsis + Unable to protect airway + pulm edema P:   Continue full vent support. PST as tolerated.  [place] pt. Will decrease lasix dose 2/2 azotemia. From 40 q12 to 20 q12.  Mental status and agitation are barriers to extubation as well.  Daily wake up assessment Antibiotics as per ID section Will need tracheostomy. Mother consented.   CARDIOVASCULAR A:  Sinus Tachycardia Incomplete RBBB HTN Pulm edema P:  Hydralazine prn Cont lasix  RENAL A:   Pulm edema P:   Follow labs, urine output, creatinine Lasix 20 mg IV BID from 40 mg IV BID  >> observe renal fxn with diuresis and acyclovir.   GASTROINTESTINAL A:   Transaminitis, (worsened by seroquel likely which was discontinued on 1/31). better HCV ab+, Chronic hep b P:   Continue tube feeds Follow LFTs Famotidine 20 mg IV BID  HEMATOLOGIC A:   Mild leukocytosis > Resolved Mild anemia P:  Sub Q heparin for VTE ppx  INFECTIOUS A:   Meningitis, bacterial + viral. 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 Cont Vanc, acyclovir, meropenem. Holding off on CMV meds pending CSF CMV result.  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 Will consider decreasing clonazepam if he does not wake up.  RASS goal: 0 to -1 ASA daily Neuro following. MRA head and neck U/R. MRI of thoiracic/cervical/lumbar areas concerning for meningitis. R/O CMV,  Has Thorazine prn for hiccups  FAMILY  - Updates: Mother updated via the phone on 2/4 and discussed with her over all prognosis.  She wants everything done. She agrees to a trache.  - Inter-disciplinary family meet or Palliative Care  meeting due by:  2/7   I spent  30  minutes of Critical Care time with this patient today.    Monica Becton, MD 12/12/2016, 11:55 AM [place] [facility] Pager [phone] After 3 pm or if no answer, call [phone]

## 2016-12-13 ENCOUNTER — Inpatient Hospital Stay (HOSPITAL_COMMUNITY): Payer: Medicaid Other

## 2016-12-13 ENCOUNTER — Other Ambulatory Visit (HOSPITAL_COMMUNITY): Payer: Self-pay | Admitting: Respiratory Therapy

## 2016-12-13 DIAGNOSIS — J9600 Acute respiratory failure, unspecified whether with hypoxia or hypercapnia: Secondary | ICD-10-CM

## 2016-12-13 DIAGNOSIS — G373 Acute transverse myelitis in demyelinating disease of central nervous system: Secondary | ICD-10-CM

## 2016-12-13 DIAGNOSIS — Z4659 Encounter for fitting and adjustment of other gastrointestinal appliance and device: Secondary | ICD-10-CM

## 2016-12-13 DIAGNOSIS — R400 Somnolence: Secondary | ICD-10-CM

## 2016-12-13 LAB — COMPREHENSIVE METABOLIC PANEL
ALBUMIN: 1.7 g/dL — AB (ref 3.5–5.0)
ALK PHOS: 93 U/L (ref 38–126)
ALT: 248 U/L — AB (ref 17–63)
AST: 126 U/L — ABNORMAL HIGH (ref 15–41)
Anion gap: 4 — ABNORMAL LOW (ref 5–15)
BUN: 24 mg/dL — AB (ref 6–20)
CALCIUM: 8.4 mg/dL — AB (ref 8.9–10.3)
CHLORIDE: 97 mmol/L — AB (ref 101–111)
CO2: 35 mmol/L — AB (ref 22–32)
CREATININE: 0.82 mg/dL (ref 0.61–1.24)
GFR calc non Af Amer: >60 mL/min (ref >60)
GLUCOSE: 223 mg/dL — AB (ref 65–99)
Potassium: 4.1 mmol/L (ref 3.5–5.1)
SODIUM: 136 mmol/L (ref 135–145)
Total Bilirubin: 0.7 mg/dL (ref 0.3–1.2)
Total Protein: 7.6 g/dL (ref 6.5–8.1)

## 2016-12-13 LAB — MAGNESIUM: Magnesium: 2 mg/dL (ref 1.7–2.4)

## 2016-12-13 LAB — CULTURE, BLOOD (ROUTINE X 2)
CULTURE: NO GROWTH
Culture: NO GROWTH

## 2016-12-13 LAB — MISC LABCORP TEST (SEND OUT): Labcorp test code: 138602

## 2016-12-13 LAB — PROTIME-INR
INR: 0.99
PROTHROMBIN TIME: 13.1 s (ref 11.4–15.2)

## 2016-12-13 LAB — CBC
HCT: 26.7 % — ABNORMAL LOW (ref 39.0–52.0)
HEMOGLOBIN: 8.4 g/dL — AB (ref 13.0–17.0)
MCH: 30.8 pg (ref 26.0–34.0)
MCHC: 31.5 g/dL (ref 30.0–36.0)
MCV: 97.8 fL (ref 78.0–100.0)
PLATELETS: 321 10*3/uL (ref 150–400)
RBC: 2.73 MIL/uL — ABNORMAL LOW (ref 4.22–5.81)
RDW: 17.5 % — ABNORMAL HIGH (ref 11.5–15.5)
WBC: 10.5 10*3/uL (ref 4.0–10.5)

## 2016-12-13 LAB — GLUCOSE, CAPILLARY
GLUCOSE-CAPILLARY: 111 mg/dL — AB (ref 65–99)
Glucose-Capillary: 216 mg/dL — ABNORMAL HIGH (ref 65–99)
Glucose-Capillary: 158 mg/dL — ABNORMAL HIGH (ref 65–99)
Glucose-Capillary: 196 mg/dL — ABNORMAL HIGH (ref 65–99)
Glucose-Capillary: 138 mg/dL — ABNORMAL HIGH (ref 65–99)
Glucose-Capillary: 142 mg/dL — ABNORMAL HIGH (ref 65–99)

## 2016-12-13 LAB — APTT: APTT: 35 s (ref 24–36)

## 2016-12-13 LAB — PHOSPHORUS: PHOSPHORUS: 2.6 mg/dL (ref 2.5–4.6)

## 2016-12-13 MED ORDER — ETOMIDATE 2 MG/ML IV SOLN
40.0000 mg | Freq: Once | INTRAVENOUS | Status: AC
  Administered 2016-12-13: 20 mg via INTRAVENOUS
  Filled 2016-12-13: qty 20

## 2016-12-13 MED ORDER — FENTANYL BOLUS VIA INFUSION
100.0000 ug | Freq: Once | INTRAVENOUS | Status: AC
  Administered 2016-12-13: 100 ug via INTRAVENOUS

## 2016-12-13 MED ORDER — MIDAZOLAM HCL 2 MG/2ML IJ SOLN
4.0000 mg | Freq: Once | INTRAMUSCULAR | Status: AC
  Administered 2016-12-13: 4 mg via INTRAVENOUS
  Filled 2016-12-13: qty 4

## 2016-12-13 MED ORDER — VECURONIUM BROMIDE 10 MG IV SOLR
10.0000 mg | Freq: Once | INTRAVENOUS | Status: AC
  Administered 2016-12-13: 10 mg via INTRAVENOUS

## 2016-12-13 MED ORDER — SODIUM BICARBONATE 8.4 % IV SOLN
INTRAVENOUS | Status: AC
  Filled 2016-12-13: qty 100

## 2016-12-13 MED ORDER — FENTANYL CITRATE (PF) 100 MCG/2ML IJ SOLN: 200.0000 ug | Freq: Once | INTRAMUSCULAR | Status: DC

## 2016-12-13 MED ORDER — PROPOFOL 500 MG/50ML IV EMUL: 5.0000 ug/kg/min | Freq: Once | INTRAVENOUS | Status: DC

## 2016-12-13 MED ORDER — SULFAMETHOXAZOLE-TRIMETHOPRIM 800-160 MG PO TABS
1.0000 | ORAL_TABLET | Freq: Every day | ORAL | Status: DC
  Filled 2016-12-13 (×2): qty 1

## 2016-12-13 NOTE — Procedures (Signed)
Bedside Tracheostomy Insertion Procedure Note   Patient Details:   Name: Lawrence Kane DOB: [birth year] MRN: [id number]  Procedure: Tracheostomy  Pre Procedure Assessment: ET Tube Size:7.5 ET Tube secured at lip (cm):23 at the lip Bite block in place: Yes Breath Sounds: Rhonch  Post Procedure Assessment: BP 114/67   Pulse 72   Temp 99.4 F (37.4 C) (Oral)   Resp 20   Ht 6\' 4"  (1.93 m)   Wt 235 lb 0.2 oz (106.6 kg)   SpO2 100%   BMI 28.61 kg/m  O2 sats: stable throughout Complications: No apparent complications Patient did tolerate procedure well Tracheostomy Brand:Shiley Tracheostomy Style:Cuffed Tracheostomy Size: 8.0 Tracheostomy Secured WGN:[id number]:Sutures and velcro Tracheostomy Placement Confirmation:Trach cuff visualized and in place and chest x-ray    Lawrence Kane, Lawrence Kane 12/13/2016, 4:57 PM

## 2016-12-13 NOTE — Progress Notes (Signed)
Patient continuously coughing on vent with vomiting of tube feed. 02 sats 45%. 100% Fi02 breaths given via ventilator. Tube feeds stopped. Patient inline ETT suctioned with secretions that resemble tube feed. 02 sats up to 100%. RTT Leotis Shames(Lauren) and MD (Dr. Tyson AliasFeinstein) notified. RN will continue to monitor.

## 2016-12-13 NOTE — Progress Notes (Signed)
No acute events overnight patient was responsive and appropriate off of sedation followed commands, was not aggressive for a short period of time. Restraints renewed for pt attempt to interact with invasive lines. Daytime RN made aware of family wishes to speak directly with MD for updates.

## 2016-12-13 NOTE — Progress Notes (Signed)
PULMONARY / CRITICAL CARE MEDICINE   Name: Lawrence Kane MRN: [id number] DOB: October 17, 1966    ADMISSION DATE:  12/01/2016 CONSULTATION DATE:  12/01/2016  REFERRING MD:  [name] transfer  CHIEF COMPLAINT:  AMS  HISTORY OF PRESENT ILLNESS:   51 year old man with HIV/AIDS (first diagnosed 2003, last CD4 count 245 with viremia of 1,160 in October 2017; on Genovya, Darunavir and Bactrim), Genital Warts, COPD, and cocaine substance abuse who presented to [facility] on 1/24 with complaint of altered mental status. Per notes, patient may have recently had some type of surgery in [place] where his mother resides. Afebrile, tachycardic to 155, RR 18, hypertensive to 240/112. Patient was intubated for airway protection, CVL in IJ was placed, and LP was performed given his altered mental status. He was started on Versed and propofol for sedation. Initial labs showed leukocytosis of 13.9, hemoglobin 10.9, hct 33.6 and plts 269. BMET showed Sodium 139, K 3.9, Cl 101, Bicarb 21, BUN 23, Creatine 1.10, glucose 163. ABG showed pH of 7.28, pCO2 48, pO2 158, on FiO2 of 40%, PEEP 5, TV 450 and RR 18. Lactic Acid 3.3, 6.9. AST 125, ALT 224, and Alk Phos 88. UA negative for infection. UDS positive for cocaine. CSF appeared yellow, cloudy, xanthochromic, WBC 4845, RBC 625, glucose <20, total protein >300, Neutrophils 16%, Eos 64%, monocytes 17%, lymphocytes 3%. Gram stain performed, but unable to view results. ID was consulted. Dr. Baxter Flattery recommended continuing Truvada and Tivicay through NGT  SUBJECTIVE:  Remains on vent  VITAL SIGNS: BP (!) 143/74   Pulse 90   Temp 99.7 F (37.6 C) (Oral)   Resp (!) 22   Ht _0  (1.93 m)   Wt 106.6 kg (235 lb 0.2 oz)   SpO2 99%   BMI 28.61 kg/m   VENTILATOR SETTINGS: Vent Mode: PRVC FiO2 (%):  [30 %] 30 % Set Rate:  [16 bmp] 16 bmp Vt Set:  [811 mL] 620 mL PEEP:  [5 cmH20] 5 cmH20 Plateau Pressure:  [16 cmH20-20 cmH20] 20 cmH20  INTAKE / OUTPUT: I/O last 3  completed shifts: In: 5527.9 [I.V.:1152.9; NG/GT:2535; IV Piggyback:1840] Out: 7050 [Urine:7050]  PHYSICAL EXAMINATION: General: sedated  rass -2 Neuro:rn says fc on 20 propofol, moves all ext HEENT: jvd, ett PULM: ronchi CV: s1 s2 RRR no r GI: soft, BS low, no r, vomited Extremities: edema   LABS:  BMET  Recent Labs Lab 12/11/16 0330 12/12/16 0319 12/13/16 0219  NA 133* 133* 136  K 4.5 5.2* 4.1  CL 93* 90* 97*  CO2 33* 34* 35*  BUN 25* 23* 24*  CREATININE 0.97 0.83 0.82  GLUCOSE 211* 245* 223*    Electrolytes  Recent Labs Lab 12/11/16 0330 12/12/16 0319 12/13/16 0219  CALCIUM 8.2* 8.1* 8.4*  MG 1.9 1.7 2.0  PHOS 3.6 3.2 2.6    CBC  Recent Labs Lab 12/11/16 0330 12/12/16 0319 12/13/16 0219  WBC 9.6 12.3* 10.5  HGB 8.5* 9.0* 8.4*  HCT 27.2* 29.0* 26.7*  PLT 286 356 321    Coag's  Recent Labs Lab 12/06/16 1215  INR 1.18    Sepsis Markers No results for input(s): LATICACIDVEN, PROCALCITON, O2SATVEN in the last 168 hours.  ABG  Recent Labs Lab 12/08/16 2325  PHART 7.424  PCO2ART 52.5*  PO2ART 78.3*    Liver Enzymes  Recent Labs Lab 12/11/16 0330 12/12/16 0319 12/13/16 0219  AST 322* 197* 126*  ALT 391* 350* 248*  ALKPHOS 94 101 93  BILITOT 0.9 0.9 0.7  ALBUMIN 1.6* 1.8* 1.7*    Cardiac Enzymes No results for input(s): TROPONINI, PROBNP in the last 168 hours.  Glucose  Recent Labs Lab 12/12/16 1219 12/12/16 1639 12/12/16 1945 12/12/16 2358 12/13/16 0429 12/13/16 0814  GLUCAP 245* 201* 123* 216* 216* 158*    Imaging No results found.   STUDIES:  1/24 CXR >> ATX vs LLL infiltrate 1/25 CT head >> neg for acute intracranial process 1/25 LP cytology >> no malignant cells 1/25 LP >> yellow, cloudy, glucose 20, total protein >600, RBC 114>49, WBC 970, 36% neutrophil, 20% lymphs, 19% eos 1/28 BAL cell count >> 266 WBC, eos 1 1/28 BAL cytology >> no malignant cells 1/27 BAL for wet mount> neg for parasites 1/27  MRI brain >> small acute infarcts in cerebellum and cerebral cortex, sucal signal 1/29 LP >> Glucose 82, protein >600, WBC 600>885, eosinophils 28% 1/29 Echo >> LV EF 60-65%, indeterminent diastolic function, wall motion normal 1/30 EEG >> diffuse slowing and background suppression, no seizures 2/3 MRI thoracic/lumbar/cervical >> (-) abscess. Meningitis. R/O CMV   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 1/24 Cryptococcal [name]): negative 1/24 CSF [facility]): NGTD 1/22 scrotal wound cx [name]): MRSA Toxoplasma [name]): Negative 1/25 BCx x2 : NGTD 1/25 HCV ab + 1/24 Trach asp 1/24: NGTD 1/24 MRSA PCR: MRSA pos 1/25 HIV quant: 80, CD4 10 1/25 CSF AFB: Smear neg, >> 1/25 CSF fungal cx: NGTD 1/25 CSF crypto ag: neg 1/25 CSF gram stain no organisms, cx: NGTD 1/27 strongyloides ab >> negative 1/27 coccidiomycosis ab >> 1/28 BAL AFB smear/cx> 1/28 BAL Fungus cx >> 1/28 BAL gram stain/Cx: 40K ESBL Klebsiella pneumoniae 1/28 BAL pneumocystis smear: Neg 1/27 O&P 1/29 CSF Cx >> no organisms on gram stain, >> NGTD 2/4   CSF CMV >>   ANTIBIOTICS: Rocephin 1/24>>1/30 Vancomycin 1/24>>1/30; 2/2 >  Acyclovir 1/24>> 1/26, 1/27>> Ampicillin 1/24>> 1/27 Bactrim M/W/F for PCP prophylaxis Meropenem 1/30>> 2/1; 2/2 >  Pen G 2/1 >> 2/2 Cipro 2/1 >> 2/2  SIGNIFICANT EVENTS: 1/24> Transferred from [facility] 1/25> Underwent LP here  LINES/TUBES: L IJ CVL 1/24 >> ETT 1/24 >> Foley 1/24 >> OGT 1/24 >>  DISCUSSION: 51 year old man with HIV/AIDS (first diagnosed 2003, last CD4 count 245 with viremia of 1,160 in October 2017; on Genovya, Darunavir and Bactrim), Genital Warts, COPD, and cocaine substance abuse who presented to [facility] on 1/24 with altered mental status. Intubated for airway protection.   ASSESSMENT / PLAN:  PULMONARY A: Acute Hypoxemic Respiratory Failure 2/2 HCAP ESBL Klebsiella LLL+ sepsis + Unable to protect  airway + pulm edema P:   Has secretions, poor neuro status, weak cough, Attempt wean again fails, high rr, low volumes - trach needed Will repeat PS 12-14 today Plan trach in am or this afternoon if able Last abg , keep same MV Neg balance as able  CARDIOVASCULAR A:  Sinus Tachycardia Incomplete RBBB HTN Pulm edema P:  Hydralazine prn Cont lasix  RENAL A:   Pulm edema P:   Follow labs, urine output, creatinine Lasix 20 mg IV BID from 40 mg IV BID  >> observe renal fxn with diuresis and acyclovir.   GASTROINTESTINAL A:   Transaminitis, (worsened by seroquel likely which was discontinued on 1/31). better HCV ab+, Chronic hep b P:   Continue tube feeds- hold for possible trach Follow LFTs Famotidine 20 mg IV BID lft in 48 hiours  HEMATOLOGIC A:   Mild leukocytosis > Resolved Mild anemia  P:  Sub Q heparin for VTE ppx  INFECTIOUS A:   Meningitis, bacterial + viral. 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  She wants everything done. She agrees to a trache.  - Inter-disciplinary family meet or Palliative Care meeting due by:  2/7  Ccm time 30 min   Lavon Paganini. Titus Mould, MD, [place] Pgr: [facility]

## 2016-12-13 NOTE — Procedures (Signed)
Name:  Lawrence NorthDarren E Kane MRN:  [id number] DOB:  02-14-1966  OPERATIVE NOTE  Procedure:  Percutaneous tracheostomy.  Indications:  Ventilator-dependent respiratory failure.  Consent:  Procedure, alternatives, risks and benefits discussed with medical POA.  Questions answered.  Consent obtained.  Anesthesia:  Versed, fent, prop, vec  Procedure summary:  Appropriate equipment was assembled.  The patient was identified as Lawrence Northarren E Elgersma and safety timeout was performed. The patient was placed in supine position with a towel roll behind shoulder blades and neck extended.  Sterile technique was used. The patient's neck and upper chest were prepped using chlorhexidine / alcohol scrub and the field was draped in usual sterile fashion with full body drape. After the adequate sedation / anesthesia was achieved, attention was directed at the midline trachea, where the cricothyroid membrane was palpated. Approximately two fingerbreadths above the sternal notch, a vertical incision was created with a scalpel after local infiltration with 0.2% Lidocaine. Then, using Seldinger technique and a percutaneous tracheostomy set, the trachea was entered with a 14 gauge needle with an overlying sheath. This was all confirmed under direct visualization of a fiberoptic flexible bronchoscope. Entrance into the trachea was identified through the third tracheal ring interspace. Following this, a guidewire was inserted. The needle was removed, leaving the sheath and the guidewire intact. Next, the sheath was removed and a small dilator was inserted. The tracheal rings were then dilated. A #8 Shiley was then opened. The balloon was checked. It was placed over a tracheal dilator, which was then advanced over the guidewire and through the previously dilated tract. The Shiley tracheostomy tube was noted to pass in the trachea with little resistance. The guidewire and dilator tubes were removed from the trachea. An inner cannula was  placed through the tracheostomy tube. The tracheostomy was then secured at the anterior neck with 4 monofilament sutures. The oral endotracheal tube was removed and the ventilator was attached to the newly placed tracheostomy tube. Adequate tidal volumes were noted. The cuff was inflated and no evidence of air leak was noted. No evidence of bleeding was noted. At this point, the procedure was concluded. Post-procedure chest x-ray was ordered.  Complications:  No immediate complications were noted.  Hemodynamic parameters and oxygenation remained stable throughout the procedure.  Estimated blood loss:  Less then 5 mL.  Nelda BucksFEINSTEIN,Tramar Brueckner J., MD Pulmonary and Critical Care Medicine [facility] Pager: [phone]  12/13/2016, 4:37 PM  [facility] [phone]

## 2016-12-13 NOTE — Progress Notes (Addendum)
Subjective: Unable to wean off sedation this morning. S/p tracheostomy today.   Antibiotics:  Anti-infectives    Start     Dose/Rate Route Frequency Ordered Stop   12/12/16 1800  acyclovir (ZOVIRAX) 1,000 mg in sodium chloride 0.9 % 150 mL injection     150 mL/hr over 60 Minutes Intravenous Every 8 hours 12/12/16 1410     12/10/16 1400  meropenem (MERREM) 2 g in sodium chloride 0.9 % 100 mL IVPB     2 g 200 mL/hr over 30 Minutes Intravenous Every 8 hours 12/10/16 1140     12/10/16 1300  vancomycin (VANCOCIN) 1,250 mg in sodium chloride 0.9 % 250 mL IVPB  Status:  Discontinued     1,250 mg 166.7 mL/hr over 90 Minutes Intravenous Every 8 hours 12/10/16 1140 12/13/16 0824   12/09/16 1000  penicillin G potassium 4 Million Units in dextrose 5 % 250 mL IVPB  Status:  Discontinued     4 Million Units 250 mL/hr over 60 Minutes Intravenous Every 4 hours 12/09/16 0851 12/10/16 1109   12/09/16 1000  ciprofloxacin (CIPRO) IVPB 400 mg  Status:  Discontinued     400 mg 200 mL/hr over 60 Minutes Intravenous Every 12 hours 12/09/16 0929 12/10/16 1109   12/07/16 2245  meropenem (MERREM) 2 g in sodium chloride 0.9 % 100 mL IVPB  Status:  Discontinued     2 g 200 mL/hr over 30 Minutes Intravenous Every 8 hours 12/07/16 2236 12/09/16 0851   12/04/16 2200  vancomycin (VANCOCIN) 1,250 mg in sodium chloride 0.9 % 250 mL IVPB  Status:  Discontinued     1,250 mg 166.7 mL/hr over 90 Minutes Intravenous Every 8 hours 12/04/16 1421 12/07/16 1057   12/04/16 1000  acyclovir (ZOVIRAX) 1,000 mg in dextrose 5 % 150 mL IVPB  Status:  Discontinued     1,000 mg 170 mL/hr over 60 Minutes Intravenous Every 8 hours 12/04/16 0943 12/12/16 1409   12/03/16 0900  acyclovir (ZOVIRAX) 1,080 mg in dextrose 5 % 250 mL IVPB  Status:  Discontinued     1,080 mg 271.6 mL/hr over 60 Minutes Intravenous Every 8 hours 12/03/16 0651 12/03/16 0843   12/03/16 0900  acyclovir (ZOVIRAX) 1,000 mg in dextrose 5 % 150 mL IVPB   Status:  Discontinued     1,000 mg 170 mL/hr over 60 Minutes Intravenous Every 8 hours 12/03/16 0843 12/03/16 1002   12/02/16 1015  darunavir-cobicistat (PREZCOBIX) 800-150 MG per tablet 1 tablet     1 tablet Oral Daily with breakfast 12/02/16 1003     12/02/16 1000  emtricitabine-tenofovir AF (DESCOVY) 200-25 MG per tablet 1 tablet     1 tablet Per Tube Daily 12/01/16 2320     12/02/16 1000  dolutegravir (TIVICAY) tablet 50 mg     50 mg Oral Daily 12/01/16 2320     12/02/16 1000  vancomycin (VANCOCIN) 1,250 mg in sodium chloride 0.9 % 250 mL IVPB  Status:  Discontinued     1,250 mg 166.7 mL/hr over 90 Minutes Intravenous Every 24 hours 12/02/16 0010 12/02/16 0634   12/02/16 0900  acyclovir (ZOVIRAX) 930 mg in dextrose 5 % 150 mL IVPB  Status:  Discontinued     10 mg/kg  93 kg 168.6 mL/hr over 60 Minutes Intravenous Every 8 hours 12/02/16 0632 12/03/16 0651   12/02/16 0700  vancomycin (VANCOCIN) IVPB 1000 mg/200 mL premix  Status:  Discontinued     1,000 mg 200 mL/hr  over 60 Minutes Intravenous Every 8 hours 12/02/16 0634 12/04/16 1421   12/02/16 0400  cefTRIAXone (ROCEPHIN) 2 g in dextrose 5 % 50 mL IVPB  Status:  Discontinued     2 g 100 mL/hr over 30 Minutes Intravenous Every 12 hours 12/02/16 0010 12/07/16 2222   12/02/16 0100  acyclovir (ZOVIRAX) 1,110 mg in dextrose 5 % 250 mL IVPB  Status:  Discontinued     10 mg/kg  111.1 kg 272.2 mL/hr over 60 Minutes Intravenous Every 8 hours 12/02/16 0010 12/02/16 0632   12/02/16 0100  sulfamethoxazole-trimethoprim (BACTRIM,SEPTRA) 200-40 MG/5ML suspension 20 mL  Status:  Discontinued     20 mL Per Tube Once per day on Mon Wed Fri 12/02/16 0010 12/06/16 1416   12/02/16 0000  ampicillin (OMNIPEN) 2 g in sodium chloride 0.9 % 50 mL IVPB  Status:  Discontinued     2 g 150 mL/hr over 20 Minutes Intravenous Every 4 hours 12/01/16 2322 12/04/16 0941      Medications: Scheduled Meds: . small volume/piggyback builder   Intravenous Q8H  .  aspirin  325 mg Per Tube Daily  . chlorhexidine gluconate (MEDLINE KIT)  15 mL Mouth Rinse BID  . clonazepam  2 mg Per Tube TID  . darunavir-cobicistat  1 tablet Oral Q breakfast  . dolutegravir  50 mg Oral Daily  . emtricitabine-tenofovir AF  1 tablet Per Tube Daily  . etomidate  40 mg Intravenous Once  . famotidine  20 mg Per Tube BID  . fentaNYL (SUBLIMAZE) injection  200 mcg Intravenous Once  . fentaNYL (SUBLIMAZE) injection  50 mcg Intravenous Once  . furosemide  20 mg Intravenous Q12H  . heparin  5,000 Units Subcutaneous Q8H  . insulin aspart  0-20 Units Subcutaneous Q4H  . insulin aspart  4 Units Subcutaneous Q4H  . insulin glargine  30 Units Subcutaneous Q24H  . mouth rinse  15 mL Mouth Rinse 10 times per day  . meropenem (MERREM) IV  2 g Intravenous Q8H  . midazolam  4 mg Intravenous Once  . polyethylene glycol  17 g Per Tube Daily  . propofol  5-80 mcg/kg/min Intravenous Once  . sodium bicarbonate      . vecuronium  10 mg Intravenous Once   Continuous Infusions: . dextrose    . feeding supplement (VITAL AF 1.2 CAL) Stopped (12/13/16 0935)  . fentaNYL infusion INTRAVENOUS 200 mcg/hr (12/13/16 1213)  . propofol (DIPRIVAN) infusion 20 mcg/kg/min (12/13/16 1204)   PRN Meds:.sodium chloride, acetaminophen, chlorproMAZINE (THORAZINE) IV, dextrose, docusate, fentaNYL, hydrALAZINE, ibuprofen, midazolam, sennosides, sodium chloride flush    Objective: Weight change: 7 lb 4.4 oz (3.3 kg)  Intake/Output Summary (Last 24 hours) at 12/13/16 1326 Last data filed at 12/13/16 1200  Gross per 24 hour  Intake          3647.09 ml  Output             3600 ml  Net            47.09 ml   Blood pressure 125/70, pulse 89, temperature 99.7 F (37.6 C), temperature source Oral, resp. rate 20, height _0  (1.93 m), weight 235 lb 0.2 oz (106.6 kg), SpO2 96 %. Temp:  [99.3 F (37.4 C)-100 F (37.8 C)] 99.7 F (37.6 C) (02/05 1201) Pulse Rate:  [69-108] 89 (02/05 1232) Resp:  [16-31] 20  (02/05 1232) BP: (117-158)/(57-98) 125/70 (02/05 1232) SpO2:  [92 %-100 %] 96 % (02/05 1232) FiO2 (%):  [30 %] 30 % (  02/05 1232) Weight:  [235 lb 0.2 oz (106.6 kg)] 235 lb 0.2 oz (106.6 kg) (02/05 0500)  Physical Exam: General: Sedated HEENT: anicteric sclera, pupils reactive to light and accommodation, ETT in place CVS regular rate, normal rhythm,  no murmur rubs or gallops Chest: clear breath sounds in the anterior lung fields bilaterally Abdomen: soft nontender, nondistended, hypoactive bowel sounds Skin: scrotal ulcers noted with drainage, discolorations noted of the lower extremities bilaterally  Neuro: sedated  CBC: _0 (wbc3,Hgb:3,Hct:3,Plt:3,INR:3APTT:3)@   BMET  Recent Labs  12/12/16 0319 12/13/16 0219  NA 133* 136  K 5.2* 4.1  CL 90* 97*  CO2 34* 35*  GLUCOSE 245* 223*  BUN 23* 24*  CREATININE 0.83 0.82  CALCIUM 8.1* 8.4*     Liver Panel   Recent Labs  12/12/16 0319 12/13/16 0219  PROT 8.4* 7.6  ALBUMIN 1.8* 1.7*  AST 197* 126*  ALT 350* 248*  ALKPHOS 101 93  BILITOT 0.9 0.7    Sedimentation Rate No results for input(s): ESRSEDRATE in the last 72 hours. C-Reactive Protein No results for input(s): CRP in the last 72 hours.  Micro Results: Recent Results (from the past 720 hour(s))  MRSA PCR Screening     Status: Abnormal   Collection Time: 12/01/16 10:26 PM  Result Value Ref Range Status   MRSA by PCR POSITIVE (A) NEGATIVE Final    Comment:        The GeneXpert MRSA Assay (FDA approved for NASAL specimens only), is one component of a comprehensive MRSA colonization surveillance program. It is not intended to diagnose MRSA infection nor to guide or monitor treatment for MRSA infections. RESULT CALLED TO, READ BACK BY AND VERIFIED WITH: R DONAHUE,RN _1  12/02/16 MKELLY,MLT   Culture, respiratory (tracheal aspirate)     Status: None   Collection Time: 12/01/16 11:05 PM  Result Value Ref Range Status   Specimen Description  TRACHEAL ASPIRATE  Final   Special Requests NONE  Final   Gram Stain   Final    FEW WBC PRESENT,BOTH PMN AND MONONUCLEAR NO ORGANISMS SEEN    Culture Consistent with normal respiratory flora.  Final   Report Status 12/04/2016 FINAL  Final  Culture, blood (routine x 2)     Status: None   Collection Time: 12/02/16 12:00 AM  Result Value Ref Range Status   Specimen Description BLOOD LEFT HAND  Final   Special Requests AEROBIC BOTTLE ONLY 10ML  Final   Culture NO GROWTH 5 DAYS  Final   Report Status 12/07/2016 FINAL  Final  Culture, blood (routine x 2)     Status: None   Collection Time: 12/02/16 12:04 AM  Result Value Ref Range Status   Specimen Description BLOOD RIGHT HAND  Final   Special Requests BOTTLES DRAWN AEROBIC AND ANAEROBIC 5ML  Final   Culture NO GROWTH 5 DAYS  Final   Report Status 12/07/2016 FINAL  Final  Acid Fast Smear (AFB)     Status: None   Collection Time: 12/02/16  9:00 AM  Result Value Ref Range Status   AFB Specimen Processing Concentration  Final   Acid Fast Smear Negative  Final    Comment: (NOTE) Performed At: [facility] [place], [place] [id number] Lindon Romp MD AY:[id number]    Source (AFB) ACID FAST BACILLI  Corrected  CSF culture     Status: None   Collection Time: 12/02/16  2:24 PM  Result Value Ref Range Status   Specimen Description CSF  Final  Special Requests NONE  Final   Gram Stain   Final    CYTOSPIN SMEAR WBC PRESENT,BOTH PMN AND MONONUCLEAR NO ORGANISMS SEEN    Culture NO GROWTH 3 DAYS  Final   Report Status 12/06/2016 FINAL  Final  Culture, fungus without smear     Status: None (Preliminary result)   Collection Time: 12/02/16  2:25 PM  Result Value Ref Range Status   Specimen Description CSF  Final   Special Requests NONE  Final   Culture   Final    NO FUNGUS ISOLATED AFTER 7 DAYS CONTINUING TO HOLD FOR 6 WEEKS PER PHYSICIAN REQUEST   Report Status PENDING  Incomplete  Culture, bal-quantitative      Status: Abnormal   Collection Time: 12/05/16  9:49 AM  Result Value Ref Range Status   Specimen Description BRONCHIAL ALVEOLAR LAVAGE  Final   Special Requests NONE  Final   Gram Stain NO WBC SEEN NO ORGANISMS SEEN  Final   Culture (A)  Final    40,000 COLONIES/mL KLEBSIELLA PNEUMONIAE Confirmed Extended Spectrum Beta-Lactamase Producer (ESBL)    Report Status 12/07/2016 FINAL  Final   Organism ID, Bacteria KLEBSIELLA PNEUMONIAE (A)  Final      Susceptibility   Klebsiella pneumoniae - MIC*    AMPICILLIN >=32 RESISTANT Resistant     CEFAZOLIN >=64 RESISTANT Resistant     CEFEPIME 2 RESISTANT Resistant     CEFTAZIDIME 4 RESISTANT Resistant     CEFTRIAXONE >=64 RESISTANT Resistant     CIPROFLOXACIN 1 SENSITIVE Sensitive     GENTAMICIN >=16 RESISTANT Resistant     IMIPENEM <=0.25 SENSITIVE Sensitive     TRIMETH/SULFA >=320 RESISTANT Resistant     AMPICILLIN/SULBACTAM >=32 RESISTANT Resistant     PIP/TAZO 16 SENSITIVE Sensitive     Extended ESBL POSITIVE Resistant     * 40,000 COLONIES/mL KLEBSIELLA PNEUMONIAE  Pneumocystis smear by DFA     Status: None   Collection Time: 12/05/16  9:49 AM  Result Value Ref Range Status   Specimen Source-PJSRC BRONCHIAL ALVEOLAR LAVAGE  Final   Pneumocystis jiroveci Ag NEGATIVE  Final    Comment: Performed at [facility]  Acid Fast Smear (AFB)     Status: None   Collection Time: 12/05/16  9:49 AM  Result Value Ref Range Status   AFB Specimen Processing Concentration  Final   Acid Fast Smear Negative  Final    Comment: (NOTE) Performed At: [facility] [address] [place], [place] [id number] Lindon Romp MD GT:[id number]    Source (AFB) BRONCHIAL ALVEOLAR LAVAGE  Final  Fungus Culture With Stain     Status: None (Preliminary result)   Collection Time: 12/05/16  9:49 AM  Result Value Ref Range Status   Fungus Stain Final report  Final    Comment: (NOTE) Performed At: [facility] [place], [place] [id number] Lindon Romp MD CW:[id number]    Fungus (Mycology) Culture PENDING  Incomplete   Fungal Source BRONCHIAL ALVEOLAR LAVAGE  Final  Fungus Culture Result     Status: None   Collection Time: 12/05/16  9:49 AM  Result Value Ref Range Status   Result 1 Comment  Final    Comment: (NOTE) KOH/Calcofluor preparation:  no fungus observed. Performed At: [facility] [address] [place], [place] [id number] Lindon Romp MD KJ:[id number]   CSF culture     Status: None   Collection Time: 12/06/16  4:54 PM  Result  Value Ref Range Status   Specimen Description CSF  Final   Special Requests NONE  Final   Gram Stain   Final    WBC PRESENT,BOTH PMN AND MONONUCLEAR NO ORGANISMS SEEN CYTOSPIN    Culture NO GROWTH 3 DAYS  Final   Report Status 12/09/2016 FINAL  Final  Culture, blood (routine x 2)     Status: None (Preliminary result)   Collection Time: 12/08/16  9:37 AM  Result Value Ref Range Status   Specimen Description BLOOD RIGHT ANTECUBITAL  Final   Special Requests BOTTLES DRAWN AEROBIC AND ANAEROBIC 5CC  Final   Culture NO GROWTH 4 DAYS  Final   Report Status PENDING  Incomplete  Culture, blood (routine x 2)     Status: None (Preliminary result)   Collection Time: 12/08/16  9:41 AM  Result Value Ref Range Status   Specimen Description BLOOD BLOOD RIGHT ARM  Final   Special Requests BOTTLES DRAWN AEROBIC AND ANAEROBIC 5CC  Final   Culture NO GROWTH 4 DAYS  Final   Report Status PENDING  Incomplete    Studies/Results: Dg Chest Port 1 View  Result Date: 12/12/2016 CLINICAL DATA:  51 year old male with HIV and suspected disseminated CNS infection. Respiratory failure. Initial encounter. EXAM: PORTABLE CHEST 1 VIEW COMPARISON:  12/09/2016 and earlier. FINDINGS: Portable AP semi upright view at 0443 hours. Endotracheal tube tip between the level the clavicles and carina. Enteric tube courses to the abdomen and side hole projects at the level of the  gastric body. Stable left IJ central line, tip projects at the subclavian and jugular venous confluence. Stable lung volumes. Stable cardiac size and mediastinal contours. Continued dense retrocardiac opacity obscuring the left hemidiaphragm. Mild superimposed bilateral perihilar reticulonodular opacity. No pneumothorax or pulmonary edema. Ventilation has not significantly changed since 12/08/2016. IMPRESSION: 1. Stable lines and tubes. Note that the left IJ central line tip is at the confluence of the left subclavian and jugular veins. 2. Since 12/08/2016 there is Left lower lobe collapse or consolidation with superimposed bilateral perihilar reticulonodular opacity. Favor bilateral pneumonia. Electronically Signed   By: Genevie Ann M.D.   On: 12/12/2016 07:05      Assessment/Plan:  Active Problems:   COPD (chronic obstructive pulmonary disease) (HCC)   Substance abuse   Genital warts   HIV (human immunodeficiency virus infection) ([place])   AIDS (acquired immune deficiency syndrome) ([place])   Noncompliance   Bacterial meningitis   Hypertensive urgency   Septic shock (HCC)   Transaminitis   Cocaine abuse   Open wound   Acute respiratory failure (HCC)   Altered mental status   Acute encephalopathy   Cerebral embolism with cerebral infarction   Endotracheally intubated   Meningoencephalitis   Neurosyphilis   Herpesviral meningitis   Paraplegia ([place])   Acute pulmonary edema ([place])   Lawrence Kane is a 51 y.o. 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 Unclear if CMV also associated though PCR ordered yesterday was not processed due to inadequate sample. Temperature remains around 99-100 F. -Order CMV PCR on CSF sample at [facility] -Continue acyclovir and meropenem.  -Discontinue vancomycin given low suspicion for MRSA. -Defer to Neurology about steroid  therapy given findings of transverse myelitis on MR imaging of the spine  HIV: Continue dolutegravir, emtracitabine-tenofovir, and darunavir/cobicistat by NG tube.   LOS: 12 days   Charlott Rakes 12/13/2016, 1:26 PM

## 2016-12-13 NOTE — Procedures (Signed)
Bronchoscopy  for Percutaneous  Tracheostomy  Name: Lawrence NorthDarren E Kane MRN: [id number] DOB: [birth year] Procedure: Bronchoscopy for Percutaneous Tracheostomy Indications: Diagnostic evaluation of the airways In conjunction with: Dr. Tyson AliasFeinstein   Procedure Details Consent: Risks of procedure as well as the alternatives and risks of each were explained to the (patient/caregiver).  Consent for procedure obtained. Time Out: Verified patient identification, verified procedure, site/side was marked, verified correct patient position, special equipment/implants available, medications/allergies/relevent history reviewed, required imaging and test results available.  Performed  In preparation for procedure, patient was given 100% FiO2 and bronchoscope lubricated. Sedation: Muscle relaxants and Etomidate   Airway entered and carina identified. Procedures performed: Endotracheal Tube retracted in 2 cm increments. Cannulation of airway observed. Dilation observed. No instruments penetrated the posterior wall of trachea.  Placement of trachel tube observed . No overt complications. Bronchoscope removed.    Evaluation Hemodynamic Status: BP stable throughout; O2 sats: stable throughout Patient's Current Condition: stable Specimens:  None Complications: No apparent complications Patient did tolerate procedure well.  Joneen RoachPaul Hoffman, AGACNP-BC [facility] Pager [phone] or [phone]  12/13/2016 4:34 PM     Supervised entire procedure Noted entry of all needles, catheters dilators, no post wall injury Tolerated  Trach bronch through = wnl  Mcarthur Rossettianiel J. Tyson AliasFeinstein, MD, FACP Pgr: [phone] [facility]

## 2016-12-14 ENCOUNTER — Inpatient Hospital Stay (HOSPITAL_COMMUNITY): Payer: Medicaid Other

## 2016-12-14 LAB — MISC LABCORP TEST (SEND OUT): LABCORP TEST CODE: 138693

## 2016-12-14 LAB — GLUCOSE, CAPILLARY
Glucose-Capillary: 136 mg/dL — ABNORMAL HIGH (ref 65–99)
Glucose-Capillary: 146 mg/dL — ABNORMAL HIGH (ref 65–99)
Glucose-Capillary: 147 mg/dL — ABNORMAL HIGH (ref 65–99)
Glucose-Capillary: 153 mg/dL — ABNORMAL HIGH (ref 65–99)
Glucose-Capillary: 176 mg/dL — ABNORMAL HIGH (ref 65–99)

## 2016-12-14 MED ORDER — ORAL CARE MOUTH RINSE
15.0000 mL | Freq: Four times a day (QID) | OROMUCOSAL | Status: DC
  Administered 2016-12-15 – 2017-01-04 (×73): 15 mL via OROMUCOSAL

## 2016-12-14 MED ORDER — CHLORHEXIDINE GLUCONATE 0.12% ORAL RINSE (MEDLINE KIT)
15.0000 mL | Freq: Two times a day (BID) | OROMUCOSAL | Status: DC
  Administered 2016-12-15 – 2017-01-04 (×31): 15 mL via OROMUCOSAL

## 2016-12-14 MED ORDER — CLONAZEPAM 1 MG PO TABS
2.0000 mg | ORAL_TABLET | Freq: Three times a day (TID) | ORAL | Status: DC
  Administered 2016-12-14 – 2016-12-23 (×25): 2 mg
  Filled 2016-12-14 (×27): qty 2

## 2016-12-14 MED ORDER — CLONAZEPAM 0.1 MG/ML ORAL SUSPENSION
2.0000 mg | Freq: Three times a day (TID) | ORAL | Status: DC
Start: 2016-12-14 — End: 2016-12-14
  Filled 2016-12-14 (×2): qty 20

## 2016-12-14 MED ORDER — SULFAMETHOXAZOLE-TRIMETHOPRIM 200-40 MG/5ML PO SUSP
20.0000 mL | Freq: Every day | ORAL | Status: DC
  Administered 2016-12-14 – 2016-12-27 (×9): 20 mL
  Filled 2016-12-14 (×14): qty 20

## 2016-12-14 MED ORDER — MIDAZOLAM HCL 2 MG/2ML IJ SOLN
2.0000 mg | INTRAMUSCULAR | Status: DC | PRN
  Administered 2016-12-15: 2 mg via INTRAVENOUS

## 2016-12-14 MED ORDER — FENTANYL CITRATE (PF) 100 MCG/2ML IJ SOLN
50.0000 ug | INTRAMUSCULAR | Status: DC | PRN
  Administered 2016-12-14 – 2016-12-28 (×32): 50 ug via INTRAVENOUS
  Filled 2016-12-14 (×32): qty 2

## 2016-12-14 MED ORDER — FENTANYL 50 MCG/HR TD PT72
50.0000 ug | MEDICATED_PATCH | TRANSDERMAL | Status: DC
  Administered 2016-12-14 – 2016-12-26 (×5): 50 ug via TRANSDERMAL
  Filled 2016-12-14 (×5): qty 1

## 2016-12-14 MED ORDER — LACTULOSE 10 GM/15ML PO SOLN
45.0000 g | Freq: Three times a day (TID) | ORAL | Status: DC
  Administered 2016-12-14 – 2016-12-16 (×5): 45 g
  Filled 2016-12-14 (×8): qty 90

## 2016-12-14 MED ORDER — LACTULOSE 10 GM/15ML PO SOLN
45.0000 g | Freq: Three times a day (TID) | ORAL | Status: DC
  Filled 2016-12-14 (×3): qty 90

## 2016-12-14 NOTE — Progress Notes (Signed)
   INFECTIOUS DISEASE ATTENDING ADDENDUM:   Date: 12/13/2016  Patient name: Lawrence Kane        Medical record number: [id number]            Date of birth: 07-01-1966           This patient has been seen and discussed with the Dr. Allena KatzPatel. Please see the resident's note for complete details. I concur with his  findings with the following additions/corrections:  This patient has received 14 days of antibacterial therapy including 7 days for coverage of ESBL in the lungs  We can now DC the vancomycin as there is no evidence of an epidural abscess, and suspicion for a bacterial pathogen in the central nervous system is low and especially low for something that would require vancomycin such as methicillin-resistant staph aureus or a resistant pneumococcus  I would also advocate for stopping the meropenem in the next few days  Would continue acyclovir  I think this patients picture is consistent with 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  With regards to his HIV he is well controlled I think his CD4 dropped in the setting of this acute severe infection and illness  I would continue antiretrovirals and institute PCP prophylaxis with daily Bactrim  Lawrence Kane 12/13/2016, 5:47 PM

## 2016-12-14 NOTE — Progress Notes (Signed)
CSW engaged with Patient's family via T/C to discuss Vent/Trach SNF placement. CSW explained to family that CSW will make referrals but notes that Patient may likely be placed out of state due to limited beds in [place]. Patient's family requesting Vent SNF placement in [place] as they live in [place], [place]. CSW provided Patient's family with name of Vent SNF facilities. CSW also began explaining that if Patient is placed in [place], family will need to [place]. [place]'s family. CSW explained that Patient will have to go with the first available bed once medically cleared for discharge but CSW will definitely note family's first choice in Vent/Trach SNF should bed become available prior to Patient discharge. Patient's family agreeable at this time. CSW continues to follow.    Enos FlingAshley Carey Lafon, MSW, LCSW [facility] Clinical Social Worker [phone]

## 2016-12-14 NOTE — Progress Notes (Addendum)
PULMONARY / CRITICAL CARE MEDICINE   Name: Lawrence Kane MRN: [id number] DOB: Mar 19, 1966    ADMISSION DATE:  12/01/2016 CONSULTATION DATE:  12/01/2016  REFERRING MD:  [name] transfer  CHIEF COMPLAINT:  AMS  HISTORY OF PRESENT ILLNESS:   51 year old man with HIV/AIDS (first diagnosed 2003, last CD4 count 245 with viremia of 1,160 in October 2017; on Genovya, Darunavir and Bactrim), Genital Warts, COPD, and cocaine substance abuse who presented to [facility] on 1/24 with complaint of altered mental status. Per notes, patient may have recently had some type of surgery in [place] where his mother resides. Afebrile, tachycardic to 155, RR 18, hypertensive to 240/112. Patient was intubated for airway protection, CVL in IJ was placed, and LP was performed given his altered mental status. He was started on Versed and propofol for sedation. Initial labs showed leukocytosis of 13.9, hemoglobin 10.9, hct 33.6 and plts 269. BMET showed Sodium 139, K 3.9, Cl 101, Bicarb 21, BUN 23, Creatine 1.10, glucose 163. ABG showed pH of 7.28, pCO2 48, pO2 158, on FiO2 of 40%, PEEP 5, TV 450 and RR 18. Lactic Acid 3.3, 6.9. AST 125, ALT 224, and Alk Phos 88. UA negative for infection. UDS positive for cocaine. CSF appeared yellow, cloudy, xanthochromic, WBC 4845, RBC 625, glucose <20, total protein >300, Neutrophils 16%, Eos 64%, monocytes 17%, lymphocytes 3%. Gram stain performed, but unable to view results. ID was consulted. Dr. Baxter Flattery recommended continuing Truvada and Tivicay through NGT  SUBJECTIVE:  Trach placed, tolerated fine  VITAL SIGNS: BP 116/76   Pulse 77   Temp (!) 100.4 F (38 C) (Oral) Comment: RN Linda aware   Resp 15   Ht _0  (1.93 m)   Wt 103.5 kg (228 lb 2.8 oz)   SpO2 97%   BMI 27.77 kg/m   VENTILATOR SETTINGS: Vent Mode: PRVC FiO2 (%):  [30 %-100 %] 30 % Set Rate:  [16 bmp-20 bmp] 16 bmp Vt Set:  [620 mL] 620 mL PEEP:  [5 cmH20] 5 cmH20 Plateau Pressure:  [16 cmH20-18  cmH20] 17 cmH20  INTAKE / OUTPUT: I/O last 3 completed shifts: In: 3705.5 [I.V.:1171.7; NG/GT:1383.8; IV Piggyback:1150] Out: 5400 [Urine:5400]  PHYSICAL EXAMINATION: General: sedated half dose rass 0 fc Neuro:follows commands well, rass 0  HEENT: jvd, ett PULM: ronchi, some secretions CV: s1 s2 RRR no r GI: soft, BS low, no r Extremities: edema   LABS:  BMET  Recent Labs Lab 12/11/16 0330 12/12/16 0319 12/13/16 0219  NA 133* 133* 136  K 4.5 5.2* 4.1  CL 93* 90* 97*  CO2 33* 34* 35*  BUN 25* 23* 24*  CREATININE 0.97 0.83 0.82  GLUCOSE 211* 245* 223*    Electrolytes  Recent Labs Lab 12/11/16 0330 12/12/16 0319 12/13/16 0219  CALCIUM 8.2* 8.1* 8.4*  MG 1.9 1.7 2.0  PHOS 3.6 3.2 2.6    CBC  Recent Labs Lab 12/11/16 0330 12/12/16 0319 12/13/16 0219  WBC 9.6 12.3* 10.5  HGB 8.5* 9.0* 8.4*  HCT 27.2* 29.0* 26.7*  PLT 286 356 321    Coag's  Recent Labs Lab 12/13/16 1433  APTT 35  INR 0.99    Sepsis Markers No results for input(s): LATICACIDVEN, PROCALCITON, O2SATVEN in the last 168 hours.  ABG  Recent Labs Lab 12/08/16 2325  PHART 7.424  PCO2ART 52.5*  PO2ART 78.3*    Liver Enzymes  Recent Labs Lab 12/11/16 0330 12/12/16 0319 12/13/16 0219  AST 322* 197* 126*  ALT 391* 350* 248*  ALKPHOS 94 101 93  BILITOT 0.9 0.9 0.7  ALBUMIN 1.6* 1.8* 1.7*    Cardiac Enzymes No results for input(s): TROPONINI, PROBNP in the last 168 hours.  Glucose  Recent Labs Lab 12/13/16 1156 12/13/16 1510 12/13/16 2029 12/13/16 2328 12/14/16 0429 12/14/16 0820  GLUCAP 196* 111* 138* 142* 136* 146*    Imaging Dg Chest Port 1 View  Result Date: 12/13/2016 CLINICAL DATA:  Respiratory failure EXAM: PORTABLE CHEST 1 VIEW COMPARISON:  12/12/2016 FINDINGS: Tracheostomy tube is noted. Left jugular central line is again seen and stable. The nasogastric catheter has been removed. Left retrocardiac density is noted consistent with  atelectasis/infiltrate. Small left pleural effusion is noted. Mild vascular congestion is noted. IMPRESSION: Stable left retrocardiac infiltrate and small effusion. Mild vascular congestion is seen. Electronically Signed   By: Inez Catalina M.D.   On: 12/13/2016 17:10     STUDIES:  1/24 CXR >> ATX vs LLL infiltrate 1/25 CT head >> neg for acute intracranial process 1/25 LP cytology >> no malignant cells 1/25 LP >> yellow, cloudy, glucose 20, total protein >600, RBC 114>49, WBC 970, 36% neutrophil, 20% lymphs, 19% eos 1/28 BAL cell count >> 266 WBC, eos 1 1/28 BAL cytology >> no malignant cells 1/27 BAL for wet mount> neg for parasites 1/27 MRI brain >> small acute infarcts in cerebellum and cerebral cortex, sucal signal 1/29 LP >> Glucose 82, protein >600, WBC 600>885, eosinophils 28% 1/29 Echo >> LV EF 60-65%, indeterminent diastolic function, wall motion normal 1/30 EEG >> diffuse slowing and background suppression, no seizures 2/3 MRI thoracic/lumbar/cervical >> (-) abscess. Meningitis. R/O CMV   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 1/24 Cryptococcal [name]): negative 1/24 CSF [facility]): NGTD 1/22 scrotal wound cx [name]): MRSA Toxoplasma [name]): Negative 1/25 BCx x2 : NGTD 1/25 HCV ab + 1/24 Trach asp 1/24: NGTD 1/24 MRSA PCR: MRSA pos 1/25 HIV quant: 80, CD4 10 1/25 CSF AFB: Smear neg, >> 1/25 CSF fungal cx: NGTD 1/25 CSF crypto ag: neg 1/25 CSF gram stain no organisms, cx: NGTD 1/27 strongyloides ab >> negative 1/27 coccidiomycosis ab >> 1/28 BAL AFB smear/cx> 1/28 BAL Fungus cx >> 1/28 BAL gram stain/Cx: 40K ESBL Klebsiella pneumoniae 1/28 BAL pneumocystis smear: Neg 1/27 O&P 1/29 CSF Cx >> no organisms on gram stain, >> NGTD 2/4   CSF CMV >>   ANTIBIOTICS: Rocephin 1/24>>1/30 Vancomycin 1/24>>1/30; 2/2 >  Acyclovir 1/24>> 1/26, 1/27>> Ampicillin 1/24>> 1/27 Bactrim M/W/F for PCP prophylaxis Meropenem  1/30>> 2/1; 2/2 >  Pen G 2/1 >> 2/2 Cipro 2/1 >> 2/2  SIGNIFICANT EVENTS: 1/24> Transferred from [facility] 1/25> Underwent LP here  LINES/TUBES: L IJ CVL 1/24 >> ETT 1/24 >>2/5 Foley 1/24 >> OGT 1/24 >>2/5 Trach (df) 2/5>>>  DISCUSSION: 51 year old man with HIV/AIDS (first diagnosed 2003, last CD4 count 245 with viremia of 1,160 in October 2017; on Genovya, Darunavir and Bactrim), Genital Warts, COPD, and cocaine substance abuse who presented to [facility] on 1/24 with altered mental status. Intubated for airway protection.   ASSESSMENT / PLAN:  PULMONARY A: Acute Hypoxemic Respiratory Failure 2/2 HCAP ESBL Klebsiella LLL+ sepsis + Unable to protect airway + pulm edema S/p trach 2/5 P:   Neg balance as tolerated Trach collar trial goal 4 hours- 6 hours pcxr in am   CARDIOVASCULAR A:  Sinus Tachycardia- resolved Incomplete RBBB HTN Pulm edema P:  Hydralazine prn Cont lasix same dose  RENAL A:   Pulm edema P:  Chem in am  kvo Lasix 20 mg IV BID maintain  GASTROINTESTINAL A:   Transaminitis, (worsened by seroquel likely which was discontinued on 1/31). better HCV ab+, Chronic hep b P:   Place feeding tube and start Follow LFTs q48 hr tilll resolution Famotidine 20 mg IV BID Need BM, add lactulose  HEMATOLOGIC A:   Mild leukocytosis > Resolved Mild anemia P:  Sub Q heparin for VTE ppx  INFECTIOUS A:   Meningitis, bacterial + viral. 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 P:   Continue fentanyl drip - dc  Propofol drip-off dc from mar Consider fent patch Cont clonazepam 2 mg TID Add prn versed  RASS goal: 0 to -1 ASA daily Will discuss role steroids for spinal cord  FAMILY  - Updates: Mother updated via the phone on 2/4 and discussed with her over all prognosis.  She wants everything done. She agrees to a trache.  - Inter-disciplinary family meet or Palliative Care meeting due by:  2/7  Ccm time 30 min   Lavon Paganini. Titus Mould, MD, [place] Pgr: [facility]

## 2016-12-14 NOTE — Progress Notes (Signed)
Patient is a 51 YO male with HIV/AIDS, genital warts, COPD, and cocaine abuse. S/P tracheostomy yesterday. CSW following for substance abuse resources once extubated and medically appropriate.    Enos FlingAshley Erskin Zinda, MSW, LCSW [facility] Clinical Social Worker [phone]

## 2016-12-14 NOTE — Progress Notes (Signed)
Subjective: This morning, he was able to open his eyes and follow some simple commands. Now s/p tracheostomy  Antibiotics:  Anti-infectives    Start     Dose/Rate Route Frequency Ordered Stop   12/14/16 1515  sulfamethoxazole-trimethoprim (BACTRIM,SEPTRA) 200-40 MG/5ML suspension 20 mL     20 mL Per Tube Daily 12/14/16 1508     12/13/16 1800  sulfamethoxazole-trimethoprim (BACTRIM DS,SEPTRA DS) 800-160 MG per tablet 1 tablet  Status:  Discontinued     1 tablet Oral Daily 12/13/16 1753 12/14/16 1508   12/12/16 1800  acyclovir (ZOVIRAX) 1,000 mg in sodium chloride 0.9 % 150 mL injection     150 mL/hr over 60 Minutes Intravenous Every 8 hours 12/12/16 1410     12/10/16 1400  meropenem (MERREM) 2 g in sodium chloride 0.9 % 100 mL IVPB  Status:  Discontinued     2 g 200 mL/hr over 30 Minutes Intravenous Every 8 hours 12/10/16 1140 12/14/16 1506   12/10/16 1300  vancomycin (VANCOCIN) 1,250 mg in sodium chloride 0.9 % 250 mL IVPB  Status:  Discontinued     1,250 mg 166.7 mL/hr over 90 Minutes Intravenous Every 8 hours 12/10/16 1140 12/13/16 0824   12/09/16 1000  penicillin G potassium 4 Million Units in dextrose 5 % 250 mL IVPB  Status:  Discontinued     4 Million Units 250 mL/hr over 60 Minutes Intravenous Every 4 hours 12/09/16 0851 12/10/16 1109   12/09/16 1000  ciprofloxacin (CIPRO) IVPB 400 mg  Status:  Discontinued     400 mg 200 mL/hr over 60 Minutes Intravenous Every 12 hours 12/09/16 0929 12/10/16 1109   12/07/16 2245  meropenem (MERREM) 2 g in sodium chloride 0.9 % 100 mL IVPB  Status:  Discontinued     2 g 200 mL/hr over 30 Minutes Intravenous Every 8 hours 12/07/16 2236 12/09/16 0851   12/04/16 2200  vancomycin (VANCOCIN) 1,250 mg in sodium chloride 0.9 % 250 mL IVPB  Status:  Discontinued     1,250 mg 166.7 mL/hr over 90 Minutes Intravenous Every 8 hours 12/04/16 1421 12/07/16 1057   12/04/16 1000  acyclovir (ZOVIRAX) 1,000 mg in dextrose 5 % 150 mL IVPB  Status:   Discontinued     1,000 mg 170 mL/hr over 60 Minutes Intravenous Every 8 hours 12/04/16 0943 12/12/16 1409   12/03/16 0900  acyclovir (ZOVIRAX) 1,080 mg in dextrose 5 % 250 mL IVPB  Status:  Discontinued     1,080 mg 271.6 mL/hr over 60 Minutes Intravenous Every 8 hours 12/03/16 0651 12/03/16 0843   12/03/16 0900  acyclovir (ZOVIRAX) 1,000 mg in dextrose 5 % 150 mL IVPB  Status:  Discontinued     1,000 mg 170 mL/hr over 60 Minutes Intravenous Every 8 hours 12/03/16 0843 12/03/16 1002   12/02/16 1015  darunavir-cobicistat (PREZCOBIX) 800-150 MG per tablet 1 tablet     1 tablet Oral Daily with breakfast 12/02/16 1003     12/02/16 1000  emtricitabine-tenofovir AF (DESCOVY) 200-25 MG per tablet 1 tablet     1 tablet Per Tube Daily 12/01/16 2320     12/02/16 1000  dolutegravir (TIVICAY) tablet 50 mg     50 mg Oral Daily 12/01/16 2320     12/02/16 1000  vancomycin (VANCOCIN) 1,250 mg in sodium chloride 0.9 % 250 mL IVPB  Status:  Discontinued     1,250 mg 166.7 mL/hr over 90 Minutes Intravenous Every 24 hours 12/02/16 0010 12/02/16 0626  12/02/16 0900  acyclovir (ZOVIRAX) 930 mg in dextrose 5 % 150 mL IVPB  Status:  Discontinued     10 mg/kg  93 kg 168.6 mL/hr over 60 Minutes Intravenous Every 8 hours 12/02/16 0632 12/03/16 0651   12/02/16 0700  vancomycin (VANCOCIN) IVPB 1000 mg/200 mL premix  Status:  Discontinued     1,000 mg 200 mL/hr over 60 Minutes Intravenous Every 8 hours 12/02/16 0634 12/04/16 1421   12/02/16 0400  cefTRIAXone (ROCEPHIN) 2 g in dextrose 5 % 50 mL IVPB  Status:  Discontinued     2 g 100 mL/hr over 30 Minutes Intravenous Every 12 hours 12/02/16 0010 12/07/16 2222   12/02/16 0100  acyclovir (ZOVIRAX) 1,110 mg in dextrose 5 % 250 mL IVPB  Status:  Discontinued     10 mg/kg  111.1 kg 272.2 mL/hr over 60 Minutes Intravenous Every 8 hours 12/02/16 0010 12/02/16 0632   12/02/16 0100  sulfamethoxazole-trimethoprim (BACTRIM,SEPTRA) 200-40 MG/5ML suspension 20 mL  Status:   Discontinued     20 mL Per Tube Once per day on Mon Wed Fri 12/02/16 0010 12/06/16 1416   12/02/16 0000  ampicillin (OMNIPEN) 2 g in sodium chloride 0.9 % 50 mL IVPB  Status:  Discontinued     2 g 150 mL/hr over 20 Minutes Intravenous Every 4 hours 12/01/16 2322 12/04/16 0941      Medications: Scheduled Meds: . small volume/piggyback builder   Intravenous Q8H  . aspirin  325 mg Per Tube Daily  . chlorhexidine gluconate (MEDLINE KIT)  15 mL Mouth Rinse BID  . clonazePAM  2 mg Per Tube TID  . darunavir-cobicistat  1 tablet Oral Q breakfast  . dolutegravir  50 mg Oral Daily  . emtricitabine-tenofovir AF  1 tablet Per Tube Daily  . famotidine  20 mg Per Tube BID  . fentaNYL  50 mcg Transdermal Q72H  . fentaNYL (SUBLIMAZE) injection  200 mcg Intravenous Once  . fentaNYL (SUBLIMAZE) injection  50 mcg Intravenous Once  . furosemide  20 mg Intravenous Q12H  . heparin  5,000 Units Subcutaneous Q8H  . insulin aspart  0-20 Units Subcutaneous Q4H  . insulin aspart  4 Units Subcutaneous Q4H  . insulin glargine  30 Units Subcutaneous Q24H  . lactulose  45 g Per Tube TID  . mouth rinse  15 mL Mouth Rinse 10 times per day  . polyethylene glycol  17 g Per Tube Daily  . propofol  5-80 mcg/kg/min Intravenous Once  . sulfamethoxazole-trimethoprim  20 mL Per Tube Daily   Continuous Infusions: . dextrose    . feeding supplement (VITAL AF 1.2 CAL) Stopped (12/13/16 0935)   PRN Meds:.sodium chloride, acetaminophen, chlorproMAZINE (THORAZINE) IV, dextrose, docusate, fentaNYL (SUBLIMAZE) injection, hydrALAZINE, ibuprofen, midazolam, midazolam, sennosides, sodium chloride flush    Objective: Weight change: -6 lb 13.4 oz (-3.1 kg)  Intake/Output Summary (Last 24 hours) at 12/14/16 1805 Last data filed at 12/14/16 1600  Gross per 24 hour  Intake           510.43 ml  Output             2515 ml  Net         -2004.57 ml   Blood pressure (!) 159/84, pulse 83, temperature 99.2 F (37.3 C),  temperature source Oral, resp. rate (!) 22, height _0  (1.93 m), weight 228 lb 2.8 oz (103.5 kg), SpO2 99 %. Temp:  [98 F (36.7 C)-101 F (38.3 C)] 99.2 F (37.3 C) (02/06 1554)  Pulse Rate:  [66-90] 83 (02/06 1744) Resp:  [15-35] 22 (02/06 1744) BP: (116-159)/(66-111) 159/84 (02/06 1600) SpO2:  [95 %-100 %] 99 % (02/06 1744) FiO2 (%):  [30 %-40 %] 40 % (02/06 1743) Weight:  [228 lb 2.8 oz (103.5 kg)] 228 lb 2.8 oz (103.5 kg) (02/06 0125)  Physical Exam: General: somewhat alert, no distress HEENT: PERRL, EOMI, no scleral icterus, trach collar in place Cardiac: RRR, no rubs, murmurs or gallops Pulm: clear to auscultation bilaterally in the anterior lung fields, no wheezes, rales, or rhonchi Abd: soft, nontender, nondistended, BS present Ext: LLE covered in boots bilaterally Neuro: able to open/close his eyes on command though unable to wiggle his toes, moves upper extremities spontaneously  BMET  Recent Labs  12/12/16 0319 12/13/16 0219  NA 133* 136  K 5.2* 4.1  CL 90* 97*  CO2 34* 35*  GLUCOSE 245* 223*  BUN 23* 24*  CREATININE 0.83 0.82  CALCIUM 8.1* 8.4*     Liver Panel   Recent Labs  12/12/16 0319 12/13/16 0219  PROT 8.4* 7.6  ALBUMIN 1.8* 1.7*  AST 197* 126*  ALT 350* 248*  ALKPHOS 101 93  BILITOT 0.9 0.7       Sedimentation Rate No results for input(s): ESRSEDRATE in the last 72 hours. C-Reactive Protein No results for input(s): CRP in the last 72 hours.  Micro Results: Recent Results (from the past 720 hour(s))  MRSA PCR Screening     Status: Abnormal   Collection Time: 12/01/16 10:26 PM  Result Value Ref Range Status   MRSA by PCR POSITIVE (A) NEGATIVE Final    Comment:        The GeneXpert MRSA Assay (FDA approved for NASAL specimens only), is one component of a comprehensive MRSA colonization surveillance program. It is not intended to diagnose MRSA infection nor to guide or monitor treatment for MRSA infections. RESULT CALLED  TO, READ BACK BY AND VERIFIED WITH: R DONAHUE,RN _0  12/02/16 MKELLY,MLT   Culture, respiratory (tracheal aspirate)     Status: None   Collection Time: 12/01/16 11:05 PM  Result Value Ref Range Status   Specimen Description TRACHEAL ASPIRATE  Final   Special Requests NONE  Final   Gram Stain   Final    FEW WBC PRESENT,BOTH PMN AND MONONUCLEAR NO ORGANISMS SEEN    Culture Consistent with normal respiratory flora.  Final   Report Status 12/04/2016 FINAL  Final  Culture, blood (routine x 2)     Status: None   Collection Time: 12/02/16 12:00 AM  Result Value Ref Range Status   Specimen Description BLOOD LEFT HAND  Final   Special Requests AEROBIC BOTTLE ONLY 10ML  Final   Culture NO GROWTH 5 DAYS  Final   Report Status 12/07/2016 FINAL  Final  Culture, blood (routine x 2)     Status: None   Collection Time: 12/02/16 12:04 AM  Result Value Ref Range Status   Specimen Description BLOOD RIGHT HAND  Final   Special Requests BOTTLES DRAWN AEROBIC AND ANAEROBIC 5ML  Final   Culture NO GROWTH 5 DAYS  Final   Report Status 12/07/2016 FINAL  Final  Acid Fast Smear (AFB)     Status: None   Collection Time: 12/02/16  9:00 AM  Result Value Ref Range Status   AFB Specimen Processing Concentration  Final   Acid Fast Smear Negative  Final    Comment: (NOTE) Performed At: [facility] [address] [place], [place] [id number] Darrel Hoover  F MD JJ:[id number]    Source (AFB) ACID FAST BACILLI  Corrected  CSF culture     Status: None   Collection Time: 12/02/16  2:24 PM  Result Value Ref Range Status   Specimen Description CSF  Final   Special Requests NONE  Final   Gram Stain   Final    CYTOSPIN SMEAR WBC PRESENT,BOTH PMN AND MONONUCLEAR NO ORGANISMS SEEN    Culture NO GROWTH 3 DAYS  Final   Report Status 12/06/2016 FINAL  Final  Culture, fungus without smear     Status: None (Preliminary result)   Collection Time: 12/02/16  2:25 PM  Result Value Ref Range Status    Specimen Description CSF  Final   Special Requests NONE  Final   Culture   Final    NO FUNGUS ISOLATED AFTER 7 DAYS CONTINUING TO HOLD FOR 6 WEEKS PER PHYSICIAN REQUEST   Report Status PENDING  Incomplete  Culture, bal-quantitative     Status: Abnormal   Collection Time: 12/05/16  9:49 AM  Result Value Ref Range Status   Specimen Description BRONCHIAL ALVEOLAR LAVAGE  Final   Special Requests NONE  Final   Gram Stain NO WBC SEEN NO ORGANISMS SEEN  Final   Culture (A)  Final    40,000 COLONIES/mL KLEBSIELLA PNEUMONIAE Confirmed Extended Spectrum Beta-Lactamase Producer (ESBL)    Report Status 12/07/2016 FINAL  Final   Organism ID, Bacteria KLEBSIELLA PNEUMONIAE (A)  Final      Susceptibility   Klebsiella pneumoniae - MIC*    AMPICILLIN >=32 RESISTANT Resistant     CEFAZOLIN >=64 RESISTANT Resistant     CEFEPIME 2 RESISTANT Resistant     CEFTAZIDIME 4 RESISTANT Resistant     CEFTRIAXONE >=64 RESISTANT Resistant     CIPROFLOXACIN 1 SENSITIVE Sensitive     GENTAMICIN >=16 RESISTANT Resistant     IMIPENEM <=0.25 SENSITIVE Sensitive     TRIMETH/SULFA >=320 RESISTANT Resistant     AMPICILLIN/SULBACTAM >=32 RESISTANT Resistant     PIP/TAZO 16 SENSITIVE Sensitive     Extended ESBL POSITIVE Resistant     * 40,000 COLONIES/mL KLEBSIELLA PNEUMONIAE  Pneumocystis smear by DFA     Status: None   Collection Time: 12/05/16  9:49 AM  Result Value Ref Range Status   Specimen Source-PJSRC BRONCHIAL ALVEOLAR LAVAGE  Final   Pneumocystis jiroveci Ag NEGATIVE  Final    Comment: Performed at [place] [facility]  Acid Fast Smear (AFB)     Status: None   Collection Time: 12/05/16  9:49 AM  Result Value Ref Range Status   AFB Specimen Processing Concentration  Final   Acid Fast Smear Negative  Final    Comment: (NOTE) Performed At: [facility] [address] [place], [place] [id number] Lindon Romp MD FW:[id number]    Source (AFB) BRONCHIAL ALVEOLAR LAVAGE  Final  Fungus  Culture With Stain     Status: None (Preliminary result)   Collection Time: 12/05/16  9:49 AM  Result Value Ref Range Status   Fungus Stain Final report  Final    Comment: (NOTE) Performed At: [facility] [place], [place] [id number] Lindon Romp MD [id number]    Fungus (Mycology) Culture PENDING  Incomplete   Fungal Source BRONCHIAL ALVEOLAR LAVAGE  Final  Fungus Culture Result     Status: None   Collection Time: 12/05/16  9:49 AM  Result Value Ref Range Status   Result 1 Comment  Final  Comment: (NOTE) KOH/Calcofluor preparation:  no fungus observed. Performed At: [facility] [place], [place] [id number] Lindon Romp MD NO:[id number]   CSF culture     Status: None   Collection Time: 12/06/16  4:54 PM  Result Value Ref Range Status   Specimen Description CSF  Final   Special Requests NONE  Final   Gram Stain   Final    WBC PRESENT,BOTH PMN AND MONONUCLEAR NO ORGANISMS SEEN CYTOSPIN    Culture NO GROWTH 3 DAYS  Final   Report Status 12/09/2016 FINAL  Final  Culture, blood (routine x 2)     Status: None   Collection Time: 12/08/16  9:37 AM  Result Value Ref Range Status   Specimen Description BLOOD RIGHT ANTECUBITAL  Final   Special Requests BOTTLES DRAWN AEROBIC AND ANAEROBIC 5CC  Final   Culture NO GROWTH 5 DAYS  Final   Report Status 12/13/2016 FINAL  Final  Culture, blood (routine x 2)     Status: None   Collection Time: 12/08/16  9:41 AM  Result Value Ref Range Status   Specimen Description BLOOD BLOOD RIGHT ARM  Final   Special Requests BOTTLES DRAWN AEROBIC AND ANAEROBIC 5CC  Final   Culture NO GROWTH 5 DAYS  Final   Report Status 12/13/2016 FINAL  Final    Studies/Results: Dg Chest Port 1 View  Result Date: 12/13/2016 CLINICAL DATA:  Respiratory failure EXAM: PORTABLE CHEST 1 VIEW COMPARISON:  12/12/2016 FINDINGS: Tracheostomy tube is noted. Left jugular central line is again seen and stable. The  nasogastric catheter has been removed. Left retrocardiac density is noted consistent with atelectasis/infiltrate. Small left pleural effusion is noted. Mild vascular congestion is noted. IMPRESSION: Stable left retrocardiac infiltrate and small effusion. Mild vascular congestion is seen. Electronically Signed   By: Inez Catalina M.D.   On: 12/13/2016 17:10   Dg Abd Portable 1v  Result Date: 12/14/2016 CLINICAL DATA:  Nasogastric tube placement. EXAM: PORTABLE ABDOMEN - 1 VIEW COMPARISON:  12/07/2016 FINDINGS: Nasogastric tube enters the stomach in has its tip at the proximal fourth portion of the duodenum. Upper abdominal bowel gas pattern is unremarkable. Atelectasis persists at the left base. IMPRESSION: Nasogastric tube tip at the proximal fourth portion of the duodenum. Electronically Signed   By: Nelson Chimes M.D.   On: 12/14/2016 16:26      Assessment/Plan:  Active Problems:   COPD (chronic obstructive pulmonary disease) (HCC)   Substance abuse   Genital warts   HIV (human immunodeficiency virus infection) ([place])   AIDS (acquired immune deficiency syndrome) ([place])   Noncompliance   Bacterial meningitis   Hypertensive urgency   Septic shock (HCC)   Transaminitis   Cocaine abuse   Open wound   Acute respiratory failure (HCC)   Altered mental status   Acute encephalopathy   Cerebral embolism with cerebral infarction   Endotracheally intubated   Meningoencephalitis   Neurosyphilis   Herpesviral meningitis   Paraplegia (HCC)   Acute pulmonary edema (HCC)   Transverse myelitis ([place])   Encounter for orogastric (OG) tube placement   Somnolence  Lawrence Kane is a 51 y.o. 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 Afebrile overnight.  -Continue acyclovir and discontinue meropenem given adequate coverage for ESBL -Follow-up CMV PCR. Unlikely it is  related. -Recommend steroids given findings of transverse myelitis on imaging but will defer to Neurology  HIV: Continue  dolutegravir, emtracitabine-tenofovir, darunavir/cobicistat, SMP/TMX [PCP prophylaxis].   LOS: 13 days   Charlott Rakes 12/14/2016, 6:05 PM

## 2016-12-14 NOTE — Care Management Note (Signed)
Case Management Note  Patient Details  Name: Lawrence Kane MRN: [id number] Date of Birth: February 03, 1966  Subjective/Objective:     Pt admitted with bacterial menegitis               Action/Plan: PTA from home - pt remains ventilated. Bacterial meningitis, possible parasitic given elevated eosinophils, also would consider coccidiodes. Possible LLL PNA, HIV > CD4 10  . Plan is to keep pt on full support until Monday 12/06/16 to allow for possible recovery - tentative GOC meeting scheduled Monday.  Mom and sister are POC.  CM will continue to follow for discharge needs   Expected Discharge Date:                  Expected Discharge Plan:     In-House Referral:  Clinical Social Work  Discharge planning Services  CM Consult  Post Acute Care Choice:    Choice offered to:     DME Arranged:    DME Agency:     HH Arranged:    HH Agency:     Status of Service:  In process, will continue to follow  If discussed at Long Length of Stay Meetings, dates discussed:    Additional Comments: Discussed in LOS 12/14/16 - pt remains appropriate for continued stay.  Pt was trached yesterday - remains ventilated via trach.  Discharge more probable SNF as pt is not eligible for LTACH due to insurance coverage and lack of support in the home.  Pts support system includes mom and sister.  CSW consulted for potential discharge to SNF [name], Cherity Blickenstaff S, RN 12/14/2016, 12:07 PM

## 2016-12-14 NOTE — Progress Notes (Signed)
Subjective: Interval History:  Patient presented with altered mental status on January 24 and had extensive workup done. 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 He has history of HIV and currently on HAART therapy. 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  No acute events overnight  Objective: Vital signs in last 24 hours: Temp:  [98 F (36.7 C)-101 F (38.3 C)] 101 F (38.3 C) (02/06 1206) Pulse Rate:  [66-87] 87 (02/06 1100) Resp:  [15-26] 18 (02/06 1100) BP: (114-149)/(66-111) 146/111 (02/06 1100) SpO2:  [95 %-100 %] 95 % (02/06 1100) FiO2 (%):  [30 %-100 %] 40 % (02/06 1100) Weight:  [103.5 kg (228 lb 2.8 oz)] 103.5 kg (228 lb 2.8 oz) (02/06 0125)  Intake/Output from previous day: 02/05 0701 - 02/06 0700 In: 2240.5 [I.V.:1081.7; NG/GT:708.8; IV Piggyback:450] Out: 8937 [Urine:3525] Intake/Output this shift: Total I/O In: 81.9 [I.V.:81.9] Out: 390 [Urine:390] Nutritional status: Diet NPO time specified  HEENT-  Normocephalic, no lesions, some nuchal rigidity is still present Cardiovascular - regular rate and rhythm Lungs - nonlabored breathing Abdomen - soft nontender and nondistended  Neurologic Examination: Mental status: Awake and alert. Has tracheostomy tube and unable to verbalize at this time. Cranial nerves: Pupils approximately 3 mm and reactive to light. Extra muscles intact as he tracks around. No facial droop is appreciated. Motor: Only moving distal bilateral upper extremities. No movement was noted in deltoids. Bilateral lower extremity is complete paraplegia. Sensory: Grimaces to noxious ablation throughout   Lab Results:  Recent Labs  12/12/16 0319 12/13/16 0219  WBC 12.3* 10.5  HGB 9.0* 8.4*  HCT 29.0* 26.7*  PLT 356 321  NA 133* 136  K 5.2* 4.1  CL 90* 97*  CO2 34* 35*  GLUCOSE 245* 223*  BUN 23* 24*  CREATININE 0.83 0.82  CALCIUM 8.1* 8.4*   Lipid  Panel  Recent Labs  12/12/16 0319  TRIG 217*    Studies/Results: Dg Chest Port 1 View  Result Date: 12/13/2016 CLINICAL DATA:  Respiratory failure EXAM: PORTABLE CHEST 1 VIEW COMPARISON:  12/12/2016 FINDINGS: Tracheostomy tube is noted. Left jugular central line is again seen and stable. The nasogastric catheter has been removed. Left retrocardiac density is noted consistent with atelectasis/infiltrate. Small left pleural effusion is noted. Mild vascular congestion is noted. IMPRESSION: Stable left retrocardiac infiltrate and small effusion. Mild vascular congestion is seen. Electronically Signed   By: Inez Catalina M.D.   On: 12/13/2016 17:10    Medications:  Scheduled: . small volume/piggyback builder   Intravenous Q8H  . aspirin  325 mg Per Tube Daily  . chlorhexidine gluconate (MEDLINE KIT)  15 mL Mouth Rinse BID  . clonazepam  2 mg Per Tube TID  . darunavir-cobicistat  1 tablet Oral Q breakfast  . dolutegravir  50 mg Oral Daily  . emtricitabine-tenofovir AF  1 tablet Per Tube Daily  . famotidine  20 mg Per Tube BID  . fentaNYL  50 mcg Transdermal Q72H  . fentaNYL (SUBLIMAZE) injection  200 mcg Intravenous Once  . fentaNYL (SUBLIMAZE) injection  50 mcg Intravenous Once  . furosemide  20 mg Intravenous Q12H  . heparin  5,000 Units Subcutaneous Q8H  . insulin aspart  0-20 Units Subcutaneous Q4H  . insulin aspart  4 Units Subcutaneous Q4H  . insulin glargine  30 Units Subcutaneous Q24H  . lactulose  45 g Oral TID  . mouth rinse  15 mL Mouth Rinse 10 times  per day  . polyethylene glycol  17 g Per Tube Daily  . propofol  5-80 mcg/kg/min Intravenous Once  . sulfamethoxazole-trimethoprim  20 mL Per Tube Daily   Continuous: . dextrose    . 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 I reviewed these images with neuroradiology and there is significant inflammatory component in his thoracic and lumbar spine. He is currently being treated with IV antibiotics and I do believe a trial of high-dose steroids can be beneficial.   -Typical dose for transverse myelitis is methylprednisolone 1000 mg daily for 5 days.   LOS: 13 days   Rochele Raring

## 2016-12-14 NOTE — Progress Notes (Addendum)
   INFECTIOUS DISEASE ATTENDING ADDENDUM:   Date: 12/14/16  Patient name: Lawrence NorthDarren E Kane        Medical record number: [id number]            Date of birth: October 21, 1966           This patient has been seen and discussed with the Dr. Allena KatzPatel. Please see the resident's note for complete details. 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  With regards to his HIV he is well controlled I think his CD4 dropped in the setting of this acute severe infection and illness  I would continue antiretrovirals and continue PCP prophylaxis with daily Bactrim  Paulette BlanchCornelius Van Dam 12/13/2016, 5:47 PM

## 2016-12-14 NOTE — Progress Notes (Signed)
Pt placed back on vent at this time due to rr 40's. RT will continue to monitor.

## 2016-12-15 ENCOUNTER — Inpatient Hospital Stay (HOSPITAL_COMMUNITY): Payer: Medicaid Other

## 2016-12-15 DIAGNOSIS — R140 Abdominal distension (gaseous): Secondary | ICD-10-CM

## 2016-12-15 LAB — MISC LABCORP TEST (SEND OUT): LABCORP TEST CODE: 914280

## 2016-12-15 LAB — RENAL FUNCTION PANEL
ANION GAP: 11 (ref 5–15)
Albumin: 1.9 g/dL — ABNORMAL LOW (ref 3.5–5.0)
BUN: 28 mg/dL — ABNORMAL HIGH (ref 6–20)
CHLORIDE: 97 mmol/L — AB (ref 101–111)
CO2: 30 mmol/L (ref 22–32)
Calcium: 9.1 mg/dL (ref 8.9–10.3)
Creatinine, Ser: 0.79 mg/dL (ref 0.61–1.24)
GFR calc Af Amer: >60 mL/min (ref >60)
GFR calc non Af Amer: >60 mL/min (ref >60)
GLUCOSE: 219 mg/dL — AB (ref 65–99)
POTASSIUM: 3.5 mmol/L (ref 3.5–5.1)
Phosphorus: 3.8 mg/dL (ref 2.5–4.6)
SODIUM: 138 mmol/L (ref 135–145)

## 2016-12-15 LAB — CBC
HEMATOCRIT: 29.6 % — AB (ref 39.0–52.0)
Hemoglobin: 9.3 g/dL — ABNORMAL LOW (ref 13.0–17.0)
MCH: 30.6 pg (ref 26.0–34.0)
MCHC: 31.4 g/dL (ref 30.0–36.0)
MCV: 97.4 fL (ref 78.0–100.0)
Platelets: 396 10*3/uL (ref 150–400)
RBC: 3.04 MIL/uL — AB (ref 4.22–5.81)
RDW: 17.5 % — ABNORMAL HIGH (ref 11.5–15.5)
WBC: 11.9 10*3/uL — ABNORMAL HIGH (ref 4.0–10.5)

## 2016-12-15 LAB — GLUCOSE, CAPILLARY
GLUCOSE-CAPILLARY: 224 mg/dL — AB (ref 65–99)
GLUCOSE-CAPILLARY: 204 mg/dL — AB (ref 65–99)
GLUCOSE-CAPILLARY: 255 mg/dL — AB (ref 65–99)
Glucose-Capillary: 184 mg/dL — ABNORMAL HIGH (ref 65–99)
Glucose-Capillary: 221 mg/dL — ABNORMAL HIGH (ref 65–99)
Glucose-Capillary: 320 mg/dL — ABNORMAL HIGH (ref 65–99)
Glucose-Capillary: 344 mg/dL — ABNORMAL HIGH (ref 65–99)

## 2016-12-15 MED ORDER — HALOPERIDOL LACTATE 5 MG/ML IJ SOLN
5.0000 mg | INTRAMUSCULAR | Status: AC
  Administered 2016-12-15: 5 mg via INTRAVENOUS

## 2016-12-15 MED ORDER — INSULIN GLARGINE 100 UNIT/ML ~~LOC~~ SOLN
35.0000 [IU] | SUBCUTANEOUS | Status: DC
  Filled 2016-12-15: qty 0.35

## 2016-12-15 MED ORDER — HALOPERIDOL LACTATE 5 MG/ML IJ SOLN
INTRAMUSCULAR | Status: AC
  Filled 2016-12-15: qty 1

## 2016-12-15 MED ORDER — INSULIN GLARGINE 100 UNIT/ML ~~LOC~~ SOLN
15.0000 [IU] | Freq: Every evening | SUBCUTANEOUS | Status: DC
Start: 2016-12-15 — End: 2016-12-16
  Administered 2016-12-15: 15 [IU] via SUBCUTANEOUS
  Filled 2016-12-15: qty 0.15

## 2016-12-15 MED ORDER — SODIUM CHLORIDE 0.9 % IV SOLN
500.0000 mg | Freq: Two times a day (BID) | INTRAVENOUS | Status: AC
  Administered 2016-12-15 – 2016-12-19 (×10): 500 mg via INTRAVENOUS
  Filled 2016-12-15 (×11): qty 4

## 2016-12-15 NOTE — Progress Notes (Signed)
[facility] PROGRESS NOTE  Lawrence Kane OAC:[id number] DOB: Apr 25, 1966 DOA: 12/01/2016  PCP: No primary care provider on file.  Brief History/Interval Summary: 51 year old man with HIV/AIDS (first diagnosed 2003, last CD4 count 245 with viremia of 1,160 in October 2017; on Genovya, Darunavir and Bactrim), Genital Warts, COPD, and cocaine substance abuse who presented to [facility] on 1/24 with complaint of altered mental status. Per notes, patient may have recently had some type of surgery in [place] where his mother resides. Afebrile, tachycardic to 155, RR 18, hypertensive to 240/112. Patient was intubated for airway protection, CVL in IJ was placed, and LP was performed given his altered mental status. ID was consulted. Patient was placed on antibiotics. He subsequently had to undergo tracheostomy.   Reason for Visit: Meningitis. Possible transverse myelitis  Consultants: Infectious disease. Critical care medicine. Neurology.  STUDIES:  1/24 CXR >> ATX vs LLL infiltrate 1/25 CT head >> neg for acute intracranial process 1/25 LP cytology >> no malignant cells 1/25 LP >> yellow, cloudy, glucose 20, total protein >600, RBC 114>49, WBC 970, 36% neutrophil, 20% lymphs, 19% eos 1/28 BAL cell count >> 266 WBC, eos 1 1/28 BAL cytology >> no malignant cells 1/27 BAL for wet mount> neg for parasites 1/27 MRI brain >> small acute infarcts in cerebellum and cerebral cortex, sucal signal 1/29 LP >> Glucose 82, protein >600, WBC 600>885, eosinophils 28% 1/29 Echo >> LV EF 60-65%, indeterminent diastolic function, wall motion normal 1/30 EEG >> diffuse slowing and background suppression, no seizures 2/3 MRI thoracic/lumbar/cervical >> (-) abscess. Meningitis. R/O CMV   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 1/24 Cryptococcal [name]): negative 1/24 CSF [facility]): NGTD 1/22 scrotal wound cx [name]): MRSA Toxoplasma [name]):  Negative 1/25BCx x2 : NGTD 1/25 HCV ab + 1/24 Trach asp 1/24: NGTD 1/24MRSA PCR: MRSA pos 1/25 HIV quant: 80, CD4 10 1/25 CSF AFB: Smear neg, >> 1/25 CSF fungal cx: NGTD 1/25 CSF crypto ag: neg 1/25 CSF gram stain no organisms, cx: NGTD 1/27 strongyloides ab >> negative 1/27 coccidiomycosis ab >> 1/28 BAL AFB smear/cx> 1/28 BAL Fungus cx >> 1/28 BAL gram stain/Cx: 40K ESBL Klebsiella pneumoniae 1/28 BAL pneumocystis smear: Neg 1/27 O&P >> 1/29 CSF Cx >> no organisms on gram stain, >> NGTD 2/04 CSF CMV >> not done?   ANTIBIOTICS: Rocephin 1/24>>1/30 Vancomycin 1/24>>1/30; 2/2 >  Acyclovir 1/24>> 1/26, 1/27>> Ampicillin 1/24>> 1/27 Bactrim M/W/F for PCP prophylaxis Meropenem 1/30>> 2/1; 2/2 >  Pen G 2/1 >> 2/2 Cipro 2/1 >> 2/2  SIGNIFICANT EVENTS: 1/24  Transferred from [facility] 1/25  Underwent LP here 2/05  Trach 2/06  To ATC  LINES/TUBES: L IJ CVL 1/24 >> ETT 1/24 >> 2/5 Foley 1/24 >> OGT 1/24 >> 2/5 Trach (df) 2/5 >>   Subjective/Interval History: Patient with tracheostomy. Unable to communicate.  ROS: Unable to do.  Objective:  Vital Signs  Vitals:   12/15/16 1100 12/15/16 1105 12/15/16 1143 12/15/16 1200  BP: 127/77 127/77  135/82  Pulse: 97 98  96  Resp: (!) 50 (!) 45  (!) 25  Temp:   98.8 F (37.1 C)   TempSrc:   Oral   SpO2: 92% 93%  90%  Weight:      Height:        Intake/Output Summary (Last 24 hours) at 12/15/16 1235 Last data filed at 12/15/16 1205  Gross per 24 hour  Intake  1712.5 ml  Output             1750 ml  Net            -37.5 ml   Filed Weights   12/13/16 0500 12/14/16 0125 12/15/16 0331  Weight: 106.6 kg (235 lb 0.2 oz) 103.5 kg (228 lb 2.8 oz) 92.6 kg (204 lb 2.3 oz)    General appearance: alert, distracted and no distress Head: Normocephalic, without obvious abnormality, atraumatic Neck: Tracheostomy noted Resp: Coarse breath sounds bilaterally. No wheezing, rales or rhonchi. Diminished at the  bases. Cardio: regular rate and rhythm, S1, S2 normal, no murmur, click, rub or gallop GI: soft, non-tender; bowel sounds normal; no masses,  no organomegaly Extremities: Minimal edema noted bilateral lower extremities Pulses: 2+ and symmetric Neurologic: Patient is awake. Unable to test orientation. Seems to be distracted. Does not follow commands yet.  Lab Results:  Data Reviewed: I have personally reviewed following labs and imaging studies  CBC:  Recent Labs Lab 12/10/16 0424 12/11/16 0330 12/12/16 0319 12/13/16 0219 12/15/16 0430  WBC 10.1 9.6 12.3* 10.5 11.9*  HGB 8.3* 8.5* 9.0* 8.4* 9.3*  HCT 26.1* 27.2* 29.0* 26.7* 29.6*  MCV 96.0 97.5 97.0 97.8 97.4  PLT 277 286 356 321 875    Basic Metabolic Panel:  Recent Labs Lab 12/09/16 0306 12/10/16 0424 12/11/16 0330 12/12/16 0319 12/13/16 0219 12/15/16 0439  NA 137 131* 133* 133* 136 138  K 4.4 4.7 4.5 5.2* 4.1 3.5  CL 99* 93* 93* 90* 97* 97*  CO2 33* 31 33* 34* 35* 30  GLUCOSE 126* 180* 211* 245* 223* 219*  BUN 25* 29* 25* 23* 24* 28*  CREATININE 1.11 1.19 0.97 0.83 0.82 0.79  CALCIUM 8.0* 7.8* 8.2* 8.1* 8.4* 9.1  MG 2.1 2.1 1.9 1.7 2.0  --   PHOS 3.6 2.7 3.6 3.2 2.6 3.8    GFR: Estimated Creatinine Clearance: 134.1 mL/min (by C-G formula based on SCr of 0.79 mg/dL).  Liver Function Tests:  Recent Labs Lab 12/09/16 0306 12/10/16 0424 12/11/16 0330 12/12/16 0319 12/13/16 0219 12/15/16 0439  AST 242* 418* 322* 197* 126*  --   ALT 333* 397* 391* 350* 248*  --   ALKPHOS 71 83 94 101 93  --   BILITOT 1.0 1.2 0.9 0.9 0.7  --   PROT 6.6 7.2 7.4 8.4* 7.6  --   ALBUMIN 1.6* 1.7* 1.6* 1.8* 1.7* 1.9*     Coagulation Profile:  Recent Labs Lab 12/13/16 1433  INR 0.99    CBG:  Recent Labs Lab 12/14/16 1933 12/15/16 0033 12/15/16 0347 12/15/16 0733 12/15/16 1142  GLUCAP 176* 221* 224* 204* 255*     Recent Results (from the past 240 hour(s))  CSF culture     Status: None   Collection  Time: 12/06/16  4:54 PM  Result Value Ref Range Status   Specimen Description CSF  Final   Special Requests NONE  Final   Gram Stain   Final    WBC PRESENT,BOTH PMN AND MONONUCLEAR NO ORGANISMS SEEN CYTOSPIN    Culture NO GROWTH 3 DAYS  Final   Report Status 12/09/2016 FINAL  Final  Culture, blood (routine x 2)     Status: None   Collection Time: 12/08/16  9:37 AM  Result Value Ref Range Status   Specimen Description BLOOD RIGHT ANTECUBITAL  Final   Special Requests BOTTLES DRAWN AEROBIC AND ANAEROBIC 5CC  Final   Culture NO GROWTH 5 DAYS  Final  Report Status 12/13/2016 FINAL  Final  Culture, blood (routine x 2)     Status: None   Collection Time: 12/08/16  9:41 AM  Result Value Ref Range Status   Specimen Description BLOOD BLOOD RIGHT ARM  Final   Special Requests BOTTLES DRAWN AEROBIC AND ANAEROBIC 5CC  Final   Culture NO GROWTH 5 DAYS  Final   Report Status 12/13/2016 FINAL  Final      Radiology Studies: Dg Chest Port 1 View  Result Date: 12/15/2016 CLINICAL DATA:  Pulmonary edema. EXAM: PORTABLE CHEST 1 VIEW COMPARISON:  12/13/2016 and 12/12/2016 FINDINGS: Tracheostomy tube is in place. Left jugular vein catheter is unchanged with the tip at the junction of the jugular vein and left innominate vein. Pulmonary vascular congestion has improved. The infiltrate and effusion at the left lung base process. New slight atelectasis at the right lung base. IMPRESSION: 1. A persistent infiltrate and effusion at the left lung base, stable. 2. New slight atelectasis at the right lung base. 3. Improved pulmonary vascular prominence. Electronically Signed   By: Lorriane Shire M.D.   On: 12/15/2016 07:34   Dg Chest Port 1 View  Result Date: 12/13/2016 CLINICAL DATA:  Respiratory failure EXAM: PORTABLE CHEST 1 VIEW COMPARISON:  12/12/2016 FINDINGS: Tracheostomy tube is noted. Left jugular central line is again seen and stable. The nasogastric catheter has been removed. Left retrocardiac density  is noted consistent with atelectasis/infiltrate. Small left pleural effusion is noted. Mild vascular congestion is noted. IMPRESSION: Stable left retrocardiac infiltrate and small effusion. Mild vascular congestion is seen. Electronically Signed   By: Inez Catalina M.D.   On: 12/13/2016 17:10   Dg Abd Portable 1v  Result Date: 12/14/2016 CLINICAL DATA:  Nasogastric tube placement. EXAM: PORTABLE ABDOMEN - 1 VIEW COMPARISON:  12/07/2016 FINDINGS: Nasogastric tube enters the stomach in has its tip at the proximal fourth portion of the duodenum. Upper abdominal bowel gas pattern is unremarkable. Atelectasis persists at the left base. IMPRESSION: Nasogastric tube tip at the proximal fourth portion of the duodenum. Electronically Signed   By: Nelson Chimes M.D.   On: 12/14/2016 16:26     Medications:  Scheduled: . small volume/piggyback builder   Intravenous Q8H  . aspirin  325 mg Per Tube Daily  . chlorhexidine gluconate (MEDLINE KIT)  15 mL Mouth Rinse BID  . clonazePAM  2 mg Per Tube TID  . darunavir-cobicistat  1 tablet Oral Q breakfast  . dolutegravir  50 mg Oral Daily  . emtricitabine-tenofovir AF  1 tablet Per Tube Daily  . famotidine  20 mg Per Tube BID  . fentaNYL  50 mcg Transdermal Q72H  . furosemide  20 mg Intravenous Q12H  . heparin  5,000 Units Subcutaneous Q8H  . insulin aspart  0-20 Units Subcutaneous Q4H  . insulin aspart  4 Units Subcutaneous Q4H  . insulin glargine  30 Units Subcutaneous Q24H  . lactulose  45 g Per Tube TID  . mouth rinse  15 mL Mouth Rinse QID  . methylPREDNISolone (SOLU-MEDROL) injection  500 mg Intravenous Q12H  . polyethylene glycol  17 g Per Tube Daily  . sulfamethoxazole-trimethoprim  20 mL Per Tube Daily   Continuous: . dextrose    . feeding supplement (VITAL AF 1.2 CAL) Stopped (12/15/16 1259)   WCB:JSEGBT chloride, acetaminophen, chlorproMAZINE (THORAZINE) IV, dextrose, docusate, fentaNYL (SUBLIMAZE) injection, hydrALAZINE, ibuprofen, midazolam,  midazolam, sennosides, sodium chloride flush  Assessment/Plan:  Active Problems:   COPD (chronic obstructive pulmonary disease) (HCC)  Substance abuse   Genital warts   HIV (human immunodeficiency virus infection) ([place])   AIDS (acquired immune deficiency syndrome) ([place])   Noncompliance   Bacterial meningitis   Hypertensive urgency   Septic shock (HCC)   Transaminitis   Cocaine abuse   Open wound   Acute respiratory failure with hypoxia (HCC)   Altered mental status   Acute encephalopathy   Cerebral embolism with cerebral infarction   Endotracheally intubated   Meningoencephalitis   Neurosyphilis   Herpesviral meningitis   Paraplegia (HCC)   Acute pulmonary edema (HCC)   Transverse myelitis (HCC)   Encounter for orogastric (OG) tube placement   Somnolence   Abdominal distention    Acute Hypoxemic Respiratory Failure  This was secondary to HCAP ESBL Klebsiella LLL+ sepsis + Unable to protect airway + pulm edema. Patient was on the ventilator for a prolonged duration. Subsequently underwent tracheostomy placement on 2/5. Pulmonology is following. Being weaned.  Meningitis bacterial + viral. No organisms isolated except for HSV. Differentials : fungal infxn, septic emboli, neurosyphillis, CMV based on MRI. HSV positive on CSF from [facility]. . Infectious disease is following. Patient is on acyclovir. Off of meropenem. Also on Bactrim for PCP prophylaxis.  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 Patient is on acyclovir. High-dose steroids, started today. Neurology is following. On fentanyl patch for pain issues. Started during this hospitalization.  Pulm edema Seems to have improved. Continues to be on Lasix, which will be continued for now.   Moderate to Severe Protein Calorie Malnutrition  On tube feedings  Transaminitis Worsened by seroquel likely which was discontinued on 1/31).  Improved.   HCV ab+, Chronic hep b Management will be  pursued as outpatient.  Abdominal Distention Abdominal x-ray did not show any concerning findings. Continue to monitor.  Normocytic anemia Hemoglobin stable. No evidence for overt bleeding. Continue to monitor.  LLL PNA with ESBL Klebsiella  Completed treatment. Monitor WBC.  HIV On antiretroviral treatment. Infectious disease is following.  Hyperglycemia Unknown if patient has a history of diabetes. Currently on Lantus insulin and sliding scale insulin coverage. HbA1c 6.6 on 1/26. ABGs will likely get worse due to steroids. May have to go up on the dose of Lantus.  Metabolic Encephalopathy Continue to monitor. Seems to be stable. On clonazepam.  History of Cocaine Abuse Will need counseling  Small acute CVA in cerebellum and cerebral cortex On aspirin. Could be related to his acute illness. PT and OT evaluation when more improved.   DVT Prophylaxis: Subcutaneous heparin    Code Status: Full code  Family Communication: No family at bedside  Disposition Plan: Will remain in step down unit.    LOS: 14 days   [facility] Pager [phone] 12/15/2016, 12:35 PM  If 7PM-7AM, please contact night-coverage at [email or web address], password [facility]

## 2016-12-15 NOTE — Progress Notes (Signed)
Patient's family [facility]. [place]. Family understands that we will work to get Patient close to family, however, if no bed is available at preferred facility, we will have to go with the first available bed. Family reports understanding.   CSW has made the following [facility] referrals:  [facility] [place] [facility]: [facility] [place] [place] [facility] [place], [place] [facility] [place], [place] [facility] [place]  CSW continues to follow for disposition.    Enos FlingAshley Skylan Lara, MSW, LCSW [facility] Clinical Social Worker [phone]

## 2016-12-15 NOTE — Progress Notes (Signed)
Inpatient Diabetes Program Recommendations  AACE/ADA: New Consensus Statement on Inpatient Glycemic Control (2015)  Target Ranges:  Prepandial:   less than 140 mg/dL      Peak postprandial:   less than 180 mg/dL (1-2 hours)      Critically ill patients:  140 - 180 mg/dL   Lab Results  Component Value Date   GLUCAP 204 (H) 12/15/2016   HGBA1C 6.6 (H) 12/03/2016    Review of Glycemic Control:  Results for [name], Lawrence E (MRN [id number]) as of 12/15/2016 11:47  Ref. Range 12/14/2016 15:52 12/14/2016 19:33 12/15/2016 00:33 12/15/2016 03:47 12/15/2016 07:33  Glucose-Capillary Latest Ref Range: 65 - 99 mg/dL 409153 (H) 811176 (H) 914221 (H) 224 (H) 204 (H)   Diabetes history: None noted Current orders for Inpatient glycemic control:  Novolog resistant q 4 hours, Novolog 4 units q 4 hours, Lantus 30 units q 24 hours Solumedrol 500 mg IV bid  Inpatient Diabetes Program Recommendations:   CBG's may rise with IV steroids.  If blood sugars continue>200 mg/dL, may need IV insulin to keep blood sugars within goal.  Will follow.  Thanks, Beryl MeagerJenny Gaudencio Chesnut, RN, BC-ADM Inpatient Diabetes Coordinator Pager [phone] (8a-5p)

## 2016-12-15 NOTE — Progress Notes (Addendum)
PULMONARY / CRITICAL CARE MEDICINE   Name: Lawrence Kane MRN: [id number] DOB: [birth year]    ADMISSION DATE:  12/01/2016 CONSULTATION DATE:  12/01/2016  REFERRING MD:  [name] transfer  CHIEF COMPLAINT:  AMS  BRIEF SUMMARY:   51 year old man with HIV/AIDS (first diagnosed 2003, last CD4 count 245 with viremia of 1,160 in October 2017; on Genovya, Darunavir and Bactrim), Genital Warts, COPD, and cocaine substance abuse who presented to [facility] on 1/24 with complaint of altered mental status. Per notes, patient may have recently had some type of surgery in [place] where his mother resides. Afebrile, tachycardic to 155, RR 18, hypertensive to 240/112. Patient was intubated for airway protection, CVL in IJ was placed, and LP was performed given his altered mental status. He was started on Versed and propofol for sedation. Initial labs showed leukocytosis of 13.9, hemoglobin 10.9, hct 33.6 and plts 269. BMET showed Sodium 139, K 3.9, Cl 101, Bicarb 21, BUN 23, Creatine 1.10, glucose 163. ABG showed pH of 7.28, pCO2 48, pO2 158, on FiO2 of 40%, PEEP 5, TV 450 and RR 18. Lactic Acid 3.3, 6.9. AST 125, ALT 224, and Alk Phos 88. UA negative for infection. UDS positive for cocaine. CSF appeared yellow, cloudy, xanthochromic, WBC 4845, RBC 625, glucose <20, total protein >300, Neutrophils 16%, Eos 64%, monocytes 17%, lymphocytes 3%. Gram stain performed, but unable to view results. ID was consulted. Dr. Baxter Flattery recommended continuing Truvada and Tivicay through NGT.  Prolonged respiratory failure s/p trach (2/5).  Weaned to ATC 2/6.    SUBJECTIVE:  RN reports no acute events.  Afebrile / VSS. Liquid stool overnight (on lactulose + TF)  VITAL SIGNS: BP (!) 140/91   Pulse 86   Temp 98.7 F (37.1 C) (Oral)   Resp (!) 45   Ht _0  (1.93 m)   Wt 204 lb 2.3 oz (92.6 kg)   SpO2 90%   BMI 24.85 kg/m   VENTILATOR SETTINGS: Vent Mode: PRVC FiO2 (%):  [30 %-40 %] 30 % Set Rate:  [16 bmp] 16  bmp Vt Set:  [620 mL] 620 mL PEEP:  [5 cmH20] 5 cmH20 Plateau Pressure:  [16 cmH20-23 cmH20] 23 cmH20  INTAKE / OUTPUT: I/O last 3 completed shifts: In: 1842.9 [I.V.:460.4; NG/GT:1182.5; IV Piggyback:200] Out: 8182 [Urine:3790]  PHYSICAL EXAMINATION: General:  Chronically ill appearing male in NAD HEENT: MM pink/moist, #8 ATC midline c/d/i Neuro: generalized weakness, nods yes/no occasionally CV: s1s2 rrr, no m/r/g PULM: even/non-labored on ATC, lungs bilaterally diminished XH:BZJI, non-tender, bsx4 active  Extremities: warm/dry, trace generalized edema  Skin: no rashes or lesions   LABS:  BMET  Recent Labs Lab 12/12/16 0319 12/13/16 0219 12/15/16 0439  NA 133* 136 138  K 5.2* 4.1 3.5  CL 90* 97* 97*  CO2 34* 35* 30  BUN 23* 24* 28*  CREATININE 0.83 0.82 0.79  GLUCOSE 245* 223* 219*    Electrolytes  Recent Labs Lab 12/11/16 0330 12/12/16 0319 12/13/16 0219 12/15/16 0439  CALCIUM 8.2* 8.1* 8.4* 9.1  MG 1.9 1.7 2.0  --   PHOS 3.6 3.2 2.6 3.8    CBC  Recent Labs Lab 12/12/16 0319 12/13/16 0219 12/15/16 0430  WBC 12.3* 10.5 11.9*  HGB 9.0* 8.4* 9.3*  HCT 29.0* 26.7* 29.6*  PLT 356 321 396    Coag's  Recent Labs Lab 12/13/16 1433  APTT 35  INR 0.99    Sepsis Markers No results for input(s): LATICACIDVEN, PROCALCITON, O2SATVEN in the last 168 hours.  ABG  Recent Labs Lab 12/08/16 2325  PHART 7.424  PCO2ART 52.5*  PO2ART 78.3*    Liver Enzymes  Recent Labs Lab 12/11/16 0330 12/12/16 0319 12/13/16 0219 12/15/16 0439  AST 322* 197* 126*  --   ALT 391* 350* 248*  --   ALKPHOS 94 101 93  --   BILITOT 0.9 0.9 0.7  --   ALBUMIN 1.6* 1.8* 1.7* 1.9*    Cardiac Enzymes No results for input(s): TROPONINI, PROBNP in the last 168 hours.  Glucose  Recent Labs Lab 12/14/16 1203 12/14/16 1552 12/14/16 1933 12/15/16 0033 12/15/16 0347 12/15/16 0733  GLUCAP 147* 153* 176* 221* 224* 204*    Imaging Dg Chest Port 1  View  Result Date: 12/15/2016 CLINICAL DATA:  Pulmonary edema. EXAM: PORTABLE CHEST 1 VIEW COMPARISON:  12/13/2016 and 12/12/2016 FINDINGS: Tracheostomy tube is in place. Left jugular vein catheter is unchanged with the tip at the junction of the jugular vein and left innominate vein. Pulmonary vascular congestion has improved. The infiltrate and effusion at the left lung base process. New slight atelectasis at the right lung base. IMPRESSION: 1. A persistent infiltrate and effusion at the left lung base, stable. 2. New slight atelectasis at the right lung base. 3. Improved pulmonary vascular prominence. Electronically Signed   By: Lorriane Shire M.D.   On: 12/15/2016 07:34   Dg Abd Portable 1v  Result Date: 12/14/2016 CLINICAL DATA:  Nasogastric tube placement. EXAM: PORTABLE ABDOMEN - 1 VIEW COMPARISON:  12/07/2016 FINDINGS: Nasogastric tube enters the stomach in has its tip at the proximal fourth portion of the duodenum. Upper abdominal bowel gas pattern is unremarkable. Atelectasis persists at the left base. IMPRESSION: Nasogastric tube tip at the proximal fourth portion of the duodenum. Electronically Signed   By: Nelson Chimes M.D.   On: 12/14/2016 16:26     STUDIES:  1/24 CXR >> ATX vs LLL infiltrate 1/25 CT head >> neg for acute intracranial process 1/25 LP cytology >> no malignant cells 1/25 LP >> yellow, cloudy, glucose 20, total protein >600, RBC 114>49, WBC 970, 36% neutrophil, 20% lymphs, 19% eos 1/28 BAL cell count >> 266 WBC, eos 1 1/28 BAL cytology >> no malignant cells 1/27 BAL for wet mount> neg for parasites 1/27 MRI brain >> small acute infarcts in cerebellum and cerebral cortex, sucal signal 1/29 LP >> Glucose 82, protein >600, WBC 600>885, eosinophils 28% 1/29 Echo >> LV EF 60-65%, indeterminent diastolic function, wall motion normal 1/30 EEG >> diffuse slowing and background suppression, no seizures 2/3 MRI thoracic/lumbar/cervical >> (-) abscess. Meningitis. R/O  CMV   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 1/24 Cryptococcal [name]): negative 1/24 CSF [facility]): NGTD 1/22 scrotal wound cx [name]): MRSA Toxoplasma [name]): Negative 1/25 BCx x2 : NGTD 1/25 HCV ab + 1/24 Trach asp 1/24: NGTD 1/24 MRSA PCR: MRSA pos 1/25 HIV quant: 80, CD4 10 1/25 CSF AFB: Smear neg, >> 1/25 CSF fungal cx: NGTD 1/25 CSF crypto ag: neg 1/25 CSF gram stain no organisms, cx: NGTD 1/27 strongyloides ab >> negative 1/27 coccidiomycosis ab >> 1/28 BAL AFB smear/cx> 1/28 BAL Fungus cx >> 1/28 BAL gram stain/Cx: 40K ESBL Klebsiella pneumoniae 1/28 BAL pneumocystis smear: Neg 1/27 O&P >> 1/29 CSF Cx >> no organisms on gram stain, >> NGTD 2/04 CSF CMV >> not done?   ANTIBIOTICS: Rocephin 1/24>>1/30 Vancomycin 1/24>>1/30; 2/2 >  Acyclovir 1/24>> 1/26, 1/27>> Ampicillin 1/24>> 1/27 Bactrim M/W/F for PCP prophylaxis Meropenem 1/30>>  2/1; 2/2 >  Pen G 2/1 >> 2/2 Cipro 2/1 >> 2/2  SIGNIFICANT EVENTS: 1/24  Transferred from [facility] 1/25  Underwent LP here 2/05  Trach 2/06  To ATC  LINES/TUBES: L IJ CVL 1/24 >> ETT 1/24 >> 2/5 Foley 1/24 >> OGT 1/24 >> 2/5 Trach (df) 2/5 >>  DISCUSSION: 52 year old man with HIV/AIDS (first diagnosed 2003, last CD4 count 245 with viremia of 1,160 in October 2017; on Genovya, Darunavir and Bactrim), Genital Warts, COPD, and cocaine substance abuse who presented to [facility] on 1/24 with altered mental status. Intubated for airway protection. Prolonged vent wean due to ESBL klebsiella HCAP with sepsis > trach 2/5.    ASSESSMENT / PLAN:  PULMONARY A: Acute Hypoxemic Respiratory Failure 2/2 HCAP ESBL Klebsiella LLL+ sepsis + Unable to protect airway + pulm edema Tracheostomy Status - 2/5 P:   ATC as tolerated  PRN vent support, PRVC 8cc, R 16, PEEP 5 Wean O2 for sats > 90% Pulmonary hygiene  Intermittent CXR  Mobilize  CARDIOVASCULAR A:  Sinus Tachycardia  - resolved Incomplete RBBB HTN Pulm edema P:  Tx to SDU  PRN Hydralazine  Lasix 20 mg Q12   RENAL A:   At Risk AKI - in setting of critical illness, diuresis P:   Trend BMP / UOP  Replace electrolytes as indicted   GASTROINTESTINAL A:   Moderate to Severe Protein Calorie Malnutrition  Transaminitis, (worsened by seroquel likely which was discontinued on 1/31).  Improved.  HCV ab+, Chronic hep b Abdominal Distention - noted 2/6, port abd 2/6 with unremarkable gas pattern P:   Continue TF per nutrition  Trend LFT's  Pepcid BID  Lactulose, colace  HEMATOLOGIC A:   Mild leukocytosis > Resolved Mild anemia P:  SQ Heparin for DVT prophylaxis   INFECTIOUS A:   Meningitis, bacterial + viral. 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 P:   Minimize sedation as able  Continue clonazepam 57m TID Continue acyclovir  Fentanyl patch 50 mcg Q 72 PRN versed for agitation  ASA daily Will discuss role steroids for spinal cord  FAMILY  - Updates: No family available am 2/7.    - Inter-disciplinary family meet or Palliative Care meeting due by:  2/7  BNoe Gens NP-C [facility] Pgr: [phone] or if no answer [phone]/05/2017, 10:33 AM   STAFF NOTE: I, DMerrie Roof MD FACP have personally reviewed patient's available data, including medical  history, events of note, physical examination and test results as part of my evaluation. I have discussed with resident/NP and other care providers such as pharmacist, RN and RRT. In addition, I personally evaluated patient and elicited key findings of: awake, fc well, ronchi worse left base, did TC 8 hours then this am failed after a few hours, concern remains on LLL  infiltrate/atx, monitor for fevers, back to vent rest now, lasix to maintain , was neg 771 cc, await sdu bed still, antivirals per ID, I did d/w neuro yesterday to assess need for steroids for myeltitis, to start today, needs PCP prevention (on bactrim)  with high dose steroids and cd4, dc line neck if able  Lavon Paganini. Titus Mould, MD, [place] Pgr: [facility] 12/15/2016 11:12 AM

## 2016-12-15 NOTE — Progress Notes (Signed)
  Agitated per RN and RASS +2 pn camera exam  Plan Haldol 5mg  IV x 1  Dr. Kalman ShanMurali Annette Liotta, M.D., [facility] LLCF.C.C.P Pulmonary and Critical Care Medicine Staff Physician [facility] Pager: [phone], If no answer or between  15:00h - 7:00h: call [phone]  12/15/2016 10:22 PM

## 2016-12-15 NOTE — Progress Notes (Signed)
Nutrition Follow-up   DOCUMENTATION CODES:   Not applicable  INTERVENTION:    Continue Vital AF 1.2 at 75 ml/h (1800 ml per day) to provide 2160 kcals, 135 gm protein, 1460 ml free water daily.  Provides 2355 kcal total with TF + Propofol.  NUTRITION DIAGNOSIS:   Inadequate oral intake related to inability to eat as evidenced by NPO status.  Ongoing  GOAL:   Patient will meet greater than or equal to 90% of their needs   Met with TF  MONITOR:   Vent status, Labs, I & O's  ASSESSMENT:   50-year-old man with HIV/AIDS (first diagnosed 2003, last CD4 count 245 with viremia of 1,160 in October 2017; on Genovya, Darunavir and Bactrim), Genital Warts, COPD, and cocaine substance abuse who presented to [facility] on 1/24 with altered mental status. Intubated for airway protection and transferred to [facility].  Discussed patient in ICU rounds and with RN today. Being treated for bacterial meningitis, HSV meningitis, and Klebsiella PNA. Patient remains intubated on ventilator support MV: 12.5 L/min Temp (24hrs), Avg:98.9 F (37.2 C), Min:98.6 F (37 C), Max:99.2 F (37.3 C)  Propofol: off Currently receiving Vital AF 1.2 via OGT at 75 ml/h (1800 ml/day) to provide 2160 kcals, 135 gm protein, 1460 ml free water daily.  Labs reviewed. Medications reviewed and include Lactulose and Miralax.  Diet Order:   NPO  Skin:  Wound (see comment) (non pressure wound to scrotum)  Last BM:  2/7  Height:   Ht Readings from Last 1 Encounters:  12/01/16 6' 4" (1.93 m)    Weight:   Wt Readings from Last 1 Encounters:  12/15/16 204 lb 2.3 oz (92.6 kg)   12/09/16 233 lb 4 oz (105.8 kg)   12/01/16 205 lb (93 kg)     BMI=25    Ideal Body Weight:  91.8 kg  BMI:  Body mass index is 24.85 kg/m.  Estimated Nutritional Needs:   Kcal:  2215  Protein:  120-140 gm  Fluid:  2.3 L  EDUCATION NEEDS:   No education needs identified at this time  Kimberly Harris, RD, LDN,  CNSC Pager [phone] After Hours Pager [phone]  

## 2016-12-15 NOTE — Progress Notes (Signed)
Subjective: This morning, he was not as arousable as yesterday. He is no longer on any sedation. Steroid therapy was initiated today.    Antibiotics:  Anti-infectives    Start     Dose/Rate Route Frequency Ordered Stop   12/14/16 1515  sulfamethoxazole-trimethoprim (BACTRIM,SEPTRA) 200-40 MG/5ML suspension 20 mL     20 mL Per Tube Daily 12/14/16 1508     12/13/16 1800  sulfamethoxazole-trimethoprim (BACTRIM DS,SEPTRA DS) 800-160 MG per tablet 1 tablet  Status:  Discontinued     1 tablet Oral Daily 12/13/16 1753 12/14/16 1508   12/12/16 1800  acyclovir (ZOVIRAX) 1,000 mg in sodium chloride 0.9 % 150 mL injection     150 mL/hr over 60 Minutes Intravenous Every 8 hours 12/12/16 1410     12/10/16 1400  meropenem (MERREM) 2 g in sodium chloride 0.9 % 100 mL IVPB  Status:  Discontinued     2 g 200 mL/hr over 30 Minutes Intravenous Every 8 hours 12/10/16 1140 12/14/16 1506   12/10/16 1300  vancomycin (VANCOCIN) 1,250 mg in sodium chloride 0.9 % 250 mL IVPB  Status:  Discontinued     1,250 mg 166.7 mL/hr over 90 Minutes Intravenous Every 8 hours 12/10/16 1140 12/13/16 0824   12/09/16 1000  penicillin G potassium 4 Million Units in dextrose 5 % 250 mL IVPB  Status:  Discontinued     4 Million Units 250 mL/hr over 60 Minutes Intravenous Every 4 hours 12/09/16 0851 12/10/16 1109   12/09/16 1000  ciprofloxacin (CIPRO) IVPB 400 mg  Status:  Discontinued     400 mg 200 mL/hr over 60 Minutes Intravenous Every 12 hours 12/09/16 0929 12/10/16 1109   12/07/16 2245  meropenem (MERREM) 2 g in sodium chloride 0.9 % 100 mL IVPB  Status:  Discontinued     2 g 200 mL/hr over 30 Minutes Intravenous Every 8 hours 12/07/16 2236 12/09/16 0851   12/04/16 2200  vancomycin (VANCOCIN) 1,250 mg in sodium chloride 0.9 % 250 mL IVPB  Status:  Discontinued     1,250 mg 166.7 mL/hr over 90 Minutes Intravenous Every 8 hours 12/04/16 1421 12/07/16 1057   12/04/16 1000  acyclovir (ZOVIRAX) 1,000 mg in dextrose  5 % 150 mL IVPB  Status:  Discontinued     1,000 mg 170 mL/hr over 60 Minutes Intravenous Every 8 hours 12/04/16 0943 12/12/16 1409   12/03/16 0900  acyclovir (ZOVIRAX) 1,080 mg in dextrose 5 % 250 mL IVPB  Status:  Discontinued     1,080 mg 271.6 mL/hr over 60 Minutes Intravenous Every 8 hours 12/03/16 0651 12/03/16 0843   12/03/16 0900  acyclovir (ZOVIRAX) 1,000 mg in dextrose 5 % 150 mL IVPB  Status:  Discontinued     1,000 mg 170 mL/hr over 60 Minutes Intravenous Every 8 hours 12/03/16 0843 12/03/16 1002   12/02/16 1015  darunavir-cobicistat (PREZCOBIX) 800-150 MG per tablet 1 tablet     1 tablet Oral Daily with breakfast 12/02/16 1003     12/02/16 1000  emtricitabine-tenofovir AF (DESCOVY) 200-25 MG per tablet 1 tablet     1 tablet Per Tube Daily 12/01/16 2320     12/02/16 1000  dolutegravir (TIVICAY) tablet 50 mg     50 mg Oral Daily 12/01/16 2320     12/02/16 1000  vancomycin (VANCOCIN) 1,250 mg in sodium chloride 0.9 % 250 mL IVPB  Status:  Discontinued     1,250 mg 166.7 mL/hr over 90 Minutes Intravenous Every  24 hours 12/02/16 0010 12/02/16 0634   12/02/16 0900  acyclovir (ZOVIRAX) 930 mg in dextrose 5 % 150 mL IVPB  Status:  Discontinued     10 mg/kg  93 kg 168.6 mL/hr over 60 Minutes Intravenous Every 8 hours 12/02/16 0632 12/03/16 0651   12/02/16 0700  vancomycin (VANCOCIN) IVPB 1000 mg/200 mL premix  Status:  Discontinued     1,000 mg 200 mL/hr over 60 Minutes Intravenous Every 8 hours 12/02/16 0634 12/04/16 1421   12/02/16 0400  cefTRIAXone (ROCEPHIN) 2 g in dextrose 5 % 50 mL IVPB  Status:  Discontinued     2 g 100 mL/hr over 30 Minutes Intravenous Every 12 hours 12/02/16 0010 12/07/16 2222   12/02/16 0100  acyclovir (ZOVIRAX) 1,110 mg in dextrose 5 % 250 mL IVPB  Status:  Discontinued     10 mg/kg  111.1 kg 272.2 mL/hr over 60 Minutes Intravenous Every 8 hours 12/02/16 0010 12/02/16 0632   12/02/16 0100  sulfamethoxazole-trimethoprim (BACTRIM,SEPTRA) 200-40 MG/5ML  suspension 20 mL  Status:  Discontinued     20 mL Per Tube Once per day on Mon Wed Fri 12/02/16 0010 12/06/16 1416   12/02/16 0000  ampicillin (OMNIPEN) 2 g in sodium chloride 0.9 % 50 mL IVPB  Status:  Discontinued     2 g 150 mL/hr over 20 Minutes Intravenous Every 4 hours 12/01/16 2322 12/04/16 0941      Medications: Scheduled Meds: . small volume/piggyback builder   Intravenous Q8H  . aspirin  325 mg Per Tube Daily  . chlorhexidine gluconate (MEDLINE KIT)  15 mL Mouth Rinse BID  . clonazePAM  2 mg Per Tube TID  . darunavir-cobicistat  1 tablet Oral Q breakfast  . dolutegravir  50 mg Oral Daily  . emtricitabine-tenofovir AF  1 tablet Per Tube Daily  . famotidine  20 mg Per Tube BID  . fentaNYL  50 mcg Transdermal Q72H  . furosemide  20 mg Intravenous Q12H  . heparin  5,000 Units Subcutaneous Q8H  . insulin aspart  0-20 Units Subcutaneous Q4H  . insulin aspart  4 Units Subcutaneous Q4H  . insulin glargine  15 Units Subcutaneous QPM  . [START ON 12/16/2016] insulin glargine  35 Units Subcutaneous Q24H  . lactulose  45 g Per Tube TID  . mouth rinse  15 mL Mouth Rinse QID  . methylPREDNISolone (SOLU-MEDROL) injection  500 mg Intravenous Q12H  . polyethylene glycol  17 g Per Tube Daily  . sulfamethoxazole-trimethoprim  20 mL Per Tube Daily   Continuous Infusions: . dextrose    . feeding supplement (VITAL AF 1.2 CAL) 1,000 mL (12/15/16 1600)   PRN Meds:.sodium chloride, acetaminophen, chlorproMAZINE (THORAZINE) IV, dextrose, docusate, fentaNYL (SUBLIMAZE) injection, hydrALAZINE, ibuprofen, midazolam, midazolam, sennosides, sodium chloride flush    Objective: Weight change: -24 lb 0.5 oz (-10.9 kg)  Intake/Output Summary (Last 24 hours) at 12/15/16 1710 Last data filed at 12/15/16 1700  Gross per 24 hour  Intake          1946.25 ml  Output             1605 ml  Net           341.25 ml   Blood pressure (!) 151/87, pulse 75, temperature 98.6 F (37 C), temperature source  Oral, resp. rate (!) 35, height _0  (1.93 m), weight 204 lb 2.3 oz (92.6 kg), SpO2 94 %. Temp:  [98.6 F (37 C)-99.2 F (37.3 C)] 98.6 F (37 C) (02/07  1602) Pulse Rate:  [74-98] 75 (02/07 1555) Resp:  [17-50] 35 (02/07 1600) BP: (87-161)/(76-135) 151/87 (02/07 1600) SpO2:  [85 %-99 %] 94 % (02/07 1555) FiO2 (%):  [30 %-40 %] 30 % (02/07 1555) Weight:  [204 lb 2.3 oz (92.6 kg)] 204 lb 2.3 oz (92.6 kg) (02/07 0331)  Physical Exam: General: young male, resting in bed, drowsy-appearing HEENT: PERRL, no scleral icterus, oropharynx clear Cardiac: RRR, no rubs, murmurs or gallops Pulm: clear to auscultation bilaterally, no wheezes, rales, or rhonchi Abd: BS present, no tenderness on palpation Ext: BLE wrapped in boots Neuro: does not follow commands, opens eyes intermittently   BMET  Recent Labs  12/13/16 0219 12/15/16 0439  NA 136 138  K 4.1 3.5  CL 97* 97*  CO2 35* 30  GLUCOSE 223* 219*  BUN 24* 28*  CREATININE 0.82 0.79  CALCIUM 8.4* 9.1     Liver Panel   Recent Labs  12/13/16 0219 12/15/16 0439  PROT 7.6  --   ALBUMIN 1.7* 1.9*  AST 126*  --   ALT 248*  --   ALKPHOS 93  --   BILITOT 0.7  --        Sedimentation Rate No results for input(s): ESRSEDRATE in the last 72 hours. C-Reactive Protein No results for input(s): CRP in the last 72 hours.  Micro Results: Recent Results (from the past 720 hour(s))  MRSA PCR Screening     Status: Abnormal   Collection Time: 12/01/16 10:26 PM  Result Value Ref Range Status   MRSA by PCR POSITIVE (A) NEGATIVE Final    Comment:        The GeneXpert MRSA Assay (FDA approved for NASAL specimens only), is one component of a comprehensive MRSA colonization surveillance program. It is not intended to diagnose MRSA infection nor to guide or monitor treatment for MRSA infections. RESULT CALLED TO, READ BACK BY AND VERIFIED WITH: R DONAHUE,RN _0  12/02/16 MKELLY,MLT   Culture, respiratory (tracheal aspirate)      Status: None   Collection Time: 12/01/16 11:05 PM  Result Value Ref Range Status   Specimen Description TRACHEAL ASPIRATE  Final   Special Requests NONE  Final   Gram Stain   Final    FEW WBC PRESENT,BOTH PMN AND MONONUCLEAR NO ORGANISMS SEEN    Culture Consistent with normal respiratory flora.  Final   Report Status 12/04/2016 FINAL  Final  Culture, blood (routine x 2)     Status: None   Collection Time: 12/02/16 12:00 AM  Result Value Ref Range Status   Specimen Description BLOOD LEFT HAND  Final   Special Requests AEROBIC BOTTLE ONLY 10ML  Final   Culture NO GROWTH 5 DAYS  Final   Report Status 12/07/2016 FINAL  Final  Culture, blood (routine x 2)     Status: None   Collection Time: 12/02/16 12:04 AM  Result Value Ref Range Status   Specimen Description BLOOD RIGHT HAND  Final   Special Requests BOTTLES DRAWN AEROBIC AND ANAEROBIC 5ML  Final   Culture NO GROWTH 5 DAYS  Final   Report Status 12/07/2016 FINAL  Final  Acid Fast Smear (AFB)     Status: None   Collection Time: 12/02/16  9:00 AM  Result Value Ref Range Status   AFB Specimen Processing Concentration  Final   Acid Fast Smear Negative  Final    Comment: (NOTE) Performed At: [facility] [place], [place] [id number] Lindon Romp MD [id number]  Source (AFB) ACID FAST BACILLI  Corrected  CSF culture     Status: None   Collection Time: 12/02/16  2:24 PM  Result Value Ref Range Status   Specimen Description CSF  Final   Special Requests NONE  Final   Gram Stain   Final    CYTOSPIN SMEAR WBC PRESENT,BOTH PMN AND MONONUCLEAR NO ORGANISMS SEEN    Culture NO GROWTH 3 DAYS  Final   Report Status 12/06/2016 FINAL  Final  Culture, fungus without smear     Status: None (Preliminary result)   Collection Time: 12/02/16  2:25 PM  Result Value Ref Range Status   Specimen Description CSF  Final   Special Requests NONE  Final   Culture   Final    NO FUNGUS ISOLATED AFTER 7 DAYS CONTINUING  TO HOLD FOR 6 WEEKS PER PHYSICIAN REQUEST   Report Status PENDING  Incomplete  Culture, bal-quantitative     Status: Abnormal   Collection Time: 12/05/16  9:49 AM  Result Value Ref Range Status   Specimen Description BRONCHIAL ALVEOLAR LAVAGE  Final   Special Requests NONE  Final   Gram Stain NO WBC SEEN NO ORGANISMS SEEN  Final   Culture (A)  Final    40,000 COLONIES/mL KLEBSIELLA PNEUMONIAE Confirmed Extended Spectrum Beta-Lactamase Producer (ESBL)    Report Status 12/07/2016 FINAL  Final   Organism ID, Bacteria KLEBSIELLA PNEUMONIAE (A)  Final      Susceptibility   Klebsiella pneumoniae - MIC*    AMPICILLIN >=32 RESISTANT Resistant     CEFAZOLIN >=64 RESISTANT Resistant     CEFEPIME 2 RESISTANT Resistant     CEFTAZIDIME 4 RESISTANT Resistant     CEFTRIAXONE >=64 RESISTANT Resistant     CIPROFLOXACIN 1 SENSITIVE Sensitive     GENTAMICIN >=16 RESISTANT Resistant     IMIPENEM <=0.25 SENSITIVE Sensitive     TRIMETH/SULFA >=320 RESISTANT Resistant     AMPICILLIN/SULBACTAM >=32 RESISTANT Resistant     PIP/TAZO 16 SENSITIVE Sensitive     Extended ESBL POSITIVE Resistant     * 40,000 COLONIES/mL KLEBSIELLA PNEUMONIAE  Pneumocystis smear by DFA     Status: None   Collection Time: 12/05/16  9:49 AM  Result Value Ref Range Status   Specimen Source-PJSRC BRONCHIAL ALVEOLAR LAVAGE  Final   Pneumocystis jiroveci Ag NEGATIVE  Final    Comment: Performed at [facility]  Acid Fast Smear (AFB)     Status: None   Collection Time: 12/05/16  9:49 AM  Result Value Ref Range Status   AFB Specimen Processing Concentration  Final   Acid Fast Smear Negative  Final    Comment: (NOTE) Performed At: [facility] [address] [place], [place] [id number] Lindon Romp MD [id number]    Source (AFB) BRONCHIAL ALVEOLAR LAVAGE  Final  Fungus Culture With Stain     Status: None (Preliminary result)   Collection Time: 12/05/16  9:49 AM  Result Value Ref Range Status    Fungus Stain Final report  Final    Comment: (NOTE) Performed At: [facility] [place], [place] [id number] Lindon Romp MD [id number]    Fungus (Mycology) Culture PENDING  Incomplete   Fungal Source BRONCHIAL ALVEOLAR LAVAGE  Final  Fungus Culture Result     Status: None   Collection Time: 12/05/16  9:49 AM  Result Value Ref Range Status   Result 1 Comment  Final    Comment: (NOTE) KOH/Calcofluor preparation:  no fungus observed. Performed At: [facility] [place], [place] [id number] Lindon Romp MD [id number]   CSF culture     Status: None   Collection Time: 12/06/16  4:54 PM  Result Value Ref Range Status   Specimen Description CSF  Final   Special Requests NONE  Final   Gram Stain   Final    WBC PRESENT,BOTH PMN AND MONONUCLEAR NO ORGANISMS SEEN CYTOSPIN    Culture NO GROWTH 3 DAYS  Final   Report Status 12/09/2016 FINAL  Final  Culture, blood (routine x 2)     Status: None   Collection Time: 12/08/16  9:37 AM  Result Value Ref Range Status   Specimen Description BLOOD RIGHT ANTECUBITAL  Final   Special Requests BOTTLES DRAWN AEROBIC AND ANAEROBIC 5CC  Final   Culture NO GROWTH 5 DAYS  Final   Report Status 12/13/2016 FINAL  Final  Culture, blood (routine x 2)     Status: None   Collection Time: 12/08/16  9:41 AM  Result Value Ref Range Status   Specimen Description BLOOD BLOOD RIGHT ARM  Final   Special Requests BOTTLES DRAWN AEROBIC AND ANAEROBIC 5CC  Final   Culture NO GROWTH 5 DAYS  Final   Report Status 12/13/2016 FINAL  Final    Studies/Results: Dg Chest Port 1 View  Result Date: 12/15/2016 CLINICAL DATA:  Pulmonary edema. EXAM: PORTABLE CHEST 1 VIEW COMPARISON:  12/13/2016 and 12/12/2016 FINDINGS: Tracheostomy tube is in place. Left jugular vein catheter is unchanged with the tip at the junction of the jugular vein and left innominate vein. Pulmonary vascular congestion has improved. The infiltrate  and effusion at the left lung base process. New slight atelectasis at the right lung base. IMPRESSION: 1. A persistent infiltrate and effusion at the left lung base, stable. 2. New slight atelectasis at the right lung base. 3. Improved pulmonary vascular prominence. Electronically Signed   By: Lorriane Shire M.D.   On: 12/15/2016 07:34   Dg Abd Portable 1v  Result Date: 12/14/2016 CLINICAL DATA:  Nasogastric tube placement. EXAM: PORTABLE ABDOMEN - 1 VIEW COMPARISON:  12/07/2016 FINDINGS: Nasogastric tube enters the stomach in has its tip at the proximal fourth portion of the duodenum. Upper abdominal bowel gas pattern is unremarkable. Atelectasis persists at the left base. IMPRESSION: Nasogastric tube tip at the proximal fourth portion of the duodenum. Electronically Signed   By: Nelson Chimes M.D.   On: 12/14/2016 16:26    Assessment/Plan:  Active Problems:   COPD (chronic obstructive pulmonary disease) (HCC)   Substance abuse   Genital warts   HIV (human immunodeficiency virus infection) ([place])   AIDS (acquired immune deficiency syndrome) ([place])   Noncompliance   Bacterial meningitis   Hypertensive urgency   Septic shock (HCC)   Transaminitis   Cocaine abuse   Open wound   Acute respiratory failure with hypoxia (HCC)   Altered mental status   Acute encephalopathy   Cerebral embolism with cerebral infarction   Endotracheally intubated   Meningoencephalitis   Neurosyphilis   Herpesviral meningitis   Paraplegia (HCC)   Acute pulmonary edema (HCC)   Transverse myelitis (HCC)   Encounter for orogastric (OG) tube placement   Somnolence   Abdominal distention  Lawrence Kane is a 51 y.o. 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  -Started methylprednisolone 1000 mg [Day 1/5] -Continue acyclovir [Day 12]  HIV:  Continue dolutegravir, emtracitabine-tenofovir,  darunavir/cobicistat, SMP/TMX [PCP prophylaxis].   LOS: 14 days   Charlott Rakes 12/15/2016, 5:10 PM

## 2016-12-15 NOTE — NC FL2 (Signed)
[place] LEVEL OF CARE SCREENING TOOL     IDENTIFICATION  Patient Name: Lawrence Kane Birthdate: 18-Mar-1966 Sex: male Admission Date (Current Location): 12/01/2016  [place] and [place] Number:  Lawrence Kane [id number] [place] and Address:  The [place]. [facility] [place] [address], [place], [place] [address]      Provider Number: [id number]  Attending Physician Name and Address:  Brand Males, MD  Relative Name and Phone Number:  Mother [phone]    Current Level of Care: Hospital Recommended Level of Care: SNF Prior Approval Number:    Date Approved/Denied:   PASRR Number: [id number]  Discharge Plan: SNF    Current Diagnoses: Patient Active Problem List   Diagnosis Date Noted  . Transverse myelitis ([place])   . Encounter for orogastric (OG) tube placement   . Somnolence   . Paraplegia ([place])   . Acute pulmonary edema (HCC)   . Neurosyphilis   . Herpesviral meningitis   . Cerebral embolism with cerebral infarction 12/08/2016  . Endotracheally intubated   . Meningoencephalitis   . Acute encephalopathy   . Acute respiratory failure ([place]) 12/02/2016  . Altered mental status   . COPD (chronic obstructive pulmonary disease) ([place]) 12/01/2016  . Substance abuse 12/01/2016  . Genital warts 12/01/2016  . HIV (human immunodeficiency virus infection) ([place]) 12/01/2016  . AIDS (acquired immune deficiency syndrome) ([place]) 12/01/2016  . Noncompliance 12/01/2016  . Bacterial meningitis 12/01/2016  . Hypertensive urgency 12/01/2016  . Septic shock ([place]) 12/01/2016  . Transaminitis 12/01/2016  . Cocaine abuse 12/01/2016  . Open wound 12/01/2016    Orientation RESPIRATION BLADDER Height & Weight      (unable to assess)  Tracheostomy Collar (Fi02-28%; shiley 68m cuffed) Incontinent, Indwelling catheter Weight: 204 lb 2.3 oz (92.6 kg) Height:  6' 4"  (193 cm)  BEHAVIORAL SYMPTOMS/MOOD NEUROLOGICAL BOWEL NUTRITION STATUS   (NONE)   Incontinent  Feeding tube (NPO; tube feeding Vital AF 1.2 Cal Continuous 1000 mL; 756mhr)  AMBULATORY STATUS COMMUNICATION OF NEEDS Skin   Total Care Non-Verbally (Intubated) Skin abrasions (non-pressure wound left lower scrotum; ABD dressing- PRN)                       Personal Care Assistance Level of Assistance  Total care       Total Care Assistance: Maximum assistance   Functional Limitations Info  Sight, Hearing, Speech Sight Info: Adequate Hearing Info: Adequate Speech Info: Impaired    SPECIAL CARE FACTORS FREQUENCY                       Contractures Contractures Info: Not present    Additional Factors Info  Code Status, Allergies, Psychotropic, Insulin Sliding Scale, Isolation Precautions, Suctioning Needs Code Status Info: FULL Allergies Info: Iodine; Ketorolac; Tylenol Psychotropic Info: Klonopin Insulin Sliding Scale Info: novoLOG injection 0-20 units every 4 hours; novoLOG injection 4 units every 4 hours; Lantus injection 30 units every 24 hours Isolation Precautions Info: Contact Precautions Suctioning Needs: tracheal catheter- moderate thick secretions   Current Medications (12/15/2016):  This is the current hospital active medication list Current Facility-Administered Medications  Medication Dose Route Frequency Provider Last Rate Last Dose  . 0.9 %  sodium chloride infusion  250 mL Intravenous PRN CaVelna OchsMD 10 mL/hr at 12/15/16 0600 250 mL at 12/15/16 0600  . acetaminophen (TYLENOL) tablet 650 mg  650 mg Oral Q4H PRN CaVelna OchsMD   650 mg at 12/12/16 2139  .  acyclovir (ZOVIRAX) 1,000 mg in sodium chloride 0.9 % 150 mL injection   Intravenous Q8H Brand Males, MD      . aspirin tablet 325 mg  325 mg Per Tube Daily Milagros Loll, MD   325 mg at 12/14/16 1000  . chlorhexidine gluconate (MEDLINE KIT) (PERIDEX) 0.12 % solution 15 mL  15 mL Mouth Rinse BID Laverle Hobby, MD   15 mL at 12/15/16 0819  . chlorproMAZINE (THORAZINE) 25 mg  in sodium chloride 0.9 % 25 mL IVPB  25 mg Intravenous Q8H PRN Florinda Marker, MD   25 mg at 12/07/16 2121  . clonazePAM (KLONOPIN) tablet 2 mg  2 mg Per Tube TID Theone Murdoch Hammons, RPH   2 mg at 12/14/16 2058  . darunavir-cobicistat (PREZCOBIX) 800-150 MG per tablet 1 tablet  1 tablet Oral Q breakfast Brand Males, MD   1 tablet at 12/14/16 1726  . dextrose 10 % infusion   Intravenous Continuous PRN Colbert Coyer, MD      . docusate (COLACE) 50 MG/5ML liquid 100 mg  100 mg Per Tube BID PRN Florinda Marker, MD   100 mg at 12/07/16 1807  . dolutegravir (TIVICAY) tablet 50 mg  50 mg Oral Daily Velna Ochs, MD   50 mg at 12/14/16 1726  . emtricitabine-tenofovir AF (DESCOVY) 200-25 MG per tablet 1 tablet  1 tablet Per Tube Daily Velna Ochs, MD   1 tablet at 12/14/16 1726  . famotidine (PEPCID) 40 MG/5ML suspension 20 mg  20 mg Per Tube BID Haze Boyden, RPH   20 mg at 12/14/16 2057  . feeding supplement (VITAL AF 1.2 CAL) liquid 1,000 mL  1,000 mL Per Tube Continuous Praveen Mannam, MD 75 mL/hr at 12/15/16 0600 1,000 mL at 12/15/16 0600  . fentaNYL (DURAGESIC - dosed mcg/hr) 50 mcg  50 mcg Transdermal Q72H Raylene Miyamoto, MD   50 mcg at 12/14/16 1146  . fentaNYL (SUBLIMAZE) injection 200 mcg  200 mcg Intravenous Once Verner Mould, MD      . fentaNYL (SUBLIMAZE) injection 50 mcg  50 mcg Intravenous Once Alexa Angela Burke, MD      . fentaNYL (SUBLIMAZE) injection 50 mcg  50 mcg Intravenous Q2H PRN Verner Mould, MD   50 mcg at 12/15/16 0553  . furosemide (LASIX) injection 20 mg  20 mg Intravenous Q12H Jose Angelo Radford Pax, MD   20 mg at 12/15/16 0240  . heparin injection 5,000 Units  5,000 Units Subcutaneous Q8H Velna Ochs, MD   5,000 Units at 12/15/16 0504  . hydrALAZINE (APRESOLINE) injection 10 mg  10 mg Intravenous Q6H PRN Praveen Mannam, MD      . ibuprofen (ADVIL,MOTRIN) 100 MG/5ML suspension 400 mg  400 mg Per Tube Q6H PRN Milagros Loll, MD      .  insulin aspart (novoLOG) injection 0-20 Units  0-20 Units Subcutaneous Q4H Canavanas, MD   7 Units at 12/15/16 [phone]  . insulin aspart (novoLOG) injection 4 Units  4 Units Subcutaneous Q4H Colbert Coyer, MD   4 Units at 12/15/16 [phone]  . insulin glargine (LANTUS) injection 30 Units  30 Units Subcutaneous Q24H Anders Simmonds, MD   30 Units at 12/15/16 [phone]  . lactulose (CHRONULAC) 10 GM/15ML solution 45 g  45 g Per Tube TID Assunta Found Stone, RPH   45 g at 12/14/16 2056  . MEDLINE mouth rinse  15 mL Mouth Rinse QID Laverle Hobby,  MD   15 mL at 12/15/16 0400  . midazolam (VERSED) injection 2 mg  2 mg Intravenous Q2H PRN Florinda Marker, MD   2 mg at 12/15/16 0310  . midazolam (VERSED) injection 2 mg  2 mg Intravenous Q4H PRN Verner Mould, MD      . polyethylene glycol (MIRALAX / GLYCOLAX) packet 17 g  17 g Per Tube Daily Clinton, MD   17 g at 12/14/16 1758  . propofol (DIPRIVAN) 500 MG/50ML infusion  5-80 mcg/kg/min Intravenous Once Verner Mould, MD      . sennosides (SENOKOT) 8.8 MG/5ML syrup 5 mL  5 mL Per Tube BID PRN Milagros Loll, MD   5 mL at 12/12/16 1017  . sodium chloride flush (NS) 0.9 % injection 10-40 mL  10-40 mL Intracatheter PRN Brand Males, MD      . sulfamethoxazole-trimethoprim (BACTRIM,SEPTRA) 200-40 MG/5ML suspension 20 mL  20 mL Per Tube Daily Assunta Found Stone, RPH   20 mL at 12/14/16 1719   Facility-Administered Medications Ordered in Other Encounters  Medication Dose Route Frequency Provider Last Rate Last Dose  . chlorhexidine gluconate (MEDLINE KIT) (PERIDEX) 0.12 % solution 15 mL  15 mL Mouth Rinse BID Rennert, MD      . MEDLINE mouth rinse  15 mL Mouth Rinse 10 times per day Rush Landmark, MD         Discharge Medications: Please see discharge summary for a list of discharge medications.  Relevant Imaging Results:  Relevant Lab Results:   Additional Information SS [id number]  Patient has been on trach collar for >24 hrs  Lind Covert, LCSW

## 2016-12-15 NOTE — Progress Notes (Signed)
Pharmacy Antibiotic Note  Lawrence Kane is a 51 y.o. 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  Pharmacy has been consulted for acyclovir dosing. LFTs have been elevated this admission but are improving, renal function remains stable.   Plan: - Continue acyclovir 1000 mg IV q8h - Monitor renal and liver function, clinical progression and length of therapy  Height: _0  (193 cm) Weight: 204 lb 2.3 oz (92.6 kg) IBW/kg (Calculated) : 86.8  Temp (24hrs), Avg:99.4 F (37.4 C), Min:98.6 F (37 C), Max:101 F (38.3 C)   Recent Labs Lab 12/10/16 0424 12/11/16 0330 12/12/16 0319 12/12/16 1630 12/13/16 0219 12/15/16 0430 12/15/16 0439  WBC 10.1 9.6 12.3*  --  10.5 11.9*  --   CREATININE 1.19 0.97 0.83  --  0.82  --  0.79  VANCOTROUGH  --   --   --  21*  --   --   --     Estimated Creatinine Clearance: 134.1 mL/min (by C-G formula based on SCr of 0.79 mg/dL).    Allergies  Allergen Reactions  . Iodine   . Ketorolac     unknown  . Tylenol [Acetaminophen]     unknown    Antimicrobials this admission: Ceftriaxone 1/24 (started at OSH) >> 1/30 Vancomycin 1/24 (started at OSH) >> 1/30; 2/2 >>2/5 Ampicillin 1/25 >>1/27 Acyclovir 1/25 >>1/26; 1/27>> Bactrim 1/25 (PCP prophylaxis) >> 1/29  2/6 >> Meropenem 1/30 >> 2/1; 2/2 >>2/6 Ciprofloxacin 2/1 >>2/2 PCN 2/1 >>2/2  Dose adjustments this admission: VT - 12 1/27 on 1 g q8h VT - 19 1/30 on 1255m q8h: no changes VT  21 2/4 measured - corrects to ~18.5 >> cont 1250 mg/8h  Microbiology results: 1/22 scrotal wound MRSA 1/24 BCx x 2 ([name]: 1/24 HSV PCR ([name]: HSV2 POS 1/24 Cryptococcal ([name]: 1/24 CSF ([name]: gram stain no organism  1/24 Trach asp: ngtd 1/24 MRSA PCR: positive 1/25 BCx x2: ngtd 1/25 cryptococcal: negative 1/25 CSF culture (post abx): ngtd 1/25 CSF fungal Cx: neg 1/25 CSF AFB: neg 1/25 Toxoplasma: neg 1/27 stronglyoids ab:  negative 1/28 fungus from bronch: none observed 1/28 AFB bronch: neg 1/28 PCP bronch: neg 1/28 BAL: 40,000 klebsiella pneumonia ESBL. No parasites 1/29 CSF: ngtd 1/31 BCx: ng<12h 2/1 RPR: Neg  Thank you for allowing pharmacy to be a part of this patient's care.  TDimitri Ped PharmD, BCPS PGY-2 Infectious Diseases Pharmacy Resident Pager: [phone]/05/2017 8:20 AM

## 2016-12-15 NOTE — Clinical Social Work Note (Signed)
Clinical Social Work Assessment  Patient Details  Name: Lawrence Kane MRN: [id number] Date of Birth: [birth year]  Date of referral:  12/15/16               Reason for consult:  Facility Placement, Substance Use/ETOH Abuse                Permission sought to share information with:  Facility Medical sales representativeContact Representative, Family Supports Permission granted to share information::  No (Patient intubated)  Name::     Mother- [phone]; Gavin PoundDeborah (Sister in law) [phone]  Agency::     Relationship::     Contact Information:     Housing/Transportation Living arrangements for the past 2 months:  Apartment Source of Information:  Medical Team, Parent Patient Interpreter Needed:  None Criminal Activity/Legal Involvement Pertinent to Current Situation/Hospitalization:  No - Comment as needed Significant Relationships:  Other Family Members, Parents, Siblings Lives with:  Self Do you feel safe going back to the place where you live?  No Need for family participation in patient care:  Yes (Comment)  Care giving concerns:  Patient presently intubated and will need Vent/Trach SNF at discharge.   Social Worker assessment / plan: Patient is a 51 YO male with HIV/AIDS, genital warts, COPD, and cocaine abuse. S/P tracheostomy yesterday. CSW engaged with Patient's family via T/C to discuss Vent/Trach SNF placement. CSW introduced self and role of CSW. CSW explained to family that CSW will make referrals but notes that Patient may likely be placed out of state due to limited beds in [place]. Patient's family requesting Vent SNF placement in [place] as they live [place], [place]. CSW provided Patient's family with name of Vent SNF facilities. CSW also began explaining that if Patient is placed in [place], family will need to [place]. [place] to Patient's family. CSW explained that Patient will have to go with the first available bed once medically cleared for discharge but CSW will  definitely note family's first choice in Vent/Trach SNF should bed become available prior to Patient discharge. Patient's family agreeable at this time.   Employment status:  Unemployed Health and safety inspectornsurance information:  [place] Recommendations:  Not assessed at this time Information / Referral to community resources:  Other (Comment Required) (VENT SNF facilities)  Patient/Family's Response to care:  Patient's family appreciative of care received at this time. No concerns identified or reported.   Patient/Family's Understanding of and Emotional Response to Diagnosis, Current Treatment, and Prognosis:  Patient's family reports understanding of diagnosis, current treatment, and prognosis. Patient's family prefers [place] SNF. [place]. Family understands that we will work to get Patient close to family, however, if no bed is available at preferred facility, we will have to go with the first available bed.   Emotional Assessment Appearance:  Appears stated age Attitude/Demeanor/Rapport:  Unable to Assess Affect (typically observed):  Unable to Assess Orientation:   (Unable to Assess) Alcohol / Substance use:  Illicit Drugs Psych involvement (Current and /or in the community):  No (Comment)  Discharge Needs  Concerns to be addressed:  Care Coordination, Discharge Planning Concerns Readmission within the last 30 days:  No Current discharge risk:  Chronically ill Barriers to Discharge:  Vent Bed not available   Lew DawesAshley N Rynn Markiewicz, LCSW 12/15/2016, 9:41 AM

## 2016-12-16 ENCOUNTER — Inpatient Hospital Stay (HOSPITAL_COMMUNITY): Payer: Medicaid Other

## 2016-12-16 DIAGNOSIS — Z978 Presence of other specified devices: Secondary | ICD-10-CM

## 2016-12-16 DIAGNOSIS — G9340 Encephalopathy, unspecified: Secondary | ICD-10-CM

## 2016-12-16 DIAGNOSIS — K567 Ileus, unspecified: Secondary | ICD-10-CM

## 2016-12-16 LAB — GLUCOSE, CAPILLARY
GLUCOSE-CAPILLARY: 310 mg/dL — AB (ref 65–99)
GLUCOSE-CAPILLARY: 337 mg/dL — AB (ref 65–99)
GLUCOSE-CAPILLARY: 351 mg/dL — AB (ref 65–99)
GLUCOSE-CAPILLARY: 323 mg/dL — AB (ref 65–99)
GLUCOSE-CAPILLARY: 271 mg/dL — AB (ref 65–99)
Glucose-Capillary: 370 mg/dL — ABNORMAL HIGH (ref 65–99)
Glucose-Capillary: 325 mg/dL — ABNORMAL HIGH (ref 65–99)
Glucose-Capillary: 352 mg/dL — ABNORMAL HIGH (ref 65–99)
Glucose-Capillary: 209 mg/dL — ABNORMAL HIGH (ref 65–99)
Glucose-Capillary: 156 mg/dL — ABNORMAL HIGH (ref 65–99)
Glucose-Capillary: 138 mg/dL — ABNORMAL HIGH (ref 65–99)
Glucose-Capillary: 148 mg/dL — ABNORMAL HIGH (ref 65–99)

## 2016-12-16 LAB — MISC LABCORP TEST (SEND OUT): Labcorp test code: 914415

## 2016-12-16 LAB — CBC
HEMATOCRIT: 31.7 % — AB (ref 39.0–52.0)
Hemoglobin: 9.7 g/dL — ABNORMAL LOW (ref 13.0–17.0)
MCH: 29.7 pg (ref 26.0–34.0)
MCHC: 30.6 g/dL (ref 30.0–36.0)
MCV: 96.9 fL (ref 78.0–100.0)
PLATELETS: 443 10*3/uL — AB (ref 150–400)
RBC: 3.27 MIL/uL — ABNORMAL LOW (ref 4.22–5.81)
RDW: 17.5 % — AB (ref 11.5–15.5)
WBC: 8.8 10*3/uL (ref 4.0–10.5)

## 2016-12-16 LAB — BASIC METABOLIC PANEL
Anion gap: 14 (ref 5–15)
BUN: 42 mg/dL — AB (ref 6–20)
CO2: 22 mmol/L (ref 22–32)
Calcium: 9.5 mg/dL (ref 8.9–10.3)
Chloride: 102 mmol/L (ref 101–111)
Creatinine, Ser: 0.93 mg/dL (ref 0.61–1.24)
GFR calc Af Amer: >60 mL/min (ref >60)
GLUCOSE: 334 mg/dL — AB (ref 65–99)
POTASSIUM: 4 mmol/L (ref 3.5–5.1)
SODIUM: 138 mmol/L (ref 135–145)

## 2016-12-16 MED ORDER — IOPAMIDOL (ISOVUE-300) INJECTION 61%
15.0000 mL | INTRAVENOUS | Status: AC
  Administered 2016-12-16 (×2): 15 mL via ORAL

## 2016-12-16 MED ORDER — INSULIN ASPART 100 UNIT/ML ~~LOC~~ SOLN
4.0000 [IU] | SUBCUTANEOUS | Status: DC
  Administered 2016-12-16: 4 [IU] via SUBCUTANEOUS

## 2016-12-16 MED ORDER — LORAZEPAM 2 MG/ML IJ SOLN
2.0000 mg | Freq: Four times a day (QID) | INTRAMUSCULAR | Status: DC
Start: 2016-12-16 — End: 2016-12-28
  Administered 2016-12-16 – 2016-12-28 (×40): 2 mg via INTRAVENOUS
  Filled 2016-12-16 (×41): qty 1

## 2016-12-16 MED ORDER — INSULIN ASPART 100 UNIT/ML ~~LOC~~ SOLN
8.0000 [IU] | Freq: Once | SUBCUTANEOUS | Status: AC
  Administered 2016-12-16: 8 [IU] via INTRAVENOUS

## 2016-12-16 MED ORDER — BISACODYL 10 MG RE SUPP
10.0000 mg | Freq: Once | RECTAL | Status: DC
Start: 2016-12-16 — End: 2016-12-16

## 2016-12-16 MED ORDER — SODIUM CHLORIDE 0.9 % IV SOLN
INTRAVENOUS | Status: DC
  Administered 2016-12-16: 2.9 [IU]/h via INTRAVENOUS
  Administered 2016-12-17: 1.5 [IU]/h via INTRAVENOUS
  Administered 2016-12-18: 2.3 [IU]/h via INTRAVENOUS
  Filled 2016-12-16 (×3): qty 2.5

## 2016-12-16 MED ORDER — DEXMEDETOMIDINE HCL IN NACL 200 MCG/50ML IV SOLN
0.0000 ug/kg/h | INTRAVENOUS | Status: DC
  Administered 2016-12-16 (×3): 1 ug/kg/h via INTRAVENOUS
  Administered 2016-12-16: 0.8 ug/kg/h via INTRAVENOUS
  Administered 2016-12-16: 0.6 ug/kg/h via INTRAVENOUS
  Administered 2016-12-16: 1 ug/kg/h via INTRAVENOUS
  Filled 2016-12-16 (×7): qty 50

## 2016-12-16 MED ORDER — MIDAZOLAM HCL 2 MG/2ML IJ SOLN
2.0000 mg | INTRAMUSCULAR | Status: DC | PRN
  Administered 2016-12-19: 2 mg via INTRAVENOUS
  Filled 2016-12-16: qty 2

## 2016-12-16 MED ORDER — DEXMEDETOMIDINE BOLUS VIA INFUSION
1.0000 ug/kg | Freq: Once | INTRAVENOUS | Status: DC
  Filled 2016-12-16: qty 93

## 2016-12-16 MED ORDER — HALOPERIDOL LACTATE 5 MG/ML IJ SOLN
5.0000 mg | Freq: Four times a day (QID) | INTRAMUSCULAR | Status: DC | PRN
  Administered 2016-12-21 – 2016-12-22 (×3): 5 mg via INTRAVENOUS
  Filled 2016-12-16 (×3): qty 1

## 2016-12-16 MED ORDER — INSULIN ASPART 100 UNIT/ML ~~LOC~~ SOLN
0.0000 [IU] | SUBCUTANEOUS | Status: DC
  Administered 2016-12-16: 15 [IU] via SUBCUTANEOUS

## 2016-12-16 MED ORDER — INSULIN ASPART 100 UNIT/ML ~~LOC~~ SOLN
10.0000 [IU] | Freq: Once | SUBCUTANEOUS | Status: AC
  Administered 2016-12-16: 10 [IU] via INTRAVENOUS

## 2016-12-16 MED ORDER — ETOMIDATE 2 MG/ML IV SOLN
INTRAVENOUS | Status: AC
  Administered 2016-12-16: 20 mg
  Filled 2016-12-16: qty 10

## 2016-12-16 MED ORDER — INSULIN GLARGINE 100 UNIT/ML ~~LOC~~ SOLN
35.0000 [IU] | Freq: Two times a day (BID) | SUBCUTANEOUS | Status: DC
  Administered 2016-12-16: 35 [IU] via SUBCUTANEOUS
  Filled 2016-12-16: qty 0.35

## 2016-12-16 NOTE — Progress Notes (Signed)
eLink Physician-Brief Progress Note Patient Name: [name] DOB: Jul 14, 1966 MRN: [id number]   Date of Service  12/16/2016  HPI/Events of Note    eICU Interventions  Lantus and SSI adjusted     Intervention Category Major Interventions: Hyperglycemia - active titration of insulin therapy  Merwyn KatosDavid B Simonds 12/16/2016, 3:39 AM

## 2016-12-16 NOTE — Procedures (Signed)
NGT by MD  Placed a lubricated left nares NGT No bleeding No resistance  Air heard by me  Mcarthur Rossettianiel J. Tyson AliasFeinstein, MD, FACP Pgr: [phone] [facility]

## 2016-12-16 NOTE — Progress Notes (Signed)
[facility] PROGRESS NOTE  Lawrence Kane ZDG:[id number] DOB: 1966/03/25 DOA: 12/01/2016  PCP: No primary care provider on file.  Brief History/Interval Summary: 51 year old man with HIV/AIDS (first diagnosed 2003, last CD4 count 245 with viremia of 1,160 in October 2017; on Genovya, Darunavir and Bactrim), Genital Warts, COPD, and cocaine substance abuse who presented to [facility] on 1/24 with complaint of altered mental status. Per notes, patient may have recently had some type of surgery in [place] where his mother resides. Afebrile, tachycardic to 155, RR 18, hypertensive to 240/112. Patient was intubated for airway protection, CVL in IJ was placed, and LP was performed given his altered mental status. ID was consulted. Patient was placed on antibiotics. He subsequently had to undergo tracheostomy.   Reason for Visit: Meningitis. Possible transverse myelitis  Consultants: Infectious disease. Critical care medicine. Neurology.  STUDIES:  1/24 CXR >> ATX vs LLL infiltrate 1/25 CT head >> neg for acute intracranial process 1/25 LP cytology >> no malignant cells 1/25 LP >> yellow, cloudy, glucose 20, total protein >600, RBC 114>49, WBC 970, 36% neutrophil, 20% lymphs, 19% eos 1/28 BAL cell count >> 266 WBC, eos 1 1/28 BAL cytology >> no malignant cells 1/27 BAL for wet mount> neg for parasites 1/27 MRI brain >> small acute infarcts in cerebellum and cerebral cortex, sucal signal 1/29 LP >> Glucose 82, protein >600, WBC 600>885, eosinophils 28% 1/29 Echo >> LV EF 60-65%, indeterminent diastolic function, wall motion normal 1/30 EEG >> diffuse slowing and background suppression, no seizures 2/3 MRI thoracic/lumbar/cervical >> (-) abscess. Meningitis. R/O CMV   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 1/24 Cryptococcal [name]): negative 1/24 CSF [facility]): NGTD 1/22 scrotal wound cx [name]): MRSA Toxoplasma [name]):  Negative 1/25BCx x2 : NGTD 1/25 HCV ab + 1/24 Trach asp 1/24: NGTD 1/24MRSA PCR: MRSA pos 1/25 HIV quant: 80, CD4 10 1/25 CSF AFB: Smear neg, >> 1/25 CSF fungal cx: NGTD 1/25 CSF crypto ag: neg 1/25 CSF gram stain no organisms, cx: NGTD 1/27 strongyloides ab >> negative 1/27 coccidiomycosis ab >> 1/28 BAL AFB smear/cx> 1/28 BAL Fungus cx >> 1/28 BAL gram stain/Cx: 40K ESBL Klebsiella pneumoniae 1/28 BAL pneumocystis smear: Neg 1/27 O&P >> 1/29 CSF Cx >> no organisms on gram stain, >> NGTD 2/04 CSF CMV >> not done?   ANTIBIOTICS: Rocephin 1/24>>1/30 Vancomycin 1/24>>1/30; 2/2 >  Acyclovir 1/24>> 1/26, 1/27>> Ampicillin 1/24>> 1/27 Bactrim M/W/F for PCP prophylaxis Meropenem 1/30>> 2/1; 2/2 >  Pen G 2/1 >> 2/2 Cipro 2/1 >> 2/2  SIGNIFICANT EVENTS: 1/24  Transferred from [facility] 1/25  Underwent LP here 2/05  Trach 2/06  To ATC  LINES/TUBES: L IJ CVL 1/24 >> ETT 1/24 >> 2/5 Foley 1/24 >> OGT 1/24 >> 2/5 Trach (df) 2/5 >>   Subjective/Interval History: Patient with tracheostomy. Patient was placed on Precedex infusion last night due to agitation. He is sedated.  ROS: Unable to do.  Objective:  Vital Signs  Vitals:   12/16/16 0615 12/16/16 0630 12/16/16 0645 12/16/16 0700  BP: 121/78 128/83 126/85 124/82  Pulse: 62 61 61 61  Resp: (!) 30 (!) 29 (!) 30 (!) 27  Temp:      TempSrc:      SpO2: 93% 95% 94% 95%  Weight:      Height:        Intake/Output Summary (Last 24 hours) at 12/16/16 0743 Last data filed at 12/16/16 0700  Gross per 24 hour  Intake          1069.04 ml  Output             1505 ml  Net          -435.96 ml   Filed Weights   12/14/16 0125 12/15/16 0331 12/16/16 0500  Weight: 103.5 kg (228 lb 2.8 oz) 92.6 kg (204 lb 2.3 oz) 96.6 kg (212 lb 15.4 oz)    General appearance: Patient is sedated. Does not appear to be in any distress. Neck: Tracheostomy noted Resp: Coarse breath sounds bilaterally. No wheezing, rales or rhonchi.  Diminished at the bases. Cardio: regular rate and rhythm, S1, S2 normal, no murmur, click, rub or gallop GI: Abdomen is very distended. Firm. Appears to be nontender. Bowel sounds present. No masses or organomegaly. Extremities: Minimal edema noted bilateral lower extremities Pulses: 2+ and symmetric Neurologic: Patient is sedated.  Lab Results:  Data Reviewed: I have personally reviewed following labs and imaging studies  CBC:  Recent Labs Lab 12/11/16 0330 12/12/16 0319 12/13/16 0219 12/15/16 0430 12/16/16 0239  WBC 9.6 12.3* 10.5 11.9* 8.8  HGB 8.5* 9.0* 8.4* 9.3* 9.7*  HCT 27.2* 29.0* 26.7* 29.6* 31.7*  MCV 97.5 97.0 97.8 97.4 96.9  PLT 286 356 321 396 443*    Basic Metabolic Panel:  Recent Labs Lab 12/10/16 0424 12/11/16 0330 12/12/16 0319 12/13/16 0219 12/15/16 0439 12/16/16 0239  NA 131* 133* 133* 136 138 138  K 4.7 4.5 5.2* 4.1 3.5 4.0  CL 93* 93* 90* 97* 97* 102  CO2 31 33* 34* 35* 30 22  GLUCOSE 180* 211* 245* 223* 219* 334*  BUN 29* 25* 23* 24* 28* 42*  CREATININE 1.19 0.97 0.83 0.82 0.79 0.93  CALCIUM 7.8* 8.2* 8.1* 8.4* 9.1 9.5  MG 2.1 1.9 1.7 2.0  --   --   PHOS 2.7 3.6 3.2 2.6 3.8  --     GFR: Estimated Creatinine Clearance: 115.4 mL/min (by C-G formula based on SCr of 0.93 mg/dL).  Liver Function Tests:  Recent Labs Lab 12/10/16 0424 12/11/16 0330 12/12/16 0319 12/13/16 0219 12/15/16 0439  AST 418* 322* 197* 126*  --   ALT 397* 391* 350* 248*  --   ALKPHOS 83 94 101 93  --   BILITOT 1.2 0.9 0.9 0.7  --   PROT 7.2 7.4 8.4* 7.6  --   ALBUMIN 1.7* 1.6* 1.8* 1.7* 1.9*     Coagulation Profile:  Recent Labs Lab 12/13/16 1433  INR 0.99    CBG:  Recent Labs Lab 12/15/16 1142 12/15/16 1603 12/15/16 1938 12/15/16 2327 12/16/16 0330  GLUCAP 255* 184* 320* 344* 310*     Recent Results (from the past 240 hour(s))  CSF culture     Status: None   Collection Time: 12/06/16  4:54 PM  Result Value Ref Range Status   Specimen  Description CSF  Final   Special Requests NONE  Final   Gram Stain   Final    WBC PRESENT,BOTH PMN AND MONONUCLEAR NO ORGANISMS SEEN CYTOSPIN    Culture NO GROWTH 3 DAYS  Final   Report Status 12/09/2016 FINAL  Final  Culture, blood (routine x 2)     Status: None   Collection Time: 12/08/16  9:37 AM  Result Value Ref Range Status   Specimen Description BLOOD RIGHT ANTECUBITAL  Final   Special Requests BOTTLES DRAWN AEROBIC AND ANAEROBIC 5CC  Final   Culture NO GROWTH 5 DAYS  Final   Report  Status 12/13/2016 FINAL  Final  Culture, blood (routine x 2)     Status: None   Collection Time: 12/08/16  9:41 AM  Result Value Ref Range Status   Specimen Description BLOOD BLOOD RIGHT ARM  Final   Special Requests BOTTLES DRAWN AEROBIC AND ANAEROBIC 5CC  Final   Culture NO GROWTH 5 DAYS  Final   Report Status 12/13/2016 FINAL  Final      Radiology Studies: Dg Chest Port 1 View  Result Date: 12/15/2016 CLINICAL DATA:  Pulmonary edema. EXAM: PORTABLE CHEST 1 VIEW COMPARISON:  12/13/2016 and 12/12/2016 FINDINGS: Tracheostomy tube is in place. Left jugular vein catheter is unchanged with the tip at the junction of the jugular vein and left innominate vein. Pulmonary vascular congestion has improved. The infiltrate and effusion at the left lung base process. New slight atelectasis at the right lung base. IMPRESSION: 1. A persistent infiltrate and effusion at the left lung base, stable. 2. New slight atelectasis at the right lung base. 3. Improved pulmonary vascular prominence. Electronically Signed   By: Lorriane Shire M.D.   On: 12/15/2016 07:34   Dg Abd Portable 1v  Result Date: 12/14/2016 CLINICAL DATA:  Nasogastric tube placement. EXAM: PORTABLE ABDOMEN - 1 VIEW COMPARISON:  12/07/2016 FINDINGS: Nasogastric tube enters the stomach in has its tip at the proximal fourth portion of the duodenum. Upper abdominal bowel gas pattern is unremarkable. Atelectasis persists at the left base. IMPRESSION:  Nasogastric tube tip at the proximal fourth portion of the duodenum. Electronically Signed   By: Nelson Chimes M.D.   On: 12/14/2016 16:26     Medications:  Scheduled: . small volume/piggyback builder   Intravenous Q8H  . aspirin  325 mg Per Tube Daily  . chlorhexidine gluconate (MEDLINE KIT)  15 mL Mouth Rinse BID  . clonazePAM  2 mg Per Tube TID  . darunavir-cobicistat  1 tablet Oral Q breakfast  . dexmedetomidine  1 mcg/kg Intravenous Once  . dolutegravir  50 mg Oral Daily  . emtricitabine-tenofovir AF  1 tablet Per Tube Daily  . famotidine  20 mg Per Tube BID  . fentaNYL  50 mcg Transdermal Q72H  . furosemide  20 mg Intravenous Q12H  . heparin  5,000 Units Subcutaneous Q8H  . insulin aspart  0-20 Units Subcutaneous Q4H  . insulin aspart  0-20 Units Subcutaneous Q4H  . insulin aspart  4 Units Subcutaneous Q4H  . insulin glargine  35 Units Subcutaneous BID  . lactulose  45 g Per Tube TID  . mouth rinse  15 mL Mouth Rinse QID  . methylPREDNISolone (SOLU-MEDROL) injection  500 mg Intravenous Q12H  . polyethylene glycol  17 g Per Tube Daily  . sulfamethoxazole-trimethoprim  20 mL Per Tube Daily   Continuous: . dexmedetomidine 1 mcg/kg/hr (12/16/16 0706)  . feeding supplement (VITAL AF 1.2 CAL) 1,000 mL (12/15/16 1600)   RXV:QMGQQP chloride, acetaminophen, chlorproMAZINE (THORAZINE) IV, docusate, fentaNYL (SUBLIMAZE) injection, hydrALAZINE, ibuprofen, midazolam, midazolam, sennosides, sodium chloride flush  Assessment/Plan:  Active Problems:   COPD (chronic obstructive pulmonary disease) (HCC)   Substance abuse   Genital warts   HIV (human immunodeficiency virus infection) ([place])   AIDS (acquired immune deficiency syndrome) ([place])   Noncompliance   Bacterial meningitis   Hypertensive urgency   Septic shock (HCC)   Transaminitis   Cocaine abuse   Open wound   Acute respiratory failure with hypoxia (HCC)   Altered mental status   Acute encephalopathy   Cerebral embolism  with cerebral  infarction   Endotracheally intubated   Meningoencephalitis   Neurosyphilis   Herpesviral meningitis   Paraplegia ([place])   Acute pulmonary edema (HCC)   Transverse myelitis ([place])   Encounter for orogastric (OG) tube placement   Somnolence   Abdominal distention    Acute Hypoxemic Respiratory Failure  This was secondary to HCAP ESBL Klebsiella LLL+ sepsis + Unable to protect airway + pulm edema. Patient was on the ventilator for a prolonged duration. Subsequently underwent tracheostomy placement on 2/5. Pulmonology is following. Patient became very agitated yesterday and had to be placed on Precedex infusion. Pulmonology is managing.  Meningitis bacterial + viral. No organisms isolated except for HSV. Differentials : fungal infxn, septic emboli, neurosyphillis, CMV based on MRI. HSV positive on CSF from [facility]. Infectious disease is following. Patient is on acyclovir. Off of meropenem. Also on Bactrim for PCP prophylaxis.  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 Patient is on acyclovir. High-dose steroids, started today. Neurology is following. On fentanyl patch for pain issues. Started during this hospitalization.  Abdominal distention/ileus Abdomen noted to be very distended today. Communicated with the nurse. Abdominal films ordered. Ileus was noted. Concern for small bowel obstruction. CT abdomen has been ordered. Patient has been having stool according to nursing staff. WBC is normal. Continue to monitor for now. If he develops watery stool, then we may have to pursue infectious etiology.  Pulm edema Seems to have improved. Continues to be on Lasix, which will be continued for now.   Moderate to Severe Protein Calorie Malnutrition  On tube feedings  Transaminitis Worsened by seroquel likely which was discontinued on 1/31.  Improved.   HCV ab+, Chronic hep b Management will be pursued as outpatient.  Normocytic  anemia Hemoglobin stable. No evidence for overt bleeding. Continue to monitor.  LLL PNA with ESBL Klebsiella  Completed treatment. Monitor WBC.  HIV On antiretroviral treatment. Infectious disease is following.  Hyperglycemia Unknown if patient has a history of diabetes. CBGs remain significantly elevated. This is most likely due to high-dose steroids. May have to initiate insulin infusion. Currently on Lantus. HbA1c 6.6 on 1/26.   Metabolic Encephalopathy Continue to monitor. Seems to be stable. On clonazepam.  History of Cocaine Abuse Will need counseling  Small acute CVA in cerebellum and cerebral cortex Stroke could be due to vasospasm from meningitis. On aspirin. Echocardiogram shows normal systolic function. LDL 93. PT and OT evaluation when more improved. No significant carotid stenosis noted on MRA neck.   DVT Prophylaxis: Subcutaneous heparin    Code Status: Full code  Family Communication: No family at bedside  Disposition Plan: Discussed with Dr. Titus Mould with critical care medicine. They will continue to follow the patient closely. If patient is able to come off of the Precedex infusion, he will be transferred to stepdown unit.    LOS: 15 days   [place] [facility] Pager [phone] 12/16/2016, 7:43 AM  If 7PM-7AM, please contact night-coverage at [email or web address], password [facility]

## 2016-12-16 NOTE — Progress Notes (Signed)
S: Called by RN to assess pt's increased WOB and decreased O2 sats. Additionally, pt's abd distended and firm.  RN reports TF was stopped, but pt has cortrek which cannot be put to suction. Last BM was today, but was mushy. Pt has had a BM daily. Abd xray showed concern for ileus of possible early SBO.  Additionally, pt's BGL has remained elevated in the 300's despite insulin of 8 units and 10 units. Pt is on IV solumedrol.  Vitals:   12/16/16 1100 12/16/16 1200 12/16/16 1207 12/16/16 1212  BP: 124/83 119/78 119/78   Pulse: 62 63 66   Resp: (!) 32 (!) 37 (!) 38   Temp:    99 F (37.2 C)  TempSrc:    Oral  SpO2: 91% (!) 89% 93%   Weight:      Height:        O: Gen: Adult M in NAD on vent Neuro: Sedated. Does not awake to voice CV: rrr with normal s1s2. No m/r/g Pulm: tachypenic. Non labored. CTAB Abd: distended. Hypoactive BS. nontender Ext: no edema Skin: warm, dry, intact   A/P:  Abd distention/bowel ileus R/o obstruction appears to be at risk perforation - place NG for suction, can keep feeding tube in  Place but now use - no reglan until we r/o obstruction - keep NPO for now - continue lactulose, may need to hold -CT abdo pelvis -lactic, ldh -may need cdiff assessment Avoid fent as able  Tachypnea secondary to abdomen - likely due to abd distention - Monitor , but dc weaning until abdo improved  DM hyperglycemia - Start insulin gtt while on IV steroid   Mcarthur RossettiDaniel J. Tyson AliasFeinstein, MD, FACP Pgr: [phone] [place] [facility]

## 2016-12-16 NOTE — Evaluation (Signed)
Physical Therapy Evaluation Patient Details Name: Lawrence Kane MRN: [id number] DOB: Nov 10, 1965 Today's Date: 12/16/2016   History of Present Illness  51 yo [facility] with AMS and intubated. Pt with sepsis and transaminitis, 1/27 noted bil small cerebellar infarcts, trach 2/5. Pt with bil LE flaccid since admission with unclear source as this time. PMHx:HIV/AIDS, COPD, cocaine use   Clinical Impression  Pt with trach on vent 40% throughout session, mouthing words but unable to discern statements through most of his statements, at times with clear nodding to questions. Pt currently with no motor function noted of bil LE, decreased balance, cognition, function, transfers who will benefit from acute therapy to maximize mobility, strength, function and transfers. Pt requires high level of assist and multimodal cues throughout. Pt positioned with bil prevalon and kickstand portion of prevalon to position legs in midline, pt with natural tendency for bil hip external rotation.   sats 96% on vent HR 67 with activity    Follow Up Recommendations LTACH;CIR (pending respiratory function)    Equipment Recommendations  Wheelchair (measurements PT);Wheelchair cushion (measurements PT);Hospital bed    Recommendations for Other Services       Precautions / Restrictions Precautions Precautions: Fall Precaution Comments: vent, panda, restraints, bil LE flaccid Restrictions Weight Bearing Restrictions: No      Mobility  Bed Mobility Overal bed mobility: Needs Assistance Bed Mobility: Supine to Sit;Sit to Supine     Supine to sit: HOB elevated;Total assist;+2 for physical assistance;+2 for safety/equipment Sit to supine: Total assist;+2 for safety/equipment;+2 for physical assistance   General bed mobility comments: pt with total assist to bring bil LE off of and onto bed, rotate trunk with semirolling and side to sit as well as max assist for trunk elevation and  control to surface. Use of pad to rotate pelvis and scoot to EOB. total assist +[facility]  Transfers                 General transfer comment: unsafe to attempt at this time  Ambulation/Gait                Stairs            Wheelchair Mobility    Modified Rankin (Stroke Patients Only) Modified Rankin (Stroke Patients Only) Pre-Morbid Rankin Score: No symptoms Modified Rankin: Severe disability     Balance Overall balance assessment: Needs assistance   Sitting balance-Leahy Scale: Poor Sitting balance - Comments: pt with highly varied leaning in sitting with flexed trunk and tendency for left lean but with RUE support on rail able to maintain flexed posture at midline. EOB grossly 7 min Postural control: Posterior lean;Right lateral lean;Left lateral lean                                   Pertinent Vitals/Pain Pain Assessment: No/denies pain    Home Living Family/patient expects to be discharged to:: Unsure                 Additional Comments: Pt states he was living with someone and caring for himself. No family present and unable to ascertain further PLOF due to trach/vent    Prior Function                 Hand Dominance        Extremity/Trunk Assessment   Upper Extremity Assessment Upper Extremity Assessment:  RUE deficits/detail;LUE deficits/detail RUE Deficits / Details: RUE with grossly 3/5 strength, lack of coordination and undershooting when aiming for target LUE Deficits / Details: generalized weakness, grossly 3/5 did not present with decreased coordination and undershooting like RUE    Lower Extremity Assessment Lower Extremity Assessment: RLE deficits/detail;LLE deficits/detail RLE Deficits / Details: no AROM or withdrawal to painful stimuli, PROM WFL LLE Deficits / Details: no AROM or withdrawal to painful stimuli, PROM WFL    Cervical / Trunk Assessment Cervical / Trunk Assessment: Other  exceptions Cervical / Trunk Exceptions: pt unable to maintain extended trunk due to balance   Communication   Communication: Tracheostomy  Cognition Arousal/Alertness: Awake/alert Behavior During Therapy: WFL for tasks assessed/performed Overall Cognitive Status: Difficult to assess                      General Comments      Exercises     Assessment/Plan    PT Assessment Patient needs continued PT services  PT Problem List Decreased strength;Decreased mobility;Decreased safety awareness;Decreased range of motion;Decreased coordination;Decreased activity tolerance;Cardiopulmonary status limiting activity;Decreased knowledge of use of DME;Decreased balance;Impaired sensation          PT Treatment Interventions DME instruction;Therapeutic activities;Therapeutic exercise;Patient/family education;Balance training;Functional mobility training;Neuromuscular re-education    PT Goals (Current goals can be found in the Care Plan section)  Acute Rehab PT Goals PT Goal Formulation: Patient unable to participate in goal setting Time For Goal Achievement: 12/30/16 Potential to Achieve Goals: Fair    Frequency Min 3X/week   Barriers to discharge   unsure of home environment or caregiver availability    Co-evaluation               End of Session   Activity Tolerance: Patient tolerated treatment well Patient left: in bed;with call bell/phone within reach;with restraints reapplied Nurse Communication: Mobility status;Precautions;Need for lift equipment         Time: 9562-1308 PT Time Calculation (min) (ACUTE ONLY): 28 min   Charges:   PT Evaluation $PT Eval High Complexity: 1 Procedure     PT G Codes:        Aldean Pipe B Jerre Diguglielmo 12/19/2016, 10:12 AM Delaney Meigs, PT [phone]

## 2016-12-16 NOTE — Care Management Note (Signed)
Case Management Note  Patient Details  Name: Lawrence NorthDarren E Kane MRN: [id number] Date of Birth: [birth year]  Subjective/Objective:     Pt admitted with bacterial menegitis               Action/Plan: PTA from home - pt remains ventilated. Bacterial meningitis, possible parasitic given elevated eosinophils, also would consider coccidiodes. Possible LLL PNA, HIV > CD4 10  . Plan is to keep pt on full support until Monday 12/06/16 to allow for possible recovery - tentative GOC meeting scheduled Monday.  Mom and sister are POC.  CM will continue to follow for discharge needs   Expected Discharge Date:                  Expected Discharge Plan:     In-House Referral:  Clinical Social Work  Discharge planning Services  CM Consult  Post Acute Care Choice:    Choice offered to:     DME Arranged:    DME Agency:     HH Arranged:    HH Agency:     Status of Service:  In process, will continue to follow  If discussed at Long Length of Stay Meetings, dates discussed:    Additional Comments: 12/16/2016 Pt is not eligible at this time for CIR consideration due to ventilator  12/14/16 Discussed in LOS 12/14/16 - pt remains appropriate for continued stay.  Pt was trached yesterday - remains ventilated via trach.  Discharge more probable SNF as pt is not eligible for LTACH due to insurance coverage and lack of support in the home.  Pts support system includes mom and sister.  CSW consulted for potential discharge to SNF [name], [name], RN 12/16/2016, 2:01 PM

## 2016-12-16 NOTE — Progress Notes (Signed)
Inpatient Diabetes Program Recommendations  AACE/ADA: New Consensus Statement on Inpatient Glycemic Control (2015)  Target Ranges:  Prepandial:   less than 140 mg/dL      Peak postprandial:   less than 180 mg/dL (1-2 hours)      Critically ill patients:  140 - 180 mg/dL   Lab Results  Component Value Date   GLUCAP 370 (H) 12/16/2016   HGBA1C 6.6 (H) 12/03/2016    Review of Glycemic Control:  Results for Lawrence Kane, Lawrence E (MRN [id number]) as of 12/16/2016 11:29  Ref. Range 12/15/2016 16:03 12/15/2016 19:38 12/15/2016 23:27 12/16/2016 03:30 12/16/2016 08:19  Glucose-Capillary Latest Ref Range: 65 - 99 mg/dL 409184 (H) 811320 (H) 914344 (H) 310 (H) 370 (H)    Inpatient Diabetes Program Recommendations:    Note that blood sugars increased with IV solumedrol.  If appropriate consider adding IV insulin while patient is on IV solumedrol.    Thanks, Beryl MeagerJenny Shekela Goodridge, RN, BC-ADM Inpatient Diabetes Coordinator Pager [phone] (8a-5p)

## 2016-12-16 NOTE — Progress Notes (Signed)
Pt has increased work of breathing and O2 sat of 88%, resp. rate 40.  RT increased vent O2 to 60%.  Pt abd looks more distended and firm, tube feeding stopped. Portable abdomen xray concerning for ileus or possibly early bowel obstruction.  Dr. Tyson AliasFeinstein paged and notified.

## 2016-12-16 NOTE — Progress Notes (Signed)
Pt CBG 325 after 8 units insulin given IV.  Order to give 10 units IV by Dr. Tyson AliasFeinstein.  Rechecked 1 hr after,  CBG 337.  MD notified.

## 2016-12-16 NOTE — Progress Notes (Signed)
Attempted to insert NG tube.  Patient followed instructions to swallow.  Tube inserted to appropriate length but came out of his mouth.  After pulling back on the tube, there was blood inside.  Attempted to have patient swallow again, unsuccessfully.  Removed tube completely, moderate blood in tube.  Patient has a partially new tracheostomy.  Would not advise re attempting d/t risk of injury.  Will discuss with MD.

## 2016-12-16 NOTE — Progress Notes (Addendum)
eLink Physician-Brief Progress Note Patient Name: Patrick NorthDarren E Bamford DOB: [birth year] MRN: [id number]   Date of Service  12/16/2016  HPI/Events of Note    eICU Interventions  Initiate dexmedetomidine gtt     Intervention Category Major Interventions: Delirium, psychosis, severe agitation - evaluation and management  Merwyn KatosDavid B Simonds 12/16/2016, 12:41 AM

## 2016-12-16 NOTE — Progress Notes (Signed)
Subjective: This morning, he was tachypneic and appeared uncomfortable.     Antibiotics:  Anti-infectives    Start     Dose/Rate Route Frequency Ordered Stop   12/14/16 1515  sulfamethoxazole-trimethoprim (BACTRIM,SEPTRA) 200-40 MG/5ML suspension 20 mL     20 mL Per Tube Daily 12/14/16 1508     12/13/16 1800  sulfamethoxazole-trimethoprim (BACTRIM DS,SEPTRA DS) 800-160 MG per tablet 1 tablet  Status:  Discontinued     1 tablet Oral Daily 12/13/16 1753 12/14/16 1508   12/12/16 1800  acyclovir (ZOVIRAX) 1,000 mg in sodium chloride 0.9 % 150 mL injection     150 mL/hr over 60 Minutes Intravenous Every 8 hours 12/12/16 1410     12/10/16 1400  meropenem (MERREM) 2 g in sodium chloride 0.9 % 100 mL IVPB  Status:  Discontinued     2 g 200 mL/hr over 30 Minutes Intravenous Every 8 hours 12/10/16 1140 12/14/16 1506   12/10/16 1300  vancomycin (VANCOCIN) 1,250 mg in sodium chloride 0.9 % 250 mL IVPB  Status:  Discontinued     1,250 mg 166.7 mL/hr over 90 Minutes Intravenous Every 8 hours 12/10/16 1140 12/13/16 0824   12/09/16 1000  penicillin G potassium 4 Million Units in dextrose 5 % 250 mL IVPB  Status:  Discontinued     4 Million Units 250 mL/hr over 60 Minutes Intravenous Every 4 hours 12/09/16 0851 12/10/16 1109   12/09/16 1000  ciprofloxacin (CIPRO) IVPB 400 mg  Status:  Discontinued     400 mg 200 mL/hr over 60 Minutes Intravenous Every 12 hours 12/09/16 0929 12/10/16 1109   12/07/16 2245  meropenem (MERREM) 2 g in sodium chloride 0.9 % 100 mL IVPB  Status:  Discontinued     2 g 200 mL/hr over 30 Minutes Intravenous Every 8 hours 12/07/16 2236 12/09/16 0851   12/04/16 2200  vancomycin (VANCOCIN) 1,250 mg in sodium chloride 0.9 % 250 mL IVPB  Status:  Discontinued     1,250 mg 166.7 mL/hr over 90 Minutes Intravenous Every 8 hours 12/04/16 1421 12/07/16 1057   12/04/16 1000  acyclovir (ZOVIRAX) 1,000 mg in dextrose 5 % 150 mL IVPB  Status:  Discontinued     1,000 mg 170  mL/hr over 60 Minutes Intravenous Every 8 hours 12/04/16 0943 12/12/16 1409   12/03/16 0900  acyclovir (ZOVIRAX) 1,080 mg in dextrose 5 % 250 mL IVPB  Status:  Discontinued     1,080 mg 271.6 mL/hr over 60 Minutes Intravenous Every 8 hours 12/03/16 0651 12/03/16 0843   12/03/16 0900  acyclovir (ZOVIRAX) 1,000 mg in dextrose 5 % 150 mL IVPB  Status:  Discontinued     1,000 mg 170 mL/hr over 60 Minutes Intravenous Every 8 hours 12/03/16 0843 12/03/16 1002   12/02/16 1015  darunavir-cobicistat (PREZCOBIX) 800-150 MG per tablet 1 tablet     1 tablet Oral Daily with breakfast 12/02/16 1003     12/02/16 1000  emtricitabine-tenofovir AF (DESCOVY) 200-25 MG per tablet 1 tablet     1 tablet Per Tube Daily 12/01/16 2320     12/02/16 1000  dolutegravir (TIVICAY) tablet 50 mg     50 mg Oral Daily 12/01/16 2320     12/02/16 1000  vancomycin (VANCOCIN) 1,250 mg in sodium chloride 0.9 % 250 mL IVPB  Status:  Discontinued     1,250 mg 166.7 mL/hr over 90 Minutes Intravenous Every 24 hours 12/02/16 0010 12/02/16 0634   12/02/16 0900  acyclovir (  ZOVIRAX) 930 mg in dextrose 5 % 150 mL IVPB  Status:  Discontinued     10 mg/kg  93 kg 168.6 mL/hr over 60 Minutes Intravenous Every 8 hours 12/02/16 0632 12/03/16 0651   12/02/16 0700  vancomycin (VANCOCIN) IVPB 1000 mg/200 mL premix  Status:  Discontinued     1,000 mg 200 mL/hr over 60 Minutes Intravenous Every 8 hours 12/02/16 0634 12/04/16 1421   12/02/16 0400  cefTRIAXone (ROCEPHIN) 2 g in dextrose 5 % 50 mL IVPB  Status:  Discontinued     2 g 100 mL/hr over 30 Minutes Intravenous Every 12 hours 12/02/16 0010 12/07/16 2222   12/02/16 0100  acyclovir (ZOVIRAX) 1,110 mg in dextrose 5 % 250 mL IVPB  Status:  Discontinued     10 mg/kg  111.1 kg 272.2 mL/hr over 60 Minutes Intravenous Every 8 hours 12/02/16 0010 12/02/16 0632   12/02/16 0100  sulfamethoxazole-trimethoprim (BACTRIM,SEPTRA) 200-40 MG/5ML suspension 20 mL  Status:  Discontinued     20 mL Per Tube  Once per day on Mon Wed Fri 12/02/16 0010 12/06/16 1416   12/02/16 0000  ampicillin (OMNIPEN) 2 g in sodium chloride 0.9 % 50 mL IVPB  Status:  Discontinued     2 g 150 mL/hr over 20 Minutes Intravenous Every 4 hours 12/01/16 2322 12/04/16 0941      Medications: Scheduled Meds: . small volume/piggyback builder   Intravenous Q8H  . aspirin  325 mg Per Tube Daily  . chlorhexidine gluconate (MEDLINE KIT)  15 mL Mouth Rinse BID  . clonazePAM  2 mg Per Tube TID  . darunavir-cobicistat  1 tablet Oral Q breakfast  . dolutegravir  50 mg Oral Daily  . emtricitabine-tenofovir AF  1 tablet Per Tube Daily  . famotidine  20 mg Per Tube BID  . fentaNYL  50 mcg Transdermal Q72H  . furosemide  20 mg Intravenous Q12H  . heparin  5,000 Units Subcutaneous Q8H  . iopamidol  15 mL Oral Q1 Hr x 2  . lactulose  45 g Per Tube TID  . LORazepam  2 mg Intravenous Q6H  . mouth rinse  15 mL Mouth Rinse QID  . methylPREDNISolone (SOLU-MEDROL) injection  500 mg Intravenous Q12H  . polyethylene glycol  17 g Per Tube Daily  . sulfamethoxazole-trimethoprim  20 mL Per Tube Daily   Continuous Infusions: . feeding supplement (VITAL AF 1.2 CAL) Stopped (12/16/16 1205)  . insulin (NOVOLIN-R) infusion 7.9 Units/hr (12/16/16 1733)   PRN Meds:.sodium chloride, acetaminophen, chlorproMAZINE (THORAZINE) IV, docusate, fentaNYL (SUBLIMAZE) injection, haloperidol lactate, hydrALAZINE, ibuprofen, midazolam, sennosides, sodium chloride flush    Objective: Weight change: 8 lb 13.1 oz (4 kg)  Intake/Output Summary (Last 24 hours) at 12/16/16 1752 Last data filed at 12/16/16 1700  Gross per 24 hour  Intake          2278.74 ml  Output             1225 ml  Net          1053.74 ml   Blood pressure (!) 146/87, pulse 72, temperature 99 F (37.2 C), temperature source Oral, resp. rate (!) 37, height _0  (1.93 m), weight 212 lb 15.4 oz (96.6 kg), SpO2 97 %. Temp:  [97.8 F (36.6 C)-99 F (37.2 C)] 99 F (37.2 C) (02/08  1558) Pulse Rate:  [61-102] 72 (02/08 1700) Resp:  [12-38] 37 (02/08 1700) BP: (104-165)/(68-104) 146/87 (02/08 1700) SpO2:  [89 %-100 %] 97 % (02/08 1700) FiO2 (%):  [  30 %-60 %] 60 % (02/08 1540) Weight:  [212 lb 15.4 oz (96.6 kg)] 212 lb 15.4 oz (96.6 kg) (02/08 0500)  Physical Exam: General: uncomfortable appearing, young male HEENT: R cornea with whitish discoloration as compared to the L, no scleral icterus, trach collar in place Cardiac: RRR, no rubs, murmurs or gallops Pulm: clear to auscultation bilaterally in the anterior lung fields, no wheezes, rales, or rhonchi Abd: distended and firm, bowel sounds present Ext: bilateral LE covered in boots and SCDs Neuro: unable to verbalize or follow commands  BMET  Recent Labs  12/15/16 0439 12/16/16 0239  NA 138 138  K 3.5 4.0  CL 97* 102  CO2 30 22  GLUCOSE 219* 334*  BUN 28* 42*  CREATININE 0.79 0.93  CALCIUM 9.1 9.5     Liver Panel   Recent Labs  12/15/16 0439  ALBUMIN 1.9*       Sedimentation Rate No results for input(s): ESRSEDRATE in the last 72 hours. C-Reactive Protein No results for input(s): CRP in the last 72 hours.  Micro Results: Recent Results (from the past 720 hour(s))  MRSA PCR Screening     Status: Abnormal   Collection Time: 12/01/16 10:26 PM  Result Value Ref Range Status   MRSA by PCR POSITIVE (A) NEGATIVE Final    Comment:        The GeneXpert MRSA Assay (FDA approved for NASAL specimens only), is one component of a comprehensive MRSA colonization surveillance program. It is not intended to diagnose MRSA infection nor to guide or monitor treatment for MRSA infections. RESULT CALLED TO, READ BACK BY AND VERIFIED WITH: R DONAHUE,RN _0  12/02/16 MKELLY,MLT   Culture, respiratory (tracheal aspirate)     Status: None   Collection Time: 12/01/16 11:05 PM  Result Value Ref Range Status   Specimen Description TRACHEAL ASPIRATE  Final   Special Requests NONE  Final   Gram Stain    Final    FEW WBC PRESENT,BOTH PMN AND MONONUCLEAR NO ORGANISMS SEEN    Culture Consistent with normal respiratory flora.  Final   Report Status 12/04/2016 FINAL  Final  Culture, blood (routine x 2)     Status: None   Collection Time: 12/02/16 12:00 AM  Result Value Ref Range Status   Specimen Description BLOOD LEFT HAND  Final   Special Requests AEROBIC BOTTLE ONLY 10ML  Final   Culture NO GROWTH 5 DAYS  Final   Report Status 12/07/2016 FINAL  Final  Culture, blood (routine x 2)     Status: None   Collection Time: 12/02/16 12:04 AM  Result Value Ref Range Status   Specimen Description BLOOD RIGHT HAND  Final   Special Requests BOTTLES DRAWN AEROBIC AND ANAEROBIC 5ML  Final   Culture NO GROWTH 5 DAYS  Final   Report Status 12/07/2016 FINAL  Final  Acid Fast Smear (AFB)     Status: None   Collection Time: 12/02/16  9:00 AM  Result Value Ref Range Status   AFB Specimen Processing Concentration  Final   Acid Fast Smear Negative  Final    Comment: (NOTE) Performed At: [facility] [place], [place] [id number] Lindon Romp MD [id number]    Source (AFB) ACID FAST BACILLI  Corrected  CSF culture     Status: None   Collection Time: 12/02/16  2:24 PM  Result Value Ref Range Status   Specimen Description CSF  Final   Special Requests NONE  Final   Gram  Stain   Final    CYTOSPIN SMEAR WBC PRESENT,BOTH PMN AND MONONUCLEAR NO ORGANISMS SEEN    Culture NO GROWTH 3 DAYS  Final   Report Status 12/06/2016 FINAL  Final  Culture, fungus without smear     Status: None (Preliminary result)   Collection Time: 12/02/16  2:25 PM  Result Value Ref Range Status   Specimen Description CSF  Final   Special Requests NONE  Final   Culture   Final    NO FUNGUS ISOLATED AFTER 14 DAYS CONTINUING TO HOLD FOR 6 WEEKS PER PHYSICIAN REQUEST   Report Status PENDING  Incomplete  Culture, bal-quantitative     Status: Abnormal   Collection Time: 12/05/16  9:49 AM  Result  Value Ref Range Status   Specimen Description BRONCHIAL ALVEOLAR LAVAGE  Final   Special Requests NONE  Final   Gram Stain NO WBC SEEN NO ORGANISMS SEEN  Final   Culture (A)  Final    40,000 COLONIES/mL KLEBSIELLA PNEUMONIAE Confirmed Extended Spectrum Beta-Lactamase Producer (ESBL)    Report Status 12/07/2016 FINAL  Final   Organism ID, Bacteria KLEBSIELLA PNEUMONIAE (A)  Final      Susceptibility   Klebsiella pneumoniae - MIC*    AMPICILLIN >=32 RESISTANT Resistant     CEFAZOLIN >=64 RESISTANT Resistant     CEFEPIME 2 RESISTANT Resistant     CEFTAZIDIME 4 RESISTANT Resistant     CEFTRIAXONE >=64 RESISTANT Resistant     CIPROFLOXACIN 1 SENSITIVE Sensitive     GENTAMICIN >=16 RESISTANT Resistant     IMIPENEM <=0.25 SENSITIVE Sensitive     TRIMETH/SULFA >=320 RESISTANT Resistant     AMPICILLIN/SULBACTAM >=32 RESISTANT Resistant     PIP/TAZO 16 SENSITIVE Sensitive     Extended ESBL POSITIVE Resistant     * 40,000 COLONIES/mL KLEBSIELLA PNEUMONIAE  Pneumocystis smear by DFA     Status: None   Collection Time: 12/05/16  9:49 AM  Result Value Ref Range Status   Specimen Source-PJSRC BRONCHIAL ALVEOLAR LAVAGE  Final   Pneumocystis jiroveci Ag NEGATIVE  Final    Comment: Performed at [place] [facility]  Acid Fast Smear (AFB)     Status: None   Collection Time: 12/05/16  9:49 AM  Result Value Ref Range Status   AFB Specimen Processing Concentration  Final   Acid Fast Smear Negative  Final    Comment: (NOTE) Performed At: [facility] [address] [place], [place] [id number] Lindon Romp MD [id number]    Source (AFB) BRONCHIAL ALVEOLAR LAVAGE  Final  Fungus Culture With Stain     Status: None (Preliminary result)   Collection Time: 12/05/16  9:49 AM  Result Value Ref Range Status   Fungus Stain Final report  Final    Comment: (NOTE) Performed At: [facility] [place], [place] [id number] Lindon Romp MD ER:[id number]     Fungus (Mycology) Culture PENDING  Incomplete   Fungal Source BRONCHIAL ALVEOLAR LAVAGE  Final  Fungus Culture Result     Status: None   Collection Time: 12/05/16  9:49 AM  Result Value Ref Range Status   Result 1 Comment  Final    Comment: (NOTE) KOH/Calcofluor preparation:  no fungus observed. Performed At: [facility] [place], [place] [id number] Lindon Romp MD OV:[id number]   CSF culture     Status: None   Collection Time: 12/06/16  4:54 PM  Result Value Ref Range Status   Specimen Description  CSF  Final   Special Requests NONE  Final   Gram Stain   Final    WBC PRESENT,BOTH PMN AND MONONUCLEAR NO ORGANISMS SEEN CYTOSPIN    Culture NO GROWTH 3 DAYS  Final   Report Status 12/09/2016 FINAL  Final  Culture, blood (routine x 2)     Status: None   Collection Time: 12/08/16  9:37 AM  Result Value Ref Range Status   Specimen Description BLOOD RIGHT ANTECUBITAL  Final   Special Requests BOTTLES DRAWN AEROBIC AND ANAEROBIC 5CC  Final   Culture NO GROWTH 5 DAYS  Final   Report Status 12/13/2016 FINAL  Final  Culture, blood (routine x 2)     Status: None   Collection Time: 12/08/16  9:41 AM  Result Value Ref Range Status   Specimen Description BLOOD BLOOD RIGHT ARM  Final   Special Requests BOTTLES DRAWN AEROBIC AND ANAEROBIC 5CC  Final   Culture NO GROWTH 5 DAYS  Final   Report Status 12/13/2016 FINAL  Final    Studies/Results: Dg Chest Port 1 View  Result Date: 12/16/2016 CLINICAL DATA:  Acute respiratory failure EXAM: PORTABLE CHEST 1 VIEW COMPARISON:  12/15/2016 FINDINGS: Cardiac shadow is stable. Feeding catheter and tracheostomy tube are again seen. The left jugular central line is not well appreciated on this exam. Persistent left retrocardiac density is noted. No focal infiltrate is seen on the right. No pneumothorax is seen. IMPRESSION: Persistent left basilar infiltrate. Electronically Signed   By: Inez Catalina M.D.   On: 12/16/2016 07:54    Dg Chest Port 1 View  Result Date: 12/15/2016 CLINICAL DATA:  Pulmonary edema. EXAM: PORTABLE CHEST 1 VIEW COMPARISON:  12/13/2016 and 12/12/2016 FINDINGS: Tracheostomy tube is in place. Left jugular vein catheter is unchanged with the tip at the junction of the jugular vein and left innominate vein. Pulmonary vascular congestion has improved. The infiltrate and effusion at the left lung base process. New slight atelectasis at the right lung base. IMPRESSION: 1. A persistent infiltrate and effusion at the left lung base, stable. 2. New slight atelectasis at the right lung base. 3. Improved pulmonary vascular prominence. Electronically Signed   By: Lorriane Shire M.D.   On: 12/15/2016 07:34   Dg Abd Portable 1v  Result Date: 12/16/2016 CLINICAL DATA:  Assess nasogastric tube positioning. EXAM: PORTABLE ABDOMEN - 1 VIEW COMPARISON:  KUB of December 16, 2016 at 9:22 a.m. FINDINGS: The radiodense tipped feeding tube tip projects below the inferior margin of the image. The orogastric tube has its proximal port in the gastric cardia with the tip re-curved upon itself pointing toward the GE junction. There are loops of moderately distended gas-filled small and large bowel present. There is dense atelectasis or pneumonia in the left lower lobe. IMPRESSION: The tip of the feeding tube projects below the inferior margin of the image. Both the proximal port and the tip of the orogastric tube lie in the region of the gastric cardia but the tip of the tube is directed back toward the GE junction. Electronically Signed   By: David  Martinique M.D.   On: 12/16/2016 15:58   Dg Abd Portable 1v  Result Date: 12/16/2016 CLINICAL DATA:  Abdominal distention EXAM: PORTABLE ABDOMEN - 1 VIEW COMPARISON:  December 14, 2016 FINDINGS: Feeding tube tip is in the third portion of the duodenum, unchanged. There are multiple loops of dilated bowel without bowel wall thickening. No free air evident. IMPRESSION: Bowel gas pattern is  concerning for ileus or  possibly early bowel obstruction. No demonstrable free air. Feeding tube tip in third portion duodenum. Electronically Signed   By: Lowella Grip III M.D.   On: 12/16/2016 09:37      Assessment/Plan:  Active Problems:   COPD (chronic obstructive pulmonary disease) (HCC)   Substance abuse   Genital warts   HIV (human immunodeficiency virus infection) ([place])   AIDS (acquired immune deficiency syndrome) ([place])   Noncompliance   Bacterial meningitis   Hypertensive urgency   Septic shock (HCC)   Transaminitis   Cocaine abuse   Open wound   Acute respiratory failure with hypoxia (HCC)   Altered mental status   Acute encephalopathy   Cerebral embolism with cerebral infarction   Endotracheally intubated   Meningoencephalitis   Neurosyphilis   Herpesviral meningitis   Paraplegia (HCC)   Acute pulmonary edema (HCC)   Transverse myelitis (HCC)   Encounter for orogastric (OG) tube placement   Somnolence   Abdominal distention  Lawrence Kane is a 51 y.o. 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   HSV meningoencephalitis with transverse myelitis: CMV PCR resulted negative at [facility]. -Continue acyclovir [Day 13] and methylprednisolone 1044m [Day 2/5].  Abdominal distension: CT pending per primary team.   HIV: Continue dolutegravir, emtracitabine-tenofovir, darunavir/cobicistat, SMP/TMX [PCP prophylaxis].   LOS: 15 days   Lawrence Rakes2/06/2017, 5:52 PM

## 2016-12-17 ENCOUNTER — Inpatient Hospital Stay (HOSPITAL_COMMUNITY): Payer: Medicaid Other

## 2016-12-17 DIAGNOSIS — M6281 Muscle weakness (generalized): Secondary | ICD-10-CM

## 2016-12-17 DIAGNOSIS — B004 Herpesviral encephalitis: Secondary | ICD-10-CM

## 2016-12-17 LAB — GLUCOSE, CAPILLARY
GLUCOSE-CAPILLARY: 116 mg/dL — AB (ref 65–99)
GLUCOSE-CAPILLARY: 134 mg/dL — AB (ref 65–99)
GLUCOSE-CAPILLARY: 134 mg/dL — AB (ref 65–99)
GLUCOSE-CAPILLARY: 135 mg/dL — AB (ref 65–99)
GLUCOSE-CAPILLARY: 141 mg/dL — AB (ref 65–99)
GLUCOSE-CAPILLARY: 142 mg/dL — AB (ref 65–99)
GLUCOSE-CAPILLARY: 149 mg/dL — AB (ref 65–99)
GLUCOSE-CAPILLARY: 210 mg/dL — AB (ref 65–99)
Glucose-Capillary: 142 mg/dL — ABNORMAL HIGH (ref 65–99)
Glucose-Capillary: 144 mg/dL — ABNORMAL HIGH (ref 65–99)
Glucose-Capillary: 153 mg/dL — ABNORMAL HIGH (ref 65–99)
Glucose-Capillary: 150 mg/dL — ABNORMAL HIGH (ref 65–99)
Glucose-Capillary: 135 mg/dL — ABNORMAL HIGH (ref 65–99)
Glucose-Capillary: 151 mg/dL — ABNORMAL HIGH (ref 65–99)
Glucose-Capillary: 163 mg/dL — ABNORMAL HIGH (ref 65–99)
Glucose-Capillary: 171 mg/dL — ABNORMAL HIGH (ref 65–99)
Glucose-Capillary: 189 mg/dL — ABNORMAL HIGH (ref 65–99)
Glucose-Capillary: 192 mg/dL — ABNORMAL HIGH (ref 65–99)
Glucose-Capillary: 144 mg/dL — ABNORMAL HIGH (ref 65–99)
Glucose-Capillary: 172 mg/dL — ABNORMAL HIGH (ref 65–99)
Glucose-Capillary: 115 mg/dL — ABNORMAL HIGH (ref 65–99)
Glucose-Capillary: 141 mg/dL — ABNORMAL HIGH (ref 65–99)

## 2016-12-17 LAB — BASIC METABOLIC PANEL
Anion gap: 10 (ref 5–15)
BUN: 55 mg/dL — AB (ref 6–20)
CALCIUM: 9.4 mg/dL (ref 8.9–10.3)
CO2: 28 mmol/L (ref 22–32)
CREATININE: 1.08 mg/dL (ref 0.61–1.24)
Chloride: 107 mmol/L (ref 101–111)
GFR calc Af Amer: >60 mL/min (ref >60)
GLUCOSE: 140 mg/dL — AB (ref 65–99)
Potassium: 4 mmol/L (ref 3.5–5.1)
SODIUM: 145 mmol/L (ref 135–145)

## 2016-12-17 MED ORDER — METHYLPREDNISOLONE SODIUM SUCC 40 MG IJ SOLR
40.0000 mg | Freq: Three times a day (TID) | INTRAMUSCULAR | Status: DC
  Administered 2016-12-20 – 2016-12-22 (×5): 40 mg via INTRAVENOUS
  Filled 2016-12-17 (×6): qty 1

## 2016-12-17 MED ORDER — LACTULOSE 10 GM/15ML PO SOLN
45.0000 g | Freq: Every day | ORAL | Status: DC
  Administered 2016-12-18: 45 g
  Filled 2016-12-17 (×2): qty 90

## 2016-12-17 NOTE — Progress Notes (Signed)
Subjective: This morning, he was awake and following commands. He is unable to verbalize at this time.   Antibiotics:  Anti-infectives    Start     Dose/Rate Route Frequency Ordered Stop   12/14/16 1515  sulfamethoxazole-trimethoprim (BACTRIM,SEPTRA) 200-40 MG/5ML suspension 20 mL     20 mL Per Tube Daily 12/14/16 1508     12/13/16 1800  sulfamethoxazole-trimethoprim (BACTRIM DS,SEPTRA DS) 800-160 MG per tablet 1 tablet  Status:  Discontinued     1 tablet Oral Daily 12/13/16 1753 12/14/16 1508   12/12/16 1800  acyclovir (ZOVIRAX) 1,000 mg in sodium chloride 0.9 % 150 mL injection     150 mL/hr over 60 Minutes Intravenous Every 8 hours 12/12/16 1410     12/10/16 1400  meropenem (MERREM) 2 g in sodium chloride 0.9 % 100 mL IVPB  Status:  Discontinued     2 g 200 mL/hr over 30 Minutes Intravenous Every 8 hours 12/10/16 1140 12/14/16 1506   12/10/16 1300  vancomycin (VANCOCIN) 1,250 mg in sodium chloride 0.9 % 250 mL IVPB  Status:  Discontinued     1,250 mg 166.7 mL/hr over 90 Minutes Intravenous Every 8 hours 12/10/16 1140 12/13/16 0824   12/09/16 1000  penicillin G potassium 4 Million Units in dextrose 5 % 250 mL IVPB  Status:  Discontinued     4 Million Units 250 mL/hr over 60 Minutes Intravenous Every 4 hours 12/09/16 0851 12/10/16 1109   12/09/16 1000  ciprofloxacin (CIPRO) IVPB 400 mg  Status:  Discontinued     400 mg 200 mL/hr over 60 Minutes Intravenous Every 12 hours 12/09/16 0929 12/10/16 1109   12/07/16 2245  meropenem (MERREM) 2 g in sodium chloride 0.9 % 100 mL IVPB  Status:  Discontinued     2 g 200 mL/hr over 30 Minutes Intravenous Every 8 hours 12/07/16 2236 12/09/16 0851   12/04/16 2200  vancomycin (VANCOCIN) 1,250 mg in sodium chloride 0.9 % 250 mL IVPB  Status:  Discontinued     1,250 mg 166.7 mL/hr over 90 Minutes Intravenous Every 8 hours 12/04/16 1421 12/07/16 1057   12/04/16 1000  acyclovir (ZOVIRAX) 1,000 mg in dextrose 5 % 150 mL IVPB  Status:   Discontinued     1,000 mg 170 mL/hr over 60 Minutes Intravenous Every 8 hours 12/04/16 0943 12/12/16 1409   12/03/16 0900  acyclovir (ZOVIRAX) 1,080 mg in dextrose 5 % 250 mL IVPB  Status:  Discontinued     1,080 mg 271.6 mL/hr over 60 Minutes Intravenous Every 8 hours 12/03/16 0651 12/03/16 0843   12/03/16 0900  acyclovir (ZOVIRAX) 1,000 mg in dextrose 5 % 150 mL IVPB  Status:  Discontinued     1,000 mg 170 mL/hr over 60 Minutes Intravenous Every 8 hours 12/03/16 0843 12/03/16 1002   12/02/16 1015  darunavir-cobicistat (PREZCOBIX) 800-150 MG per tablet 1 tablet     1 tablet Oral Daily with breakfast 12/02/16 1003     12/02/16 1000  emtricitabine-tenofovir AF (DESCOVY) 200-25 MG per tablet 1 tablet     1 tablet Per Tube Daily 12/01/16 2320     12/02/16 1000  dolutegravir (TIVICAY) tablet 50 mg     50 mg Oral Daily 12/01/16 2320     12/02/16 1000  vancomycin (VANCOCIN) 1,250 mg in sodium chloride 0.9 % 250 mL IVPB  Status:  Discontinued     1,250 mg 166.7 mL/hr over 90 Minutes Intravenous Every 24 hours 12/02/16 0010 12/02/16 6962  12/02/16 0900  acyclovir (ZOVIRAX) 930 mg in dextrose 5 % 150 mL IVPB  Status:  Discontinued     10 mg/kg  93 kg 168.6 mL/hr over 60 Minutes Intravenous Every 8 hours 12/02/16 0632 12/03/16 0651   12/02/16 0700  vancomycin (VANCOCIN) IVPB 1000 mg/200 mL premix  Status:  Discontinued     1,000 mg 200 mL/hr over 60 Minutes Intravenous Every 8 hours 12/02/16 0634 12/04/16 1421   12/02/16 0400  cefTRIAXone (ROCEPHIN) 2 g in dextrose 5 % 50 mL IVPB  Status:  Discontinued     2 g 100 mL/hr over 30 Minutes Intravenous Every 12 hours 12/02/16 0010 12/07/16 2222   12/02/16 0100  acyclovir (ZOVIRAX) 1,110 mg in dextrose 5 % 250 mL IVPB  Status:  Discontinued     10 mg/kg  111.1 kg 272.2 mL/hr over 60 Minutes Intravenous Every 8 hours 12/02/16 0010 12/02/16 0632   12/02/16 0100  sulfamethoxazole-trimethoprim (BACTRIM,SEPTRA) 200-40 MG/5ML suspension 20 mL  Status:   Discontinued     20 mL Per Tube Once per day on Mon Wed Fri 12/02/16 0010 12/06/16 1416   12/02/16 0000  ampicillin (OMNIPEN) 2 g in sodium chloride 0.9 % 50 mL IVPB  Status:  Discontinued     2 g 150 mL/hr over 20 Minutes Intravenous Every 4 hours 12/01/16 2322 12/04/16 0941      Medications: Scheduled Meds: . small volume/piggyback builder   Intravenous Q8H  . aspirin  325 mg Per Tube Daily  . chlorhexidine gluconate (MEDLINE KIT)  15 mL Mouth Rinse BID  . clonazePAM  2 mg Per Tube TID  . darunavir-cobicistat  1 tablet Oral Q breakfast  . dolutegravir  50 mg Oral Daily  . emtricitabine-tenofovir AF  1 tablet Per Tube Daily  . famotidine  20 mg Per Tube BID  . fentaNYL  50 mcg Transdermal Q72H  . heparin  5,000 Units Subcutaneous Q8H  . [START ON 12/18/2016] lactulose  45 g Per Tube Daily  . LORazepam  2 mg Intravenous Q6H  . mouth rinse  15 mL Mouth Rinse QID  . [START ON 12/20/2016] methylPREDNISolone (SOLU-MEDROL) injection  40 mg Intravenous Q8H  . methylPREDNISolone (SOLU-MEDROL) injection  500 mg Intravenous Q12H  . polyethylene glycol  17 g Per Tube Daily  . sulfamethoxazole-trimethoprim  20 mL Per Tube Daily   Continuous Infusions: . feeding supplement (VITAL AF 1.2 CAL) Stopped (12/16/16 1205)  . insulin (NOVOLIN-R) infusion 1 Units/hr (12/17/16 1228)   PRN Meds:.sodium chloride, acetaminophen, chlorproMAZINE (THORAZINE) IV, docusate, fentaNYL (SUBLIMAZE) injection, haloperidol lactate, hydrALAZINE, ibuprofen, midazolam, sennosides, sodium chloride flush    Objective: Weight change: -2 lb 10.3 oz (-1.2 kg)  Intake/Output Summary (Last 24 hours) at 12/17/16 1239 Last data filed at 12/17/16 1200  Gross per 24 hour  Intake           620.49 ml  Output             2165 ml  Net         -1544.51 ml   Blood pressure (!) 155/112, pulse 61, temperature 97 F (36.1 C), temperature source Oral, resp. rate 19, height _0  (1.93 m), weight 210 lb 5.1 oz (95.4 kg), SpO2 96  %. Temp:  [97 F (36.1 C)-99 F (37.2 C)] 97 F (36.1 C) (02/09 1148) Pulse Rate:  [61-91] 61 (02/09 1200) Resp:  [15-37] 19 (02/09 1200) BP: (117-158)/(76-112) 155/112 (02/09 1220) SpO2:  [93 %-100 %] 96 % (02/09 1200) FiO2 (%):  [  30 %-60 %] 30 % (02/09 1050) Weight:  [210 lb 5.1 oz (95.4 kg)] 210 lb 5.1 oz (95.4 kg) (02/09 0500)  Physical Exam: General: young male, resting in bed HEENT: R cornea with whitish discoloration inferiorly as compared to the L, EOMI, no scleral icterus, trach collar in place Cardiac: RRR, no rubs, murmurs or gallops Pulm: clear to auscultation bilaterally in the anterior lung fields, no wheezes, rales, or rhonchi Abd: soft, nontender, distended, BS present Ext: bilateral LE covered in boots and SCDs Neuro:  moving both upper extremities freely though unable to move his lower extremities, follows commands   BMET  Recent Labs  12/16/16 0239 12/17/16 0900  NA 138 145  K 4.0 4.0  CL 102 107  CO2 22 28  GLUCOSE 334* 140*  BUN 42* 55*  CREATININE 0.93 1.08  CALCIUM 9.5 9.4     Liver Panel   Recent Labs  12/15/16 0439  ALBUMIN 1.9*       Sedimentation Rate No results for input(s): ESRSEDRATE in the last 72 hours. C-Reactive Protein No results for input(s): CRP in the last 72 hours.  Micro Results: Recent Results (from the past 720 hour(s))  MRSA PCR Screening     Status: Abnormal   Collection Time: 12/01/16 10:26 PM  Result Value Ref Range Status   MRSA by PCR POSITIVE (A) NEGATIVE Final    Comment:        The GeneXpert MRSA Assay (FDA approved for NASAL specimens only), is one component of a comprehensive MRSA colonization surveillance program. It is not intended to diagnose MRSA infection nor to guide or monitor treatment for MRSA infections. RESULT CALLED TO, READ BACK BY AND VERIFIED WITH: R DONAHUE,RN _0  12/02/16 MKELLY,MLT   Culture, respiratory (tracheal aspirate)     Status: None   Collection Time: 12/01/16  11:05 PM  Result Value Ref Range Status   Specimen Description TRACHEAL ASPIRATE  Final   Special Requests NONE  Final   Gram Stain   Final    FEW WBC PRESENT,BOTH PMN AND MONONUCLEAR NO ORGANISMS SEEN    Culture Consistent with normal respiratory flora.  Final   Report Status 12/04/2016 FINAL  Final  Culture, blood (routine x 2)     Status: None   Collection Time: 12/02/16 12:00 AM  Result Value Ref Range Status   Specimen Description BLOOD LEFT HAND  Final   Special Requests AEROBIC BOTTLE ONLY 10ML  Final   Culture NO GROWTH 5 DAYS  Final   Report Status 12/07/2016 FINAL  Final  Culture, blood (routine x 2)     Status: None   Collection Time: 12/02/16 12:04 AM  Result Value Ref Range Status   Specimen Description BLOOD RIGHT HAND  Final   Special Requests BOTTLES DRAWN AEROBIC AND ANAEROBIC 5ML  Final   Culture NO GROWTH 5 DAYS  Final   Report Status 12/07/2016 FINAL  Final  Acid Fast Smear (AFB)     Status: None   Collection Time: 12/02/16  9:00 AM  Result Value Ref Range Status   AFB Specimen Processing Concentration  Final   Acid Fast Smear Negative  Final    Comment: (NOTE) Performed At: [facility] [place], [place] [id number] Lindon Romp MD [id number]    Source (AFB) ACID FAST BACILLI  Corrected  CSF culture     Status: None   Collection Time: 12/02/16  2:24 PM  Result Value Ref Range Status   Specimen Description CSF  Final   Special Requests NONE  Final   Gram Stain   Final    CYTOSPIN SMEAR WBC PRESENT,BOTH PMN AND MONONUCLEAR NO ORGANISMS SEEN    Culture NO GROWTH 3 DAYS  Final   Report Status 12/06/2016 FINAL  Final  Culture, fungus without smear     Status: None (Preliminary result)   Collection Time: 12/02/16  2:25 PM  Result Value Ref Range Status   Specimen Description CSF  Final   Special Requests NONE  Final   Culture   Final    NO FUNGUS ISOLATED AFTER 14 DAYS CONTINUING TO HOLD FOR 6 WEEKS PER PHYSICIAN REQUEST     Report Status PENDING  Incomplete  Culture, bal-quantitative     Status: Abnormal   Collection Time: 12/05/16  9:49 AM  Result Value Ref Range Status   Specimen Description BRONCHIAL ALVEOLAR LAVAGE  Final   Special Requests NONE  Final   Gram Stain NO WBC SEEN NO ORGANISMS SEEN  Final   Culture (A)  Final    40,000 COLONIES/mL KLEBSIELLA PNEUMONIAE Confirmed Extended Spectrum Beta-Lactamase Producer (ESBL)    Report Status 12/07/2016 FINAL  Final   Organism ID, Bacteria KLEBSIELLA PNEUMONIAE (A)  Final      Susceptibility   Klebsiella pneumoniae - MIC*    AMPICILLIN >=32 RESISTANT Resistant     CEFAZOLIN >=64 RESISTANT Resistant     CEFEPIME 2 RESISTANT Resistant     CEFTAZIDIME 4 RESISTANT Resistant     CEFTRIAXONE >=64 RESISTANT Resistant     CIPROFLOXACIN 1 SENSITIVE Sensitive     GENTAMICIN >=16 RESISTANT Resistant     IMIPENEM <=0.25 SENSITIVE Sensitive     TRIMETH/SULFA >=320 RESISTANT Resistant     AMPICILLIN/SULBACTAM >=32 RESISTANT Resistant     PIP/TAZO 16 SENSITIVE Sensitive     Extended ESBL POSITIVE Resistant     * 40,000 COLONIES/mL KLEBSIELLA PNEUMONIAE  Pneumocystis smear by DFA     Status: None   Collection Time: 12/05/16  9:49 AM  Result Value Ref Range Status   Specimen Source-PJSRC BRONCHIAL ALVEOLAR LAVAGE  Final   Pneumocystis jiroveci Ag NEGATIVE  Final    Comment: Performed at [facility]  Acid Fast Smear (AFB)     Status: None   Collection Time: 12/05/16  9:49 AM  Result Value Ref Range Status   AFB Specimen Processing Concentration  Final   Acid Fast Smear Negative  Final    Comment: (NOTE) Performed At: [facility] [address] [place], [place] [id number] Lindon Romp MD ZD:[id number]    Source (AFB) BRONCHIAL ALVEOLAR LAVAGE  Final  Fungus Culture With Stain     Status: None (Preliminary result)   Collection Time: 12/05/16  9:49 AM  Result Value Ref Range Status   Fungus Stain Final report  Final     Comment: (NOTE) Performed At: [facility] [place], [place] [id number] Lindon Romp MD AY:[id number]    Fungus (Mycology) Culture PENDING  Incomplete   Fungal Source BRONCHIAL ALVEOLAR LAVAGE  Final  Fungus Culture Result     Status: None   Collection Time: 12/05/16  9:49 AM  Result Value Ref Range Status   Result 1 Comment  Final    Comment: (NOTE) KOH/Calcofluor preparation:  no fungus observed. Performed At: [facility] [address] [place], [place] [id number] Lindon Romp MD RK:[id number]   CSF culture     Status: None   Collection Time: 12/06/16  4:54 PM  Result Value Ref Range Status   Specimen Description CSF  Final   Special Requests NONE  Final   Gram Stain   Final    WBC PRESENT,BOTH PMN AND MONONUCLEAR NO ORGANISMS SEEN CYTOSPIN    Culture NO GROWTH 3 DAYS  Final   Report Status 12/09/2016 FINAL  Final  Culture, blood (routine x 2)     Status: None   Collection Time: 12/08/16  9:37 AM  Result Value Ref Range Status   Specimen Description BLOOD RIGHT ANTECUBITAL  Final   Special Requests BOTTLES DRAWN AEROBIC AND ANAEROBIC 5CC  Final   Culture NO GROWTH 5 DAYS  Final   Report Status 12/13/2016 FINAL  Final  Culture, blood (routine x 2)     Status: None   Collection Time: 12/08/16  9:41 AM  Result Value Ref Range Status   Specimen Description BLOOD BLOOD RIGHT ARM  Final   Special Requests BOTTLES DRAWN AEROBIC AND ANAEROBIC 5CC  Final   Culture NO GROWTH 5 DAYS  Final   Report Status 12/13/2016 FINAL  Final    Studies/Results: Ct Abdomen Pelvis Wo Contrast  Result Date: 12/17/2016 CLINICAL DATA:  Bowel obstruction EXAM: CT ABDOMEN AND PELVIS WITHOUT CONTRAST TECHNIQUE: Multidetector CT imaging of the abdomen and pelvis was performed following the standard protocol without IV contrast. COMPARISON:  12/16/2016, CT 11/29/2016 FINDINGS: Lower chest: Atelectasis or partial consolidation in the right lower lobe. Dense  partial left lower lobe consolidation concerning for a pneumonia. Normal heart size. Hepatobiliary: No focal hepatic abnormality. The gallbladder is enlarged up to 5.1 cm. No calcified stones. No wall thickening. No biliary dilatation. Pancreas: Unremarkable. No pancreatic ductal dilatation or surrounding inflammatory changes. Spleen: Normal in size without focal abnormality. Adrenals/Urinary Tract: Adrenal glands are unremarkable. Kidneys are normal, without renal calculi, focal lesion, or hydronephrosis. Bladder contains a Foley catheter Stomach/Bowel: Esophageal tube is present in the stomach and the tip terminates in the proximal duodenum. There are borderline to slightly dilated fluid-filled loops of small bowel throughout the abdomen. There is enlarged fluid filled right colon. No wall thickening. The appendix is not well identified. Vascular/Lymphatic: Non aneurysmal aorta. No grossly enlarged lymph nodes. Reproductive: No masses. Other: No free air.  No free fluid. Musculoskeletal: No acute or suspicious bone lesions IMPRESSION: 1. Borderline to mildly enlarged loops of small bowel throughout the abdomen with dilated fluid-filled right colon and transverse colon, findings are suggestive of an ileus. Partial bowel obstruction included in the differential but felt less likely. 2. Partial consolidation in the left lower lobe concerning for pneumonia. Electronically Signed   By: Donavan Foil M.D.   On: 12/17/2016 01:36   Dg Chest Port 1 View  Result Date: 12/16/2016 CLINICAL DATA:  Acute respiratory failure EXAM: PORTABLE CHEST 1 VIEW COMPARISON:  12/15/2016 FINDINGS: Cardiac shadow is stable. Feeding catheter and tracheostomy tube are again seen. The left jugular central line is not well appreciated on this exam. Persistent left retrocardiac density is noted. No focal infiltrate is seen on the right. No pneumothorax is seen. IMPRESSION: Persistent left basilar infiltrate. Electronically Signed   By: Inez Catalina M.D.   On: 12/16/2016 07:54   Dg Abd Portable 1v  Result Date: 12/17/2016 CLINICAL DATA:  Followup abdominal distension. EXAM: PORTABLE ABDOMEN - 1 VIEW FINDINGS: There is an NG tube with its tip in the antropyloric region of the stomach. There is also a feeding tube with its tip in the fourth portion of the duodenum.  The lung bases are grossly clear. Persistent air distended small bowel and colon suggesting a diffuse ileus. No free air. IMPRESSION: Persistent diffuse ileus bowel gas pattern. NG and feeding tubes in good position. Electronically Signed   By: Marijo Sanes M.D.   On: 12/17/2016 09:39   Dg Abd Portable 1v  Result Date: 12/16/2016 CLINICAL DATA:  Assess nasogastric tube positioning. EXAM: PORTABLE ABDOMEN - 1 VIEW COMPARISON:  KUB of December 16, 2016 at 9:22 a.m. FINDINGS: The radiodense tipped feeding tube tip projects below the inferior margin of the image. The orogastric tube has its proximal port in the gastric cardia with the tip re-curved upon itself pointing toward the GE junction. There are loops of moderately distended gas-filled small and large bowel present. There is dense atelectasis or pneumonia in the left lower lobe. IMPRESSION: The tip of the feeding tube projects below the inferior margin of the image. Both the proximal port and the tip of the orogastric tube lie in the region of the gastric cardia but the tip of the tube is directed back toward the GE junction. Electronically Signed   By: David  Martinique M.D.   On: 12/16/2016 15:58   Dg Abd Portable 1v  Result Date: 12/16/2016 CLINICAL DATA:  Abdominal distention EXAM: PORTABLE ABDOMEN - 1 VIEW COMPARISON:  December 14, 2016 FINDINGS: Feeding tube tip is in the third portion of the duodenum, unchanged. There are multiple loops of dilated bowel without bowel wall thickening. No free air evident. IMPRESSION: Bowel gas pattern is concerning for ileus or possibly early bowel obstruction. No demonstrable free air. Feeding tube  tip in third portion duodenum. Electronically Signed   By: Lowella Grip III M.D.   On: 12/16/2016 09:37      Assessment/Plan:  Active Problems:   COPD (chronic obstructive pulmonary disease) (HCC)   Substance abuse   Genital warts   HIV (human immunodeficiency virus infection) ([place])   AIDS (acquired immune deficiency syndrome) ([place])   Noncompliance   Bacterial meningitis   Hypertensive urgency   Septic shock (HCC)   Transaminitis   Cocaine abuse   Open wound   Acute respiratory failure with hypoxia (HCC)   Altered mental status   Acute encephalopathy   Cerebral embolism with cerebral infarction   Endotracheally intubated   Meningoencephalitis   Neurosyphilis   Herpesviral meningitis   Paraplegia ([place])   Acute pulmonary edema (HCC)   Transverse myelitis (HCC)   Encounter for orogastric (OG) tube placement   Somnolence   Abdominal distention   Bowel obstruction  Lawrence Kane is a 51 y.o. 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   HSV meningoencephalitis with transverse myelitis: Continue acyclovir [Day 14] and methylprednisolone 1026m [Day 3/5].  HIV: Continue dolutegravir, emtracitabine-tenofovir, darunavir/cobicistat, SMP/TMX [PCP prophylaxis].   LOS: 16 days   PCharlott Rakes2/07/2017, 12:39 PM

## 2016-12-17 NOTE — Progress Notes (Addendum)
PULMONARY / CRITICAL CARE MEDICINE   Name: Lawrence Kane MRN: [id number] DOB: 01-16-1966    ADMISSION DATE:  12/01/2016 CONSULTATION DATE:  12/01/2016  REFERRING MD:  [name] transfer  CHIEF COMPLAINT:  AMS  BRIEF SUMMARY:   51 year old man with HIV/AIDS (first diagnosed 2003, last CD4 count 245 with viremia of 1,160 in October 2017; on Genovya, Darunavir and Bactrim), Genital Warts, COPD, and cocaine substance abuse who presented to [facility] on 1/24 with complaint of altered mental status. Per notes, patient may have recently had some type of surgery in [place] where his mother resides. Afebrile, tachycardic to 155, RR 18, hypertensive to 240/112. Patient was intubated for airway protection, CVL in IJ was placed, and LP was performed given his altered mental status. He was started on Versed and propofol for sedation. Initial labs showed UA negative for infection. UDS positive for cocaine. CSF appeared yellow, cloudy, xanthochromic, WBC 4845, RBC 625, glucose <20, total protein >300, Neutrophils 16%, Eos 64%, monocytes 17%, lymphocytes 3%. Gram stain performed, but unable to view results. ID was consulted. Dr. Baxter Flattery recommended continuing Truvada and Tivicay through NGT.  Prolonged respiratory failure s/p trach (2/5).  Weaned to ATC 2/6.    SUBJECTIVE:    RN reports pt is doing well. He is not in restraints right now, but she is concerned increased agitation will require him to be restrained again. His abd is less distended. He had a large BM today which was soft.  No other acute events overnight   VITAL SIGNS: BP (!) 143/105   Pulse 65   Temp 98.2 F (36.8 C) (Oral)   Resp (!) 23   Ht _0  (1.93 m)   Wt 210 lb 5.1 oz (95.4 kg)   SpO2 98%   BMI 25.60 kg/m   VENTILATOR SETTINGS: Vent Mode: PRVC FiO2 (%):  [40 %-60 %] 50 % Set Rate:  [16 bmp] 16 bmp Vt Set:  [620 mL] 620 mL PEEP:  [5 cmH20] 5 cmH20 Pressure Support:  [5 cmH20] 5 cmH20 Plateau Pressure:  [17 cmH20-24  cmH20] 19 cmH20  INTAKE / OUTPUT: I/O last 3 completed shifts: In: 2596.1 [P.O.:3; I.V.:770.8; NG/GT:1606.3; IV Piggyback:216] Out: 2815 [Urine:2590; Emesis/NG output:225]  PHYSICAL EXAMINATION: General:  Chronically ill M in NAD HEENT: MM pink/moist, trach midline on ATC Neuro: Much improved mental status. Awake, alert, and responds approprietly. Slight agitation. generalized weakness CV: rrr with normal s1s2. No m/r/g PULM: even/non labored on ATC. Decreased lung sounds at bases L>R GI: hypoactive BS. Distended, but less so than yesterday, soft, nontender. Extremities: no edema  Skin: warm dry intact. No rashes   LABS:  BMET  Recent Labs Lab 12/13/16 0219 12/15/16 0439 12/16/16 0239  NA 136 138 138  K 4.1 3.5 4.0  CL 97* 97* 102  CO2 35* 30 22  BUN 24* 28* 42*  CREATININE 0.82 0.79 0.93  GLUCOSE 223* 219* 334*    Electrolytes  Recent Labs Lab 12/11/16 0330 12/12/16 0319 12/13/16 0219 12/15/16 0439 12/16/16 0239  CALCIUM 8.2* 8.1* 8.4* 9.1 9.5  MG 1.9 1.7 2.0  --   --   PHOS 3.6 3.2 2.6 3.8  --     CBC  Recent Labs Lab 12/13/16 0219 12/15/16 0430 12/16/16 0239  WBC 10.5 11.9* 8.8  HGB 8.4* 9.3* 9.7*  HCT 26.7* 29.6* 31.7*  PLT 321 396 443*    Coag's  Recent Labs Lab 12/13/16 1433  APTT 35  INR 0.99    Sepsis Markers No  results for input(s): LATICACIDVEN, PROCALCITON, O2SATVEN in the last 168 hours.  ABG No results for input(s): PHART, PCO2ART, PO2ART in the last 168 hours.  Liver Enzymes  Recent Labs Lab 12/11/16 0330 12/12/16 0319 12/13/16 0219 12/15/16 0439  AST 322* 197* 126*  --   ALT 391* 350* 248*  --   ALKPHOS 94 101 93  --   BILITOT 0.9 0.9 0.7  --   ALBUMIN 1.6* 1.8* 1.7* 1.9*    Cardiac Enzymes No results for input(s): TROPONINI, PROBNP in the last 168 hours.  Glucose  Recent Labs Lab 12/17/16 0226 12/17/16 0329 12/17/16 0426 12/17/16 0535 12/17/16 0643 12/17/16 0813  GLUCAP 142* 135* 149* 144* 153*  134*    Imaging Ct Abdomen Pelvis Wo Contrast  Result Date: 12/17/2016 CLINICAL DATA:  Bowel obstruction EXAM: CT ABDOMEN AND PELVIS WITHOUT CONTRAST TECHNIQUE: Multidetector CT imaging of the abdomen and pelvis was performed following the standard protocol without IV contrast. COMPARISON:  12/16/2016, CT 11/29/2016 FINDINGS: Lower chest: Atelectasis or partial consolidation in the right lower lobe. Dense partial left lower lobe consolidation concerning for a pneumonia. Normal heart size. Hepatobiliary: No focal hepatic abnormality. The gallbladder is enlarged up to 5.1 cm. No calcified stones. No wall thickening. No biliary dilatation. Pancreas: Unremarkable. No pancreatic ductal dilatation or surrounding inflammatory changes. Spleen: Normal in size without focal abnormality. Adrenals/Urinary Tract: Adrenal glands are unremarkable. Kidneys are normal, without renal calculi, focal lesion, or hydronephrosis. Bladder contains a Foley catheter Stomach/Bowel: Esophageal tube is present in the stomach and the tip terminates in the proximal duodenum. There are borderline to slightly dilated fluid-filled loops of small bowel throughout the abdomen. There is enlarged fluid filled right colon. No wall thickening. The appendix is not well identified. Vascular/Lymphatic: Non aneurysmal aorta. No grossly enlarged lymph nodes. Reproductive: No masses. Other: No free air.  No free fluid. Musculoskeletal: No acute or suspicious bone lesions IMPRESSION: 1. Borderline to mildly enlarged loops of small bowel throughout the abdomen with dilated fluid-filled right colon and transverse colon, findings are suggestive of an ileus. Partial bowel obstruction included in the differential but felt less likely. 2. Partial consolidation in the left lower lobe concerning for pneumonia. Electronically Signed   By: Donavan Foil M.D.   On: 12/17/2016 01:36   Dg Abd Portable 1v  Result Date: 12/16/2016 CLINICAL DATA:  Assess nasogastric  tube positioning. EXAM: PORTABLE ABDOMEN - 1 VIEW COMPARISON:  KUB of December 16, 2016 at 9:22 a.m. FINDINGS: The radiodense tipped feeding tube tip projects below the inferior margin of the image. The orogastric tube has its proximal port in the gastric cardia with the tip re-curved upon itself pointing toward the GE junction. There are loops of moderately distended gas-filled small and large bowel present. There is dense atelectasis or pneumonia in the left lower lobe. IMPRESSION: The tip of the feeding tube projects below the inferior margin of the image. Both the proximal port and the tip of the orogastric tube lie in the region of the gastric cardia but the tip of the tube is directed back toward the GE junction. Electronically Signed   By: David  Martinique M.D.   On: 12/16/2016 15:58   Dg Abd Portable 1v  Result Date: 12/16/2016 CLINICAL DATA:  Abdominal distention EXAM: PORTABLE ABDOMEN - 1 VIEW COMPARISON:  December 14, 2016 FINDINGS: Feeding tube tip is in the third portion of the duodenum, unchanged. There are multiple loops of dilated bowel without bowel wall thickening. No free air evident. IMPRESSION:  Bowel gas pattern is concerning for ileus or possibly early bowel obstruction. No demonstrable free air. Feeding tube tip in third portion duodenum. Electronically Signed   By: Lowella Grip III M.D.   On: 12/16/2016 09:37     STUDIES:  1/24 CXR >> ATX vs LLL infiltrate 1/25 CT head >> neg for acute intracranial process 1/25 LP cytology >> no malignant cells 1/25 LP >> yellow, cloudy, glucose 20, total protein >600, RBC 114>49, WBC 970, 36% neutrophil, 20% lymphs, 19% eos 1/28 BAL cell count >> 266 WBC, eos 1 1/28 BAL cytology >> no malignant cells 1/27 BAL for wet mount> neg for parasites 1/27 MRI brain >> small acute infarcts in cerebellum and cerebral cortex, sucal signal 1/29 LP >> Glucose 82, protein >600, WBC 600>885, eosinophils 28% 1/29 Echo >> LV EF 60-65%, indeterminent diastolic  function, wall motion normal 1/30 EEG >> diffuse slowing and background suppression, no seizures 2/3 MRI thoracic/lumbar/cervical >> (-) abscess. Meningitis. R/O CMV 2/8 abd/pevlis CT >> borderline to mildly enlarged loops of small bowel throughout the abd with dilated fluid filled R colon and transverses colon, findings are suggestive of an ileus. Partial bowel obstruction included in the differential, but felt less likely. Partial consolidation in the LLL concerning for PNA.    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 1/24 Cryptococcal [name]): negative 1/24 CSF [facility]): NGTD 1/22 scrotal wound cx [name]): MRSA Toxoplasma [name]): Negative 1/25 BCx x2 : NGTD 1/25 HCV ab + 1/24 Trach asp 1/24: NGTD 1/24 MRSA PCR: MRSA pos 1/25 HIV quant: 80, CD4 10 1/25 CSF AFB: Smear neg, >> 1/25 CSF fungal cx: NGTD 1/25 CSF crypto ag: neg 1/25 CSF gram stain no organisms, cx: NGTD 1/27 strongyloides ab >> negative 1/27 coccidiomycosis ab >> 1/28 BAL AFB smear/cx> 1/28 BAL Fungus cx >> 1/28 BAL gram stain/Cx: 40K ESBL Klebsiella pneumoniae 1/28 BAL pneumocystis smear: Neg 1/27 O&P >> 1/29 CSF Cx >> no organisms on gram stain, >> NGTD 2/04 CSF CMV >> not done?   ANTIBIOTICS: Rocephin 1/24>>1/30 Vancomycin 1/24>>1/30; 2/2 > 2/05 Acyclovir 1/24>> 1/26, 1/27>> Ampicillin 1/24>> 1/27 Bactrim M/W/F for PCP prophylaxis Meropenem 1/30>> 2/1; 2/2 >  Pen G 2/1 >> 2/2 Cipro 2/1 >> 2/2  SIGNIFICANT EVENTS: 1/24  Transferred from [facility] 1/25  Underwent LP here 2/05  Trach 2/06  To ATC 2/08 abd distention, likely ileus  LINES/TUBES: L IJ CVL 1/24 >> ETT 1/24 >> 2/5 Foley 1/24 >> OGT 1/24 >> 2/5 Trach (df) 2/5 >> NGT to suction 2/08  DISCUSSION: 51 year old man with HIV/AIDS (first diagnosed 2003, last CD4 count 245 with viremia of 1,160 in October 2017; on Genovya, Darunavir and Bactrim), Genital Warts, COPD, and cocaine substance  abuse who presented to [facility] on 1/24 with altered mental status. Intubated for airway protection. Prolonged vent wean due to ESBL klebsiella HCAP with sepsis > trach 2/5.    ASSESSMENT / PLAN:  PULMONARY A: Acute Hypoxemic Respiratory Failure 2/2 HCAP ESBL Klebsiella LLL+ sepsis + Unable to protect airway + pulm edema Tracheostomy Status - 2/5 P:   ATC as tolerated PRN vent support, PRVC 8 cc, R 16, PEEP 5 Wean O2 for sats >90% Pulmonary hygiene intermittent cxr Mobilize as able With abdo slight better = PS attempts 10  CARDIOVASCULAR A:  Sinus Tachycardia - resolved Incomplete RBBB HTN Pulm edema P:  Tx to SDU PRN hydralazine Lasix 20 mg q12- dc in setting ileus ASA daily  RENAL A:  At Risk AKI - in setting of critical illness, diuresis P:   Trend BMP / UOP Replace electrolytes as indicated Dc lasix  GASTROINTESTINAL A:   Moderate to Severe Protein Calorie Malnutrition  Transaminitis, (worsened by seroquel likely which was discontinued on 1/31).  Improved.  HCV ab+, Chronic hep b Abdominal Distention - bowel ileus vs partial SBO Loose stools = ileus , less likely obstruction withoutput P:   NPO Keep NGT to suction Pepcid BID Reduce lactulose and colace If continues, consider c diff Hold off reglan  HEMATOLOGIC A:   Mild leukocytosis > Resolved Mild anemia P:  Sub q heparin for DVT prophylaxis   INFECTIOUS A:   Meningitis, bacterial + viral. 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 P:   Minimize sedation as able Continue klonopin 2 mg tid Continue acyclovir fentanyul patch q 72 hr  PRN versed for agitation  Steroids to stop day 5 then tapper then slowly Bactrim on board for pcp prevention  To [facility]ad still as primary, will revisit Monday, he is better, over weekend for PS weaning and to trach collar if successful PS  Lavon Paganini. Titus Mould, MD, [place] Pgr: [place] 12/17/2016 10:53 AM

## 2016-12-17 NOTE — Progress Notes (Signed)
Paged [facility] regarding pt insulin gtt. Pt has had 6 CBG's <180 and gtt rate is below 4. Pt tube feeds are not at goal rate as they are turned off at this time due to potential bowel obstruction.   Per Pola CornELINK continue insulin gtt at this time and do not transition to Phase 3.  Will continue to monitor pt.  Roselie AwkwardMegan Leigh Kaeding, RN

## 2016-12-17 NOTE — Progress Notes (Signed)
[facility] PROGRESS NOTE  Lawrence Kane QJF:[id number] DOB: 1966-07-19 DOA: 12/01/2016  PCP: No primary care provider on file.  Brief History/Interval Summary: 51 year old man with HIV/AIDS (first diagnosed 2003, last CD4 count 245 with viremia of 1,160 in October 2017; on Genovya, Darunavir and Bactrim), Genital Warts, COPD, and cocaine substance abuse who presented to [facility] on 1/24 with complaint of altered mental status. Per notes, patient may have recently had some type of surgery in [place] where his mother resides. Afebrile, tachycardic to 155, RR 18, hypertensive to 240/112. Patient was intubated for airway protection, CVL in IJ was placed, and LP was performed given his altered mental status. ID was consulted. Patient was placed on antibiotics. He subsequently had to undergo tracheostomy.   Reason for Visit: Meningitis. Transverse myelitis  Consultants: Infectious disease. Critical care medicine. Neurology.  STUDIES:  1/24 CXR >> ATX vs LLL infiltrate 1/25 CT head >> neg for acute intracranial process 1/25 LP cytology >> no malignant cells 1/25 LP >> yellow, cloudy, glucose 20, total protein >600, RBC 114>49, WBC 970, 36% neutrophil, 20% lymphs, 19% eos 1/28 BAL cell count >> 266 WBC, eos 1 1/28 BAL cytology >> no malignant cells 1/27 BAL for wet mount> neg for parasites 1/27 MRI brain >> small acute infarcts in cerebellum and cerebral cortex, sucal signal 1/29 LP >> Glucose 82, protein >600, WBC 600>885, eosinophils 28% 1/29 Echo >> LV EF 60-65%, indeterminent diastolic function, wall motion normal 1/30 EEG >> diffuse slowing and background suppression, no seizures 2/3 MRI thoracic/lumbar/cervical >> (-) abscess. Meningitis. R/O CMV   CULTURES: 1/24 BCx x 2 Lawrence Kane): NGTD 1/24 HSV PCR Lawrence Kane): HSV2 POSITIVE 1/24 Cryptococcal Lawrence Kane): negative 1/24 CSF [facility]): NGTD 1/22 scrotal wound cx Lawrence Kane): MRSA Toxoplasma Lawrence Kane):  Negative 1/25BCx x2 : NGTD 1/25 HCV ab + 1/24 Trach asp 1/24: NGTD 1/24MRSA PCR: MRSA pos 1/25 HIV quant: 80, CD4 10 1/25 CSF AFB: Smear neg, >> 1/25 CSF fungal cx: NGTD 1/25 CSF crypto ag: neg 1/25 CSF gram stain no organisms, cx: NGTD 1/27 strongyloides ab >> negative 1/27 coccidiomycosis ab >> 1/28 BAL AFB smear/cx> 1/28 BAL Fungus cx >> 1/28 BAL gram stain/Cx: 40K ESBL Klebsiella pneumoniae 1/28 BAL pneumocystis smear: Neg 1/27 O&P >> 1/29 CSF Cx >> no organisms on gram stain, >> NGTD 2/04 CSF CMV >> not done?   ANTIBIOTICS: Rocephin 1/24>>1/30 Vancomycin 1/24>>1/30; 2/2 >  Acyclovir 1/24>> 1/26, 1/27>> Ampicillin 1/24>> 1/27 Bactrim M/W/F for PCP prophylaxis Meropenem 1/30>> 2/1; 2/2 >  Pen G 2/1 >> 2/2 Cipro 2/1 >> 2/2  SIGNIFICANT EVENTS: 1/24  Transferred from [facility] 1/25  Underwent LP here 2/05  Trach 2/06  To [facility]  LINES/TUBES: L IJ CVL 1/24 >> ETT 1/24 >> 2/5 Foley 1/24 >> OGT 1/24 >> 2/5 Trach (df) 2/5 >>   Subjective/Interval History: Patient is trying to communicate. However, due to his tracheostomy he is unable to do so. He did try writing down but cannot hold a pen.  ROS: Unable to do.  Objective:  Vital Signs  Vitals:   12/17/16 0843 12/17/16 0855 12/17/16 0900 12/17/16 1000  BP:  (!) 141/92 (!) 145/93 (!) 157/108  Pulse:  68 65 61  Resp:  _0 Temp: 97.3 F (36.3 C)     TempSrc: Oral     SpO2:  96% 95% 98%  Weight:      Height:        Intake/Output Summary (Last 24 hours) at 12/17/16 1127 Last data  filed at 12/17/16 1032  Gross per 24 hour  Intake           797.79 ml  Output             2115 ml  Net         -1317.21 ml   Filed Weights   12/15/16 0331 12/16/16 0500 12/17/16 0500  Weight: 92.6 kg (204 lb 2.3 oz) 96.6 kg (212 lb 15.4 oz) 95.4 kg (210 lb 5.1 oz)    General appearance: Patient is Awake and alert. Does not appear to be in any distress. Neck: Tracheostomy noted Resp: Coarse breath sounds  bilaterally. No wheezing, rales or rhonchi. Diminished at the bases. Cardio: regular rate and rhythm, S1, S2 normal, no murmur, click, rub or gallop GI: Abdomen is less distended today. Improved compared to yesterday. Soft to palpation today compared to yesterday. Nontender. Bowel sounds present. No masses or organomegaly. Extremities: Minimal edema noted bilateral lower extremities Pulses: 2+ and symmetric Neurologic: Patient is awake and alert. Moving all his extremities..  Lab Results:  Data Reviewed: I have personally reviewed following labs and imaging studies  CBC:  Recent Labs Lab 12/11/16 0330 12/12/16 0319 12/13/16 0219 12/15/16 0430 12/16/16 0239  WBC 9.6 12.3* 10.5 11.9* 8.8  HGB 8.5* 9.0* 8.4* 9.3* 9.7*  HCT 27.2* 29.0* 26.7* 29.6* 31.7*  MCV 97.5 97.0 97.8 97.4 96.9  PLT 286 356 321 396 443*    Basic Metabolic Panel:  Recent Labs Lab 12/11/16 0330 12/12/16 0319 12/13/16 0219 12/15/16 0439 12/16/16 0239 12/17/16 0900  NA 133* 133* 136 138 138 145  K 4.5 5.2* 4.1 3.5 4.0 4.0  CL 93* 90* 97* 97* 102 107  CO2 33* 34* 35* _0 GLUCOSE 211* 245* 223* 219* 334* 140*  BUN 25* 23* 24* 28* 42* 55*  CREATININE 0.97 0.83 0.82 0.79 0.93 1.08  CALCIUM 8.2* 8.1* 8.4* 9.1 9.5 9.4  MG 1.9 1.7 2.0  --   --   --   PHOS 3.6 3.2 2.6 3.8  --   --     GFR: Estimated Creatinine Clearance: 99.3 mL/min (by C-G formula based on SCr of 1.08 mg/dL).  Liver Function Tests:  Recent Labs Lab 12/11/16 0330 12/12/16 0319 12/13/16 0219 12/15/16 0439  AST 322* 197* 126*  --   ALT 391* 350* 248*  --   ALKPHOS 94 101 93  --   BILITOT 0.9 0.9 0.7  --   PROT 7.4 8.4* 7.6  --   ALBUMIN 1.6* 1.8* 1.7* 1.9*     Coagulation Profile:  Recent Labs Lab 12/13/16 1433  INR 0.99    CBG:  Recent Labs Lab 12/17/16 0535 12/17/16 0643 12/17/16 0813 12/17/16 0920 12/17/16 1025  GLUCAP 144* 153* 134* 150* 135*     Recent Results (from the past 240 hour(s))   Culture, blood (routine x 2)     Status: None   Collection Time: 12/08/16  9:37 AM  Result Value Ref Range Status   Specimen Description BLOOD RIGHT ANTECUBITAL  Final   Special Requests BOTTLES DRAWN AEROBIC AND ANAEROBIC 5CC  Final   Culture NO GROWTH 5 DAYS  Final   Report Status 12/13/2016 FINAL  Final  Culture, blood (routine x 2)     Status: None   Collection Time: 12/08/16  9:41 AM  Result Value Ref Range Status   Specimen Description BLOOD BLOOD RIGHT ARM  Final   Special Requests BOTTLES DRAWN AEROBIC AND ANAEROBIC 5CC  Final   Culture NO GROWTH 5 DAYS  Final   Report Status 12/13/2016 FINAL  Final      Radiology Studies: Ct Abdomen Pelvis Wo Contrast  Result Date: 12/17/2016 CLINICAL DATA:  Bowel obstruction EXAM: CT ABDOMEN AND PELVIS WITHOUT CONTRAST TECHNIQUE: Multidetector CT imaging of the abdomen and pelvis was performed following the standard protocol without IV contrast. COMPARISON:  12/16/2016, CT 11/29/2016 FINDINGS: Lower chest: Atelectasis or partial consolidation in the right lower lobe. Dense partial left lower lobe consolidation concerning for a pneumonia. Normal heart size. Hepatobiliary: No focal hepatic abnormality. The gallbladder is enlarged up to 5.1 cm. No calcified stones. No wall thickening. No biliary dilatation. Pancreas: Unremarkable. No pancreatic ductal dilatation or surrounding inflammatory changes. Spleen: Normal in size without focal abnormality. Adrenals/Urinary Tract: Adrenal glands are unremarkable. Kidneys are normal, without renal calculi, focal lesion, or hydronephrosis. Bladder contains a Foley catheter Stomach/Bowel: Esophageal tube is present in the stomach and the tip terminates in the proximal duodenum. There are borderline to slightly dilated fluid-filled loops of small bowel throughout the abdomen. There is enlarged fluid filled right colon. No wall thickening. The appendix is not well identified. Vascular/Lymphatic: Non aneurysmal aorta. No  grossly enlarged lymph nodes. Reproductive: No masses. Other: No free air.  No free fluid. Musculoskeletal: No acute or suspicious bone lesions IMPRESSION: 1. Borderline to mildly enlarged loops of small bowel throughout the abdomen with dilated fluid-filled right colon and transverse colon, findings are suggestive of an ileus. Partial bowel obstruction included in the differential but felt less likely. 2. Partial consolidation in the left lower lobe concerning for pneumonia. Electronically Signed   By: Donavan Foil M.D.   On: 12/17/2016 01:36   Dg Chest Port 1 View  Result Date: 12/16/2016 CLINICAL DATA:  Acute respiratory failure EXAM: PORTABLE CHEST 1 VIEW COMPARISON:  12/15/2016 FINDINGS: Cardiac shadow is stable. Feeding catheter and tracheostomy tube are again seen. The left jugular central line is not well appreciated on this exam. Persistent left retrocardiac density is noted. No focal infiltrate is seen on the right. No pneumothorax is seen. IMPRESSION: Persistent left basilar infiltrate. Electronically Signed   By: Inez Catalina M.D.   On: 12/16/2016 07:54   Dg Abd Portable 1v  Result Date: 12/17/2016 CLINICAL DATA:  Followup abdominal distension. EXAM: PORTABLE ABDOMEN - 1 VIEW FINDINGS: There is an NG tube with its tip in the antropyloric region of the stomach. There is also a feeding tube with its tip in the fourth portion of the duodenum. The lung bases are grossly clear. Persistent air distended small bowel and colon suggesting a diffuse ileus. No free air. IMPRESSION: Persistent diffuse ileus bowel gas pattern. NG and feeding tubes in good position. Electronically Signed   By: Marijo Sanes M.D.   On: 12/17/2016 09:39   Dg Abd Portable 1v  Result Date: 12/16/2016 CLINICAL DATA:  Assess nasogastric tube positioning. EXAM: PORTABLE ABDOMEN - 1 VIEW COMPARISON:  KUB of December 16, 2016 at 9:22 a.m. FINDINGS: The radiodense tipped feeding tube tip projects below the inferior margin of the image.  The orogastric tube has its proximal port in the gastric cardia with the tip re-curved upon itself pointing toward the GE junction. There are loops of moderately distended gas-filled small and large bowel present. There is dense atelectasis or pneumonia in the left lower lobe. IMPRESSION: The tip of the feeding tube projects below the inferior margin of the image. Both the proximal port and the tip of the orogastric tube  lie in the region of the gastric cardia but the tip of the tube is directed back toward the GE junction. Electronically Signed   By: David  Martinique M.D.   On: 12/16/2016 15:58   Dg Abd Portable 1v  Result Date: 12/16/2016 CLINICAL DATA:  Abdominal distention EXAM: PORTABLE ABDOMEN - 1 VIEW COMPARISON:  December 14, 2016 FINDINGS: Feeding tube tip is in the third portion of the duodenum, unchanged. There are multiple loops of dilated bowel without bowel wall thickening. No free air evident. IMPRESSION: Bowel gas pattern is concerning for ileus or possibly early bowel obstruction. No demonstrable free air. Feeding tube tip in third portion duodenum. Electronically Signed   By: Lowella Grip III M.D.   On: 12/16/2016 09:37     Medications:  Scheduled: . small volume/piggyback builder   Intravenous Q8H  . aspirin  325 mg Per Tube Daily  . chlorhexidine gluconate (MEDLINE KIT)  15 mL Mouth Rinse BID  . clonazePAM  2 mg Per Tube TID  . darunavir-cobicistat  1 tablet Oral Q breakfast  . dolutegravir  50 mg Oral Daily  . emtricitabine-tenofovir AF  1 tablet Per Tube Daily  . famotidine  20 mg Per Tube BID  . fentaNYL  50 mcg Transdermal Q72H  . heparin  5,000 Units Subcutaneous Q8H  . [START ON 12/18/2016] lactulose  45 g Per Tube Daily  . LORazepam  2 mg Intravenous Q6H  . mouth rinse  15 mL Mouth Rinse QID  . [START ON 12/20/2016] methylPREDNISolone (SOLU-MEDROL) injection  40 mg Intravenous Q8H  . methylPREDNISolone (SOLU-MEDROL) injection  500 mg Intravenous Q12H  . polyethylene  glycol  17 g Per Tube Daily  . sulfamethoxazole-trimethoprim  20 mL Per Tube Daily   Continuous: . feeding supplement (VITAL AF 1.2 CAL) Stopped (12/16/16 1205)  . insulin (NOVOLIN-R) infusion 0.8 Units/hr (12/17/16 1027)   QVZ:DGLOVF chloride, acetaminophen, chlorproMAZINE (THORAZINE) IV, docusate, fentaNYL (SUBLIMAZE) injection, haloperidol lactate, hydrALAZINE, ibuprofen, midazolam, sennosides, sodium chloride flush  Assessment/Plan:  Active Problems:   COPD (chronic obstructive pulmonary disease) (HCC)   Substance abuse   Genital warts   HIV (human immunodeficiency virus infection) ([place])   AIDS (acquired immune deficiency syndrome) ([place])   Noncompliance   Bacterial meningitis   Hypertensive urgency   Septic shock (HCC)   Transaminitis   Cocaine abuse   Open wound   Acute respiratory failure with hypoxia (HCC)   Altered mental status   Acute encephalopathy   Cerebral embolism with cerebral infarction   Endotracheally intubated   Meningoencephalitis   Neurosyphilis   Herpesviral meningitis   Paraplegia ([place])   Acute pulmonary edema (HCC)   Transverse myelitis ([place])   Encounter for orogastric (OG) tube placement   Somnolence   Abdominal distention   Bowel obstruction    Acute Hypoxemic Respiratory Failure  This was secondary to HCAP ESBL Klebsiella LLL+ sepsis + Unable to protect airway + pulm edema. Patient was on the ventilator for a prolonged duration. Subsequently underwent tracheostomy placement on 2/5. Pulmonology is following. Patient is now off of the Precedex infusion.   Meningitis bacterial + viral. No organisms isolated except for HSV. Differentials : fungal infxn, septic emboli, neurosyphillis, CMV based on MRI. 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 Infectious disease is following. Patient is on acyclovir. Off of meropenem. Also on Bactrim for PCP prophylaxis.  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  Patient is on acyclovir. High-dose steroids, started 2/7 for  5 days. Neurology has seen the patient. On fentanyl patch for pain issues. Started during this hospitalization.  Abdominal distention/ileus Patient had NG tube placed yesterday. Abdomen appears to be less distended today. According to the nursing staff. He is having more bowel movement. Some of this is quite watery. CT scan of the abdomen and pelvis did show bowel distention with concerns for ileus. Abdominal films to be repeated today. He is afebrile. His abdomen is nontender to palpation. Hold off for any stool studies for now. WBC has been normal. Continue to hold feeding.  Pulmonary edema Seems to have improved. Continues to be on Lasix, which will be continued for now.   Moderate to Severe Protein Calorie Malnutrition  Feedings on hold due to ileus.  Transaminitis Worsened by seroquel likely which was discontinued on 1/31.  Improved.   HCV ab+, Chronic hep b Management will be pursued as outpatient.  Normocytic anemia Hemoglobin stable. No evidence for overt bleeding. Continue to monitor.  LLL PNA with ESBL Klebsiella  Completed treatment. Monitor WBC.  HIV On antiretroviral treatment. Infectious disease is following.  Hyperglycemia Unknown if patient has a history of diabetes. HbA1c 6.6 on 1/26. CBGs were extremely elevated due to steroids. He was initially placed on Lantus. However, CBGs were not adequately controlled. So, started on insulin infusion. Diabetes coordinator is following.   Metabolic Encephalopathy Continue to monitor. Seems to be stable. On clonazepam.  History of Cocaine Abuse Will need counseling when able to communicate effectively  Small acute CVA in cerebellum and cerebral cortex Stroke could be due to vasospasm from meningitis. On aspirin. Echocardiogram shows normal systolic function. LDL 93. PT and OT evaluation. No significant carotid stenosis noted on MRA neck.   DVT Prophylaxis:  Subcutaneous heparin    Code Status: Full code  Family Communication: No family at bedside  Disposition Plan: Management as outlined above. Should be okay for stepdown unit.    LOS: 16 days   [facility] Pager [phone] 12/17/2016, 11:27 AM  If 7PM-7AM, please contact night-coverage at [email or web address], password [facility]

## 2016-12-18 ENCOUNTER — Inpatient Hospital Stay (HOSPITAL_COMMUNITY): Payer: Medicaid Other

## 2016-12-18 LAB — CBC
HCT: 31.1 % — ABNORMAL LOW (ref 39.0–52.0)
Hemoglobin: 9.7 g/dL — ABNORMAL LOW (ref 13.0–17.0)
MCH: 30.2 pg (ref 26.0–34.0)
MCHC: 31.2 g/dL (ref 30.0–36.0)
MCV: 96.9 fL (ref 78.0–100.0)
PLATELETS: 444 10*3/uL — AB (ref 150–400)
RBC: 3.21 MIL/uL — ABNORMAL LOW (ref 4.22–5.81)
RDW: 17.5 % — AB (ref 11.5–15.5)
WBC: 8.4 10*3/uL (ref 4.0–10.5)

## 2016-12-18 LAB — GLUCOSE, CAPILLARY
GLUCOSE-CAPILLARY: 191 mg/dL — AB (ref 65–99)
GLUCOSE-CAPILLARY: 170 mg/dL — AB (ref 65–99)
GLUCOSE-CAPILLARY: 126 mg/dL — AB (ref 65–99)
GLUCOSE-CAPILLARY: 145 mg/dL — AB (ref 65–99)
GLUCOSE-CAPILLARY: 161 mg/dL — AB (ref 65–99)
GLUCOSE-CAPILLARY: 166 mg/dL — AB (ref 65–99)
GLUCOSE-CAPILLARY: 163 mg/dL — AB (ref 65–99)
GLUCOSE-CAPILLARY: 157 mg/dL — AB (ref 65–99)
GLUCOSE-CAPILLARY: 173 mg/dL — AB (ref 65–99)
Glucose-Capillary: 127 mg/dL — ABNORMAL HIGH (ref 65–99)
Glucose-Capillary: 142 mg/dL — ABNORMAL HIGH (ref 65–99)
Glucose-Capillary: 151 mg/dL — ABNORMAL HIGH (ref 65–99)
Glucose-Capillary: 158 mg/dL — ABNORMAL HIGH (ref 65–99)
Glucose-Capillary: 160 mg/dL — ABNORMAL HIGH (ref 65–99)
Glucose-Capillary: 166 mg/dL — ABNORMAL HIGH (ref 65–99)
Glucose-Capillary: 169 mg/dL — ABNORMAL HIGH (ref 65–99)
Glucose-Capillary: 173 mg/dL — ABNORMAL HIGH (ref 65–99)
Glucose-Capillary: 174 mg/dL — ABNORMAL HIGH (ref 65–99)
Glucose-Capillary: 191 mg/dL — ABNORMAL HIGH (ref 65–99)
Glucose-Capillary: 203 mg/dL — ABNORMAL HIGH (ref 65–99)

## 2016-12-18 LAB — BASIC METABOLIC PANEL
Anion gap: 11 (ref 5–15)
BUN: 49 mg/dL — ABNORMAL HIGH (ref 6–20)
CHLORIDE: 107 mmol/L (ref 101–111)
CO2: 27 mmol/L (ref 22–32)
CREATININE: 1.03 mg/dL (ref 0.61–1.24)
Calcium: 9.2 mg/dL (ref 8.9–10.3)
GFR calc Af Amer: >60 mL/min (ref >60)
GFR calc non Af Amer: >60 mL/min (ref >60)
GLUCOSE: 192 mg/dL — AB (ref 65–99)
Potassium: 3.6 mmol/L (ref 3.5–5.1)
SODIUM: 145 mmol/L (ref 135–145)

## 2016-12-18 NOTE — Progress Notes (Signed)
[facility] PROGRESS NOTE  HUAN POLLOK PXT:[id number] DOB: [birth year] DOA: 12/01/2016  PCP: No primary care provider on file.  Brief History/Interval Summary: 51 year old man with HIV/AIDS (first diagnosed 2003, last CD4 count 245 with viremia of 1,160 in October 2017; on Genovya, Darunavir and Bactrim), Genital Warts, COPD, and cocaine substance abuse who presented to [facility] on 1/24 with complaint of altered mental status. Per notes, patient may have recently had some type of surgery in [place] where his mother resides. Afebrile, tachycardic to 155, RR 18, hypertensive to 240/112. Patient was intubated for airway protection, CVL in IJ was placed, and LP was performed given his altered mental status. ID was consulted. Patient was placed on antibiotics. He subsequently had to undergo tracheostomy. Subsequently, patient developed an ileus. NG tube was placed to intermittent suction.  Reason for Visit: Meningitis. Transverse myelitis  Consultants: Infectious disease. Critical care medicine. Neurology.  STUDIES:  1/24 CXR >> ATX vs LLL infiltrate 1/25 CT head >> neg for acute intracranial process 1/25 LP cytology >> no malignant cells 1/25 LP >> yellow, cloudy, glucose 20, total protein >600, RBC 114>49, WBC 970, 36% neutrophil, 20% lymphs, 19% eos 1/28 BAL cell count >> 266 WBC, eos 1 1/28 BAL cytology >> no malignant cells 1/27 BAL for wet mount> neg for parasites 1/27 MRI brain >> small acute infarcts in cerebellum and cerebral cortex, sucal signal 1/29 LP >> Glucose 82, protein >600, WBC 600>885, eosinophils 28% 1/29 Echo >> LV EF 60-65%, indeterminent diastolic function, wall motion normal 1/30 EEG >> diffuse slowing and background suppression, no seizures 2/3 MRI thoracic/lumbar/cervical >> (-) abscess. Meningitis. R/O CMV   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 1/24 Cryptococcal [name]): negative 1/24 CSF  [facility]): NGTD 1/22 scrotal wound cx [name]): MRSA Toxoplasma [name]): Negative 1/25BCx x2 : NGTD 1/25 HCV ab + 1/24 Trach asp 1/24: NGTD 1/24MRSA PCR: MRSA pos 1/25 HIV quant: 80, CD4 10 1/25 CSF AFB: Smear neg, >> 1/25 CSF fungal cx: NGTD 1/25 CSF crypto ag: neg 1/25 CSF gram stain no organisms, cx: NGTD 1/27 strongyloides ab >> negative 1/27 coccidiomycosis ab >> 1/28 BAL AFB smear/cx> 1/28 BAL Fungus cx >> 1/28 BAL gram stain/Cx: 40K ESBL Klebsiella pneumoniae 1/28 BAL pneumocystis smear: Neg 1/27 O&P >> 1/29 CSF Cx >> no organisms on gram stain, >> NGTD 2/04 CSF CMV >> not done?   ANTIBIOTICS: Rocephin 1/24>>1/30 Vancomycin 1/24>>1/30; 2/2 >  Acyclovir 1/24>> 1/26, 1/27>> Ampicillin 1/24>> 1/27 Bactrim M/W/F for PCP prophylaxis Meropenem 1/30>> 2/1; 2/2 >  Pen G 2/1 >> 2/2 Cipro 2/1 >> 2/2  SIGNIFICANT EVENTS: 1/24  Transferred from [facility] 1/25  Underwent LP here 2/05  Trach 2/06  To [facility]  LINES/TUBES: L IJ CVL 1/24 >> ETT 1/24 >> 2/5 Foley 1/24 >> OGT 1/24 >> 2/5 Trach (df) 2/5 >>   Subjective/Interval History: Patient mentions that he has some pain in his back area. According to the nursing staff. He does not have any wounds there. It's possible that the rectal tube is the source of his pain. Difficult to communicate due to tracheostomy.  ROS: Unable to do.  Objective:  Vital Signs  Vitals:   12/18/16 0530 12/18/16 0600 12/18/16 0630 12/18/16 0700  BP: (!) 145/85 138/81 136/86 134/86  Pulse: 68 67 66 66  Resp: _0 Temp:      TempSrc:      SpO2: 94% 95% 95% 94%  Weight:  Height:        Intake/Output Summary (Last 24 hours) at 12/18/16 0749 Last data filed at 12/18/16 0700  Gross per 24 hour  Intake           530.19 ml  Output             2355 ml  Net         -1824.81 ml   Filed Weights   12/16/16 0500 12/17/16 0500 12/18/16 0500  Weight: 96.6 kg (212 lb 15.4 oz) 95.4 kg (210 lb 5.1 oz) 96.4 kg (212 lb 8.4  oz)    General appearance: Patient is awake and alert. Does not appear to be in any distress. Neck: Tracheostomy noted Resp: Coarse breath sounds bilaterally. No wheezing, rales or rhonchi. Diminished at the bases. Cardio: regular rate and rhythm, S1, S2 normal, no murmur, click, rub or gallop GI: Abdomen is noted to be much more soft today. Less distended. Bowel sounds are present, although sluggish. No masses or organomegaly. Slightly tender in the left. Extremities: Minimal edema noted bilateral lower extremities Pulses: 2+ and symmetric Neurologic: Patient is awake and alert. Moving his upper extremities. Not much movement noted in the lower extremities.  Lab Results:  Data Reviewed: I have personally reviewed following labs and imaging studies  CBC:  Recent Labs Lab 12/12/16 0319 12/13/16 0219 12/15/16 0430 12/16/16 0239 12/18/16 0328  WBC 12.3* 10.5 11.9* 8.8 8.4  HGB 9.0* 8.4* 9.3* 9.7* 9.7*  HCT 29.0* 26.7* 29.6* 31.7* 31.1*  MCV 97.0 97.8 97.4 96.9 96.9  PLT 356 321 396 443* 444*    Basic Metabolic Panel:  Recent Labs Lab 12/12/16 0319 12/13/16 0219 12/15/16 0439 12/16/16 0239 12/17/16 0900 12/18/16 0328  NA 133* 136 138 138 145 145  K 5.2* 4.1 3.5 4.0 4.0 3.6  CL 90* 97* 97* 102 107 107  CO2 34* 35* _0 GLUCOSE 245* 223* 219* 334* 140* 192*  BUN 23* 24* 28* 42* 55* 49*  CREATININE 0.83 0.82 0.79 0.93 1.08 1.03  CALCIUM 8.1* 8.4* 9.1 9.5 9.4 9.2  MG 1.7 2.0  --   --   --   --   PHOS 3.2 2.6 3.8  --   --   --     GFR: Estimated Creatinine Clearance: 104.2 mL/min (by C-G formula based on SCr of 1.03 mg/dL).  Liver Function Tests:  Recent Labs Lab 12/12/16 0319 12/13/16 0219 12/15/16 0439  AST 197* 126*  --   ALT 350* 248*  --   ALKPHOS 101 93  --   BILITOT 0.9 0.7  --   PROT 8.4* 7.6  --   ALBUMIN 1.8* 1.7* 1.9*     Coagulation Profile:  Recent Labs Lab 12/13/16 1433  INR 0.99    CBG:  Recent Labs Lab 12/18/16 0311  12/18/16 0413 12/18/16 0534 12/18/16 0636 12/18/16 0746  GLUCAP 203* 158* 151* 142* 127*     Recent Results (from the past 240 hour(s))  Culture, blood (routine x 2)     Status: None   Collection Time: 12/08/16  9:37 AM  Result Value Ref Range Status   Specimen Description BLOOD RIGHT ANTECUBITAL  Final   Special Requests BOTTLES DRAWN AEROBIC AND ANAEROBIC 5CC  Final   Culture NO GROWTH 5 DAYS  Final   Report Status 12/13/2016 FINAL  Final  Culture, blood (routine x 2)     Status: None   Collection Time: 12/08/16  9:41 AM  Result Value  Ref Range Status   Specimen Description BLOOD BLOOD RIGHT ARM  Final   Special Requests BOTTLES DRAWN AEROBIC AND ANAEROBIC 5CC  Final   Culture NO GROWTH 5 DAYS  Final   Report Status 12/13/2016 FINAL  Final      Radiology Studies: Ct Abdomen Pelvis Wo Contrast  Result Date: 12/17/2016 CLINICAL DATA:  Bowel obstruction EXAM: CT ABDOMEN AND PELVIS WITHOUT CONTRAST TECHNIQUE: Multidetector CT imaging of the abdomen and pelvis was performed following the standard protocol without IV contrast. COMPARISON:  12/16/2016, CT 11/29/2016 FINDINGS: Lower chest: Atelectasis or partial consolidation in the right lower lobe. Dense partial left lower lobe consolidation concerning for a pneumonia. Normal heart size. Hepatobiliary: No focal hepatic abnormality. The gallbladder is enlarged up to 5.1 cm. No calcified stones. No wall thickening. No biliary dilatation. Pancreas: Unremarkable. No pancreatic ductal dilatation or surrounding inflammatory changes. Spleen: Normal in size without focal abnormality. Adrenals/Urinary Tract: Adrenal glands are unremarkable. Kidneys are normal, without renal calculi, focal lesion, or hydronephrosis. Bladder contains a Foley catheter Stomach/Bowel: Esophageal tube is present in the stomach and the tip terminates in the proximal duodenum. There are borderline to slightly dilated fluid-filled loops of small bowel throughout the abdomen.  There is enlarged fluid filled right colon. No wall thickening. The appendix is not well identified. Vascular/Lymphatic: Non aneurysmal aorta. No grossly enlarged lymph nodes. Reproductive: No masses. Other: No free air.  No free fluid. Musculoskeletal: No acute or suspicious bone lesions IMPRESSION: 1. Borderline to mildly enlarged loops of small bowel throughout the abdomen with dilated fluid-filled right colon and transverse colon, findings are suggestive of an ileus. Partial bowel obstruction included in the differential but felt less likely. 2. Partial consolidation in the left lower lobe concerning for pneumonia. Electronically Signed   By: Donavan Foil M.D.   On: 12/17/2016 01:36   Dg Abd Portable 1v  Result Date: 12/17/2016 CLINICAL DATA:  Followup abdominal distension. EXAM: PORTABLE ABDOMEN - 1 VIEW FINDINGS: There is an NG tube with its tip in the antropyloric region of the stomach. There is also a feeding tube with its tip in the fourth portion of the duodenum. The lung bases are grossly clear. Persistent air distended small bowel and colon suggesting a diffuse ileus. No free air. IMPRESSION: Persistent diffuse ileus bowel gas pattern. NG and feeding tubes in good position. Electronically Signed   By: Marijo Sanes M.D.   On: 12/17/2016 09:39   Dg Abd Portable 1v  Result Date: 12/16/2016 CLINICAL DATA:  Assess nasogastric tube positioning. EXAM: PORTABLE ABDOMEN - 1 VIEW COMPARISON:  KUB of December 16, 2016 at 9:22 a.m. FINDINGS: The radiodense tipped feeding tube tip projects below the inferior margin of the image. The orogastric tube has its proximal port in the gastric cardia with the tip re-curved upon itself pointing toward the GE junction. There are loops of moderately distended gas-filled small and large bowel present. There is dense atelectasis or pneumonia in the left lower lobe. IMPRESSION: The tip of the feeding tube projects below the inferior margin of the image. Both the proximal  port and the tip of the orogastric tube lie in the region of the gastric cardia but the tip of the tube is directed back toward the GE junction. Electronically Signed   By: David  Martinique M.D.   On: 12/16/2016 15:58   Dg Abd Portable 1v  Result Date: 12/16/2016 CLINICAL DATA:  Abdominal distention EXAM: PORTABLE ABDOMEN - 1 VIEW COMPARISON:  December 14, 2016 FINDINGS: Feeding  tube tip is in the third portion of the duodenum, unchanged. There are multiple loops of dilated bowel without bowel wall thickening. No free air evident. IMPRESSION: Bowel gas pattern is concerning for ileus or possibly early bowel obstruction. No demonstrable free air. Feeding tube tip in third portion duodenum. Electronically Signed   By: Lowella Grip III M.D.   On: 12/16/2016 09:37     Medications:  Scheduled: . small volume/piggyback builder   Intravenous Q8H  . aspirin  325 mg Per Tube Daily  . chlorhexidine gluconate (MEDLINE KIT)  15 mL Mouth Rinse BID  . clonazePAM  2 mg Per Tube TID  . darunavir-cobicistat  1 tablet Oral Q breakfast  . dolutegravir  50 mg Oral Daily  . emtricitabine-tenofovir AF  1 tablet Per Tube Daily  . famotidine  20 mg Per Tube BID  . fentaNYL  50 mcg Transdermal Q72H  . heparin  5,000 Units Subcutaneous Q8H  . lactulose  45 g Per Tube Daily  . LORazepam  2 mg Intravenous Q6H  . mouth rinse  15 mL Mouth Rinse QID  . [START ON 12/20/2016] methylPREDNISolone (SOLU-MEDROL) injection  40 mg Intravenous Q8H  . methylPREDNISolone (SOLU-MEDROL) injection  500 mg Intravenous Q12H  . polyethylene glycol  17 g Per Tube Daily  . sulfamethoxazole-trimethoprim  20 mL Per Tube Daily   Continuous: . feeding supplement (VITAL AF 1.2 CAL) Stopped (12/16/16 1205)  . insulin (NOVOLIN-R) infusion 2 Units/hr (12/18/16 0746)   WSF:KCLEXN chloride, acetaminophen, chlorproMAZINE (THORAZINE) IV, docusate, fentaNYL (SUBLIMAZE) injection, haloperidol lactate, hydrALAZINE, ibuprofen, midazolam, sennosides,  sodium chloride flush  Assessment/Plan:  Active Problems:   COPD (chronic obstructive pulmonary disease) (HCC)   Substance abuse   Genital warts   HIV (human immunodeficiency virus infection) ([place])   AIDS (acquired immune deficiency syndrome) ([place])   Noncompliance   Bacterial meningitis   Hypertensive urgency   Septic shock (HCC)   Transaminitis   Cocaine abuse   Open wound   Acute respiratory failure (HCC)   Altered mental status   Acute encephalopathy   Cerebral embolism with cerebral infarction   Endotracheally intubated   Meningoencephalitis   Neurosyphilis   Herpesviral meningitis   Paraplegia ([place])   Acute pulmonary edema (HCC)   Transverse myelitis ([place])   Encounter for orogastric (OG) tube placement   Somnolence   Abdominal distention   Ileus (HCC)   Muscle weakness (generalized)    Acute Hypoxemic Respiratory Failure  This was secondary to HCAP ESBL Klebsiella LLL+ sepsis + Unable to protect airway + pulm edema. Patient was on the ventilator for a prolonged duration. Subsequently underwent tracheostomy placement on 2/5. Pulmonology is following. Patient is now off of the Precedex infusion. Weaning in process.  Meningitis No organisms isolated except for HSV. 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 Infectious disease has been following. Patient is on acyclovir. Off of meropenem. Also on Bactrim for PCP prophylaxis.  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 Patient is on acyclovir. High-dose steroids, started 2/7 for 5 days. Neurology has seen the patient. On fentanyl patch for pain issues. Started during this hospitalization.  Abdominal distention/ileus Patient had NG tube placed 2/8. Seems to be improving. X-ray from this morning shows improvement in the gaseous distention. Continue NG to suction for today. We will clamp tomorrow. Continue to hold onto feedings for now. Stool is watery. However, he is on laxatives  to help with his ileus. No need to test  the stool at this time. He is afebrile. WBC has been normal.   Hyperglycemia Unknown if patient has a history of diabetes. HbA1c 6.6 on 1/26. CBGs were extremely elevated due to steroids. He was initially placed on Lantus. However, CBGs were not adequately controlled. So, started on insulin infusion. Diabetes coordinator is following. Once he comes off of the high-dose steroids he can be transitioned to Lantus.  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 On aspirin. Echocardiogram shows normal systolic function. LDL 93. PT and OT evaluation. No significant carotid stenosis noted on MRA neck. She was seen by neurology. They had mentioned that a TEE could be considered once the patient was more stable. This can be pursued as an outpatient, although embolic stroke appeared to be less of a possibility based on his clinical scenario.  Pulmonary edema Seems to have improved. Continues to be on Lasix, which will be continued for now.   Moderate to Severe Protein Calorie Malnutrition  Feedings on hold due to ileus.  Transaminitis Worsened by seroquel likely which was discontinued on 1/31.  Improved.   HCV ab+, Chronic hep b Management will be pursued as outpatient.  Normocytic anemia Hemoglobin stable. No evidence for overt bleeding. Continue to monitor.  LLL PNA with ESBL Klebsiella  Completed treatment. Monitor WBC.  HIV On antiretroviral treatment. Infectious disease is following.  Metabolic Encephalopathy Continue to monitor. Seems to be stable. On clonazepam.  History of Cocaine Abuse Will need counseling when able to communicate effectively   DVT Prophylaxis: Subcutaneous heparin    Code Status: Full code  Family Communication: No family at bedside  Disposition Plan: Management as outlined above.    LOS: 17 days   [facility] Pager  [phone] 12/18/2016, 7:49 AM  If 7PM-7AM, please contact night-coverage at [email or web address], password [facility]

## 2016-12-18 NOTE — Progress Notes (Signed)
Pharmacy Antibiotic Note  Lawrence Kane is a 51 y.o. 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  Pharmacy has been consulted for acyclovir dosing. LFTs have been elevated this admission but are improving, renal function remains stable.   Plan: - Continue acyclovir 1000 mg IV q8h - Monitor renal and liver function, clinical progression and length of therapy  Height: _0  (193 cm) Weight: 212 lb 8.4 oz (96.4 kg) IBW/kg (Calculated) : 86.8  Temp (24hrs), Avg:97.9 F (36.6 C), Min:97.6 F (36.4 C), Max:98.2 F (36.8 C)   Recent Labs Lab 12/12/16 0319 12/12/16 1630 12/13/16 0219 12/15/16 0430 12/15/16 0439 12/16/16 0239 12/17/16 0900 12/18/16 0328  WBC 12.3*  --  10.5 11.9*  --  8.8  --  8.4  CREATININE 0.83  --  0.82  --  0.79 0.93 1.08 1.03  VANCOTROUGH  --  21*  --   --   --   --   --   --     Estimated Creatinine Clearance: 104.2 mL/min (by C-G formula based on SCr of 1.03 mg/dL).    Allergies  Allergen Reactions  . Iodine   . Ketorolac     unknown  . Tylenol [Acetaminophen]     unknown    Antimicrobials this admission: Ceftriaxone 1/24 (started at OSH) >> 1/30 Vancomycin 1/24 (started at OSH) >> 1/30; 2/2 >>2/5 Ampicillin 1/25 >>1/27 Acyclovir 1/25 >>1/26; 1/27>> Bactrim 1/25 (PCP prophylaxis) >> 1/29  2/6 >> Meropenem 1/30 >> 2/1; 2/2 >>2/6 Ciprofloxacin 2/1 >>2/2 PCN 2/1 >>2/2  Dose adjustments this admission: VT - 12 1/27 on 1 g q8h VT - 19 1/30 on 121m q8h: no changes VT  21 2/4 measured - corrects to ~18.5 >> cont 1250 mg/8h  Microbiology results: 1/22 scrotal wound MRSA 1/24 BCx x 2 (Lawrence Kane: 1/24 HSV PCR (Lawrence Kane: HSV2 POS 1/24 Cryptococcal (Lawrence Kane: 1/24 CSF (Lawrence Kane: gram stain no organism  1/24 Trach asp: ngtd 1/24 MRSA PCR: positive 1/25 BCx x2: ngtd 1/25 cryptococcal: negative 1/25 CSF culture (post abx): ngtd 1/25 CSF fungal Cx: neg 1/25 CSF AFB: neg 1/25 Toxoplasma:  neg 1/27 stronglyoids ab: negative 1/28 fungus from bronch: none observed 1/28 AFB bronch: neg 1/28 PCP bronch: neg 1/28 BAL: 40,000 klebsiella pneumonia ESBL. No parasites 1/29 CSF: ngtd 1/31 BCx: ng<12h 2/1 RPR: Neg  Thank you for allowing pharmacy to be a part of this patient's care.  Lawrence Kane PharmD, BCPS Pager # [phone]/08/2017 11:57 AM

## 2016-12-19 ENCOUNTER — Inpatient Hospital Stay (HOSPITAL_COMMUNITY): Payer: Medicaid Other

## 2016-12-19 LAB — COMPREHENSIVE METABOLIC PANEL
ALBUMIN: 2 g/dL — AB (ref 3.5–5.0)
ALT: 88 U/L — AB (ref 17–63)
AST: 40 U/L (ref 15–41)
Alkaline Phosphatase: 61 U/L (ref 38–126)
Anion gap: 9 (ref 5–15)
BILIRUBIN TOTAL: 0.5 mg/dL (ref 0.3–1.2)
BUN: 51 mg/dL — ABNORMAL HIGH (ref 6–20)
CHLORIDE: 113 mmol/L — AB (ref 101–111)
CO2: 27 mmol/L (ref 22–32)
CREATININE: 1.16 mg/dL (ref 0.61–1.24)
Calcium: 9.3 mg/dL (ref 8.9–10.3)
GFR calc Af Amer: >60 mL/min (ref >60)
GLUCOSE: 217 mg/dL — AB (ref 65–99)
POTASSIUM: 3.5 mmol/L (ref 3.5–5.1)
Sodium: 149 mmol/L — ABNORMAL HIGH (ref 135–145)
Total Protein: 7.9 g/dL (ref 6.5–8.1)

## 2016-12-19 LAB — GLUCOSE, CAPILLARY
GLUCOSE-CAPILLARY: 178 mg/dL — AB (ref 65–99)
GLUCOSE-CAPILLARY: 215 mg/dL — AB (ref 65–99)
GLUCOSE-CAPILLARY: 208 mg/dL — AB (ref 65–99)
GLUCOSE-CAPILLARY: 130 mg/dL — AB (ref 65–99)
GLUCOSE-CAPILLARY: 110 mg/dL — AB (ref 65–99)
GLUCOSE-CAPILLARY: 148 mg/dL — AB (ref 65–99)
GLUCOSE-CAPILLARY: 162 mg/dL — AB (ref 65–99)
GLUCOSE-CAPILLARY: 164 mg/dL — AB (ref 65–99)
Glucose-Capillary: 159 mg/dL — ABNORMAL HIGH (ref 65–99)
Glucose-Capillary: 166 mg/dL — ABNORMAL HIGH (ref 65–99)
Glucose-Capillary: 175 mg/dL — ABNORMAL HIGH (ref 65–99)
Glucose-Capillary: 176 mg/dL — ABNORMAL HIGH (ref 65–99)
Glucose-Capillary: 221 mg/dL — ABNORMAL HIGH (ref 65–99)
Glucose-Capillary: 236 mg/dL — ABNORMAL HIGH (ref 65–99)
Glucose-Capillary: 274 mg/dL — ABNORMAL HIGH (ref 65–99)
Glucose-Capillary: 281 mg/dL — ABNORMAL HIGH (ref 65–99)
Glucose-Capillary: 81 mg/dL (ref 65–99)
Glucose-Capillary: 85 mg/dL (ref 65–99)

## 2016-12-19 LAB — CBC
HEMATOCRIT: 30.9 % — AB (ref 39.0–52.0)
Hemoglobin: 9.4 g/dL — ABNORMAL LOW (ref 13.0–17.0)
MCH: 29.7 pg (ref 26.0–34.0)
MCHC: 30.4 g/dL (ref 30.0–36.0)
MCV: 97.8 fL (ref 78.0–100.0)
PLATELETS: 396 10*3/uL (ref 150–400)
RBC: 3.16 MIL/uL — AB (ref 4.22–5.81)
RDW: 17.3 % — AB (ref 11.5–15.5)
WBC: 9.2 10*3/uL (ref 4.0–10.5)

## 2016-12-19 LAB — MAGNESIUM: MAGNESIUM: 2.5 mg/dL — AB (ref 1.7–2.4)

## 2016-12-19 LAB — TSH: TSH: 0.982 u[IU]/mL (ref 0.350–4.500)

## 2016-12-19 MED ORDER — POTASSIUM CHLORIDE 20 MEQ/15ML (10%) PO SOLN
40.0000 meq | Freq: Once | ORAL | Status: AC
  Administered 2016-12-19: 40 meq
  Filled 2016-12-19: qty 30

## 2016-12-19 MED ORDER — LACTULOSE 10 GM/15ML PO SOLN
20.0000 g | Freq: Every day | ORAL | Status: DC
  Administered 2016-12-19: 20 g
  Filled 2016-12-19 (×2): qty 30

## 2016-12-19 NOTE — Progress Notes (Signed)
50 mcg fentanyl wasted in sink, witnessed by nurse felicia.

## 2016-12-19 NOTE — Progress Notes (Signed)
Subjective: Interval History:  Patient presented with altered mental status on January 24 and had extensive workup done. 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 He has history of HIV and currently on HAART therapy. 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  Objective: Vital signs in last 24 hours: Temp:  [97.6 F (36.4 C)-98.7 F (37.1 C)] 98 F (36.7 C) (02/11 0700) Pulse Rate:  [40-84] 40 (02/11 0804) Resp:  [17-31] 21 (02/11 0804) BP: (139-173)/(82-98) 173/92 (02/11 0804) SpO2:  [88 %-100 %] 100 % (02/11 0804) FiO2 (%):  [30 %-100 %] 40 % (02/11 0804) Weight:  [91.8 kg (202 lb 6.1 oz)] 91.8 kg (202 lb 6.1 oz) (02/11 0500)  Intake/Output from previous day: 02/10 0701 - 02/11 0700 In: 695.6 [I.V.:391.6; NG/GT:50; IV Piggyback:254] Out: 4098 [Urine:1600; Emesis/NG output:50] Intake/Output this shift: No intake/output data recorded. Nutritional status:    HEENT-  Normocephalic, no lesions, some nuchal rigidity is still present  Neurologic Examination: Mental status: Awake and alert. Has tracheostomy tube and unable to verbalize at this time. Cranial nerves: Pupils approximately 3 mm and reactive to light. Extra muscles intact as he tracks around. No facial droop is appreciated. Motor: Moving upper extremities normally. Bilateral lower extremity is complete paraplegia. Sensory: Grimaces to noxious ablation throughout   Lab Results:  Recent Labs  12/17/16 0900 12/18/16 0328  WBC  --  8.4  HGB  --  9.7*  HCT  --  31.1*  PLT  --  444*  NA 145 145  K 4.0 3.6  CL 107 107  CO2 28 27  GLUCOSE 140* 192*  BUN 55* 49*  CREATININE 1.08 1.03  CALCIUM 9.4 9.2   Lipid Panel No results for input(s): CHOL, TRIG, HDL, CHOLHDL, VLDL, LDLCALC in the last 72 hours.  Studies/Results: Dg Chest Port 1 View  Result Date: 12/19/2016 CLINICAL DATA:  Respiratory distress EXAM: PORTABLE CHEST 1 VIEW  COMPARISON:  12/16/2016 FINDINGS: Support devices are unchanged. Heart is borderline enlarged. Left lower lobe atelectasis or infiltrate with small left effusion. This is similar to prior study. No focal opacity on the right. IMPRESSION: Stable left lower lobe atelectasis or infiltrate with small left effusion. Electronically Signed   By: Rolm Baptise M.D.   On: 12/19/2016 07:32   Dg Abd Portable 1v  Result Date: 12/18/2016 CLINICAL DATA:  Ileus. EXAM: PORTABLE ABDOMEN - 1 VIEW COMPARISON:  12/17/2016. FINDINGS: Improved gaseous distention of the abdomen. Support tubes and apparatus appears stable. IMPRESSION: Improved gaseous distention. Electronically Signed   By: Staci Righter M.D.   On: 12/18/2016 08:26   Dg Abd Portable 1v  Result Date: 12/17/2016 CLINICAL DATA:  Followup abdominal distension. EXAM: PORTABLE ABDOMEN - 1 VIEW FINDINGS: There is an NG tube with its tip in the antropyloric region of the stomach. There is also a feeding tube with its tip in the fourth portion of the duodenum. The lung bases are grossly clear. Persistent air distended small bowel and colon suggesting a diffuse ileus. No free air. IMPRESSION: Persistent diffuse ileus bowel gas pattern. NG and feeding tubes in good position. Electronically Signed   By: Marijo Sanes M.D.   On: 12/17/2016 09:39    Medications:  Scheduled: . small volume/piggyback builder   Intravenous Q8H  . aspirin  325 mg Per Tube Daily  . chlorhexidine gluconate (MEDLINE KIT)  15 mL Mouth Rinse BID  . clonazePAM  2 mg Per Tube TID  .  darunavir-cobicistat  1 tablet Oral Q breakfast  . dolutegravir  50 mg Oral Daily  . emtricitabine-tenofovir AF  1 tablet Per Tube Daily  . famotidine  20 mg Per Tube BID  . fentaNYL  50 mcg Transdermal Q72H  . heparin  5,000 Units Subcutaneous Q8H  . lactulose  20 g Per Tube Daily  . LORazepam  2 mg Intravenous Q6H  . mouth rinse  15 mL Mouth Rinse QID  . [START ON 12/20/2016] methylPREDNISolone (SOLU-MEDROL)  injection  40 mg Intravenous Q8H  . methylPREDNISolone (SOLU-MEDROL) injection  500 mg Intravenous Q12H  . sulfamethoxazole-trimethoprim  20 mL Per Tube Daily   Continuous: . feeding supplement (VITAL AF 1.2 CAL) Stopped (12/16/16 1205)  . insulin (NOVOLIN-R) infusion 2.3 Units/hr (12/18/16 2303)   THF:HPDFGI chloride, acetaminophen, chlorproMAZINE (THORAZINE) IV, docusate, fentaNYL (SUBLIMAZE) injection, haloperidol lactate, hydrALAZINE, ibuprofen, midazolam, sennosides, sodium chloride flush  Assessment/Plan: 51 year old male with history of HIV disease, COPD, cocaine substance abuse presented with altered mental status and found to have HSV meningitis and transverse myelitis. I reviewed these images with neuroradiology and there is significant inflammatory component in his thoracic and lumbar spine. He is currently being treated with IV antibiotics and high dose steroids day 4/5.  Recommend -Agree with tapering dose afterwards. -Re imaging of spine with MRI in 2-3 weeks for improvement.    LOS: 18 days   Rochele Raring

## 2016-12-19 NOTE — Progress Notes (Signed)
[facility] PROGRESS NOTE  Lawrence Kane WCH:[id number] DOB: 08-28-1966 DOA: 12/01/2016  PCP: No primary care provider on file.  Brief History/Interval Summary: 51 year old man with HIV/AIDS (first diagnosed 2003, last CD4 count 245 with viremia of 1,160 in October 2017; on Genovya, Darunavir and Bactrim), Genital Warts, COPD, and cocaine substance abuse who presented to [facility] on 1/24 with complaint of altered mental status. Per notes, patient may have recently had some type of surgery in [place] where his mother resides. Afebrile, tachycardic to 155, RR 18, hypertensive to 240/112. Patient was intubated for airway protection, CVL in IJ was placed, and LP was performed given his altered mental status. ID was consulted. Patient was placed on antibiotics. He subsequently had to undergo tracheostomy. Subsequently, patient developed an ileus. NG tube was placed to intermittent suction.  Reason for Visit: Meningitis. Transverse myelitis  Consultants: Infectious disease. Critical care medicine. Neurology.  STUDIES:  1/24 CXR >> ATX vs LLL infiltrate 1/25 CT head >> neg for acute intracranial process 1/25 LP cytology >> no malignant cells 1/25 LP >> yellow, cloudy, glucose 20, total protein >600, RBC 114>49, WBC 970, 36% neutrophil, 20% lymphs, 19% eos 1/28 BAL cell count >> 266 WBC, eos 1 1/28 BAL cytology >> no malignant cells 1/27 BAL for wet mount> neg for parasites 1/27 MRI brain >> small acute infarcts in cerebellum and cerebral cortex, sucal signal 1/29 LP >> Glucose 82, protein >600, WBC 600>885, eosinophils 28% 1/29 Echo >> LV EF 60-65%, indeterminent diastolic function, wall motion normal 1/30 EEG >> diffuse slowing and background suppression, no seizures 2/3 MRI thoracic/lumbar/cervical >> (-) abscess. Meningitis. R/O CMV   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 1/24 Cryptococcal [name]): negative 1/24 CSF  [facility]): NGTD 1/22 scrotal wound cx [name]): MRSA Toxoplasma [name]): Negative 1/25BCx x2 : NGTD 1/25 HCV ab + 1/24 Trach asp 1/24: NGTD 1/24MRSA PCR: MRSA pos 1/25 HIV quant: 80, CD4 10 1/25 CSF AFB: Smear neg, >> 1/25 CSF fungal cx: NGTD 1/25 CSF crypto ag: neg 1/25 CSF gram stain no organisms, cx: NGTD 1/27 strongyloides ab >> negative 1/27 coccidiomycosis ab >> 1/28 BAL AFB smear/cx> 1/28 BAL Fungus cx >> 1/28 BAL gram stain/Cx: 40K ESBL Klebsiella pneumoniae 1/28 BAL pneumocystis smear: Neg 1/27 O&P >> 1/29 CSF Cx >> no organisms on gram stain, >> NGTD 2/04 CSF CMV >> not done?   ANTIBIOTICS: Rocephin 1/24>>1/30 Vancomycin 1/24>>1/30; 2/2 >  Acyclovir 1/24>> 1/26, 1/27>> Ampicillin 1/24>> 1/27 Bactrim M/W/F for PCP prophylaxis Meropenem 1/30>> 2/1; 2/2 >  Pen G 2/1 >> 2/2 Cipro 2/1 >> 2/2  SIGNIFICANT EVENTS: 1/24  Transferred from [facility] 1/25  Underwent LP here 2/05  Trach 2/06  To [facility] 2/8  patient developed ileus  LINES/TUBES: L IJ CVL 1/24 >> ETT 1/24 >> 2/5 Foley 1/24 >> OGT 1/24 >> 2/5 Trach (df) 2/5 >>   Subjective/Interval History: Patient partially sedated. Does open his eyes. Unable to communicate due to tracheostomy.  ROS: Unable to do.  Objective:  Vital Signs  Vitals:   12/19/16 0800 12/19/16 0804 12/19/16 0900 12/19/16 1000  BP: (!) 170/120 (!) 173/92 (!) 151/98 (!) 141/93  Pulse: (!) 46 (!) 40 (!) 52 (!) 48  Resp: 17 (!) _0 Temp:      TempSrc:      SpO2: 100% 100% 99%   Weight:      Height:        Intake/Output Summary (Last 24  hours) at 12/19/16 1041 Last data filed at 12/19/16 1000  Gross per 24 hour  Intake            722.6 ml  Output             1825 ml  Net          -1102.4 ml   Filed Weights   12/17/16 0500 12/18/16 0500 12/19/16 0500  Weight: 95.4 kg (210 lb 5.1 oz) 96.4 kg (212 lb 8.4 oz) 91.8 kg (202 lb 6.1 oz)    General appearance: Patient is awake and alert. Does not appear to be in  any distress. Neck: Tracheostomy noted Resp: Coarse breath sounds bilaterally. No wheezing, rales or rhonchi. Diminished at the bases. Cardio: S1, S2, is bradycardic, regular. No S3, S4. No rubs, murmurs, or bruit. Minimal pedal edema.  GI: Abdomen is soft. Not distended today. Bowel sounds are present. No masses or organomegaly. Nontender today.  Extremities: Minimal edema noted bilateral lower extremities Pulses: 2+ and symmetric Neurologic: Patient is awake and alert. Moving his upper extremities. Not much movement noted in the lower extremities.  Lab Results:  Data Reviewed: I have personally reviewed following labs and imaging studies  CBC:  Recent Labs Lab 12/13/16 0219 12/15/16 0430 12/16/16 0239 12/18/16 0328 12/19/16 0910  WBC 10.5 11.9* 8.8 8.4 9.2  HGB 8.4* 9.3* 9.7* 9.7* 9.4*  HCT 26.7* 29.6* 31.7* 31.1* 30.9*  MCV 97.8 97.4 96.9 96.9 97.8  PLT 321 396 443* 444* 631    Basic Metabolic Panel:  Recent Labs Lab 12/13/16 0219 12/15/16 0439 12/16/16 0239 12/17/16 0900 12/18/16 0328 12/19/16 0910  NA 136 138 138 145 145 149*  K 4.1 3.5 4.0 4.0 3.6 3.5  CL 97* 97* 102 107 107 113*  CO2 35* _0 GLUCOSE 223* 219* 334* 140* 192* 217*  BUN 24* 28* 42* 55* 49* 51*  CREATININE 0.82 0.79 0.93 1.08 1.03 1.16  CALCIUM 8.4* 9.1 9.5 9.4 9.2 9.3  MG 2.0  --   --   --   --  2.5*  PHOS 2.6 3.8  --   --   --   --     GFR: Estimated Creatinine Clearance: 92.5 mL/min (by C-G formula based on SCr of 1.16 mg/dL).  Liver Function Tests:  Recent Labs Lab 12/13/16 0219 12/15/16 0439 12/19/16 0910  AST 126*  --  40  ALT 248*  --  88*  ALKPHOS 93  --  61  BILITOT 0.7  --  0.5  PROT 7.6  --  7.9  ALBUMIN 1.7* 1.9* 2.0*     Coagulation Profile:  Recent Labs Lab 12/13/16 1433  INR 0.99    CBG:  Recent Labs Lab 12/19/16 0425 12/19/16 0530 12/19/16 0658 12/19/16 0754 12/19/16 0939  GLUCAP 236* 221* 274* 281* 208*     No results found for  this or any previous visit (from the past 240 hour(s)).    Radiology Studies: Dg Chest Port 1 View  Result Date: 12/19/2016 CLINICAL DATA:  Respiratory distress EXAM: PORTABLE CHEST 1 VIEW COMPARISON:  12/16/2016 FINDINGS: Support devices are unchanged. Heart is borderline enlarged. Left lower lobe atelectasis or infiltrate with small left effusion. This is similar to prior study. No focal opacity on the right. IMPRESSION: Stable left lower lobe atelectasis or infiltrate with small left effusion. Electronically Signed   By: Rolm Baptise M.D.   On: 12/19/2016 07:32   Dg Abd Portable 1v  Result Date:  12/19/2016 CLINICAL DATA:  Ileus EXAM: PORTABLE ABDOMEN - 1 VIEW COMPARISON:  12/18/2016 FINDINGS: NG tube is in the stomach. Feeding tube tip in the distal duodenum. Nonobstructive bowel gas pattern. Gas throughout nondistended large and small bowel. No free air organomegaly. IMPRESSION: Improved bowel distention.  Nonobstructive pattern currently. Feeding tube tip remains in the distal duodenum. Electronically Signed   By: Rolm Baptise M.D.   On: 12/19/2016 09:12   Dg Abd Portable 1v  Result Date: 12/18/2016 CLINICAL DATA:  Ileus. EXAM: PORTABLE ABDOMEN - 1 VIEW COMPARISON:  12/17/2016. FINDINGS: Improved gaseous distention of the abdomen. Support tubes and apparatus appears stable. IMPRESSION: Improved gaseous distention. Electronically Signed   By: Staci Righter M.D.   On: 12/18/2016 08:26     Medications:  Scheduled: . small volume/piggyback builder   Intravenous Q8H  . aspirin  325 mg Per Tube Daily  . chlorhexidine gluconate (MEDLINE KIT)  15 mL Mouth Rinse BID  . clonazePAM  2 mg Per Tube TID  . darunavir-cobicistat  1 tablet Oral Q breakfast  . dolutegravir  50 mg Oral Daily  . emtricitabine-tenofovir AF  1 tablet Per Tube Daily  . famotidine  20 mg Per Tube BID  . fentaNYL  50 mcg Transdermal Q72H  . heparin  5,000 Units Subcutaneous Q8H  . lactulose  20 g Per Tube Daily  .  LORazepam  2 mg Intravenous Q6H  . mouth rinse  15 mL Mouth Rinse QID  . [START ON 12/20/2016] methylPREDNISolone (SOLU-MEDROL) injection  40 mg Intravenous Q8H  . methylPREDNISolone (SOLU-MEDROL) injection  500 mg Intravenous Q12H  . potassium chloride  40 mEq Per Tube Once  . sulfamethoxazole-trimethoprim  20 mL Per Tube Daily   Continuous: . feeding supplement (VITAL AF 1.2 CAL) Stopped (12/16/16 1205)  . insulin (NOVOLIN-R) infusion 10.4 Units/hr (12/19/16 0940)   SWN:IOEVOJ chloride, acetaminophen, chlorproMAZINE (THORAZINE) IV, docusate, fentaNYL (SUBLIMAZE) injection, haloperidol lactate, hydrALAZINE, ibuprofen, midazolam, sennosides, sodium chloride flush  Assessment/Plan:  Active Problems:   COPD (chronic obstructive pulmonary disease) (HCC)   Substance abuse   Genital warts   HIV (human immunodeficiency virus infection) ([place])   AIDS (acquired immune deficiency syndrome) ([place])   Noncompliance   Bacterial meningitis   Hypertensive urgency   Septic shock (HCC)   Transaminitis   Cocaine abuse   Open wound   Acute respiratory failure (HCC)   Altered mental status   Acute encephalopathy   Cerebral embolism with cerebral infarction   Endotracheally intubated   Meningoencephalitis   Neurosyphilis   Herpesviral meningitis   Paraplegia ([place])   Acute pulmonary edema (HCC)   Transverse myelitis ([place])   Encounter for orogastric (OG) tube placement   Somnolence   Abdominal distention   Ileus (HCC)   Muscle weakness (generalized)    Acute Hypoxemic Respiratory Failure  This was secondary to HCAP ESBL Klebsiella LLL+ sepsis + Unable to protect airway + pulm edema. Patient was on the ventilator for a prolonged duration. Subsequently underwent tracheostomy placement on 2/5. Pulmonology is following. Patient is off of the Precedex infusion. Weaning in process.  Meningitis No organisms isolated except for HSV. 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 Infectious disease has been  following. Patient is on acyclovir. Off of meropenem. Also on Bactrim for PCP prophylaxis.  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 Patient is on acyclovir. High-dose steroids, started 2/7 for 5 days. Neurology has seen the patient. On fentanyl patch for pain issues.  Started during this hospitalization.  Ileus Patient had NG tube placed 2/8. Abdominal x-ray showing improvement in distention. Examination also suggests improvement. Clamp NG tube today. Repeat x-ray tomorrow morning. If ileus continues to improve, should be able to start tube feedings again tomorrow, perhaps at a lower rate. Reduce the dose of lactulose.   Hyperglycemia Unknown if patient has a history of diabetes. HbA1c 6.6 on 1/26. CBGs were extremely elevated due to steroids. He was initially placed on Lantus. However, CBGs were not adequately controlled. So, started on insulin infusion. Diabetes coordinator is following. Once he comes off of the high-dose steroids he can be transitioned to Lantus. Anticipate transitioning to Lantus tomorrow.  Bradycardia Noted to be bradycardic this morning. Patient was given Versed early this morning. NG tube was replaced her on the same time. Patient appears to be asymptomatic. Blood pressure is normal. EKG shows sinus bradycardia without evidence for ischemia. TSH is pending. Magnesium is okay. Potassium is on the lower normal side. We will replace potassium. Continue to closely monitor him. Bradycardia could be secondary to medications.  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 On aspirin. Echocardiogram shows normal systolic function. LDL 93. PT and OT evaluation. No significant carotid stenosis noted on MRA neck. She was seen by neurology. They had mentioned that a TEE could be considered once the patient was more stable. This can be pursued at a later date, although embolic stroke  appeared to be less of a possibility based on his clinical scenario.  Pulmonary edema Seems to have improved. Continues to be on Lasix, which will be continued for now.   Moderate to Severe Protein Calorie Malnutrition  Feedings on hold due to ileus. Anticipate resuming 2/12.  Transaminitis Worsened by seroquel likely which was discontinued on 1/31.  Improved.   HCV ab+, Chronic hep b Management will be pursued as outpatient.  Normocytic anemia Hemoglobin stable. No evidence for overt bleeding. Continue to monitor.  LLL PNA with ESBL Klebsiella  Completed treatment. Monitor WBC.  HIV On antiretroviral treatment. Infectious disease is following.  Metabolic Encephalopathy Continue to monitor. Seems to be stable. PRN Sedative agents for agitation.  History of Cocaine Abuse Will need counseling when able to communicate effectively   DVT Prophylaxis: Subcutaneous heparin    Code Status: Full code  Family Communication: No family at bedside  Disposition Plan: Management as outlined above. Will remain in step down unit.    LOS: 18 days   [facility] Pager [phone] 12/19/2016, 10:41 AM  If 7PM-7AM, please contact night-coverage at [email or web address], password [email or web address]

## 2016-12-20 ENCOUNTER — Inpatient Hospital Stay (HOSPITAL_COMMUNITY): Payer: Medicaid Other

## 2016-12-20 ENCOUNTER — Encounter (HOSPITAL_COMMUNITY): Payer: Self-pay

## 2016-12-20 DIAGNOSIS — T380X5A Adverse effect of glucocorticoids and synthetic analogues, initial encounter: Secondary | ICD-10-CM

## 2016-12-20 DIAGNOSIS — R739 Hyperglycemia, unspecified: Secondary | ICD-10-CM

## 2016-12-20 DIAGNOSIS — Z930 Tracheostomy status: Secondary | ICD-10-CM

## 2016-12-20 DIAGNOSIS — R0902 Hypoxemia: Secondary | ICD-10-CM

## 2016-12-20 DIAGNOSIS — J9601 Acute respiratory failure with hypoxia: Secondary | ICD-10-CM

## 2016-12-20 LAB — BASIC METABOLIC PANEL
Anion gap: 9 (ref 5–15)
BUN: 44 mg/dL — ABNORMAL HIGH (ref 6–20)
CHLORIDE: 119 mmol/L — AB (ref 101–111)
CO2: 23 mmol/L (ref 22–32)
Calcium: 9.4 mg/dL (ref 8.9–10.3)
Creatinine, Ser: 0.91 mg/dL (ref 0.61–1.24)
GFR calc non Af Amer: >60 mL/min (ref >60)
Glucose, Bld: 157 mg/dL — ABNORMAL HIGH (ref 65–99)
POTASSIUM: 4 mmol/L (ref 3.5–5.1)
SODIUM: 151 mmol/L — AB (ref 135–145)

## 2016-12-20 LAB — GLUCOSE, CAPILLARY
GLUCOSE-CAPILLARY: 138 mg/dL — AB (ref 65–99)
GLUCOSE-CAPILLARY: 181 mg/dL — AB (ref 65–99)
GLUCOSE-CAPILLARY: 219 mg/dL — AB (ref 65–99)
GLUCOSE-CAPILLARY: 154 mg/dL — AB (ref 65–99)
GLUCOSE-CAPILLARY: 139 mg/dL — AB (ref 65–99)
GLUCOSE-CAPILLARY: 158 mg/dL — AB (ref 65–99)
GLUCOSE-CAPILLARY: 188 mg/dL — AB (ref 65–99)
GLUCOSE-CAPILLARY: 203 mg/dL — AB (ref 65–99)
Glucose-Capillary: 127 mg/dL — ABNORMAL HIGH (ref 65–99)
Glucose-Capillary: 131 mg/dL — ABNORMAL HIGH (ref 65–99)
Glucose-Capillary: 184 mg/dL — ABNORMAL HIGH (ref 65–99)
Glucose-Capillary: 187 mg/dL — ABNORMAL HIGH (ref 65–99)
Glucose-Capillary: 234 mg/dL — ABNORMAL HIGH (ref 65–99)

## 2016-12-20 LAB — T-HELPER CELLS (CD4) COUNT (NOT AT ARMC)
CD4 T CELL HELPER: 16 % — AB (ref 33–55)
CD4 T Cell Abs: 190 /uL — ABNORMAL LOW (ref 400–2700)

## 2016-12-20 MED ORDER — LACTULOSE 10 GM/15ML PO SOLN
10.0000 g | Freq: Two times a day (BID) | ORAL | Status: DC
  Administered 2016-12-20 – 2016-12-27 (×6): 10 g
  Filled 2016-12-20 (×10): qty 15

## 2016-12-20 MED ORDER — INSULIN GLARGINE 100 UNIT/ML ~~LOC~~ SOLN
15.0000 [IU] | Freq: Two times a day (BID) | SUBCUTANEOUS | Status: DC
  Administered 2016-12-20 (×2): 15 [IU] via SUBCUTANEOUS
  Filled 2016-12-20 (×3): qty 0.15

## 2016-12-20 MED ORDER — VITAL AF 1.2 CAL PO LIQD
1000.0000 mL | ORAL | Status: DC
  Administered 2016-12-20: 1000 mL

## 2016-12-20 MED ORDER — FREE WATER
200.0000 mL | Freq: Three times a day (TID) | Status: DC
  Administered 2016-12-20 – 2016-12-21 (×3): 200 mL

## 2016-12-20 MED ORDER — INSULIN ASPART 100 UNIT/ML ~~LOC~~ SOLN
0.0000 [IU] | SUBCUTANEOUS | Status: DC
  Administered 2016-12-20 (×2): 5 [IU] via SUBCUTANEOUS
  Administered 2016-12-20 (×2): 3 [IU] via SUBCUTANEOUS
  Administered 2016-12-21 (×2): 5 [IU] via SUBCUTANEOUS
  Administered 2016-12-21: 3 [IU] via SUBCUTANEOUS
  Administered 2016-12-21: 5 [IU] via SUBCUTANEOUS
  Administered 2016-12-21 – 2016-12-22 (×2): 3 [IU] via SUBCUTANEOUS
  Administered 2016-12-22: 2 [IU] via SUBCUTANEOUS
  Administered 2016-12-22: 3 [IU] via SUBCUTANEOUS
  Administered 2016-12-22 (×2): 5 [IU] via SUBCUTANEOUS
  Administered 2016-12-22: 3 [IU] via SUBCUTANEOUS
  Administered 2016-12-23: 11 [IU] via SUBCUTANEOUS
  Administered 2016-12-23: 3 [IU] via SUBCUTANEOUS
  Administered 2016-12-23 (×2): 8 [IU] via SUBCUTANEOUS
  Administered 2016-12-24: 2 [IU] via SUBCUTANEOUS
  Administered 2016-12-24 (×2): 3 [IU] via SUBCUTANEOUS
  Administered 2016-12-24 – 2016-12-27 (×4): 2 [IU] via SUBCUTANEOUS
  Administered 2016-12-27: 5 [IU] via SUBCUTANEOUS
  Administered 2016-12-27 – 2016-12-28 (×3): 3 [IU] via SUBCUTANEOUS
  Administered 2016-12-28: 8 [IU] via SUBCUTANEOUS
  Administered 2016-12-28: 11 [IU] via SUBCUTANEOUS
  Administered 2016-12-28: 3 [IU] via SUBCUTANEOUS
  Administered 2016-12-28 – 2016-12-29 (×3): 8 [IU] via SUBCUTANEOUS
  Administered 2016-12-29: 3 [IU] via SUBCUTANEOUS
  Administered 2016-12-29: 2 [IU] via SUBCUTANEOUS
  Administered 2016-12-29: 11 [IU] via SUBCUTANEOUS
  Administered 2016-12-30: 2 [IU] via SUBCUTANEOUS
  Administered 2016-12-30: 3 [IU] via SUBCUTANEOUS
  Administered 2016-12-30: 2 [IU] via SUBCUTANEOUS
  Administered 2016-12-30: 5 [IU] via SUBCUTANEOUS
  Administered 2016-12-31 – 2017-01-01 (×4): 2 [IU] via SUBCUTANEOUS
  Administered 2017-01-03: 1 [IU] via SUBCUTANEOUS
  Administered 2017-01-03: 2 [IU] via SUBCUTANEOUS
  Administered 2017-01-03: 3 [IU] via SUBCUTANEOUS

## 2016-12-20 NOTE — Progress Notes (Signed)
PULMONARY / CRITICAL CARE MEDICINE   Name: Lawrence Kane MRN: [id number] DOB: [birth year]    ADMISSION DATE:  12/01/2016 CONSULTATION DATE:  12/01/2016  REFERRING MD:  [name] transfer  CHIEF COMPLAINT:  AMS  BRIEF SUMMARY:   50 year old man with HIV/AIDS (first diagnosed 2003, last CD4 count 245 with viremia of 1,160 in October 2017; on Genovya, Darunavir and Bactrim), Genital Warts, COPD, and cocaine substance abuse who presented to [facility] on 1/24 with complaint of altered mental status. Per notes, patient may have recently had some type of surgery in [place] where his mother resides. Afebrile, tachycardic to 155, RR 18, hypertensive to 240/112. Patient was intubated for airway protection, CVL in IJ was placed, and LP was performed given his altered mental status. He was started on Versed and propofol for sedation. Initial labs showed UA negative for infection. UDS positive for cocaine. CSF appeared yellow, cloudy, xanthochromic, WBC 4845, RBC 625, glucose <20, total protein >300, Neutrophils 16%, Eos 64%, monocytes 17%, lymphocytes 3%. Gram stain performed, but unable to view results. ID was consulted. Dr. Baxter Flattery recommended continuing Truvada and Tivicay through NGT.  Prolonged respiratory failure s/p trach (2/5).  Weaned to ATC 2/6.    SUBJECTIVE:  RN reports no acute events overnight, remains on ATC.  Pt denies SOB/chest pain, abd pain.     VITAL SIGNS: BP (!) 143/105   Pulse 65   Temp 98.2 F (36.8 C) (Oral)   Resp (!) 23   Ht _0  (1.93 m)   Wt 210 lb 5.1 oz (95.4 kg)   SpO2 98%   BMI 25.60 kg/m   VENTILATOR SETTINGS: Vent Mode: PRVC FiO2 (%):  [40 %-60 %] 50 % Set Rate:  [16 bmp] 16 bmp Vt Set:  [620 mL] 620 mL PEEP:  [5 cmH20] 5 cmH20 Pressure Support:  [5 cmH20] 5 cmH20 Plateau Pressure:  [17 cmH20-24 cmH20] 19 cmH20  INTAKE / OUTPUT: I/O last 3 completed shifts: In: 2596.1 [P.O.:3; I.V.:770.8; NG/GT:1606.3; IV Piggyback:216] Out: 2815 [Urine:2590;  Emesis/NG output:225]  PHYSICAL EXAMINATION: General: chronically ill appearing M in NAD on ATC HEENT: MM pink/moist, trach midline c/d/i, creamy white secretions noted PSY: calm Neuro: Awake, alert, follows commands, moves UE, no follow command on LE's CV: s1s2 rrr, no m/r/g PULM: even/non-labored, lungs bilaterally with scattered rhonchi  PI:RJJO, non-tender, bsx4 active  Extremities: warm/dry, no edema  Skin: no rashes or lesions   LABS:  BMET  Recent Labs Lab 12/13/16 0219 12/15/16 0439 12/16/16 0239  NA 136 138 138  K 4.1 3.5 4.0  CL 97* 97* 102  CO2 35* 30 22  BUN 24* 28* 42*  CREATININE 0.82 0.79 0.93  GLUCOSE 223* 219* 334*    Electrolytes  Recent Labs Lab 12/11/16 0330 12/12/16 0319 12/13/16 0219 12/15/16 0439 12/16/16 0239  CALCIUM 8.2* 8.1* 8.4* 9.1 9.5  MG 1.9 1.7 2.0  --   --   PHOS 3.6 3.2 2.6 3.8  --     CBC  Recent Labs Lab 12/13/16 0219 12/15/16 0430 12/16/16 0239  WBC 10.5 11.9* 8.8  HGB 8.4* 9.3* 9.7*  HCT 26.7* 29.6* 31.7*  PLT 321 396 443*    Coag's  Recent Labs Lab 12/13/16 1433  APTT 35  INR 0.99    Sepsis Markers No results for input(s): LATICACIDVEN, PROCALCITON, O2SATVEN in the last 168 hours.  ABG No results for input(s): PHART, PCO2ART, PO2ART in the last 168 hours.  Liver Enzymes  Recent Labs Lab 12/11/16 0330 12/12/16 0319  12/13/16 0219 12/15/16 0439  AST 322* 197* 126*  --   ALT 391* 350* 248*  --   ALKPHOS 94 101 93  --   BILITOT 0.9 0.9 0.7  --   ALBUMIN 1.6* 1.8* 1.7* 1.9*    Cardiac Enzymes No results for input(s): TROPONINI, PROBNP in the last 168 hours.  Glucose  Recent Labs Lab 12/17/16 0226 12/17/16 0329 12/17/16 0426 12/17/16 0535 12/17/16 0643 12/17/16 0813  GLUCAP 142* 135* 149* 144* 153* 134*    Imaging Ct Abdomen Pelvis Wo Contrast  Result Date: 12/17/2016 CLINICAL DATA:  Bowel obstruction EXAM: CT ABDOMEN AND PELVIS WITHOUT CONTRAST TECHNIQUE: Multidetector CT  imaging of the abdomen and pelvis was performed following the standard protocol without IV contrast. COMPARISON:  12/16/2016, CT 11/29/2016 FINDINGS: Lower chest: Atelectasis or partial consolidation in the right lower lobe. Dense partial left lower lobe consolidation concerning for a pneumonia. Normal heart size. Hepatobiliary: No focal hepatic abnormality. The gallbladder is enlarged up to 5.1 cm. No calcified stones. No wall thickening. No biliary dilatation. Pancreas: Unremarkable. No pancreatic ductal dilatation or surrounding inflammatory changes. Spleen: Normal in size without focal abnormality. Adrenals/Urinary Tract: Adrenal glands are unremarkable. Kidneys are normal, without renal calculi, focal lesion, or hydronephrosis. Bladder contains a Foley catheter Stomach/Bowel: Esophageal tube is present in the stomach and the tip terminates in the proximal duodenum. There are borderline to slightly dilated fluid-filled loops of small bowel throughout the abdomen. There is enlarged fluid filled right colon. No wall thickening. The appendix is not well identified. Vascular/Lymphatic: Non aneurysmal aorta. No grossly enlarged lymph nodes. Reproductive: No masses. Other: No free air.  No free fluid. Musculoskeletal: No acute or suspicious bone lesions IMPRESSION: 1. Borderline to mildly enlarged loops of small bowel throughout the abdomen with dilated fluid-filled right colon and transverse colon, findings are suggestive of an ileus. Partial bowel obstruction included in the differential but felt less likely. 2. Partial consolidation in the left lower lobe concerning for pneumonia. Electronically Signed   By: Donavan Foil M.D.   On: 12/17/2016 01:36   Dg Abd Portable 1v  Result Date: 12/16/2016 CLINICAL DATA:  Assess nasogastric tube positioning. EXAM: PORTABLE ABDOMEN - 1 VIEW COMPARISON:  KUB of December 16, 2016 at 9:22 a.m. FINDINGS: The radiodense tipped feeding tube tip projects below the inferior margin of  the image. The orogastric tube has its proximal port in the gastric cardia with the tip re-curved upon itself pointing toward the GE junction. There are loops of moderately distended gas-filled small and large bowel present. There is dense atelectasis or pneumonia in the left lower lobe. IMPRESSION: The tip of the feeding tube projects below the inferior margin of the image. Both the proximal port and the tip of the orogastric tube lie in the region of the gastric cardia but the tip of the tube is directed back toward the GE junction. Electronically Signed   By: David  Martinique M.D.   On: 12/16/2016 15:58   Dg Abd Portable 1v  Result Date: 12/16/2016 CLINICAL DATA:  Abdominal distention EXAM: PORTABLE ABDOMEN - 1 VIEW COMPARISON:  December 14, 2016 FINDINGS: Feeding tube tip is in the third portion of the duodenum, unchanged. There are multiple loops of dilated bowel without bowel wall thickening. No free air evident. IMPRESSION: Bowel gas pattern is concerning for ileus or possibly early bowel obstruction. No demonstrable free air. Feeding tube tip in third portion duodenum. Electronically Signed   By: Lowella Grip III M.D.   On:  12/16/2016 09:37    STUDIES:  1/24 CXR >> ATX vs LLL infiltrate 1/25 CT head >> neg for acute intracranial process 1/25 LP cytology >> no malignant cells 1/25 LP >> yellow, cloudy, glucose 20, total protein >600, RBC 114>49, WBC 970, 36% neut, 20% lymphs, 19% eos 1/28 BAL cell count >> 266 WBC, eos 1 1/28 BAL cytology >> no malignant cells 1/27 BAL for wet mount> neg for parasites 1/27 MRI brain >> small acute infarcts in cerebellum and cerebral cortex, sucal signal 1/29 LP >> Glucose 82, protein >600, WBC 600>885, eosinophils 28% 1/29 Echo >> LV EF 60-65%, indeterminent diastolic function, wall motion normal 1/30 EEG >> diffuse slowing and background suppression, no seizures 2/3 MRI thoracic/lumbar/cervical >> (-) abscess. Meningitis. R/O CMV 2/8 abd/pevlis CT >>  borderline to mildly enlarged loops of small bowel throughout the abd with dilated fluid filled R colon and transverses colon, findings are suggestive of an ileus. Partial bowel obstruction included in the differential, but felt less likely. Partial consolidation in the LLL concerning for PNA.    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 1/24 Cryptococcal [name]): negative 1/24 CSF [facility]): NGTD 1/22 scrotal wound cx [name]): MRSA Toxoplasma [name]): Negative 1/25 BCx x2 : NGTD 1/25 HCV ab + 1/24 Trach asp 1/24: NGTD 1/24 MRSA PCR: MRSA pos 1/25 HIV quant: 80, CD4 10 1/25 CSF AFB: Smear neg, >> 1/25 CSF fungal cx: NGTD 1/25 CSF crypto ag: neg 1/25 CSF gram stain no organisms, cx: NGTD 1/27 strongyloides ab >> negative 1/27 coccidiomycosis ab >> 1/28 BAL AFB smear/cx> 1/28 BAL Fungus cx >> 1/28 BAL gram stain/Cx: 40K ESBL Klebsiella pneumoniae 1/28 BAL pneumocystis smear: Neg 1/29 CSF Cx >> no organisms on gram stain, >>  2/04 CSF CMV >> not done?  ANTIBIOTICS: Rocephin 1/24>>1/30 Vancomycin 1/24>>1/30; 2/2 > 2/05 Acyclovir 1/24>> 1/26, 1/27>> Ampicillin 1/24>> 1/27 Bactrim M/W/F for PCP prophylaxis Meropenem 1/30>> 2/1; 2/2 > 2/6 Pen G 2/1 >> 2/2 Cipro 2/1 >> 2/2  SIGNIFICANT EVENTS: 1/24  Transferred from [facility] 1/25  Underwent LP here 2/05  Trach 2/06  To ATC 2/08  abd distention, likely ileus 2/12  Abd improved, tol TF, weaning on ATC  LINES/TUBES: L IJ CVL 1/24 >> ETT 1/24 >> 2/5 Foley 1/24 >> OGT 1/24 >> 2/5 Trach (df) 2/5 >> NGT to suction 2/08  DISCUSSION: 51 year old man with HIV/AIDS (first diagnosed 2003, last CD4 count 245 with viremia of 1,160 in October 2017; on Genovya, Darunavir and Bactrim), Genital Warts, COPD, and cocaine substance abuse who presented to [facility] on 1/24 with altered mental status. Intubated for airway protection. Prolonged vent wean due to ESBL klebsiella HCAP with  sepsis > trach 2/5.    ASSESSMENT / PLAN:  PULMONARY A: Acute Hypoxemic Respiratory Failure - HCAP ESBL Klebsiella LLL+ sepsis + Unable to protect airway + pulm edema Tracheostomy Status - 2/5 P:   ATC as tolerated  Wean O2 for sats > 90% Pulmonary hygiene as able - mobilize, upright positioning  Follow intermittent CXR Clip trach sutures 2/12  Monitor abdominal exam   CARDIOVASCULAR A:  Sinus Tachycardia - resolved Incomplete RBBB HTN Pulm edema P:  SDU status  TRH Primary SVC  PRN hydralazine ASA  Defer diuresis to primary   RENAL A:   At Risk AKI - in setting of critical illness, diuresis Hypernatremia P:   Trend BMP / UOP  Replace electrolytes as indicated Free water 200 ml Q8  GASTROINTESTINAL A:  Moderate to Severe Protein Calorie Malnutrition  Transaminitis, (worsened by seroquel likely which was discontinued on 1/31).  Improved.  HCV ab+, Chronic hep b Abdominal Distention - bowel ileus vs partial SBO Loose stools = ileus , less likely obstruction withoutput P:   NPO  Defer ileus mgmt to primary  Continue Pepcid Defer lactulose / colace to primary (initially for constipation) TF per primary  HEMATOLOGIC A:   Mild leukocytosis > Resolved Mild anemia P:  Heparin for DVT prophylaxis   INFECTIOUS A:   Meningitis, bacterial + viral. 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 P:   Neurology following > rec's for repeat MRI to follow up weakness  Consider thoracic / lumbar MRI with LE weakness Continue acyclovir Continue klonopin, fentanyl  Will need slow wean off above  Steroid taper began 2/12   GLOBAL:  TRH primary, PCCM following for trach care.  Clip trach sutures 2/12.    Noe Gens, NP-C [facility] Pgr: [phone] or if no answer [phone] 12/20/2016, 11:24 AM  Attending Note:  51 year old male with HIV/AIDS and substance abuse.  Was intubated for airway protection and subsequently trached.  Currently weaning on ATC with coarse BS on exam.  I reviewed CXR myself, trach in good position.  Discussed with TRH-MD and PCCM-NP.  Respiratory failure:  - Monitor for airway protection  - Maintain trach  Hypoxemia:  - Titrate O2 for sat of 88-92%.  Trach status:  - Remove sutures.  - May consider changing to a cuffless trach towards the end of the week.  PCCM will continue to follow.  Patient seen and examined, agree with above note.  I dictated the care and orders written for this patient under my direction.  Rush Farmer, MD [phone]

## 2016-12-20 NOTE — Care Management Note (Signed)
Case Management Note  Patient Details  Name: Lawrence NorthDarren E Kane MRN: [id number] Date of Birth: 1966/01/16  Subjective/Objective:     Pt admitted with bacterial menegitis               Action/Plan: PTA from home - pt remains ventilated. Bacterial meningitis, possible parasitic given elevated eosinophils, also would consider coccidiodes. Possible LLL PNA, HIV > CD4 10  . Plan is to keep pt on full support until Monday 12/06/16 to allow for possible recovery - tentative GOC meeting scheduled Monday.  Mom and sister are POC.  CM will continue to follow for discharge needs   Expected Discharge Date:                  Expected Discharge Plan:     In-House Referral:  Clinical Social Work  Discharge planning Services  CM Consult  Post Acute Care Choice:    Choice offered to:     DME Arranged:    DME Agency:     HH Arranged:    HH Agency:     Status of Service:  In process, will continue to follow  If discussed at Long Length of Stay Meetings, dates discussed:    Additional Comments: 12/20/2016 Pt is now on TC - will consult with CIR once pt has been on TC for at least 24 hours.  12/20/2016 Pt is not eligible at this time for CIR consideration due to ventilator  12/14/16 Discussed in LOS 12/14/16 - pt remains appropriate for continued stay.  Pt was trached yesterday - remains ventilated via trach.  Discharge more probable SNF as pt is not eligible for LTACH due to insurance coverage and lack of support in the home.  Pts support system includes mom and sister.  CSW consulted for potential discharge to SNF [name], Areeba Sulser S, RN 12/20/2016, 2:50 PM

## 2016-12-20 NOTE — Progress Notes (Signed)
CSW followed up on Vent SNF referrals:  [facility] Per Clydie BraunKaren in admissions, [name] has to be at least 4114 days old. Referral to be re-reviewed on 02/20  [facility]- VM left w/ admissions; awaiting return phone call  [facility]: VM left w/ admissions; awaiting return phone call  [facility]- Per Clydie BraunKaren in admissions, please refax referral for review. CSW re-faxed referral at 1015AM.  [facility]- VM left w/ admissions; awaiting return phone call  CSW continues to follow for disposition     Lawrence FlingAshley Ilyaas Kane, MSW, LCSW [facility] Clinical Social Worker [phone]

## 2016-12-20 NOTE — Progress Notes (Signed)
Subjective: Steroid therapy is being tapered at this point. He is without safety mits which is an improvement from last week.  Exam: Vitals:   12/20/16 1300 12/20/16 1400  BP: (!) 148/88 (!) 155/98  Pulse: 77 (!) 57  Resp: (!) 29 (!) 25  Temp:      HEENT-  Unable to examine pupils as he would roll his away from me. NG tube and trach collar in place.  Neurologic exam MS: arousable to sternal rub, unable to verbalize CN: Tracks some movement with eyes. Unable to examine oropharynx. 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   Recommendations: 1)Continue steroid taper. 2)Re-image spine in 2-3 weeks to assess for improvement.   No further neurologic recommendations at this time. We will sign off. Please call back for questions.  Heywood Ilesushil Dyan Labarbera, PGY3 12/20/2016, 2:31 PM

## 2016-12-20 NOTE — Progress Notes (Signed)
Physical Therapy Treatment Patient Details Name: Lawrence Kane MRN: [id number] DOB: 1966/07/29 Today's Date: 12/20/2016    History of Present Illness 51 yo admitted to [facility] on 1/24 with AMS and intubated. 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 Pt with bil LE flaccid since admission. PMHx:HIV/AIDS, COPD, cocaine use     PT Comments    Pt pleasant and following commands for bil UE movement. Pt continues to demonstrate undershooting with targets using RUE and slow but more direct movement of LUE. Continued paraplegic bil LE. Pt with improved sitting balance on PRVC 40% initially and throughout sitting with RT present end of session for transition to trach collar. Pt with moderate secretions throughout. Will continue to follow.   Follow Up Recommendations  LTACH;CIR     Equipment Recommendations  Wheelchair (measurements PT);Wheelchair cushion (measurements PT);Hospital bed    Recommendations for Other Services       Precautions / Restrictions Precautions Precautions: Fall Precaution Comments: vent, panda, restraints, bil LE flaccid Restrictions Weight Bearing Restrictions: No    Mobility  Bed Mobility Overal bed mobility: Needs Assistance Bed Mobility: Supine to Sit;Sit to Supine     Supine to sit: HOB elevated;Total assist;+2 for physical assistance;+2 for safety/equipment Sit to supine: Total assist;+2 for safety/equipment;+2 for physical assistance   General bed mobility comments: pt with total assist to bring bil LE off of and onto bed, assist to elevate trunk and control return to surface. Use of pad to rotate pelvis and scoot to EOB. total assist +2 to scoot to [facility]  Transfers                 General transfer comment: unsafe to attempt at this time  Ambulation/Gait                 Stairs            Wheelchair Mobility    Modified  Rankin (Stroke Patients Only)       Balance Overall balance assessment: Needs assistance   Sitting balance-Leahy Scale: Poor Sitting balance - Comments: pt EOB 8 min with varied anterior/posterior lean but improved from last session. pt with bil UE supporting self with min-mod assist for balance                            Cognition Arousal/Alertness: Awake/alert Behavior During Therapy: WFL for tasks assessed/performed Overall Cognitive Status: Difficult to assess                      Exercises      General Comments        Pertinent Vitals/Pain Pain Assessment: No/denies pain    Home Living                      Prior Function            PT Goals (current goals can now be found in the care plan section) Progress towards PT goals: Progressing toward goals    Frequency           PT Plan Current plan remains appropriate    Co-evaluation             End of Session   Activity Tolerance: Patient tolerated treatment well Patient left: in bed;with call bell/phone within reach;with restraints reapplied     Time: [phone]  PT Time Calculation (min) (ACUTE ONLY): 32 min  Charges:  $Therapeutic Activity: 23-37 mins                    G Codes:      Carlena Ruybal B Jereline Ticer 12/20/2016, 9:36 AM  Delaney MeigsMaija Tabor Breely Panik, PT [phone]

## 2016-12-20 NOTE — Progress Notes (Signed)
[facility] PROGRESS NOTE  DHIREN AZIMI XFG:[id number] DOB: [birth year] DOA: 12/01/2016  PCP: No primary care provider on file.  Brief History/Interval Summary: 51 year old man with HIV/AIDS (first diagnosed 2003, last CD4 count 245 with viremia of 1,160 in October 2017; on Genovya, Darunavir and Bactrim), Genital Warts, COPD, and cocaine substance abuse who presented to [facility] on 1/24 with complaint of altered mental status. Per notes, patient may have recently had some type of surgery in [place] where his mother resides. Afebrile, tachycardic to 155, RR 18, hypertensive to 240/112. Patient was intubated for airway protection, CVL in IJ was placed, and LP was performed given his altered mental status. ID was consulted. Patient was placed on antibiotics. He subsequently had to undergo tracheostomy. Subsequently, patient developed an ileus. NG tube was placed to intermittent suction.  Reason for Visit: Meningitis. Transverse myelitis. Ileus.  Consultants: Infectious disease. Critical care medicine. Neurology.  STUDIES:  1/24 CXR >> ATX vs LLL infiltrate 1/25 CT head >> neg for acute intracranial process 1/25 LP cytology >> no malignant cells 1/25 LP >> yellow, cloudy, glucose 20, total protein >600, RBC 114>49, WBC 970, 36% neutrophil, 20% lymphs, 19% eos 1/28 BAL cell count >> 266 WBC, eos 1 1/28 BAL cytology >> no malignant cells 1/27 BAL for wet mount> neg for parasites 1/27 MRI brain >> small acute infarcts in cerebellum and cerebral cortex, sucal signal 1/29 LP >> Glucose 82, protein >600, WBC 600>885, eosinophils 28% 1/29 Echo >> LV EF 60-65%, indeterminent diastolic function, wall motion normal 1/30 EEG >> diffuse slowing and background suppression, no seizures 2/3 MRI thoracic/lumbar/cervical >> (-) abscess. Meningitis. R/O CMV   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 1/24 Cryptococcal [name]): negative 1/24 CSF  [facility]): NGTD 1/22 scrotal wound cx [name]): MRSA Toxoplasma [name]): Negative 1/25BCx x2 : NGTD 1/25 HCV ab + 1/24 Trach asp 1/24: NGTD 1/24MRSA PCR: MRSA pos 1/25 HIV quant: 80, CD4 10 1/25 CSF AFB: Smear neg, >> 1/25 CSF fungal cx: NGTD 1/25 CSF crypto ag: neg 1/25 CSF gram stain no organisms, cx: NGTD 1/27 strongyloides ab >> negative 1/27 coccidiomycosis ab >> 1/28 BAL AFB smear/cx> 1/28 BAL Fungus cx >> 1/28 BAL gram stain/Cx: 40K ESBL Klebsiella pneumoniae 1/28 BAL pneumocystis smear: Neg 1/27 O&P >> 1/29 CSF Cx >> no organisms on gram stain, >> NGTD 2/04 CSF CMV >> not done?   ANTIBIOTICS: Rocephin 1/24>>1/30 Vancomycin 1/24>>1/30; 2/2 >  Acyclovir 1/24>> 1/26, 1/27>> Ampicillin 1/24>> 1/27 Bactrim M/W/F for PCP prophylaxis Meropenem 1/30>> 2/1; 2/2 >  Pen G 2/1 >> 2/2 Cipro 2/1 >> 2/2  SIGNIFICANT EVENTS: 1/24  Transferred from [facility] 1/25  Underwent LP here 2/05  Trach 2/06  To ATC 2/8  patient developed ileus  LINES/TUBES: L IJ CVL 1/24 >> ETT 1/24 >> 2/5 Foley 1/24 >> OGT 1/24 >> 2/5 Trach (df) 2/5 >>   Subjective/Interval History: Patient is trying to communicate but unable to do so due to tracheostomy.   ROS: Unable to do.  Objective:  Vital Signs  Vitals:   12/20/16 0800 12/20/16 0807 12/20/16 0840 12/20/16 0900  BP: (!) 158/103  (!) 158/103 (!) 166/99  Pulse: 64  67 66  Resp: (!) 31  (!) 24 (!) 22  Temp:  98.2 F (36.8 C)    TempSrc:  Oral    SpO2: 100%  98% 100%  Weight:      Height:        Intake/Output Summary (  Last 24 hours) at 12/20/16 1016 Last data filed at 12/20/16 0900  Gross per 24 hour  Intake           760.27 ml  Output             1390 ml  Net          -629.73 ml   Filed Weights   12/18/16 0500 12/19/16 0500 12/20/16 0500  Weight: 96.4 kg (212 lb 8.4 oz) 91.8 kg (202 lb 6.1 oz) 91.4 kg (201 lb 8 oz)    General appearance: Patient is awake and alert. Does not appear to be in any  distress. Neck: Tracheostomy noted Resp: Good air entry bilaterally. No wheezing, rales or rhonchi. Somewhat diminished at the bases.  Cardio: S1, S2, is bradycardic, regular. No S3, S4. No rubs, murmurs, or bruit. Minimal pedal edema.  GI: Abdomen remains soft. Not distended. Bowel sounds are present. No masses or organomegaly. Nontender.  Extremities: Minimal edema noted bilateral lower extremities Pulses: 2+ and symmetric Neurologic: Patient is awake and alert. Moving his upper extremities. Not much movement noted in the lower extremities.  Lab Results:  Data Reviewed: I have personally reviewed following labs and imaging studies  CBC:  Recent Labs Lab 12/15/16 0430 12/16/16 0239 12/18/16 0328 12/19/16 0910  WBC 11.9* 8.8 8.4 9.2  HGB 9.3* 9.7* 9.7* 9.4*  HCT 29.6* 31.7* 31.1* 30.9*  MCV 97.4 96.9 96.9 97.8  PLT 396 443* 444* 416    Basic Metabolic Panel:  Recent Labs Lab 12/15/16 0439 12/16/16 0239 12/17/16 0900 12/18/16 0328 12/19/16 0910  NA 138 138 145 145 149*  K 3.5 4.0 4.0 3.6 3.5  CL 97* 102 107 107 113*  CO2 _0 GLUCOSE 219* 334* 140* 192* 217*  BUN 28* 42* 55* 49* 51*  CREATININE 0.79 0.93 1.08 1.03 1.16  CALCIUM 9.1 9.5 9.4 9.2 9.3  MG  --   --   --   --  2.5*  PHOS 3.8  --   --   --   --     GFR: Estimated Creatinine Clearance: 92.5 mL/min (by C-G formula based on SCr of 1.16 mg/dL).  Liver Function Tests:  Recent Labs Lab 12/15/16 0439 12/19/16 0910  AST  --  40  ALT  --  88*  ALKPHOS  --  61  BILITOT  --  0.5  PROT  --  7.9  ALBUMIN 1.9* 2.0*     Coagulation Profile:  Recent Labs Lab 12/13/16 1433  INR 0.99    CBG:  Recent Labs Lab 12/20/16 0436 12/20/16 0546 12/20/16 0656 12/20/16 0805 12/20/16 0858  GLUCAP 219* 187* 184* 154* 139*     No results found for this or any previous visit (from the past 240 hour(s)).    Radiology Studies: Dg Chest Port 1 View  Result Date: 12/19/2016 CLINICAL DATA:   Respiratory distress EXAM: PORTABLE CHEST 1 VIEW COMPARISON:  12/16/2016 FINDINGS: Support devices are unchanged. Heart is borderline enlarged. Left lower lobe atelectasis or infiltrate with small left effusion. This is similar to prior study. No focal opacity on the right. IMPRESSION: Stable left lower lobe atelectasis or infiltrate with small left effusion. Electronically Signed   By: Rolm Baptise M.D.   On: 12/19/2016 07:32   Dg Abd Portable 1v  Result Date: 12/20/2016 CLINICAL DATA:  Ileus EXAM: PORTABLE ABDOMEN - 1 VIEW COMPARISON:  To 09/2017 FINDINGS: Feeding tube tip in the duodenum near the ligament of  Treitz, unchanged. Small bowel dilatation has progressed. Mild small bowel distention. Gas in the colon without dilatation. NG tube in the stomach. IMPRESSION: Mildly dilated small bowel loops with progression since the prior study. Electronically Signed   By: Franchot Gallo M.D.   On: 12/20/2016 06:41   Dg Abd Portable 1v  Result Date: 12/19/2016 CLINICAL DATA:  Ileus EXAM: PORTABLE ABDOMEN - 1 VIEW COMPARISON:  12/18/2016 FINDINGS: NG tube is in the stomach. Feeding tube tip in the distal duodenum. Nonobstructive bowel gas pattern. Gas throughout nondistended large and small bowel. No free air organomegaly. IMPRESSION: Improved bowel distention.  Nonobstructive pattern currently. Feeding tube tip remains in the distal duodenum. Electronically Signed   By: Rolm Baptise M.D.   On: 12/19/2016 09:12     Medications:  Scheduled: . small volume/piggyback builder   Intravenous Q8H  . aspirin  325 mg Per Tube Daily  . chlorhexidine gluconate (MEDLINE KIT)  15 mL Mouth Rinse BID  . clonazePAM  2 mg Per Tube TID  . darunavir-cobicistat  1 tablet Oral Q breakfast  . dolutegravir  50 mg Oral Daily  . emtricitabine-tenofovir AF  1 tablet Per Tube Daily  . famotidine  20 mg Per Tube BID  . fentaNYL  50 mcg Transdermal Q72H  . heparin  5,000 Units Subcutaneous Q8H  . insulin aspart  0-15 Units  Subcutaneous Q4H  . insulin glargine  15 Units Subcutaneous BID  . lactulose  10 g Per Tube BID  . LORazepam  2 mg Intravenous Q6H  . mouth rinse  15 mL Mouth Rinse QID  . methylPREDNISolone (SOLU-MEDROL) injection  40 mg Intravenous Q8H  . sulfamethoxazole-trimethoprim  20 mL Per Tube Daily   Continuous: . feeding supplement (VITAL AF 1.2 CAL) 1,000 mL (12/20/16 0900)   ZDG:UYQIHK chloride, acetaminophen, chlorproMAZINE (THORAZINE) IV, docusate, fentaNYL (SUBLIMAZE) injection, haloperidol lactate, hydrALAZINE, ibuprofen, midazolam, sennosides, sodium chloride flush  Assessment/Plan:  Active Problems:   COPD (chronic obstructive pulmonary disease) (HCC)   Substance abuse   Genital warts   HIV (human immunodeficiency virus infection) ([place])   AIDS (acquired immune deficiency syndrome) ([place])   Noncompliance   Bacterial meningitis   Hypertensive urgency   Septic shock (HCC)   Transaminitis   Cocaine abuse   Open wound   Acute respiratory failure (HCC)   Altered mental status   Acute encephalopathy   Cerebral embolism with cerebral infarction   Endotracheally intubated   Meningoencephalitis   Neurosyphilis   Herpesviral meningitis   Paraplegia ([place])   Acute pulmonary edema (HCC)   Transverse myelitis ([place])   Encounter for orogastric (OG) tube placement   Somnolence   Abdominal distention   Ileus (HCC)   Muscle weakness (generalized)    Acute Hypoxemic Respiratory Failure  This was secondary to HCAP ESBL Klebsiella LLL+ sepsis + Unable to protect airway + pulm edema. Patient was on the ventilator for a prolonged duration. Subsequently underwent tracheostomy placement on 2/5. Pulmonology is following. Patient is off of the Precedex infusion. Weaning in process.  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 Infectious disease has been following. Patient is on acyclovir. He will need this for total of 21 days. Will complete  treatment on 2/17. Off of meropenem. Also on Bactrim for PCP prophylaxis.  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 Patient is on acyclovir. High-dose steroids, started 2/7 for 5 days. To be transitioned to a lower  dose steroids today which will be slowly tapered. Neurology has seen the patient. On fentanyl patch for pain issues. Started during this hospitalization.  Ileus Patient had NG tube placed 2/8. Vision has significantly improved. X-ray today did show some more distention on examination, his abdomen remained soft. He has good bowel sounds. Okay to reinitiate tube feedings today. Leave NG in however it will be clamped. Follow clinically. Continue with laxatives.   Hyperglycemia Unknown if patient has a history of diabetes. HbA1c 6.6 on 1/26. CBGs were extremely elevated due to steroids. He was initially placed on Lantus. However, CBGs were not adequately controlled. So, started on insulin infusion. Patient will be transitioned to Lantus insulin today, now that he is off of high-dose steroids. Diabetes coordinator is following.   Bradycardia Heart rate has improved. Bradycardia was most likely due to medication such as worsened. TSH is normal. Electrolytes were unremarkable. Continue to monitor.   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 On aspirin. Echocardiogram shows normal systolic function. LDL 93. PT and OT evaluation. No significant carotid stenosis noted on MRA neck. She was seen by neurology. They had mentioned that a TEE could be considered once the patient was more stable. This can be pursued at a later date, although embolic stroke appeared to be less of a possibility based on his clinical scenario.  Pulmonary edema Seems to have improved. Continues to be on IV Lasix, which will be continued for now.   Moderate to Severe Protein Calorie Malnutrition  Feedings on hold  due to ileus. Anticipate resuming 2/12.  Transaminitis Worsened by seroquel likely which was discontinued on 1/31.  Improved.   HCV ab+, Chronic hep b Management will be pursued as outpatient.  Normocytic anemia Hemoglobin stable. No evidence for overt bleeding. Continue to monitor.  LLL PNA with ESBL Klebsiella  Completed treatment. Monitor WBC.  HIV On antiretroviral treatment. Infectious disease was following.  Metabolic Encephalopathy Continue to monitor. Seems to be stable. PRN Sedative agents for agitation. Restraints in place to prevent him from pulling out supportive tubes.  History of Cocaine Abuse Will need counseling when able to communicate effectively   DVT Prophylaxis: Subcutaneous heparin    Code Status: Full code  Family Communication: No family at bedside  Disposition Plan: Management as outlined above. Will remain in step down unit.    LOS: 19 days   [facility] Pager [phone] 12/20/2016, 10:16 AM  If 7PM-7AM, please contact night-coverage at [email or web address], password [facility]

## 2016-12-20 NOTE — Progress Notes (Signed)
Sutures in place. No breakdown noted on at trach site. RT placed pt on 28% ATC with no complications.

## 2016-12-21 DIAGNOSIS — K567 Ileus, unspecified: Secondary | ICD-10-CM

## 2016-12-21 DIAGNOSIS — Z930 Tracheostomy status: Secondary | ICD-10-CM

## 2016-12-21 DIAGNOSIS — E870 Hyperosmolality and hypernatremia: Secondary | ICD-10-CM

## 2016-12-21 LAB — CBC
HEMATOCRIT: 33.1 % — AB (ref 39.0–52.0)
Hemoglobin: 10.1 g/dL — ABNORMAL LOW (ref 13.0–17.0)
MCH: 30 pg (ref 26.0–34.0)
MCHC: 30.5 g/dL (ref 30.0–36.0)
MCV: 98.2 fL (ref 78.0–100.0)
PLATELETS: 377 10*3/uL (ref 150–400)
RBC: 3.37 MIL/uL — ABNORMAL LOW (ref 4.22–5.81)
RDW: 17.1 % — AB (ref 11.5–15.5)
WBC: 12.5 10*3/uL — AB (ref 4.0–10.5)

## 2016-12-21 LAB — BASIC METABOLIC PANEL
ANION GAP: 9 (ref 5–15)
BUN: 41 mg/dL — ABNORMAL HIGH (ref 6–20)
CALCIUM: 9.2 mg/dL (ref 8.9–10.3)
CO2: 25 mmol/L (ref 22–32)
CREATININE: 0.9 mg/dL (ref 0.61–1.24)
Chloride: 118 mmol/L — ABNORMAL HIGH (ref 101–111)
Glucose, Bld: 226 mg/dL — ABNORMAL HIGH (ref 65–99)
Potassium: 4.2 mmol/L (ref 3.5–5.1)
SODIUM: 152 mmol/L — AB (ref 135–145)

## 2016-12-21 LAB — GLUCOSE, CAPILLARY
GLUCOSE-CAPILLARY: 213 mg/dL — AB (ref 65–99)
GLUCOSE-CAPILLARY: 206 mg/dL — AB (ref 65–99)
GLUCOSE-CAPILLARY: 187 mg/dL — AB (ref 65–99)
GLUCOSE-CAPILLARY: 191 mg/dL — AB (ref 65–99)
Glucose-Capillary: 216 mg/dL — ABNORMAL HIGH (ref 65–99)
Glucose-Capillary: 233 mg/dL — ABNORMAL HIGH (ref 65–99)

## 2016-12-21 MED ORDER — VITAL AF 1.2 CAL PO LIQD
1000.0000 mL | ORAL | Status: DC
  Administered 2016-12-21: 1000 mL

## 2016-12-21 MED ORDER — FREE WATER
200.0000 mL | Freq: Four times a day (QID) | Status: DC
  Administered 2016-12-21 – 2016-12-23 (×4): 200 mL

## 2016-12-21 MED ORDER — INSULIN GLARGINE 100 UNIT/ML ~~LOC~~ SOLN
18.0000 [IU] | Freq: Two times a day (BID) | SUBCUTANEOUS | Status: DC
  Administered 2016-12-21 (×2): 18 [IU] via SUBCUTANEOUS
  Filled 2016-12-21 (×3): qty 0.18

## 2016-12-21 NOTE — Progress Notes (Signed)
PULMONARY / CRITICAL CARE MEDICINE   Name: Lawrence Kane MRN: [id number] DOB: 1966-08-31    ADMISSION DATE:  12/01/2016 CONSULTATION DATE:  12/01/2016  REFERRING MD:  [name] transfer  CHIEF COMPLAINT:  AMS  BRIEF SUMMARY:   51 year old man with HIV/AIDS (first diagnosed 2003, last CD4 count 245 with viremia of 1,160 in October 2017; on Genovya, Darunavir and Bactrim), Genital Warts, COPD, and cocaine substance abuse who presented to [facility] on 1/24 with complaint of altered mental status. Per notes, patient may have recently had some type of surgery in [place] where his mother resides. Afebrile, tachycardic to 155, RR 18, hypertensive to 240/112. Patient was intubated for airway protection, CVL in IJ was placed, and LP was performed given his altered mental status. He was started on Versed and propofol for sedation. Initial labs showed UA negative for infection. UDS positive for cocaine. CSF appeared yellow, cloudy, xanthochromic, WBC 4845, RBC 625, glucose <20, total protein >300, Neutrophils 16%, Eos 64%, monocytes 17%, lymphocytes 3%. Gram stain performed, but unable to view results. ID was consulted. Dr. Baxter Flattery recommended continuing Truvada and Tivicay through NGT.  Prolonged respiratory failure s/p trach (2/5).  Weaned to ATC 2/6.    SUBJECTIVE:  RN reports no acute events overnight, remains on ATC.  Pt denies SOB/chest pain, abd pain.     VITAL SIGNS: BP (!) 143/105   Pulse 65   Temp 98.2 F (36.8 C) (Oral)   Resp (!) 23   Ht _0  (1.93 m)   Wt 210 lb 5.1 oz (95.4 kg)   SpO2 98%   BMI 25.60 kg/m   VENTILATOR SETTINGS: Vent Mode: PRVC FiO2 (%):  [40 %-60 %] 50 % Set Rate:  [16 bmp] 16 bmp Vt Set:  [620 mL] 620 mL PEEP:  [5 cmH20] 5 cmH20 Pressure Support:  [5 cmH20] 5 cmH20 Plateau Pressure:  [17 cmH20-24 cmH20] 19 cmH20  INTAKE / OUTPUT: I/O last 3 completed shifts: In: 2596.1 [P.O.:3; I.V.:770.8; NG/GT:1606.3; IV Piggyback:216] Out: 2815 [Urine:2590;  Emesis/NG output:225]  PHYSICAL EXAMINATION: General: chronically ill appearing M in NAD on ATC HEENT: MM pink/moist, trach midline c/d/i, creamy white secretions noted PSY: calm Neuro: Awake, alert, follows commands, moves UE, no follow command on LE's CV: s1s2 rrr, no m/r/g PULM: even/non-labored, lungs bilaterally with scattered rhonchi  NW:GNFA, non-tender, bsx4 active  Extremities: warm/dry, no edema  Skin: no rashes or lesions   LABS:  BMET  Recent Labs Lab 12/13/16 0219 12/15/16 0439 12/16/16 0239  NA 136 138 138  K 4.1 3.5 4.0  CL 97* 97* 102  CO2 35* 30 22  BUN 24* 28* 42*  CREATININE 0.82 0.79 0.93  GLUCOSE 223* 219* 334*    Electrolytes  Recent Labs Lab 12/11/16 0330 12/12/16 0319 12/13/16 0219 12/15/16 0439 12/16/16 0239  CALCIUM 8.2* 8.1* 8.4* 9.1 9.5  MG 1.9 1.7 2.0  --   --   PHOS 3.6 3.2 2.6 3.8  --     CBC  Recent Labs Lab 12/13/16 0219 12/15/16 0430 12/16/16 0239  WBC 10.5 11.9* 8.8  HGB 8.4* 9.3* 9.7*  HCT 26.7* 29.6* 31.7*  PLT 321 396 443*    Coag's  Recent Labs Lab 12/13/16 1433  APTT 35  INR 0.99    Sepsis Markers No results for input(s): LATICACIDVEN, PROCALCITON, O2SATVEN in the last 168 hours.  ABG No results for input(s): PHART, PCO2ART, PO2ART in the last 168 hours.  Liver Enzymes  Recent Labs Lab 12/11/16 0330 12/12/16 0319  12/13/16 0219 12/15/16 0439  AST 322* 197* 126*  --   ALT 391* 350* 248*  --   ALKPHOS 94 101 93  --   BILITOT 0.9 0.9 0.7  --   ALBUMIN 1.6* 1.8* 1.7* 1.9*    Cardiac Enzymes No results for input(s): TROPONINI, PROBNP in the last 168 hours.  Glucose  Recent Labs Lab 12/17/16 0226 12/17/16 0329 12/17/16 0426 12/17/16 0535 12/17/16 0643 12/17/16 0813  GLUCAP 142* 135* 149* 144* 153* 134*    Imaging Ct Abdomen Pelvis Wo Contrast  Result Date: 12/17/2016 CLINICAL DATA:  Bowel obstruction EXAM: CT ABDOMEN AND PELVIS WITHOUT CONTRAST TECHNIQUE: Multidetector CT  imaging of the abdomen and pelvis was performed following the standard protocol without IV contrast. COMPARISON:  12/16/2016, CT 11/29/2016 FINDINGS: Lower chest: Atelectasis or partial consolidation in the right lower lobe. Dense partial left lower lobe consolidation concerning for a pneumonia. Normal heart size. Hepatobiliary: No focal hepatic abnormality. The gallbladder is enlarged up to 5.1 cm. No calcified stones. No wall thickening. No biliary dilatation. Pancreas: Unremarkable. No pancreatic ductal dilatation or surrounding inflammatory changes. Spleen: Normal in size without focal abnormality. Adrenals/Urinary Tract: Adrenal glands are unremarkable. Kidneys are normal, without renal calculi, focal lesion, or hydronephrosis. Bladder contains a Foley catheter Stomach/Bowel: Esophageal tube is present in the stomach and the tip terminates in the proximal duodenum. There are borderline to slightly dilated fluid-filled loops of small bowel throughout the abdomen. There is enlarged fluid filled right colon. No wall thickening. The appendix is not well identified. Vascular/Lymphatic: Non aneurysmal aorta. No grossly enlarged lymph nodes. Reproductive: No masses. Other: No free air.  No free fluid. Musculoskeletal: No acute or suspicious bone lesions IMPRESSION: 1. Borderline to mildly enlarged loops of small bowel throughout the abdomen with dilated fluid-filled right colon and transverse colon, findings are suggestive of an ileus. Partial bowel obstruction included in the differential but felt less likely. 2. Partial consolidation in the left lower lobe concerning for pneumonia. Electronically Signed   By: Donavan Foil M.D.   On: 12/17/2016 01:36   Dg Abd Portable 1v  Result Date: 12/16/2016 CLINICAL DATA:  Assess nasogastric tube positioning. EXAM: PORTABLE ABDOMEN - 1 VIEW COMPARISON:  KUB of December 16, 2016 at 9:22 a.m. FINDINGS: The radiodense tipped feeding tube tip projects below the inferior margin of  the image. The orogastric tube has its proximal port in the gastric cardia with the tip re-curved upon itself pointing toward the GE junction. There are loops of moderately distended gas-filled small and large bowel present. There is dense atelectasis or pneumonia in the left lower lobe. IMPRESSION: The tip of the feeding tube projects below the inferior margin of the image. Both the proximal port and the tip of the orogastric tube lie in the region of the gastric cardia but the tip of the tube is directed back toward the GE junction. Electronically Signed   By: David  Martinique M.D.   On: 12/16/2016 15:58   Dg Abd Portable 1v  Result Date: 12/16/2016 CLINICAL DATA:  Abdominal distention EXAM: PORTABLE ABDOMEN - 1 VIEW COMPARISON:  December 14, 2016 FINDINGS: Feeding tube tip is in the third portion of the duodenum, unchanged. There are multiple loops of dilated bowel without bowel wall thickening. No free air evident. IMPRESSION: Bowel gas pattern is concerning for ileus or possibly early bowel obstruction. No demonstrable free air. Feeding tube tip in third portion duodenum. Electronically Signed   By: Lowella Grip III M.D.   On:  12/16/2016 09:37    STUDIES:  1/24 CXR >> ATX vs LLL infiltrate 1/25 CT head >> neg for acute intracranial process 1/25 LP cytology >> no malignant cells 1/25 LP >> yellow, cloudy, glucose 20, total protein >600, RBC 114>49, WBC 970, 36% neut, 20% lymphs, 19% eos 1/28 BAL cell count >> 266 WBC, eos 1 1/28 BAL cytology >> no malignant cells 1/27 BAL for wet mount> neg for parasites 1/27 MRI brain >> small acute infarcts in cerebellum and cerebral cortex, sucal signal 1/29 LP >> Glucose 82, protein >600, WBC 600>885, eosinophils 28% 1/29 Echo >> LV EF 60-65%, indeterminent diastolic function, wall motion normal 1/30 EEG >> diffuse slowing and background suppression, no seizures 2/3 MRI thoracic/lumbar/cervical >> (-) abscess. Meningitis. R/O CMV 2/8 abd/pevlis CT >>  borderline to mildly enlarged loops of small bowel throughout the abd with dilated fluid filled R colon and transverses colon, findings are suggestive of an ileus. Partial bowel obstruction included in the differential, but felt less likely. Partial consolidation in the LLL concerning for PNA.    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 1/24 Cryptococcal [name]): negative 1/24 CSF [facility]): NGTD 1/22 scrotal wound cx [name]): MRSA Toxoplasma [name]): Negative 1/25 BCx x2 : NGTD 1/25 HCV ab + 1/24 Trach asp 1/24: NGTD 1/24 MRSA PCR: MRSA pos 1/25 HIV quant: 80, CD4 10 1/25 CSF AFB: Smear neg, >> 1/25 CSF fungal cx: NGTD 1/25 CSF crypto ag: neg 1/25 CSF gram stain no organisms, cx: NGTD 1/27 strongyloides ab >> negative 1/27 coccidiomycosis ab >> 1/28 BAL AFB smear/cx> 1/28 BAL Fungus cx >> 1/28 BAL gram stain/Cx: 40K ESBL Klebsiella pneumoniae 1/28 BAL pneumocystis smear: Neg 1/29 CSF Cx >> no organisms on gram stain, >>  2/04 CSF CMV >> not done?  ANTIBIOTICS: Rocephin 1/24>>1/30 Vancomycin 1/24>>1/30; 2/2 > 2/05 Acyclovir 1/24>> 1/26, 1/27>> Ampicillin 1/24>> 1/27 Bactrim M/W/F for PCP prophylaxis Meropenem 1/30>> 2/1; 2/2 > 2/6 Pen G 2/1 >> 2/2 Cipro 2/1 >> 2/2  SIGNIFICANT EVENTS: 1/24  Transferred from [facility] 1/25  Underwent LP here 2/05  Trach 2/06  To ATC 2/08  abd distention, likely ileus 2/12  Abd improved, tol TF, weaning on ATC  LINES/TUBES: L IJ CVL 1/24 >> ETT 1/24 >> 2/5 Foley 1/24 >> OGT 1/24 >> 2/5 Trach (df) 2/5 >> NGT to suction 2/08  DISCUSSION: 51 year old man with HIV/AIDS (first diagnosed 2003, last CD4 count 245 with viremia of 1,160 in October 2017; on Genovya, Darunavir and Bactrim), Genital Warts, COPD, and cocaine substance abuse who presented to [facility] on 1/24 with altered mental status. Intubated for airway protection. Prolonged vent wean due to ESBL klebsiella HCAP with  sepsis > trach 2/5.    ASSESSMENT / PLAN:  PULMONARY A: Acute Hypoxemic Respiratory Failure - HCAP ESBL Klebsiella LLL+ sepsis + Unable to protect airway + pulm edema Tracheostomy Status - 2/5 P:   ATC as tolerated  Wean O2 for sats > 90% Pulmonary hygiene as able - mobilize, upright positioning  Follow intermittent CXR Clip trach sutures 2/12  Monitor abdominal exam   CARDIOVASCULAR A:  Sinus Tachycardia - resolved Incomplete RBBB HTN Pulm edema P:  SDU status  TRH Primary SVC  PRN hydralazine ASA  Defer diuresis to primary   RENAL A:   At Risk AKI - in setting of critical illness, diuresis Hypernatremia P:   Trend BMP / UOP  Replace electrolytes as indicated Free water 200 ml Q8  GASTROINTESTINAL A:  Moderate to Severe Protein Calorie Malnutrition  Transaminitis, (worsened by seroquel likely which was discontinued on 1/31).  Improved.  HCV ab+, Chronic hep b Abdominal Distention - bowel ileus vs partial SBO Loose stools = ileus , less likely obstruction withoutput P:   NPO  Defer ileus mgmt to primary  Continue Pepcid Defer lactulose / colace to primary (initially for constipation) TF per primary  HEMATOLOGIC A:   Mild leukocytosis > Resolved Mild anemia P:  Heparin for DVT prophylaxis   INFECTIOUS A:   Meningitis, bacterial + viral. 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 P:   Neurology following > rec's for repeat MRI to follow up weakness  Consider thoracic / lumbar MRI with LE weakness Continue acyclovir Continue klonopin, fentanyl  Will need slow wean off above  Steroid taper began 2/12   GLOBAL:  TRH primary, PCCM following for trach care.  Clip trach sutures 2/6.    51 year old male with HIV/AIDS and substance abuse who was intubated for airway protection and then needed a tracheostomy.  On ATC with coarse BS on exam.  I reviewed CXR myself, trach in good position.  Discussed with TRH-MD and PCCM-NP.  Respiratory faiulre:  - Monitor for airway protection  - Maintain trach as is  - May change to cuffless next week  Hypoxemia:  - Titrate O2 for sat of 88-92%  Trach status:  - Remove sutures.  - May consider changing to a cuffless trach towards the end of the week.  PCCM will continue to follow.  Patient seen and examined, agree with above note.  I dictated the care and orders written for this patient under my direction.  Rush Farmer, MD [phone]

## 2016-12-21 NOTE — Care Management Note (Signed)
Case Management Note  Patient Details  Name: Patrick NorthDarren E Focht MRN: [id number] Date of Birth: [birth year]  Subjective/Objective:     Pt admitted with bacterial menegitis               Action/Plan: PTA from home - pt remains ventilated. Bacterial meningitis, possible parasitic given elevated eosinophils, also would consider coccidiodes. Possible LLL PNA, HIV > CD4 10  . Plan is to keep pt on full support until Monday 12/06/16 to allow for possible recovery - tentative GOC meeting scheduled Monday.  Mom and sister are POC.  CM will continue to follow for discharge needs   Expected Discharge Date:                  Expected Discharge Plan:     In-House Referral:  Clinical Social Work  Discharge planning Services  CM Consult  Post Acute Care Choice:    Choice offered to:     DME Arranged:    DME Agency:     HH Arranged:    HH Agency:     Status of Service:  In process, will continue to follow  If discussed at Long Length of Stay Meetings, dates discussed:    Additional Comments: 12/21/16 Discussed in LOS 12/21/16 - pt remains appropriate for continued stay.  Pt deemed appropriate for CIR consult - consult ordered.  CSW following for SNF.  10/19/17 Pt is now on TC - will consult with CIR once pt has been on TC for at least 24 hours.  12/26/16 Pt is not eligible at this time for CIR consideration due to ventilator  12/14/16 Discussed in LOS 12/14/16 - pt remains appropriate for continued stay.  Pt was trached yesterday - remains ventilated via trach.  Discharge more probable SNF as pt is not eligible for LTACH due to insurance coverage and lack of support in the home.  Pts support system includes mom and sister.  CSW consulted for potential discharge to SNF [name], Stephanne Greeley S, RN 12/21/2016, 3:10 PM

## 2016-12-21 NOTE — Progress Notes (Signed)
[facility] PROGRESS NOTE  Lawrence Kane:[id number] DOB: 03-15-1966 DOA: 12/01/2016  PCP: No primary care provider on file.  Brief History/Interval Summary: 51 year old man with HIV/AIDS (first diagnosed 2003, last CD4 count 245 with viremia of 1,160 in October 2017; on Genovya, Darunavir and Bactrim), Genital Warts, COPD, and cocaine substance abuse who presented to [facility] on 1/24 with complaint of altered mental status. Per notes, patient may have recently had some type of surgery in [place] where his mother resides. Afebrile, tachycardic to 155, RR 18, hypertensive to 240/112. Patient was intubated for airway protection, CVL in IJ was placed, and LP was performed given his altered mental status. ID was consulted. Patient was placed on antibiotics. He subsequently had to undergo tracheostomy. Subsequently, patient developed an ileus. NG tube was placed to intermittent suction. He has improved.  Reason for Visit: Meningitis. Transverse myelitis. Ileus.  Consultants: Infectious disease. Critical care medicine. Neurology.  STUDIES:  1/24 CXR >> ATX vs LLL infiltrate 1/25 CT head >> neg for acute intracranial process 1/25 LP cytology >> no malignant cells 1/25 LP >> yellow, cloudy, glucose 20, total protein >600, RBC 114>49, WBC 970, 36% neutrophil, 20% lymphs, 19% eos 1/28 BAL cell count >> 266 WBC, eos 1 1/28 BAL cytology >> no malignant cells 1/27 BAL for wet mount> neg for parasites 1/27 MRI brain >> small acute infarcts in cerebellum and cerebral cortex, sucal signal 1/29 LP >> Glucose 82, protein >600, WBC 600>885, eosinophils 28% 1/29 Echo >> LV EF 60-65%, indeterminent diastolic function, wall motion normal 1/30 EEG >> diffuse slowing and background suppression, no seizures 2/3 MRI thoracic/lumbar/cervical >> (-) abscess. Meningitis. R/O CMV   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 1/24 Cryptococcal [name]):  negative 1/24 CSF [facility]): NGTD 1/22 scrotal wound cx [name]): MRSA Toxoplasma [name]): Negative 1/25BCx x2 : NGTD 1/25 HCV ab + 1/24 Trach asp 1/24: NGTD 1/24MRSA PCR: MRSA pos 1/25 HIV quant: 80, CD4 10 1/25 CSF AFB: Smear neg, >> 1/25 CSF fungal cx: NGTD 1/25 CSF crypto ag: neg 1/25 CSF gram stain no organisms, cx: NGTD 1/27 strongyloides ab >> negative 1/27 coccidiomycosis ab >> 1/28 BAL AFB smear/cx> 1/28 BAL Fungus cx >> 1/28 BAL gram stain/Cx: 40K ESBL Klebsiella pneumoniae 1/28 BAL pneumocystis smear: Neg 1/27 O&P >> 1/29 CSF Cx >> no organisms on gram stain, >> NGTD 2/04 CSF CMV >> not done?   ANTIBIOTICS: Rocephin 1/24>>1/30 Vancomycin 1/24>>1/30; 2/2 >  Acyclovir 1/24>> 1/26, 1/27>> Ampicillin 1/24>> 1/27 Bactrim M/W/F for PCP prophylaxis Meropenem 1/30>> 2/1; 2/2 >  Pen G 2/1 >> 2/2 Cipro 2/1 >> 2/2  SIGNIFICANT EVENTS: 1/24  Transferred from [facility] 1/25  Underwent LP here 2/05  Trach 2/06  To ATC 2/8  patient developed ileus  LINES/TUBES: L IJ CVL 1/24 >> ETT 1/24 >> 2/5 Foley 1/24 >> OGT 1/24 >> 2/5 Trach (df) 2/5 >>   Subjective/Interval History: Patient is trying to communicate but unable to do so due to tracheostomy. Does not appear to be in any discomfort.  ROS: Unable to do.  Objective:  Vital Signs  Vitals:   12/21/16 0332 12/21/16 0400 12/21/16 0500 12/21/16 0600  BP:  (!) 147/96 (!) 153/95 (!) 141/91  Pulse: (!) 59 (!) 58 (!) 56 62  Resp: _0 Temp:  97.9 F (36.6 C)    TempSrc:  Oral    SpO2: 99% 99% 98% 99%  Weight:   92.4 kg (203  lb 11.3 oz)   Height:        Intake/Output Summary (Last 24 hours) at 12/21/16 0738 Last data filed at 12/21/16 0600  Gross per 24 hour  Intake          1207.69 ml  Output             1180 ml  Net            27.69 ml   Filed Weights   12/19/16 0500 12/20/16 0500 12/21/16 0500  Weight: 91.8 kg (202 lb 6.1 oz) 91.4 kg (201 lb 8 oz) 92.4 kg (203 lb 11.3 oz)     General appearance: Patient is awake and alert. Neck: Tracheostomy noted Resp: Good air entry bilaterally. No wheezing, rales or rhonchi. diminished at the bases.  Cardio: S1, S2, is bradycardic, regular. No S3, S4. No rubs, murmurs, or bruit. Minimal pedal edema.  GI: Abdomen remains soft. Not distended. Bowel sounds are present. No masses or organomegaly. Nontender.  Extremities: Minimal edema noted bilateral lower extremities Pulses: 2+ and symmetric Neurologic: Patient is awake and alert. Moving his upper extremities. No movement noted in the lower extremities.  Lab Results:  Data Reviewed: I have personally reviewed following labs and imaging studies  CBC:  Recent Labs Lab 12/15/16 0430 12/16/16 0239 12/18/16 0328 12/19/16 0910 12/21/16 0233  WBC 11.9* 8.8 8.4 9.2 12.5*  HGB 9.3* 9.7* 9.7* 9.4* 10.1*  HCT 29.6* 31.7* 31.1* 30.9* 33.1*  MCV 97.4 96.9 96.9 97.8 98.2  PLT 396 443* 444* 396 016    Basic Metabolic Panel:  Recent Labs Lab 12/15/16 0439  12/17/16 0900 12/18/16 0328 12/19/16 0910 12/20/16 0926 12/21/16 0233  NA 138  < > 145 145 149* 151* 152*  K 3.5  < > 4.0 3.6 3.5 4.0 4.2  CL 97*  < > 107 107 113* 119* 118*  CO2 30  < > _0 GLUCOSE 219*  < > 140* 192* 217* 157* 226*  BUN 28*  < > 55* 49* 51* 44* 41*  CREATININE 0.79  < > 1.08 1.03 1.16 0.91 0.90  CALCIUM 9.1  < > 9.4 9.2 9.3 9.4 9.2  MG  --   --   --   --  2.5*  --   --   PHOS 3.8  --   --   --   --   --   --   < > = values in this interval not displayed.  GFR: Estimated Creatinine Clearance: 119.2 mL/min (by C-G formula based on SCr of 0.9 mg/dL).  Liver Function Tests:  Recent Labs Lab 12/15/16 0439 12/19/16 0910  AST  --  40  ALT  --  88*  ALKPHOS  --  61  BILITOT  --  0.5  PROT  --  7.9  ALBUMIN 1.9* 2.0*     Coagulation Profile: No results for input(s): INR, PROTIME in the last 168 hours.  CBG:  Recent Labs Lab 12/20/16 1101 12/20/16 1501  12/20/16 1950 12/20/16 2309 12/21/16 0331  GLUCAP 158* 188* 203* 234* 213*     No results found for this or any previous visit (from the past 240 hour(s)).    Radiology Studies: Dg Abd Portable 1v  Result Date: 12/20/2016 CLINICAL DATA:  Ileus EXAM: PORTABLE ABDOMEN - 1 VIEW COMPARISON:  To 09/2017 FINDINGS: Feeding tube tip in the duodenum near the ligament of Treitz, unchanged. Small bowel dilatation has progressed. Mild small bowel distention. Gas in  the colon without dilatation. NG tube in the stomach. IMPRESSION: Mildly dilated small bowel loops with progression since the prior study. Electronically Signed   By: Franchot Gallo M.D.   On: 12/20/2016 06:41   Dg Abd Portable 1v  Result Date: 12/19/2016 CLINICAL DATA:  Ileus EXAM: PORTABLE ABDOMEN - 1 VIEW COMPARISON:  12/18/2016 FINDINGS: NG tube is in the stomach. Feeding tube tip in the distal duodenum. Nonobstructive bowel gas pattern. Gas throughout nondistended large and small bowel. No free air organomegaly. IMPRESSION: Improved bowel distention.  Nonobstructive pattern currently. Feeding tube tip remains in the distal duodenum. Electronically Signed   By: Rolm Baptise M.D.   On: 12/19/2016 09:12     Medications:  Scheduled: . small volume/piggyback builder   Intravenous Q8H  . aspirin  325 mg Per Tube Daily  . chlorhexidine gluconate (MEDLINE KIT)  15 mL Mouth Rinse BID  . clonazePAM  2 mg Per Tube TID  . darunavir-cobicistat  1 tablet Oral Q breakfast  . dolutegravir  50 mg Oral Daily  . emtricitabine-tenofovir AF  1 tablet Per Tube Daily  . famotidine  20 mg Per Tube BID  . fentaNYL  50 mcg Transdermal Q72H  . free water  200 mL Per Tube Q8H  . heparin  5,000 Units Subcutaneous Q8H  . insulin aspart  0-15 Units Subcutaneous Q4H  . insulin glargine  15 Units Subcutaneous BID  . lactulose  10 g Per Tube BID  . LORazepam  2 mg Intravenous Q6H  . mouth rinse  15 mL Mouth Rinse QID  . methylPREDNISolone (SOLU-MEDROL)  injection  40 mg Intravenous Q8H  . sulfamethoxazole-trimethoprim  20 mL Per Tube Daily   Continuous: . feeding supplement (VITAL AF 1.2 CAL)     WFU:XNATFT chloride, acetaminophen, chlorproMAZINE (THORAZINE) IV, docusate, fentaNYL (SUBLIMAZE) injection, haloperidol lactate, hydrALAZINE, ibuprofen, sennosides  Assessment/Plan:  Active Problems:   COPD (chronic obstructive pulmonary disease) (HCC)   Substance abuse   Genital warts   HIV (human immunodeficiency virus infection) ([place])   AIDS (acquired immune deficiency syndrome) ([place])   Noncompliance   Bacterial meningitis   Hypertensive urgency   Septic shock (HCC)   Transaminitis   Cocaine abuse   Open wound   Acute respiratory failure (HCC)   Altered mental status   Acute encephalopathy   Cerebral embolism with cerebral infarction   Endotracheally intubated   Meningoencephalitis   Neurosyphilis   Herpesviral meningitis   Paraplegia ([place])   Acute pulmonary edema (HCC)   Transverse myelitis ([place])   Encounter for orogastric (OG) tube placement   Somnolence   Abdominal distention   Ileus (HCC)   Muscle weakness (generalized)   Acute respiratory failure with hypoxia (HCC)   Tracheostomy status (HCC)   Hypoxemia    Acute Hypoxemic Respiratory Failure  This was secondary to HCAP ESBL Klebsiella LLL+ sepsis + Unable to protect airway + pulm edema. Patient was on the ventilator for a prolonged duration. Subsequently underwent tracheostomy placement on 2/5. Pulmonology is following. Patient is off of the Precedex infusion. Weaning in process. Was on trach collar all day yesterday and tolerated it well.  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 Infectious disease has been following. Patient is on acyclovir. He will need this for total of 21 days. Will complete treatment on 2/17. Off of meropenem. Also on Bactrim for PCP prophylaxis.  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 Patient is on acyclovir. High-dose steroids, given for 5 days. Now on low-dose steroid. This will need to be slowly tapered down. Neurology has seen the patient. On fentanyl patch for pain issues. Started during this hospitalization.  Ileus Improved. Patient had NG tube placed 2/8. Tolerating tube feeding. Okay to take about NG tube. Increase rate of tube feeding. Continue laxatives.   Hyperglycemia Unknown if patient has a history of diabetes. HbA1c 6.6 on 1/26. CBGs were extremely elevated due to steroids. He was initially placed on Lantus. However, CBGs were not adequately controlled. So, started on insulin infusion. Patient transitioned to Lantus on 2/12. We'll increase the dose to achieve better glycemic control.   Bradycardia Heart rate has improved. Bradycardia was most likely due to medication such as versed. TSH is normal. Continue to monitor.   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 On aspirin. Echocardiogram shows normal systolic function. LDL 93. PT and OT evaluation. No significant carotid stenosis noted on MRA neck. She was seen by neurology. They had mentioned that a TEE could be considered once the patient was more stable. This can be pursued at a later date, although embolic stroke appeared to be less of a possibility based on his clinical scenario.  Pulmonary edema Seems to have improved. Continues to be on IV Lasix, which will be continued for now.   Moderate to Severe Protein Calorie Malnutrition  Feedings on hold due to ileus. Anticipate resuming 2/12.  Hypernatremia Free water to continue. Increase to every 6 hours. Repeat labs tomorrow. Tube feedings reinitiated. Sodium level should come down.  Transaminitis Worsened by seroquel likely which was discontinued on 1/31.  Improved.   HCV ab+, Chronic hep b Management will be pursued as outpatient.  Normocytic  anemia Hemoglobin stable. No evidence for overt bleeding. Continue to monitor.  LLL PNA with ESBL Klebsiella  Completed treatment. Monitor WBC.  HIV On antiretroviral treatment. Infectious disease was following.  Metabolic Encephalopathy Continue to monitor. Seems to be stable. PRN Sedative agents for agitation. Restraints in place to prevent him from pulling out supportive tubes.  History of Cocaine Abuse Will need counseling when able to communicate effectively  DVT Prophylaxis: Subcutaneous heparin    Code Status: Full code  Family Communication: No family at bedside  Disposition Plan: Management as outlined above. Will remain in step down unit.    LOS: 20 days   [facility] Pager [phone] 12/21/2016, 7:38 AM  If 7PM-7AM, please contact night-coverage at [email or web address], password [facility]

## 2016-12-22 ENCOUNTER — Inpatient Hospital Stay (HOSPITAL_COMMUNITY): Payer: Medicaid Other

## 2016-12-22 DIAGNOSIS — A419 Sepsis, unspecified organism: Principal | ICD-10-CM

## 2016-12-22 DIAGNOSIS — D62 Acute posthemorrhagic anemia: Secondary | ICD-10-CM

## 2016-12-22 DIAGNOSIS — R0603 Acute respiratory distress: Secondary | ICD-10-CM

## 2016-12-22 DIAGNOSIS — E870 Hyperosmolality and hypernatremia: Secondary | ICD-10-CM

## 2016-12-22 DIAGNOSIS — D72829 Elevated white blood cell count, unspecified: Secondary | ICD-10-CM

## 2016-12-22 DIAGNOSIS — G0490 Encephalitis and encephalomyelitis, unspecified: Secondary | ICD-10-CM

## 2016-12-22 DIAGNOSIS — R6521 Severe sepsis with septic shock: Secondary | ICD-10-CM

## 2016-12-22 DIAGNOSIS — R0600 Dyspnea, unspecified: Secondary | ICD-10-CM

## 2016-12-22 DIAGNOSIS — J432 Centrilobular emphysema: Secondary | ICD-10-CM

## 2016-12-22 DIAGNOSIS — R52 Pain, unspecified: Secondary | ICD-10-CM

## 2016-12-22 DIAGNOSIS — R451 Restlessness and agitation: Secondary | ICD-10-CM

## 2016-12-22 LAB — CBC
HCT: 34.2 % — ABNORMAL LOW (ref 39.0–52.0)
Hemoglobin: 10.4 g/dL — ABNORMAL LOW (ref 13.0–17.0)
MCH: 29.8 pg (ref 26.0–34.0)
MCHC: 30.4 g/dL (ref 30.0–36.0)
MCV: 98 fL (ref 78.0–100.0)
PLATELETS: 384 10*3/uL (ref 150–400)
RBC: 3.49 MIL/uL — ABNORMAL LOW (ref 4.22–5.81)
RDW: 17.4 % — AB (ref 11.5–15.5)
WBC: 11.2 10*3/uL — ABNORMAL HIGH (ref 4.0–10.5)

## 2016-12-22 LAB — BASIC METABOLIC PANEL
Anion gap: 9 (ref 5–15)
BUN: 36 mg/dL — AB (ref 6–20)
CALCIUM: 9.3 mg/dL (ref 8.9–10.3)
CO2: 25 mmol/L (ref 22–32)
Chloride: 116 mmol/L — ABNORMAL HIGH (ref 101–111)
Creatinine, Ser: 0.78 mg/dL (ref 0.61–1.24)
GFR calc Af Amer: >60 mL/min (ref >60)
GLUCOSE: 201 mg/dL — AB (ref 65–99)
Potassium: 4.6 mmol/L (ref 3.5–5.1)
Sodium: 150 mmol/L — ABNORMAL HIGH (ref 135–145)

## 2016-12-22 LAB — GLUCOSE, CAPILLARY
GLUCOSE-CAPILLARY: 176 mg/dL — AB (ref 65–99)
Glucose-Capillary: 179 mg/dL — ABNORMAL HIGH (ref 65–99)
Glucose-Capillary: 170 mg/dL — ABNORMAL HIGH (ref 65–99)
Glucose-Capillary: 130 mg/dL — ABNORMAL HIGH (ref 65–99)
Glucose-Capillary: 240 mg/dL — ABNORMAL HIGH (ref 65–99)

## 2016-12-22 MED ORDER — INSULIN GLARGINE 100 UNIT/ML ~~LOC~~ SOLN
20.0000 [IU] | Freq: Two times a day (BID) | SUBCUTANEOUS | Status: DC
  Administered 2016-12-23 (×2): 20 [IU] via SUBCUTANEOUS
  Filled 2016-12-22 (×3): qty 0.2

## 2016-12-22 MED ORDER — METHYLPREDNISOLONE SODIUM SUCC 40 MG IJ SOLR
20.0000 mg | INTRAMUSCULAR | Status: AC
  Administered 2016-12-29: 20 mg via INTRAVENOUS
  Filled 2016-12-22: qty 1

## 2016-12-22 MED ORDER — JEVITY 1.2 CAL PO LIQD
1000.0000 mL | ORAL | Status: DC
  Administered 2016-12-22: 1000 mL
  Filled 2016-12-22 (×26): qty 1000

## 2016-12-22 MED ORDER — METHYLPREDNISOLONE SODIUM SUCC 40 MG IJ SOLR
40.0000 mg | Freq: Two times a day (BID) | INTRAMUSCULAR | Status: AC
  Administered 2016-12-22 – 2016-12-24 (×4): 40 mg via INTRAVENOUS
  Filled 2016-12-22 (×4): qty 1

## 2016-12-22 MED ORDER — PRO-STAT SUGAR FREE PO LIQD
30.0000 mL | Freq: Two times a day (BID) | ORAL | Status: DC
  Administered 2016-12-22: 30 mL
  Filled 2016-12-22 (×5): qty 30

## 2016-12-22 MED ORDER — METHYLPREDNISOLONE SODIUM SUCC 40 MG IJ SOLR
20.0000 mg | INTRAMUSCULAR | Status: AC
  Administered 2016-12-26 – 2016-12-27 (×2): 20 mg via INTRAVENOUS
  Filled 2016-12-22 (×2): qty 1

## 2016-12-22 MED ORDER — METHYLPREDNISOLONE SODIUM SUCC 40 MG IJ SOLR
20.0000 mg | Freq: Two times a day (BID) | INTRAMUSCULAR | Status: AC
  Administered 2016-12-24 – 2016-12-25 (×2): 20 mg via INTRAVENOUS
  Filled 2016-12-22: qty 1

## 2016-12-22 NOTE — Progress Notes (Signed)
CSW engaged with [name] via T/C to update her on Patient's progression. PT is now recommending CIR. CSW explained the process of SNF backup in the event that Patient is unable to go to CIR. Patient's family agreeable to CIR w/ SNF backup. CSW has updated FL-2 to reflect trach collar and has made referral to local Trach SNF's    Lawrence Kane, MSW, LCSW [facility]M Clinical Social Worker [phone]

## 2016-12-22 NOTE — Progress Notes (Signed)
Nutrition Follow-up   DOCUMENTATION CODES:   Not applicable  INTERVENTION:    Jevity 1.2 at 75 ml/h (1800 ml per day)  Pro-stat 30 ml BID  Provides 2360 kcal, 130 gm protein, 1458 ml free water daily  NUTRITION DIAGNOSIS:   Inadequate oral intake related to inability to eat as evidenced by NPO status.  Ongoing  GOAL:   Patient will meet greater than or equal to 90% of their needs   Met with TF  MONITOR:   TF tolerance, Labs, I & O's  ASSESSMENT:   51 year old man with HIV/AIDS (first diagnosed 2003, last CD4 count 245 with viremia of 1,160 in October 2017; on Genovya, Darunavir and Bactrim), Genital Warts, COPD, and cocaine substance abuse who presented to [facility] on 1/24 with altered mental status. Intubated for airway protection and transferred to [facility].  Being treated for bacterial meningitis, HSV meningitis, and Klebsiella PNA. Patient now on trach collar for > 48 hours. Cortrak pulled by patient last night, so TF is off.  Cortrak tube was replaced today. Awaiting confirmation of placement to resume TF.  Free water flushes 200 ml every 6 hours Labs reviewed. Sodium 150 Medications reviewed and include Lactulose and Miralax.  Diet Order:   NPO  Skin:  Wound (see comment) (non pressure wound to scrotum)  Last BM:  2/14  Height:   Ht Readings from Last 1 Encounters:  12/01/16 _0  (1.93 m)    Weight:   Wt Readings from Last 1 Encounters:  12/22/16 198 lb 10.2 oz (90.1 kg)   12/09/16 233 lb 4 oz (105.8 kg)   12/01/16 205 lb (93 kg)     BMI=25    Ideal Body Weight:  91.8 kg  BMI:  Body mass index is 24.18 kg/m.  Estimated Nutritional Needs:   Kcal:  6503-5465  Protein:  115-135 gm  Fluid:  2.5 L  EDUCATION NEEDS:   No education needs identified at this time  Molli Barrows, [place], [place], [place] Pager [phone] After Hours Pager [phone]

## 2016-12-22 NOTE — Care Management Note (Signed)
Case Management Note  Patient Details  Name: Lawrence NorthDarren E Kane MRN: [id number] Date of Birth: [birth year]  Subjective/Objective:     Pt admitted with bacterial menegitis               Action/Plan: PTA from home - pt remains ventilated. Bacterial meningitis, possible parasitic given elevated eosinophils, also would consider coccidiodes. Possible LLL PNA, HIV > CD4 10  . Plan is to keep pt on full support until Monday 12/06/16 to allow for possible recovery - tentative GOC meeting scheduled Monday.  Mom and sister are POC.  CM will continue to follow for discharge needs   Expected Discharge Date:                  Expected Discharge Plan:     In-House Referral:  Clinical Social Work  Discharge planning Services  CM Consult  Post Acute Care Choice:    Choice offered to:     DME Arranged:    DME Agency:     HH Arranged:    HH Agency:     Status of Service:  In process, will continue to follow  If discussed at Long Length of Stay Meetings, dates discussed:    Additional Comments: 12/22/2016 CIR has deemed pt is not appropriate for program.  CM spoke with attending to clarify plan to prepare pt for SNF discharge;  Pt currently has #6 cuffed trach and will move to cuffless early next week.  CSW actively working placement.   12/21/16 Discussed in LOS 12/21/16 - pt remains appropriate for continued stay.  Pt deemed appropriate for CIR consult - consult ordered.  CSW following for SNF.  10/19/17 Pt is now on TC - will consult with CIR once pt has been on TC for at least 24 hours.  12/26/16 Pt is not eligible at this time for CIR consideration due to ventilator  12/14/16 Discussed in LOS 12/14/16 - pt remains appropriate for continued stay.  Pt was trached yesterday - remains ventilated via trach.  Discharge more probable SNF as pt is not eligible for LTACH due to insurance coverage and lack of support in the home.  Pts support system includes mom and sister.  CSW consulted for potential  discharge to SNF [name], Karlen Barbar S, RN 12/22/2016, 4:06 PM

## 2016-12-22 NOTE — Progress Notes (Signed)
I contacted pt's Mom and Aunt by phone in [place]. We discussed rehab venue options and caregiver support once pt eventually discharges home. Pt no longer has [place] apartment in [place]. Mom states she is his decision maker at this time. Mom states family is unable to care for pt 24/7 at the expected functional level he will be after any short term rehab stay. I recommended SNF rehab to Mom and she is in agreement. I have alerted RN CM and SW, Lelon MastSamantha and [place]. We will sign off at this time. Please call me with any questions. [phone]

## 2016-12-22 NOTE — Progress Notes (Addendum)
PULMONARY / CRITICAL CARE MEDICINE   Name: Lawrence Kane MRN: [id number] DOB: 09/24/1966    ADMISSION DATE:  12/01/2016 CONSULTATION DATE:  12/01/2016  REFERRING MD:  [name] transfer  CHIEF COMPLAINT:  AMS  BRIEF SUMMARY:   51 year old man with HIV/AIDS (first diagnosed 2003, last CD4 count 245 with viremia of 1,160 in October 2017; on Genovya, Darunavir and Bactrim), Genital Warts, COPD, and cocaine substance abuse who presented to [facility] on 1/24 with complaint of altered mental status. Per notes, patient may have recently had some type of surgery in [place] where his mother resides. Afebrile, tachycardic to 155, RR 18, hypertensive to 240/112. Patient was intubated for airway protection, CVL in IJ was placed, and LP was performed given his altered mental status. He was started on Versed and propofol for sedation. Initial labs showed UA negative for infection. UDS positive for cocaine. CSF appeared yellow, cloudy, xanthochromic, WBC 4845, RBC 625, glucose <20, total protein >300, Neutrophils 16%, Eos 64%, monocytes 17%, lymphocytes 3%. Gram stain performed, but unable to view results. ID was consulted. Dr. Baxter Flattery recommended continuing Truvada and Tivicay through NGT.  Prolonged respiratory failure s/p trach (2/5).  Weaned to ATC 2/6.    SUBJECTIVE:  RN reports no acute events overnight, remains on ATC.  Pt denies SOB/chest pain, abd pain.     VITAL SIGNS: BP (!) 143/105   Pulse 65   Temp 98.2 F (36.8 C) (Oral)   Resp (!) 23   Ht _0  (1.93 m)   Wt 210 lb 5.1 oz (95.4 kg)   SpO2 98%   BMI 25.60 kg/m   VENTILATOR SETTINGS: Vent Mode: PRVC FiO2 (%):  [40 %-60 %] 50 % Set Rate:  [16 bmp] 16 bmp Vt Set:  [620 mL] 620 mL PEEP:  [5 cmH20] 5 cmH20 Pressure Support:  [5 cmH20] 5 cmH20 Plateau Pressure:  [17 cmH20-24 cmH20] 19 cmH20  INTAKE / OUTPUT: I/O last 3 completed shifts: In: 2596.1 [P.O.:3; I.V.:770.8; NG/GT:1606.3; IV Piggyback:216] Out: 2815 [Urine:2590;  Emesis/NG output:225]  PHYSICAL EXAMINATION: General: chronically ill appearing M in NAD on ATC HEENT: MM pink/moist, trach midline c/d/i, creamy white secretions noted PSY: calm Neuro: Awake, alert, follows commands, moves UE, no follow command on LE's CV: s1s2 rrr, no m/r/g PULM: even/non-labored, lungs bilaterally with scattered rhonchi  LO:VFIE, non-tender, bsx4 active  Extremities: warm/dry, no edema  Skin: no rashes or lesions   LABS:  BMET  Recent Labs Lab 12/13/16 0219 12/15/16 0439 12/16/16 0239  NA 136 138 138  K 4.1 3.5 4.0  CL 97* 97* 102  CO2 35* 30 22  BUN 24* 28* 42*  CREATININE 0.82 0.79 0.93  GLUCOSE 223* 219* 334*    Electrolytes  Recent Labs Lab 12/11/16 0330 12/12/16 0319 12/13/16 0219 12/15/16 0439 12/16/16 0239  CALCIUM 8.2* 8.1* 8.4* 9.1 9.5  MG 1.9 1.7 2.0  --   --   PHOS 3.6 3.2 2.6 3.8  --     CBC  Recent Labs Lab 12/13/16 0219 12/15/16 0430 12/16/16 0239  WBC 10.5 11.9* 8.8  HGB 8.4* 9.3* 9.7*  HCT 26.7* 29.6* 31.7*  PLT 321 396 443*    Coag's  Recent Labs Lab 12/13/16 1433  APTT 35  INR 0.99    Sepsis Markers No results for input(s): LATICACIDVEN, PROCALCITON, O2SATVEN in the last 168 hours.  ABG No results for input(s): PHART, PCO2ART, PO2ART in the last 168 hours.  Liver Enzymes  Recent Labs Lab 12/11/16 0330 12/12/16 0319  12/13/16 0219 12/15/16 0439  AST 322* 197* 126*  --   ALT 391* 350* 248*  --   ALKPHOS 94 101 93  --   BILITOT 0.9 0.9 0.7  --   ALBUMIN 1.6* 1.8* 1.7* 1.9*    Cardiac Enzymes No results for input(s): TROPONINI, PROBNP in the last 168 hours.  Glucose  Recent Labs Lab 12/17/16 0226 12/17/16 0329 12/17/16 0426 12/17/16 0535 12/17/16 0643 12/17/16 0813  GLUCAP 142* 135* 149* 144* 153* 134*    Imaging Ct Abdomen Pelvis Wo Contrast  Result Date: 12/17/2016 CLINICAL DATA:  Bowel obstruction EXAM: CT ABDOMEN AND PELVIS WITHOUT CONTRAST TECHNIQUE: Multidetector CT  imaging of the abdomen and pelvis was performed following the standard protocol without IV contrast. COMPARISON:  12/16/2016, CT 11/29/2016 FINDINGS: Lower chest: Atelectasis or partial consolidation in the right lower lobe. Dense partial left lower lobe consolidation concerning for a pneumonia. Normal heart size. Hepatobiliary: No focal hepatic abnormality. The gallbladder is enlarged up to 5.1 cm. No calcified stones. No wall thickening. No biliary dilatation. Pancreas: Unremarkable. No pancreatic ductal dilatation or surrounding inflammatory changes. Spleen: Normal in size without focal abnormality. Adrenals/Urinary Tract: Adrenal glands are unremarkable. Kidneys are normal, without renal calculi, focal lesion, or hydronephrosis. Bladder contains a Foley catheter Stomach/Bowel: Esophageal tube is present in the stomach and the tip terminates in the proximal duodenum. There are borderline to slightly dilated fluid-filled loops of small bowel throughout the abdomen. There is enlarged fluid filled right colon. No wall thickening. The appendix is not well identified. Vascular/Lymphatic: Non aneurysmal aorta. No grossly enlarged lymph nodes. Reproductive: No masses. Other: No free air.  No free fluid. Musculoskeletal: No acute or suspicious bone lesions IMPRESSION: 1. Borderline to mildly enlarged loops of small bowel throughout the abdomen with dilated fluid-filled right colon and transverse colon, findings are suggestive of an ileus. Partial bowel obstruction included in the differential but felt less likely. 2. Partial consolidation in the left lower lobe concerning for pneumonia. Electronically Signed   By: Donavan Foil M.D.   On: 12/17/2016 01:36   Dg Abd Portable 1v  Result Date: 12/16/2016 CLINICAL DATA:  Assess nasogastric tube positioning. EXAM: PORTABLE ABDOMEN - 1 VIEW COMPARISON:  KUB of December 16, 2016 at 9:22 a.m. FINDINGS: The radiodense tipped feeding tube tip projects below the inferior margin of  the image. The orogastric tube has its proximal port in the gastric cardia with the tip re-curved upon itself pointing toward the GE junction. There are loops of moderately distended gas-filled small and large bowel present. There is dense atelectasis or pneumonia in the left lower lobe. IMPRESSION: The tip of the feeding tube projects below the inferior margin of the image. Both the proximal port and the tip of the orogastric tube lie in the region of the gastric cardia but the tip of the tube is directed back toward the GE junction. Electronically Signed   By: David  Martinique M.D.   On: 12/16/2016 15:58   Dg Abd Portable 1v  Result Date: 12/16/2016 CLINICAL DATA:  Abdominal distention EXAM: PORTABLE ABDOMEN - 1 VIEW COMPARISON:  December 14, 2016 FINDINGS: Feeding tube tip is in the third portion of the duodenum, unchanged. There are multiple loops of dilated bowel without bowel wall thickening. No free air evident. IMPRESSION: Bowel gas pattern is concerning for ileus or possibly early bowel obstruction. No demonstrable free air. Feeding tube tip in third portion duodenum. Electronically Signed   By: Lowella Grip III M.D.   On:  12/16/2016 09:37    STUDIES:  1/24 CXR >> ATX vs LLL infiltrate 1/25 CT head >> neg for acute intracranial process 1/25 LP cytology >> no malignant cells 1/25 LP >> yellow, cloudy, glucose 20, total protein >600, RBC 114>49, WBC 970, 36% neut, 20% lymphs, 19% eos 1/28 BAL cell count >> 266 WBC, eos 1 1/28 BAL cytology >> no malignant cells 1/27 BAL for wet mount> neg for parasites 1/27 MRI brain >> small acute infarcts in cerebellum and cerebral cortex, sucal signal 1/29 LP >> Glucose 82, protein >600, WBC 600>885, eosinophils 28% 1/29 Echo >> LV EF 60-65%, indeterminent diastolic function, wall motion normal 1/30 EEG >> diffuse slowing and background suppression, no seizures 2/3 MRI thoracic/lumbar/cervical >> (-) abscess. Meningitis. R/O CMV 2/8 abd/pevlis CT >>  borderline to mildly enlarged loops of small bowel throughout the abd with dilated fluid filled R colon and transverses colon, findings are suggestive of an ileus. Partial bowel obstruction included in the differential, but felt less likely. Partial consolidation in the LLL concerning for PNA.    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 1/24 Cryptococcal [name]): negative 1/24 CSF [facility]): NGTD 1/22 scrotal wound cx [name]): MRSA Toxoplasma [name]): Negative 1/25 BCx x2 : NGTD 1/25 HCV ab + 1/24 Trach asp 1/24: NGTD 1/24 MRSA PCR: MRSA pos 1/25 HIV quant: 80, CD4 10 1/25 CSF AFB: Smear neg, >> 1/25 CSF fungal cx: NGTD 1/25 CSF crypto ag: neg 1/25 CSF gram stain no organisms, cx: NGTD 1/27 strongyloides ab >> negative 1/27 coccidiomycosis ab >> 1/28 BAL AFB smear/cx> 1/28 BAL Fungus cx >> 1/28 BAL gram stain/Cx: 40K ESBL Klebsiella pneumoniae 1/28 BAL pneumocystis smear: Neg 1/29 CSF Cx >> no organisms on gram stain, >>  2/04 CSF CMV >> not done?  ANTIBIOTICS: Rocephin 1/24>>1/30 Vancomycin 1/24>>1/30; 2/2 > 2/05 Acyclovir 1/24>> 1/26, 1/27>> Ampicillin 1/24>> 1/27 Bactrim M/W/F for PCP prophylaxis Meropenem 1/30>> 2/1; 2/2 > 2/6 Pen G 2/1 >> 2/2 Cipro 2/1 >> 2/2  SIGNIFICANT EVENTS: 1/24  Transferred from [facility] 1/25  Underwent LP here 2/05  Trach 2/06  To ATC 2/08  abd distention, likely ileus 2/12  Abd improved, tol TF, weaning on ATC  LINES/TUBES: L IJ CVL 1/24 >> ETT 1/24 >> 2/5 Foley 1/24 >> OGT 1/24 >> 2/5 Trach (df) 2/5 >> NGT to suction 2/08  DISCUSSION: 51 year old man with HIV/AIDS (first diagnosed 2003, last CD4 count 245 with viremia of 1,160 in October 2017; on Genovya, Darunavir and Bactrim), Genital Warts, COPD, and cocaine substance abuse who presented to [facility] on 1/24 with altered mental status. Intubated for airway protection. Prolonged vent wean due to ESBL klebsiella HCAP with  sepsis > trach 2/5.    ASSESSMENT / PLAN:  PULMONARY A: Acute Hypoxemic Respiratory Failure - HCAP ESBL Klebsiella LLL+ sepsis + Unable to protect airway + pulm edema Tracheostomy Status - 2/5 P:   ATC as tolerated  Wean O2 for sats > 90% Pulmonary hygiene as able - mobilize, upright positioning  Follow intermittent CXR Clip trach sutures 2/12  Monitor abdominal exam   CARDIOVASCULAR A:  Sinus Tachycardia - resolved Incomplete RBBB HTN Pulm edema P:  SDU status  TRH Primary SVC  PRN hydralazine ASA  Defer diuresis to primary   RENAL A:   At Risk AKI - in setting of critical illness, diuresis Hypernatremia P:   Trend BMP / UOP  Replace electrolytes as indicated Free water 200 ml Q8  GASTROINTESTINAL A:  Moderate to Severe Protein Calorie Malnutrition  Transaminitis, (worsened by seroquel likely which was discontinued on 1/31).  Improved.  HCV ab+, Chronic hep b Abdominal Distention - bowel ileus vs partial SBO Loose stools = ileus , less likely obstruction withoutput P:   NPO  Defer ileus mgmt to primary  Continue Pepcid Defer lactulose / colace to primary (initially for constipation) TF per primary  HEMATOLOGIC A:   Mild leukocytosis > Resolved Mild anemia P:  Heparin for DVT prophylaxis   INFECTIOUS A:   Meningitis, bacterial + viral. 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 P:   Neurology following > rec's for repeat MRI to follow up weakness  Consider thoracic / lumbar MRI with LE weakness Continue acyclovir Continue klonopin, fentanyl  Will need slow wean off above  Steroid taper began 2/12   GLOBAL:  TRH primary, PCCM following for trach care.  Clip trach sutures 69/70.    51 year old male with HIV/AIDS and substance abuse who was intubated for airway protection and then needed a tracheostomy.  On ATC with coarse BS on exam.  I reviewed CXR myself, trach in good position and infiltrate noted.  Discussed with PCCM-NP.  Respiratory faiulre:  - Monitor for airway protection, SLP ordered  - Maintain trach as is, may change to cuffless trach next week if remains off vent  Hypoxemia:  - Titrate O2 for sat of 88-92%  - May need a formal desat study to qualify for home O2  Trach status:  - Remove sutures.  - May follow up with trach clinic 2 wks post discharge (call [phone] to schedule)  PCCM will sign off, please call back if needed  Patient seen and examined, agree with above note.  I dictated the care and orders written for this patient under my direction.  Rush Farmer, MD [phone]

## 2016-12-22 NOTE — Progress Notes (Addendum)
Pt arrived on unit. Respiratory notified supplies needed. Pt on partial re-breather for transport. Pt in restraints. Pt hs multiple stage II pressure ulcers on sacrum. Pt placed on telemetry box #34 and suction set up. Portable paged for Kangaroo pump, scd,  And continueous pulse ox

## 2016-12-22 NOTE — Consult Note (Signed)
Physical Medicine and Rehabilitation Consult  Reason for Consult: Transverse myelitis with quadriparesis Referring Physician:  Dr. Nelda Marseille.    HPI: Lawrence Kane is a 51 y.o. male with history of COPD, HIV, substance abuse, noncompliance, recent surgery in [place];  who was admitted via [facility] on 12/01/16 with AMS changes and septic shock due to bacterial meningitis. He was intubated for airway protection and started on broad spectrum antibiotics as well as acyclovir for scrotal lesions. LP with high protein, low glucose and high WBC with predominance of eosinophils. ID consulted and questioned parasitic infection. Repeat LP 1/29 showed elevated glucose, elevated WBC with 28% eosinophils. BAL cytology/wet mount negative for malignant cells or parasites. MRI brain done 1/27 due to ongoing decrease in level of responsiveness, decrease in movement of BUE and flaccid BLE. MRI brain reviewed, showing small infarcts in cerebellum and cortex.  ?due to vasospasms or central/embolic disease and generalized sinuitis/mastoidits. Neurology questioned HSV vasculopathy and recommended MRA brain for work up. MRA brain revealed irregular R-ICA no large vessel stenosis and beaded appearance of right cervical ICA compatible with fibromuscular dysplasia.  Infarcts felt to be due to vasculitis/vasospams secondary to meningoencephalitis. MRI total spine was negative for abscess or hematoma, generalized meningitis and radiculitis and multifocal dorsal thoracic cord signal abnormality from myelitis. 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   He has had loose stools due to partial SBO v/s ileus. Agitation improving. He has completed treatment for  ESBL K-pneumoniae in BAL.   He was unable to tolerate vent wean requiring tracheostomy 2/5. He has been weaned to ATC for past 2 days but continues to have copious  purulent secretions via trach as well as restraints due to poor safety. Therapy ongoing and CIR recommended for follow up therapy.      Review of Systems  Unable to perform ROS: Patient nonverbal     Past Medical History:  Diagnosis Date  . COPD (chronic obstructive pulmonary disease) ([place])   . Genital warts   . HIV (human immunodeficiency virus infection) ([place])   . Osteoporosis     Past Surgical History:  Procedure Laterality Date  . PROSTATE SURGERY  11/17/2016    Family History: Unable to elicit.     Social History:  Per reports that he has been smoking.  He has never used smokeless tobacco. Per reports that he drinks about 0.6 oz of alcohol per week .  Per reports that he does not use drugs.     Allergies  Allergen Reactions  . Iodine   . Ketorolac     unknown  . Tylenol [Acetaminophen]     unknown    Medications Prior to Admission  Medication Sig Dispense Refill  . azithromycin (ZITHROMAX) 600 MG tablet Take 600 mg by mouth 3 (three) times a week.  3  . clindamycin (CLEOCIN) 300 MG capsule Take 300 mg by mouth 3 (three) times daily as needed.     . doxycycline (VIBRAMYCIN) 100 MG capsule Take 1 capsule (100 mg total) by mouth 2 (two) times daily. 20 capsule 0  . gabapentin (NEURONTIN) 600 MG tablet Take 600 mg by mouth 3 (three) times daily.    . GENVOYA 150-150-200-10 MG TABS tablet Take 1 tablet by mouth daily.  3  . ipratropium-albuterol (DUONEB) 0.5-2.5 (3) MG/3ML SOLN Take 3 mLs by nebulization every 4 (four) hours  as needed (shortness of breathe).    Marland Kitchen PREZISTA 800 MG tablet Take 800 mg by mouth daily.  3  . TRUVADA 200-300 MG tablet Take 1 tablet by mouth daily.  5  . diphenhydrAMINE (SOMINEX) 25 MG tablet Take 25 mg by mouth every 6 (six) hours as needed for itching.    Marland Kitchen oxyCODONE-acetaminophen (PERCOCET) 10-325 MG tablet Take 1 tablet by mouth every 6 (six) hours as needed for pain.  0  . polyvinyl alcohol (LIQUIFILM TEARS) 1.4 % ophthalmic solution Place  1 drop into both eyes daily as needed for dry eyes.      Home: Home Living Family/patient expects to be discharged to:: Unsure Living Arrangements: Alone Additional Comments: Pt states he was living with someone and caring for himself. No family present and unable to ascertain further PLOF due to trach/vent  Functional History:   Functional Status:  Mobility: Bed Mobility Overal bed mobility: Needs Assistance Bed Mobility: Supine to Sit, Sit to Supine Supine to sit: HOB elevated, Total assist, +2 for physical assistance, +2 for safety/equipment Sit to supine: Total assist, +2 for safety/equipment, +2 for physical assistance General bed mobility comments: pt with total assist to bring bil LE off of and onto bed, assist to elevate trunk and control return to surface. Use of pad to rotate pelvis and scoot to EOB. total assist +2 to scoot to [place] transfer comment: unsafe to attempt at this time      ADL:    Cognition: Cognition Overall Cognitive Status: Difficult to assess Orientation Level: Intubated/Tracheostomy - Unable to assess Cognition Arousal/Alertness: Awake/alert Behavior During Therapy: WFL for tasks assessed/performed Overall Cognitive Status: Difficult to assess Difficult to assess due to: Tracheostomy   Blood pressure 134/76, pulse 95, temperature 97.8 F (36.6 C), temperature source Oral, resp. rate 20, height _0  (1.93 m), weight 90.1 kg (198 lb 10.2 oz), SpO2 100 %. Physical Exam  Nursing note and vitals reviewed. Constitutional: He appears well-developed and well-nourished.  Up in bed with ATC-copious drainage on gown.  BUE in soft restraints.   HENT:  Head: Normocephalic and atraumatic.  Eyes: Conjunctivae and EOM are normal. Pupils are equal, round, and reactive to light.  Neck:  Trach in place--congested sounds.   Cardiovascular: Normal rate and regular rhythm.   Respiratory: Effort normal. He has rhonchi. He exhibits no  tenderness.  Congested upper airway sounds.   GI: Soft. Bowel sounds are normal. He exhibits no distension. There is no tenderness.  Genitourinary:  Genitourinary Comments: Rectal tube and foley in place.   Musculoskeletal: He exhibits edema.  BLE flaccid with 1+ edema bilateral feet. 1+ edema bilateral hands.  Hallux Valgus b/l feet  Neurological: He is alert. He displays abnormal reflex.  Unable to phonate.  Perseverative behaviors with poor safety.  He was unable to write to communicate and tended to pull on lines when restraints released.  Delayed processing with cues needed for two step commands.  BLE flaccid.  ?BUE weakness LUE> RUE.   Sensation ?intact to light touch  Skin: Skin is warm and dry.  Psychiatric: His mood appears anxious. Cognition and memory are impaired. He expresses impulsivity. He is inattentive.    Results for orders placed or performed during the hospital encounter of 12/01/16 (from the past 24 hour(s))  Glucose, capillary     Status: Abnormal   Collection Time: 12/21/16 12:22 PM  Result Value Ref Range   Glucose-Capillary 233 (H) 65 - 99 mg/dL   Comment 1  Glucose Stabilizer   Glucose, capillary     Status: Abnormal   Collection Time: 12/21/16  3:41 PM  Result Value Ref Range   Glucose-Capillary 187 (H) 65 - 99 mg/dL   Comment 1 Document in Chart   Glucose, capillary     Status: Abnormal   Collection Time: 12/21/16  7:54 PM  Result Value Ref Range   Glucose-Capillary 191 (H) 65 - 99 mg/dL  Glucose, capillary     Status: Abnormal   Collection Time: 12/21/16 11:42 PM  Result Value Ref Range   Glucose-Capillary 216 (H) 65 - 99 mg/dL   Comment 1 Notify RN   Glucose, capillary     Status: Abnormal   Collection Time: 12/22/16  3:54 AM  Result Value Ref Range   Glucose-Capillary 179 (H) 65 - 99 mg/dL   Comment 1 Notify RN   CBC     Status: Abnormal   Collection Time: 12/22/16  7:30 AM  Result Value Ref Range   WBC 11.2 (H) 4.0 - 10.5 K/uL   RBC 3.49  (L) 4.22 - 5.81 MIL/uL   Hemoglobin 10.4 (L) 13.0 - 17.0 g/dL   HCT 34.2 (L) 39.0 - 52.0 %   MCV 98.0 78.0 - 100.0 fL   MCH 29.8 26.0 - 34.0 pg   MCHC 30.4 30.0 - 36.0 g/dL   RDW 17.4 (H) 11.5 - 15.5 %   Platelets 384 150 - 400 K/uL  Basic metabolic panel     Status: Abnormal   Collection Time: 12/22/16  7:30 AM  Result Value Ref Range   Sodium 150 (H) 135 - 145 mmol/L   Potassium 4.6 3.5 - 5.1 mmol/L   Chloride 116 (H) 101 - 111 mmol/L   CO2 25 22 - 32 mmol/L   Glucose, Bld 201 (H) 65 - 99 mg/dL   BUN 36 (H) 6 - 20 mg/dL   Creatinine, Ser 0.78 0.61 - 1.24 mg/dL   Calcium 9.3 8.9 - 10.3 mg/dL   GFR calc non Af Amer >60 >60 mL/min   GFR calc Af Amer >60 >60 mL/min   Anion gap 9 5 - 15  Glucose, capillary     Status: Abnormal   Collection Time: 12/22/16  7:54 AM  Result Value Ref Range   Glucose-Capillary 170 (H) 65 - 99 mg/dL   Comment 1 Notify RN    Comment 2 Document in Chart    No results found.  Assessment/Plan: Diagnosis: Transverse myelitis with quadriparesis with stroke Labs independently reviewed.  Records reviewed and summated above.  1. Does the need for close, 24 hr/day medical supervision in concert with the patient's rehab needs make it unreasonable for this patient to be served in a less intensive setting? Yes 2. Co-Morbidities requiring supervision/potential complications: COPD (monitor O2 Sats and RR with increased activity), HIV, substance abuse (counsel when appropriate), noncompliance (counsel when appropriate), recent surgery in [place], bacterial meningitis (cont meds), meningoencephalitis (cont meds), agitation (wean IV ativan and IV fentanyl when appropriate), pain Biofeedback training with therapies to help reduce reliance on opiate pain medications, particularly IV fentanyl, monitor pain control during therapies, and sedation at rest and titrate to maximum efficacy to ensure participation and gains in therapies), hypernatremia (cont to monitor, treat as  necessary), leukocytosis (cont to monitor for signs and symptoms of infection, further workup if indicated), ABLA (transfuse if necessary to ensure appropriate perfusion for increased activity tolerance), Respiratory failure s/p trach 3. Due to bladder management, bowel management, safety, skin/wound care, disease management,  medication administration, pain management and patient education, does the patient require 24 hr/day rehab nursing? Yes 4. Does the patient require coordinated care of a physician, rehab nurse, PT (1-2 hrs/day, 5 days/week), OT (1-2 hrs/day, 5 days/week) and SLP (1-2 hrs/day, 5 days/week) to address physical and functional deficits in the context of the above medical diagnosis(es)? Yes Addressing deficits in the following areas: balance, endurance, locomotion, strength, transferring, bowel/bladder control, bathing, dressing, feeding, grooming, toileting, cognition, speech, language, swallowing and psychosocial support 5. Can the patient actively participate in an intensive therapy program of at least 3 hrs of therapy per day at least 5 days per week? Potentially 6. The potential for patient to make measurable gains while on inpatient rehab is excellent 7. Anticipated functional outcomes upon discharge from inpatient rehab are min assist and mod assist  with PT, min assist and mod assist with OT, min assist with SLP. 8. Estimated rehab length of stay to reach the above functional goals is: 25-30 days. 9. Does the patient have adequate social supports and living environment to accommodate these discharge functional goals? Potentially 10. Anticipated D/C setting: TBD 11. Anticipated post D/C treatments: HH therapy and Home excercise program 12. Overall Rehab/Functional Prognosis: good  RECOMMENDATIONS: This patient's condition is appropriate for continued rehabilitative care in the following setting: Potentially CIR if caregiver support available upon discharge once medically stable  and able to tolerate 3 hours of therapy/day.  Patient has agreed to participate in recommended program. Potentially Note that insurance prior authorization may be required for reimbursement for recommended care.  Comment: Rehab Admissions Coordinator to follow up.  Delice Lesch, MD, [address], [place] 12/22/2016

## 2016-12-22 NOTE — Progress Notes (Signed)
Physical Therapy Treatment Patient Details Name: Lawrence Kane MRN: [name] DOB: 1966-09-06 Today's Date: 12/22/2016    History of Present Illness 51 yo [facility] with AMS and intubated. 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 Pt with bil LE flaccid since admission. PMHx:HIV/AIDS, Hep C, Hep B, COPD, cocaine use     PT Comments    Pt nods head to yes/no questions, but not 100% consistently and at times needs additional cues for following directions.  Pt on trach collar with O2 sats remaining in mid 90s and HR 80s to 100 throughout activity.  Pt participating in bed mobility today utilizing Bil UEs, but Bil LEs remain flaccid.  Noted that pt does not have Ltach coverage, so feel he would benefit from CIR level of therapies to overall decrease burden of care and maximize education.  Will continue to follow.    Follow Up Recommendations  CIR     Equipment Recommendations  Wheelchair (measurements PT);Wheelchair cushion (measurements PT);Hospital bed    Recommendations for Other Services       Precautions / Restrictions Precautions Precautions: Fall Precaution Comments: Trach collar, panda, bil wrist retraints, mittens, and Bil LEs flaccid Restrictions Weight Bearing Restrictions: No    Mobility  Bed Mobility Overal bed mobility: Needs Assistance Bed Mobility: Supine to Sit;Sit to Supine     Supine to sit: Max assist;+2 for physical assistance;HOB elevated Sit to supine: Max assist;+2 for physical assistance   General bed mobility comments: pt needs cues and facilitation throughout mobility, but does participate with Bil UEs.  pt pulls on bed rails to A with coming to sitting and seems to attempt to use UEs to control descent to supine.  No movement in Bil LEs.    Transfers                    Ambulation/Gait                  Stairs            Wheelchair Mobility    Modified Rankin (Stroke Patients Only) Modified Rankin (Stroke Patients Only) Pre-Morbid Rankin Score: No symptoms Modified Rankin: Severe disability     Balance Overall balance assessment: Needs assistance Sitting-balance support: Bilateral upper extremity supported;Feet supported Sitting balance-Leahy Scale: Poor Sitting balance - Comments: pt initially leans posteriorly, but then starts to lean anteriorly.  pt needs between MinA and Max A to maintain balance.   Postural control: Posterior lean (Anteriorly lean)                          Cognition Arousal/Alertness: Awake/alert Behavior During Therapy: WFL for tasks assessed/performed Overall Cognitive Status: Difficult to assess                 General Comments: pt follows most one step directions and participates, however needs question repeated at times.  pt with trach and unable to verablize, but does gesture and occasionally nods head.      Exercises      General Comments        Pertinent Vitals/Pain Pain Assessment: No/denies pain    Home Living                      Prior Function            PT Goals (current  goals can now be found in the care plan section) Acute Rehab PT Goals Patient Stated Goal: pt unable to state PT Goal Formulation: Patient unable to participate in goal setting Time For Goal Achievement: 12/30/16 Potential to Achieve Goals: Fair Progress towards PT goals: Progressing toward goals    Frequency    Min 3X/week      PT Plan Current plan remains appropriate    Co-evaluation             End of Session Equipment Utilized During Treatment: Oxygen (Trach collar) Activity Tolerance: Patient tolerated treatment well Patient left: in bed;with call bell/phone within reach;with bed alarm set;with restraints reapplied (Bed in chair position 45 degrees)     Time: 1610-9604 PT Time Calculation (min)  (ACUTE ONLY): 24 min  Charges:  $Therapeutic Activity: 23-37 mins                    G CodesSunny Kane, [place] [phone] 12/22/2016, 10:01 AM

## 2016-12-22 NOTE — Progress Notes (Signed)
Changed Trach Collar due to being soiled. RT will continue to monitor

## 2016-12-22 NOTE — Progress Notes (Signed)
Suctioned patient. Morderate amount of thick yellow mucous.

## 2016-12-22 NOTE — Progress Notes (Signed)
[facility] PROGRESS NOTE  Lawrence Kane JTT:[id number] DOB: 12-29-1965 DOA: 12/01/2016  PCP: No primary care provider on file.  Brief History/Interval Summary: 51 year old man with HIV/AIDS (first diagnosed 2003, last CD4 count 245 with viremia of 1,160 in October 2017; on Genovya, Darunavir and Bactrim), Genital Warts, COPD, and cocaine substance abuse who presented to [facility] on 1/24 with complaint of altered mental status. Per notes, patient may have recently had some type of surgery in [place] where his mother resides. Afebrile, tachycardic to 155, RR 18, hypertensive to 240/112. Patient was intubated for airway protection, CVL in IJ was placed, and LP was performed given his altered mental status. ID was consulted. Patient was placed on antibiotics. He subsequently had to undergo tracheostomy. Subsequently, patient developed an ileus. NG tube was placed to intermittent suction. He has improved.  Reason for Visit: Meningitis. Transverse myelitis. Ileus.  Consultants: Infectious disease. Critical care medicine. Neurology.  STUDIES:  1/24 CXR >> ATX vs LLL infiltrate 1/25 CT head >> neg for acute intracranial process 1/25 LP cytology >> no malignant cells 1/25 LP >> yellow, cloudy, glucose 20, total protein >600, RBC 114>49, WBC 970, 36% neutrophil, 20% lymphs, 19% eos 1/28 BAL cell count >> 266 WBC, eos 1 1/28 BAL cytology >> no malignant cells 1/27 BAL for wet mount> neg for parasites 1/27 MRI brain >> small acute infarcts in cerebellum and cerebral cortex, sucal signal 1/29 LP >> Glucose 82, protein >600, WBC 600>885, eosinophils 28% 1/29 Echo >> LV EF 60-65%, indeterminent diastolic function, wall motion normal 1/30 EEG >> diffuse slowing and background suppression, no seizures 2/3 MRI thoracic/lumbar/cervical >> (-) abscess. Meningitis. R/O CMV   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 1/24 Cryptococcal [name]):  negative 1/24 CSF [facility]): NGTD 1/22 scrotal wound cx [name]): MRSA Toxoplasma [name]): Negative 1/25BCx x2 : NGTD 1/25 HCV ab + 1/24 Trach asp 1/24: NGTD 1/24MRSA PCR: MRSA pos 1/25 HIV quant: 80, CD4 10 1/25 CSF AFB: Smear neg, >> 1/25 CSF fungal cx: NGTD 1/25 CSF crypto ag: neg 1/25 CSF gram stain no organisms, cx: NGTD 1/27 strongyloides ab >> negative 1/27 coccidiomycosis ab >> 1/28 BAL AFB smear/cx> 1/28 BAL Fungus cx >> 1/28 BAL gram stain/Cx: 40K ESBL Klebsiella pneumoniae 1/28 BAL pneumocystis smear: Neg 1/27 O&P >> 1/29 CSF Cx >> no organisms on gram stain, >> NGTD 2/04 CSF CMV >> not done?   ANTIBIOTICS: Rocephin 1/24>>1/30 Vancomycin 1/24>>1/30; 2/2 >  Acyclovir 1/24>> 1/26, 1/27>> Ampicillin 1/24>> 1/27 Bactrim M/W/F for PCP prophylaxis Meropenem 1/30>> 2/1; 2/2 >  Pen G 2/1 >> 2/2 Cipro 2/1 >> 2/2  SIGNIFICANT EVENTS: 1/24  Transferred from [facility] 1/25  Underwent LP here 2/05  Trach 2/06  To ATC 2/8  patient developed ileus  LINES/TUBES: L IJ CVL 1/24 >> ETT 1/24 >> 2/5 Foley 1/24 >> OGT 1/24 >> 2/5 Trach (df) 2/5 >>   Subjective/Interval History: Patient is trying to communicate but unable to do so due to tracheostomy. Does not appear to be in any discomfort.  ROS: Unable to do.  Objective:  Vital Signs  Vitals:   12/22/16 0400 12/22/16 0413 12/22/16 0500 12/22/16 0600  BP: (!) 163/101  (!) 160/95 (!) 156/105  Pulse: 66  80 64  Resp: (!) 25  (!) 22 17  Temp:      TempSrc:      SpO2: 100%  100% 100%  Weight:  90.1 kg (198 lb 10.2 oz)  Height:        Intake/Output Summary (Last 24 hours) at 12/22/16 0736 Last data filed at 12/22/16 0600  Gross per 24 hour  Intake           1392.5 ml  Output             2020 ml  Net           -627.5 ml   Filed Weights   12/20/16 0500 12/21/16 0500 12/22/16 0413  Weight: 91.4 kg (201 lb 8 oz) 92.4 kg (203 lb 11.3 oz) 90.1 kg (198 lb 10.2 oz)    General appearance: Patient  is awake and alert. Neck: Tracheostomy noted Resp: Good air entry bilaterally. No wheezing, rales or rhonchi. diminished at the bases.  Cardio: S1, S2, is bradycardic, regular. No S3, S4. No rubs, murmurs, or bruit. Minimal pedal edema.  GI: Abdomen remains soft. Not distended. Bowel sounds are present. No masses or organomegaly. Nontender.  Extremities: Minimal edema noted bilateral lower extremities Pulses: 2+ and symmetric Neurologic: Patient is awake and alert. Moving his upper extremities. No movement noted in the lower extremities.  Lab Results:  Data Reviewed: I have personally reviewed following labs and imaging studies  CBC:  Recent Labs Lab 12/16/16 0239 12/18/16 0328 12/19/16 0910 12/21/16 0233  WBC 8.8 8.4 9.2 12.5*  HGB 9.7* 9.7* 9.4* 10.1*  HCT 31.7* 31.1* 30.9* 33.1*  MCV 96.9 96.9 97.8 98.2  PLT 443* 444* 396 295    Basic Metabolic Panel:  Recent Labs Lab 12/17/16 0900 12/18/16 0328 12/19/16 0910 12/20/16 0926 12/21/16 0233  NA 145 145 149* 151* 152*  K 4.0 3.6 3.5 4.0 4.2  CL 107 107 113* 119* 118*  CO2 _0 GLUCOSE 140* 192* 217* 157* 226*  BUN 55* 49* 51* 44* 41*  CREATININE 1.08 1.03 1.16 0.91 0.90  CALCIUM 9.4 9.2 9.3 9.4 9.2  MG  --   --  2.5*  --   --     GFR: Estimated Creatinine Clearance: 119.2 mL/min (by C-G formula based on SCr of 0.9 mg/dL).  Liver Function Tests:  Recent Labs Lab 12/19/16 0910  AST 40  ALT 88*  ALKPHOS 61  BILITOT 0.5  PROT 7.9  ALBUMIN 2.0*     Coagulation Profile: No results for input(s): INR, PROTIME in the last 168 hours.  CBG:  Recent Labs Lab 12/21/16 1222 12/21/16 1541 12/21/16 1954 12/21/16 2342 12/22/16 0354  GLUCAP 233* 187* 191* 216* 179*     No results found for this or any previous visit (from the past 240 hour(s)).    Radiology Studies: No results found.   Medications:  Scheduled: . small volume/piggyback builder   Intravenous Q8H  . aspirin  325 mg Per  Tube Daily  . chlorhexidine gluconate (MEDLINE KIT)  15 mL Mouth Rinse BID  . clonazePAM  2 mg Per Tube TID  . darunavir-cobicistat  1 tablet Oral Q breakfast  . dolutegravir  50 mg Oral Daily  . emtricitabine-tenofovir AF  1 tablet Per Tube Daily  . famotidine  20 mg Per Tube BID  . fentaNYL  50 mcg Transdermal Q72H  . free water  200 mL Per Tube Q6H  . heparin  5,000 Units Subcutaneous Q8H  . insulin aspart  0-15 Units Subcutaneous Q4H  . insulin glargine  18 Units Subcutaneous BID  . lactulose  10 g Per Tube BID  . LORazepam  2 mg Intravenous Q6H  . mouth  rinse  15 mL Mouth Rinse QID  . methylPREDNISolone (SOLU-MEDROL) injection  40 mg Intravenous Q8H  . sulfamethoxazole-trimethoprim  20 mL Per Tube Daily   Continuous: . feeding supplement (VITAL AF 1.2 CAL) Stopped (12/21/16 2330)   WUJ:WJXBJY chloride, acetaminophen, chlorproMAZINE (THORAZINE) IV, docusate, fentaNYL (SUBLIMAZE) injection, haloperidol lactate, hydrALAZINE, ibuprofen, sennosides  Assessment/Plan:  Active Problems:   COPD (chronic obstructive pulmonary disease) (HCC)   Substance abuse   Genital warts   HIV (human immunodeficiency virus infection) ([place])   AIDS (acquired immune deficiency syndrome) ([place])   Noncompliance   Bacterial meningitis   Hypertensive urgency   Septic shock (HCC)   Transaminitis   Cocaine abuse   Open wound   Acute respiratory failure (HCC)   Altered mental status   Acute encephalopathy   Cerebral embolism with cerebral infarction   Endotracheally intubated   Meningoencephalitis   Neurosyphilis   Herpesviral meningitis   Paraplegia ([place])   Acute pulmonary edema (HCC)   Transverse myelitis ([place])   Encounter for orogastric (OG) tube placement   Somnolence   Abdominal distention   Ileus (HCC)   Muscle weakness (generalized)   Acute respiratory failure with hypoxia (HCC)   Tracheostomy status (HCC)   Hypoxemia    Acute Hypoxemic Respiratory Failure , Tracheostomy  2/5 This was secondary to HCAP ESBL Klebsiella LLL+ sepsis + Unable to protect airway + pulm edema. Patient was on the ventilator for a prolonged duration. Subsequently underwent tracheostomy placement on 2/5. Pulmonology is following. Patient is off of the Precedex infusion.   Was on trach collar all day yesterday and tolerated it well.  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 Infectious disease has been following. Patient is on acyclovir. He will need this for total of 21 days. Will complete treatment on 2/17. Off of meropenem. Also on Bactrim for PCP prophylaxis.  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 Patient is on acyclovir. High-dose steroids 1000 mg/day , given for 5 days. Now on low-dose steroid. This will need to be slowly tapered down. Neurology has seen the patient. On fentanyl patch for pain issues. Started during this hospitalization.Re-image spine in 2-3 weeks to assess for improvement. Neurology signed off 2/12.  Ileus Improved. Patient had NG tube placed 2/8. Tolerating tube feeding. Okay to take about NG tube. Increase rate of tube feeding. Continue laxatives.   Hyperglycemia Unknown if patient has a history of diabetes. HbA1c 6.6 on 1/26. CBGs were extremely elevated due to steroids. He was initially placed on Lantus. However, CBGs were not adequately controlled. So, started on insulin infusion. Patient transitioned to Lantus on 2/12.    Bradycardia Heart rate has improved. Bradycardia was most likely due to medication such as versed. TSH is normal. Continue to monitor.   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 On aspirin. Echocardiogram shows normal systolic function. LDL 93. PT and OT evaluation. No significant carotid stenosis noted on MRA neck. She was seen by neurology. They had mentioned that a TEE could be  considered once the patient was more stable. This can be pursued at a later date, although embolic stroke appeared to be less of a possibility based on his clinical scenario.  Pulmonary edema Seems to have improved. Continues to be on IV Lasix, which will be continued for now.   Moderate to Severe Protein Calorie Malnutrition  Feedings on hold  due to ileus. Anticipate resuming 2/12.  Hypernatremia Free water to continue. Increase to every 6 hours. Repeat labs tomorrow. Tube feedings reinitiated. Sodium level should come down.  Transaminitis Worsened by seroquel likely which was discontinued on 1/31.  Improved.   HCV ab+, Chronic hep b Management will be pursued as outpatient.  Normocytic anemia Hemoglobin stable. No evidence for overt bleeding. Continue to monitor.  LLL PNA with ESBL Klebsiella  Completed treatment. Monitor WBC.  HIV On antiretroviral treatment. Infectious disease was following.  Metabolic Encephalopathy Continue to monitor. Seems to be stable. PRN Sedative agents for agitation. Restraints in place to prevent him from pulling out supportive tubes.  History of Cocaine Abuse Will need counseling when able to communicate effectively  DVT Prophylaxis: Subcutaneous heparin    Code Status: Full code  Family Communication: No family at bedside  Disposition Plan: Therapy ongoing and CIR recommended for follow up therapy.    LOS: 21 days   [place] [facility] Pager [phone] 12/22/2016, 7:36 AM  If 7PM-7AM, please contact night-coverage at [email or web address], password [facility]

## 2016-12-23 ENCOUNTER — Inpatient Hospital Stay (HOSPITAL_COMMUNITY): Payer: Medicaid Other

## 2016-12-23 LAB — COMPREHENSIVE METABOLIC PANEL
ALBUMIN: 2.1 g/dL — AB (ref 3.5–5.0)
ALK PHOS: 61 U/L (ref 38–126)
ALT: 90 U/L — AB (ref 17–63)
ANION GAP: 7 (ref 5–15)
AST: 54 U/L — ABNORMAL HIGH (ref 15–41)
BUN: 37 mg/dL — ABNORMAL HIGH (ref 6–20)
CHLORIDE: 115 mmol/L — AB (ref 101–111)
CO2: 22 mmol/L (ref 22–32)
Calcium: 9.1 mg/dL (ref 8.9–10.3)
Creatinine, Ser: 0.87 mg/dL (ref 0.61–1.24)
GFR calc non Af Amer: >60 mL/min (ref >60)
GLUCOSE: 277 mg/dL — AB (ref 65–99)
Potassium: 4.5 mmol/L (ref 3.5–5.1)
SODIUM: 144 mmol/L (ref 135–145)
Total Bilirubin: 0.6 mg/dL (ref 0.3–1.2)
Total Protein: 7.3 g/dL (ref 6.5–8.1)

## 2016-12-23 LAB — GLUCOSE, CAPILLARY
GLUCOSE-CAPILLARY: 253 mg/dL — AB (ref 65–99)
GLUCOSE-CAPILLARY: 99 mg/dL (ref 65–99)
Glucose-Capillary: 101 mg/dL — ABNORMAL HIGH (ref 65–99)
Glucose-Capillary: 133 mg/dL — ABNORMAL HIGH (ref 65–99)
Glucose-Capillary: 179 mg/dL — ABNORMAL HIGH (ref 65–99)
Glucose-Capillary: 251 mg/dL — ABNORMAL HIGH (ref 65–99)
Glucose-Capillary: 349 mg/dL — ABNORMAL HIGH (ref 65–99)

## 2016-12-23 LAB — CBC
HEMATOCRIT: 35.3 % — AB (ref 39.0–52.0)
Hemoglobin: 10.7 g/dL — ABNORMAL LOW (ref 13.0–17.0)
MCH: 29.9 pg (ref 26.0–34.0)
MCHC: 30.3 g/dL (ref 30.0–36.0)
MCV: 98.6 fL (ref 78.0–100.0)
PLATELETS: 384 10*3/uL (ref 150–400)
RBC: 3.58 MIL/uL — ABNORMAL LOW (ref 4.22–5.81)
RDW: 17.6 % — ABNORMAL HIGH (ref 11.5–15.5)
WBC: 9.5 10*3/uL (ref 4.0–10.5)

## 2016-12-23 MED ORDER — GUAIFENESIN-DM 100-10 MG/5ML PO SYRP
5.0000 mL | ORAL_SOLUTION | ORAL | Status: DC | PRN
  Filled 2016-12-23: qty 5

## 2016-12-23 MED ORDER — VANCOMYCIN HCL 10 G IV SOLR
1250.0000 mg | Freq: Three times a day (TID) | INTRAVENOUS | Status: DC
  Administered 2016-12-23 – 2016-12-30 (×18): 1250 mg via INTRAVENOUS
  Filled 2016-12-23 (×22): qty 1250

## 2016-12-23 MED ORDER — INSULIN GLARGINE 100 UNIT/ML ~~LOC~~ SOLN
22.0000 [IU] | Freq: Two times a day (BID) | SUBCUTANEOUS | Status: DC
  Administered 2016-12-24 (×2): 22 [IU] via SUBCUTANEOUS
  Filled 2016-12-23 (×6): qty 0.22

## 2016-12-23 MED ORDER — SCOPOLAMINE 1 MG/3DAYS TD PT72
1.0000 | MEDICATED_PATCH | TRANSDERMAL | Status: DC
  Administered 2016-12-23 – 2017-01-04 (×5): 1.5 mg via TRANSDERMAL
  Filled 2016-12-23 (×6): qty 1

## 2016-12-23 MED ORDER — SODIUM CHLORIDE 0.9 % IV SOLN
2.0000 g | Freq: Three times a day (TID) | INTRAVENOUS | Status: DC
  Administered 2016-12-23 – 2016-12-30 (×18): 2 g via INTRAVENOUS
  Filled 2016-12-23 (×23): qty 2

## 2016-12-23 NOTE — Progress Notes (Signed)
[facility] PROGRESS NOTE  Lawrence Kane RDE:[id number] DOB: 1966/04/18 DOA: 12/01/2016  PCP: No primary care provider on file.  Brief History/Interval Summary: 51 year old man with HIV/AIDS (first diagnosed 2003, last CD4 count 245 with viremia of 1,160 in October 2017; on Genovya, Darunavir and Bactrim), Genital Warts, COPD, and cocaine substance abuse who presented to [facility] on 1/24 with complaint of altered mental status. Per notes, patient may have recently had some type of surgery in [place] where his mother resides. Afebrile, tachycardic to 155, RR 18, hypertensive to 240/112. Patient was intubated for airway protection, CVL in IJ was placed, and LP was performed given his altered mental status. ID was consulted. Patient was placed on antibiotics. He subsequently had to undergo tracheostomy. Subsequently, patient developed an ileus. NG tube was placed to intermittent suction. He has improved.  Reason for Visit: Meningitis. Transverse myelitis. Ileus.  Consultants: Infectious disease. Critical care medicine. Neurology.  STUDIES:  1/24 CXR >> ATX vs LLL infiltrate 1/25 CT head >> neg for acute intracranial process 1/25 LP cytology >> no malignant cells 1/25 LP >> yellow, cloudy, glucose 20, total protein >600, RBC 114>49, WBC 970, 36% neutrophil, 20% lymphs, 19% eos 1/28 BAL cell count >> 266 WBC, eos 1 1/28 BAL cytology >> no malignant cells 1/27 BAL for wet mount> neg for parasites 1/27 MRI brain >> small acute infarcts in cerebellum and cerebral cortex, sucal signal 1/29 LP >> Glucose 82, protein >600, WBC 600>885, eosinophils 28% 1/29 Echo >> LV EF 60-65%, indeterminent diastolic function, wall motion normal 1/30 EEG >> diffuse slowing and background suppression, no seizures 2/3 MRI thoracic/lumbar/cervical >> (-) abscess. Meningitis. R/O CMV   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 1/24 Cryptococcal [name]):  negative 1/24 CSF [facility]): NGTD 1/22 scrotal wound cx [name]): MRSA Toxoplasma [name]): Negative 1/25BCx x2 : NGTD 1/25 HCV ab + 1/24 Trach asp 1/24: NGTD 1/24MRSA PCR: MRSA pos 1/25 HIV quant: 80, CD4 10 1/25 CSF AFB: Smear neg, >> 1/25 CSF fungal cx: NGTD 1/25 CSF crypto ag: neg 1/25 CSF gram stain no organisms, cx: NGTD 1/27 strongyloides ab >> negative 1/27 coccidiomycosis ab >> 1/28 BAL AFB smear/cx> 1/28 BAL Fungus cx >> 1/28 BAL gram stain/Cx: 40K ESBL Klebsiella pneumoniae 1/28 BAL pneumocystis smear: Neg 1/27 O&P >> 1/29 CSF Cx >> no organisms on gram stain, >> NGTD 2/04 CSF CMV >> not done?   ANTIBIOTICS: Rocephin 1/24>>1/30 Vancomycin 1/24>>1/30; 2/2 >  Acyclovir 1/24>> 1/26, 1/27>> Ampicillin 1/24>> 1/27 Bactrim M/W/F for PCP prophylaxis Meropenem 1/30>> 2/1; 2/2 >  Pen G 2/1 >> 2/2 Cipro 2/1 >> 2/2  SIGNIFICANT EVENTS: 1/24  Transferred from [facility] 1/25  Underwent LP here 2/05  Trach 2/06  To ATC 2/8  patient developed ileus  LINES/TUBES: L IJ CVL 1/24 >> ETT 1/24 >> 2/5 Foley 1/24 >> OGT 1/24 >> 2/5 Trach (df) 2/5 >>   Subjective/Interval History: Mucopurulent secretions through  tracheostomy tube   ROS: Unable to do.  Objective:  Vital Signs  Vitals:   12/22/16 1857 12/22/16 1916 12/23/16 0230 12/23/16 0935  BP:  (!) 167/97    Pulse: 94 75 94 80  Resp: _0 Temp:  98.2 F (36.8 C)    TempSrc:  Oral    SpO2: 100% 98% 96%   Weight:      Height:        Intake/Output Summary (Last 24 hours) at 12/23/16 1226 Last data filed  at 12/23/16 0734  Gross per 24 hour  Intake              225 ml  Output             2175 ml  Net            -1950 ml   Filed Weights   12/20/16 0500 12/21/16 0500 12/22/16 0413  Weight: 91.4 kg (201 lb 8 oz) 92.4 kg (203 lb 11.3 oz) 90.1 kg (198 lb 10.2 oz)    General appearance: Patient is awake and alert. Neck: Tracheostomy noted Resp: Good air entry bilaterally. No wheezing,  rales or rhonchi. diminished at the bases.  Cardio: S1, S2, is bradycardic, regular. No S3, S4. No rubs, murmurs, or bruit. Minimal pedal edema.  GI: Abdomen remains soft. Not distended. Bowel sounds are present. No masses or organomegaly. Nontender.  Extremities: Minimal edema noted bilateral lower extremities Pulses: 2+ and symmetric Neurologic: Patient is awake and alert. Moving his upper extremities. No movement noted in the lower extremities.  Lab Results:  Data Reviewed: I have personally reviewed following labs and imaging studies  CBC:  Recent Labs Lab 12/18/16 0328 12/19/16 0910 12/21/16 0233 12/22/16 0730 12/23/16 0447  WBC 8.4 9.2 12.5* 11.2* 9.5  HGB 9.7* 9.4* 10.1* 10.4* 10.7*  HCT 31.1* 30.9* 33.1* 34.2* 35.3*  MCV 96.9 97.8 98.2 98.0 98.6  PLT 444* 396 377 384 553    Basic Metabolic Panel:  Recent Labs Lab 12/19/16 0910 12/20/16 0926 12/21/16 0233 12/22/16 0730 12/23/16 0447  NA 149* 151* 152* 150* 144  K 3.5 4.0 4.2 4.6 4.5  CL 113* 119* 118* 116* 115*  CO2 _0 GLUCOSE 217* 157* 226* 201* 277*  BUN 51* 44* 41* 36* 37*  CREATININE 1.16 0.91 0.90 0.78 0.87  CALCIUM 9.3 9.4 9.2 9.3 9.1  MG 2.5*  --   --   --   --     GFR: Estimated Creatinine Clearance: 123.3 mL/min (by C-G formula based on SCr of 0.87 mg/dL).  Liver Function Tests:  Recent Labs Lab 12/19/16 0910 12/23/16 0447  AST 40 54*  ALT 88* 90*  ALKPHOS 61 61  BILITOT 0.5 0.6  PROT 7.9 7.3  ALBUMIN 2.0* 2.1*     Coagulation Profile: No results for input(s): INR, PROTIME in the last 168 hours.  CBG:  Recent Labs Lab 12/22/16 1555 12/22/16 2012 12/23/16 0008 12/23/16 0450 12/23/16 0806  GLUCAP 130* 240* 349* 251* 253*     No results found for this or any previous visit (from the past 240 hour(s)).    Radiology Studies: Dg Abd Portable 1v  Result Date: 12/22/2016 CLINICAL DATA:  Assess feeding tube position. EXAM: PORTABLE ABDOMEN - 1 VIEW COMPARISON:   KUB of December 20, 2016 FINDINGS: The radiodense tip of the feeding tube lies in the region of the pylorus or first portion of the duodenum. There is some looping of the feeding tube in the gastric cardia. The bowel gas pattern is within the limits of normal. The bony structures exhibit no acute abnormalities. IMPRESSION: There has been distal migration of the feeding tube since at the tip lies in the region of the pylorus or first portion of the duodenum. Electronically Signed   By: David  Martinique M.D.   On: 12/22/2016 15:31     Medications:  Scheduled: . small volume/piggyback builder   Intravenous Q8H  . aspirin  325 mg Per Tube Daily  . chlorhexidine  gluconate (MEDLINE KIT)  15 mL Mouth Rinse BID  . clonazePAM  2 mg Per Tube TID  . darunavir-cobicistat  1 tablet Oral Q breakfast  . dolutegravir  50 mg Oral Daily  . emtricitabine-tenofovir AF  1 tablet Per Tube Daily  . famotidine  20 mg Per Tube BID  . feeding supplement (PRO-STAT SUGAR FREE 64)  30 mL Per Tube BID  . fentaNYL  50 mcg Transdermal Q72H  . free water  200 mL Per Tube Q6H  . heparin  5,000 Units Subcutaneous Q8H  . insulin aspart  0-15 Units Subcutaneous Q4H  . insulin glargine  20 Units Subcutaneous BID  . lactulose  10 g Per Tube BID  . LORazepam  2 mg Intravenous Q6H  . mouth rinse  15 mL Mouth Rinse QID  . methylPREDNISolone (SOLU-MEDROL) injection  40 mg Intravenous Q12H   Followed by  . [START ON 12/24/2016] methylPREDNISolone (SOLU-MEDROL) injection  20 mg Intravenous Q12H   Followed by  . [START ON 12/26/2016] methylPREDNISolone (SOLU-MEDROL) injection  20 mg Intravenous Q24H   Followed by  . [START ON 12/29/2016] methylPREDNISolone (SOLU-MEDROL) injection  20 mg Intravenous QODAY  . sulfamethoxazole-trimethoprim  20 mL Per Tube Daily   Continuous: . feeding supplement (JEVITY 1.2 CAL) 1,000 mL (12/22/16 1848)   VOZ:DGUYQI chloride, acetaminophen, chlorproMAZINE (THORAZINE) IV, docusate, fentaNYL (SUBLIMAZE)  injection, guaiFENesin-dextromethorphan, haloperidol lactate, hydrALAZINE, sennosides  Assessment/Plan:  Active Problems:   COPD (chronic obstructive pulmonary disease) (HCC)   Substance abuse   Genital warts   HIV (human immunodeficiency virus infection) ([place])   AIDS (acquired immune deficiency syndrome) ([place])   Noncompliance   Bacterial meningitis   Hypertensive urgency   Septic shock (HCC)   Transaminitis   Cocaine abuse   Open wound   Acute respiratory failure (HCC)   Altered mental status   Acute encephalopathy   Cerebral embolism with cerebral infarction   Endotracheally intubated   Meningoencephalitis   Neurosyphilis   Herpesviral meningitis   Paraplegia ([place])   Acute pulmonary edema (HCC)   Transverse myelitis ([place])   Encounter for orogastric (OG) tube placement   Somnolence   Abdominal distention   Ileus (HCC)   Muscle weakness (generalized)   Acute respiratory failure with hypoxia (HCC)   Tracheostomy status (HCC)   Hypoxemia   Respiratory distress   Agitation   Hypernatremia   Leukocytosis   Acute blood loss anemia   Pain    Acute Hypoxemic Respiratory Failure , Tracheostomy 2/5 This was secondary to HCAP ESBL Klebsiella LLL+ sepsis + Unable to protect airway + pulm edema. Patient was on the ventilator for a prolonged duration. Subsequently underwent tracheostomy placement on 2/5. Pulmonology is following. Patient is off of the Precedex infusion.   Was on trach collar all day yesterday and tolerated it well. Starting to have mucopurulent secretions, patient received multiple courses of antibiotic between 1/24-2/7. Chest x-ray to rule out recurrent healthcare associated pneumonia. Start patient on Robitussin. 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 Infectious disease has  been following. Patient is on acyclovir. He will need this for total of 21 days. Will complete treatment on 2/17. Off of meropenem. Also on Bactrim for PCP prophylaxis.  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 Patient is on acyclovir. High-dose steroids 1000 mg/day , given for  5 days. Now on low-dose steroid. This will need to be slowly tapered down. Neurology has seen the patient. On fentanyl patch for pain issues. Started during this hospitalization.Re-image spine in 2-3 weeks to assess for improvement. Neurology signed off 2/12.  Ileus Improved. Patient had NG tube placed 2/8. Tolerating tube feeding. Okay to take about NG tube. Increase rate of tube feeding. Continue laxatives.   Hyperglycemia Unknown if patient has a history of diabetes. HbA1c 6.6 on 1/26. CBGs were extremely elevated due to steroids. He was initially placed on Lantus. However, CBGs were not adequately controlled. So, started on insulin infusion. Patient transitioned to Lantus on 2/12.    Bradycardia Heart rate has improved. Bradycardia was most likely due to medication such as versed. TSH is normal. Continue to monitor.   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 On aspirin. Echocardiogram shows normal systolic function. LDL 93. PT and OT evaluation. No significant carotid stenosis noted on MRA neck. She was seen by neurology. They had mentioned that a TEE could be considered once the patient was more stable. This can be pursued at a later date, although embolic stroke appeared to be less of a possibility based on his clinical scenario.  Pulmonary edema Seems to have improved. Continues to be on IV Lasix, which will be continued for now.   Moderate to Severe Protein Calorie Malnutrition  Feedings on hold due to ileus. Anticipate resuming 2/12.  Hypernatremia Free water to continue. Increase to every 6 hours.  Repeat labs tomorrow. Tube feedings reinitiated. Sodium level should come down.  Transaminitis Worsened by seroquel likely which was discontinued on 1/31.  Improved.   HCV ab+, Chronic hep b Management will be pursued as outpatient.  Normocytic anemia Hemoglobin stable. No evidence for overt bleeding. Continue to monitor.  LLL PNA with ESBL Klebsiella  Completed treatment. Monitor WBC.  HIV On antiretroviral treatment. Infectious disease was following.  Metabolic Encephalopathy Continue to monitor. Seems to be stable. PRN Sedative agents for agitation. Restraints in place to prevent him from pulling out supportive tubes.  History of Cocaine Abuse Will need counseling when able to communicate effectively  DVT Prophylaxis: Subcutaneous heparin    Code Status: Full code  Family Communication: No family at bedside  Disposition Plan: not stable for discharge     LOS: 22 days   [place] [facility] Pager [phone] 12/23/2016, 12:26 PM  If 7PM-7AM, please contact night-coverage at [email or web address], password [facility]

## 2016-12-23 NOTE — Progress Notes (Signed)
Inpatient Diabetes Program Recommendations  AACE/ADA: New Consensus Statement on Inpatient Glycemic Control (2015)  Target Ranges:  Prepandial:   less than 140 mg/dL      Peak postprandial:   less than 180 mg/dL (1-2 hours)      Critically ill patients:  140 - 180 mg/dL   Results for Lawrence Kane, Lawrence Kane (MRN [id number]) as of 12/23/2016 09:02  Ref. Range 12/22/2016 07:54 12/22/2016 11:30 12/22/2016 15:55 12/22/2016 20:12 12/23/2016 00:08 12/23/2016 04:50 12/23/2016 08:06  Glucose-Capillary Latest Ref Range: 65 - 99 mg/dL 147170 (H) 829176 (H) 562130 (H) 240 (H) 349 (H) 251 (H) 253 (H)   Review of Glycemic Control  Current orders for Inpatient glycemic control: Lantus 20 units BID, Novolog 0-15 units Q4H  Inpatient Diabetes Program Recommendations: Insulin - Basal: In reviewing chart, noted patient did NOT receive any Lantus yesterday morning and as a result glucose up to 349 mg/dl by 13:0800:08 on 6/57/842/15/18. Patient did receive evening dose of Lantus 20 units at 00:06 on 12/23/16. Do not recommend making any changes with Lantus at this time as not able to determine effect of Lantus 20 units BID yet since patient only recieved evening dose last night.  Thanks, Orlando PennerMarie Riordan Walle, RN, MSN, CDE Diabetes Coordinator Inpatient Diabetes Program [phone] (Team Pager from 8am to 5pm)

## 2016-12-23 NOTE — Progress Notes (Addendum)
Patient has been calm and cooperative so informed Dr. Susie CassetteAbrol and will discontinue restraints at this time.  Upon entering room, patient had removed NG tube again. Notified Dr Susie CassetteAbrol and [facility] team.

## 2016-12-23 NOTE — Progress Notes (Signed)
Pharmacy Antibiotic Note  Lawrence Kane is a 51 y.o. 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 Patient had been on merrem and vancomycin previously on this admission and with stable renal function, will resume previous doses. CXR revealed worsening bilateral lower lobe multilobar bronchopneumonia.   Plan: Vancomycin 1250 mg IV q8h Meropenem 2g IV q8h Continue Acyclovir 1000 mg IV q8h Monitor cultures, renal function and clinical progression  Height: 6\' 4"  (193 cm) Weight: 198 lb 10.2 oz (90.1 kg) IBW/kg (Calculated) : 86.8  Temp (24hrs), Avg:98 F (36.7 C), Min:97.7 F (36.5 C), Max:98.2 F (36.8 C)   Recent Labs Lab 12/18/16 0328 12/19/16 0910 12/20/16 0926 12/21/16 0233 12/22/16 0730 12/23/16 0447  WBC 8.4 9.2  --  12.5* 11.2* 9.5  CREATININE 1.03 1.16 0.91 0.90 0.78 0.87    Estimated Creatinine Clearance: 123.3 mL/min (by C-G formula based on SCr of 0.87 mg/dL).    Allergies  Allergen Reactions  . Iodine   . Ketorolac     unknown  . Tylenol [Acetaminophen]     unknown    Antimicrobials this admission: Ceftriaxone 1/24 (started at OSH) >> 1/30 Vancomycin 1/24 (started at OSH) >> 1/30 Ampicillin 1/25 >>1/27 Acyclovir 1/25 >>1/26; 1/27>> Bactrim 1/25 (PCP prophylaxis) >> 1/29 Meropenem 1/30 >> 2/1  2/2 >> Ciprofloxacin 2/1 >> 2/2 PCN 2/1 >>2/2  Arlean Hoppingorey M. Newman PiesBall, PharmD, BCPS Clinical Pharmacist [phone] 12/23/2016 3:04 PM

## 2016-12-23 NOTE — Progress Notes (Signed)
Received verbal order from Dr. Susie CassetteAbrol to renew restraints order.

## 2016-12-23 NOTE — Progress Notes (Signed)
Patient transferred from ICU this evening. Lying, with eyes wide open, patient unable to talk due to the tracheostomy. Has heels in boots. Currently in safety restraints. NG in Right nare, Jeivity 1.2 infusing at 75 cc /hr. Has a fentanyl patch (50 mcg) on R upper arm.  Small scab on both knees, open to air.  Foley catheter in place, as well as rectal tube.  Patient has wound to the left side of scrotum, covered with gauze. Genital warts seen on penis.Emotional support given to patient. Bed alarm is on. Safety maintained.

## 2016-12-23 NOTE — Progress Notes (Signed)
Paged Dr Susie CassetteAbrol re: abd xray results and tube placement. New orders given and wants [facility] team to reeval tube placement in am.  Zebadiah Willert, Kae HellerMiranda Lynn, RN

## 2016-12-23 NOTE — Progress Notes (Signed)
Upon entering room at shift change, noted patient's NG tube had been pulled out of nare. Paged [facility] for reinsertion.

## 2016-12-24 ENCOUNTER — Inpatient Hospital Stay (HOSPITAL_COMMUNITY): Payer: Medicaid Other

## 2016-12-24 LAB — CBC
HCT: 34.9 % — ABNORMAL LOW (ref 39.0–52.0)
HEMOGLOBIN: 11 g/dL — AB (ref 13.0–17.0)
MCH: 30.2 pg (ref 26.0–34.0)
MCHC: 31.5 g/dL (ref 30.0–36.0)
MCV: 95.9 fL (ref 78.0–100.0)
PLATELETS: 362 10*3/uL (ref 150–400)
RBC: 3.64 MIL/uL — ABNORMAL LOW (ref 4.22–5.81)
RDW: 17.2 % — AB (ref 11.5–15.5)
WBC: 9.4 10*3/uL (ref 4.0–10.5)

## 2016-12-24 LAB — GLUCOSE, CAPILLARY
GLUCOSE-CAPILLARY: 103 mg/dL — AB (ref 65–99)
GLUCOSE-CAPILLARY: 124 mg/dL — AB (ref 65–99)
Glucose-Capillary: 193 mg/dL — ABNORMAL HIGH (ref 65–99)
Glucose-Capillary: 167 mg/dL — ABNORMAL HIGH (ref 65–99)
Glucose-Capillary: 103 mg/dL — ABNORMAL HIGH (ref 65–99)
Glucose-Capillary: 132 mg/dL — ABNORMAL HIGH (ref 65–99)

## 2016-12-24 MED ORDER — CLONAZEPAM 0.5 MG PO TABS
0.5000 mg | ORAL_TABLET | Freq: Three times a day (TID) | ORAL | Status: DC
  Administered 2016-12-27: 0.5 mg
  Filled 2016-12-24 (×2): qty 1

## 2016-12-24 NOTE — Evaluation (Signed)
Clinical/Bedside Swallow Evaluation Patient Details  Name: Lawrence Kane MRN: [id number] Date of Birth: 05-08-1966  Today's Date: 12/24/2016 Time: SLP Start Time (ACUTE ONLY): 0931 SLP Stop Time (ACUTE ONLY): 0946 SLP Time Calculation (min) (ACUTE ONLY): 15 min  Past Medical History:  Past Medical History:  Diagnosis Date  . COPD (chronic obstructive pulmonary disease) (HCC)   . Genital warts   . HIV (human immunodeficiency virus infection) (HCC)   . Osteoporosis    Past Surgical History:  Past Surgical History:  Procedure Laterality Date  . PROSTATE SURGERY  11/17/2016   HPI:  51 year old man with HIV/AIDS (first diagnosed 2003, COPD, and cocaine substance abuse, recent surgery in [place] (?) who [facility] on 1/[facility] with complaint of altered mental status. Found to be hypertensive to 240/112. 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   Assessment / Plan / Recommendation Clinical Impression  Lawrence Kane tolerating PMSV following evaluation was alert without audible vocalizations during bedside swallow evaluation. Generalized oral motor weakness.  Observed with thin liquids via cup/straw and puree solids which resulted in immediate and delayed coughing consistently throughout the eval. Lawrence Kane required verbal cues for small sips/bites and slow rate due to impulsivity. Due to prolonged intubation, trach, and coughing at bedside with POs, there is risk for aspiration. Advised pt and RN that an instrumental swallow evaluation (MBS) would be beneficial to assess pt's swallowing function. Plan to complete MBS as soon able. Recommended remaining NPO and meds via alternative means.     Aspiration Risk  Moderate aspiration risk;Severe aspiration risk    Diet Recommendation NPO   Medication Administration: Via alternative means     Other  Recommendations Oral Care Recommendations: Oral care QID   Follow up Recommendations Other (comment) (TBD)      Frequency and Duration min 2x/week  2 weeks       Prognosis Prognosis for Safe Diet Advancement: Good Barriers to Reach Goals: Severity of deficits      Swallow Study   General HPI: 51 year old man with HIV/AIDS (first diagnosed 2003, COPD, and cocaine substance abuse, recent surgery in [place] (?) who [facility] with complaint of altered mental status. Found to be hypertensive to 240/112. 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 Type of Study: Bedside Swallow Evaluation Previous Swallow Assessment:  (none) Diet Prior to this Study: NPO Temperature Spikes Noted: No Respiratory Status: Trach Collar History of Recent Intubation: Yes Length of Intubations (days): 13 days Date extubated:  (trach 2/5) Behavior/Cognition: Alert;Requires cueing;Cooperative Oral Cavity Assessment: Dried secretions Oral Care Completed by SLP: Yes Oral Cavity - Dentition: Adequate natural dentition Vision: Functional for self-feeding Self-Feeding Abilities: Able to feed self Patient Positioning: Upright in bed Baseline Vocal Quality: Aphonic (wearing valve without audible vocalizations) Volitional Cough: Congested;Weak (reflexive is strong, not volitional ) Volitional Swallow: Unable to elicit    Oral/Motor/Sensory Function Overall Oral Motor/Sensory Function: Generalized oral weakness (decreased labial protrusion) Facial ROM:  (decreased protrusion)   Ice Chips Ice chips: Not tested   Thin Liquid Thin Liquid: Impaired Presentation: Cup;Straw Oral Phase Impairments:  (none) Oral Phase Functional Implications:  (none) Pharyngeal  Phase Impairments: Cough - Immediate;Cough - Delayed    Nectar Thick Nectar Thick  Liquid: Not tested   Honey Thick Honey  Thick Liquid: Not tested   Puree Puree: Impaired Presentation: Spoon Oral Phase Impairments:  (none) Oral Phase Functional Implications:  (none) Pharyngeal Phase Impairments: Cough - Immediate;Cough - Delayed   Solid   GO   Solid: Not tested        [name] , Student-SLP 12/24/2016,12:07 PM

## 2016-12-24 NOTE — Progress Notes (Signed)
No charge note  Palliative Medicine consult noted. Due to high referral volume, there may be a delay seeing this patient. Please call the Palliative Medicine Team office at [phone] if recommendations are needed in the interim.  Thank you for inviting us to see this patient.  Codey Burling York, PA-C Palliative Medicine [phone]   

## 2016-12-24 NOTE — Progress Notes (Signed)
Pt BP was running high iv hydralazine 10mg  given according to the set parameter will continue to monitor pt

## 2016-12-24 NOTE — Consult Note (Signed)
WOC Nurse wound consult note Reason for Consult:  scrotum Wound type: patient admitted 12/01/16 with prior surgical intervention to the left side of his scrotum at and outside facility Hx. Meningitis/HIV+.  At that time WOC assessed area which was clean and a similar size as it is now but now the area is deeper with odor and the head of the penis has wart like growths that area much worse than at the time of admission Pressure Injury POA: No Measurement:unable to measure area due to location and loose skin making it difficult Wound bed: ruddy, pink, moist.  Yellow growth on the penile head Drainage (amount, consistency, odor) thick, with odor Periwound: various wart like lesion, patient with history of genital warts known at the time of admission Dressing procedure/placement/frequency: Current orders for silver hydrofiber will aid in drainage management and possible address increasing odor. However no dressing in place when I arrived.  I have discussed with the bedside nurse to place dressing.  Will ask hospitalist to consider urology evaluation since the area has worsened and the tip of the penis has new lesions.  Discussed POC with patient and bedside nurse.  Re consult if needed, will not follow at this time. Thanks  Magnum Lunde M.D.C. [employer]in MSN, RN,CWOCN, CNS [phone])

## 2016-12-24 NOTE — Progress Notes (Signed)
Physical Therapy Treatment Patient Details Name: Lawrence Kane MRN: [id number] DOB: 1966/06/06 Today's Date: 12/24/2016    History of Present Illness 51 yo admitted to [facility] on 1/24 with AMS and intubated. 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 Pt with bil LE flaccid since admission. PMHx:HIV/AIDS, Hep C, Hep B, COPD, cocaine use     PT Comments    Prior to bed mobility, SPTA noticed blood between patient's thighs. Notified RN who examined and cleared patient for sitting EOB; stated they were going to notify MD. Pt able to assist with UE's during rolling and bed mobility but unable to initiate LE sequencing. Able to sit EOB for 5 min with min A-max A at times with cuing for upright head posture. Pt follows one step commands well but unable to verbally communicate due to trach collar. Unable to safely progress patient with any transfers due to Bil LE flaccidity. Repositioned pt to R hip at end of session to prevent skin breakdown due to being on L hip upon arrival. PT continues to recommend CIR to progress patient's safety with functional mobility to decrease burden on caregiver   Follow Up Recommendations  CIR     Equipment Recommendations  Wheelchair (measurements PT);Wheelchair cushion (measurements PT);Hospital bed    Recommendations for Other Services       Precautions / Restrictions Precautions Precautions: Fall Precaution Comments: Trach collar, panda, bil wrist retraints, mittens, and Bil LEs flaccid Restrictions Weight Bearing Restrictions: No    Mobility  Bed Mobility Overal bed mobility: Needs Assistance Bed Mobility: Rolling Rolling: +2 for physical assistance;Max assist   Supine to sit: Max assist;+2 for physical assistance;HOB elevated Sit to supine: Max assist;+2 for physical assistance   General bed mobility comments: Max +2 for line management,  trunk elevation and LE sequencing into sitting; Pt unable to participate with Bil LE sequencing due to flaccidity but able to use Bil UEs on bed rails to assist and cuing for hand placement and focusing on task when coming into sitting.  Max +2 for rolling to safely change pt's soiled sheets.   Transfers                 General transfer comment: unsafe to attempt at this time  Ambulation/Gait                 Stairs            Wheelchair Mobility    Modified Rankin (Stroke Patients Only) Modified Rankin (Stroke Patients Only) Pre-Morbid Rankin Score: No symptoms Modified Rankin: Severe disability     Balance Overall balance assessment: Needs assistance Sitting-balance support: Bilateral upper extremity supported;Feet supported Sitting balance-Leahy Scale: Poor Sitting balance - Comments: Static sitting x 5 min; Pt leans posteriorly when sitting on EOB. Attempts to assist with stability using Bil UE's on bed but still requires between min A- max A to maintain balance. Cuing for upright head posture Postural control: Posterior lean                          Cognition Arousal/Alertness: Awake/alert Behavior During Therapy: WFL for tasks assessed/performed Overall Cognitive Status: Difficult to assess                 General Comments: Pt able to follow one step commands and actively participates with mobility. Pt unable to verbalize due to trach, but does  nod head in response to questions    Exercises      General Comments        Pertinent Vitals/Pain Pain Assessment: Faces Faces Pain Scale: Hurts little more Pain Location: scrotum Pain Descriptors / Indicators: Discomfort Pain Intervention(s): Repositioned;Monitored during session    Home Living                      Prior Function            PT Goals (current goals can now be found in the care plan section) Acute Rehab PT Goals Patient Stated Goal: pt unable to  state Potential to Achieve Goals: Fair Progress towards PT goals: Progressing toward goals    Frequency    Min 3X/week      PT Plan Current plan remains appropriate    Co-evaluation             End of Session Equipment Utilized During Treatment: Oxygen (trach collar) Activity Tolerance: Patient tolerated treatment well;Other (comment) (limited by flaccid LE's) Patient left: in bed;with call bell/phone within reach;with bed alarm set;with restraints reapplied     Time: 1228-1310 PT Time Calculation (min) (ACUTE ONLY): 42 min  Charges:  $Therapeutic Activity: 38-52 mins                    G Codes:      [facility] Hospital[facility] Moody Lucine Bilski 12/24/2016, 2:09 PM Kerrin MoMary M Irish Breisch, [place] Pager [phone]

## 2016-12-24 NOTE — Progress Notes (Signed)
Pt ativan that was scheduled at 0200 was not given pt was looking drowsy from the previous one given I will continue to monitor pt

## 2016-12-24 NOTE — Progress Notes (Signed)
CSW following for eventual SNF placement- at this time too many barriers to placement:  -requiring too much suctioning (per RN q 2-3 hours)- cannot need more than q4 hours to go to SNF -trach needs to be changed to cuffless -needs stable feeding source -needs to be off restraints for 24 hours  CSW is continuing to follow and will assist with placement when closer to [place]  Burna SisJenna H. Paislei Dorval, LCSW Clinical Social Worker [phone]

## 2016-12-24 NOTE — Progress Notes (Signed)
[facility] PROGRESS NOTE  JAK HAGGAR OMV:[id number] DOB: November 03, 1966 DOA: 12/01/2016  PCP: No primary care provider on file.  Brief History/Interval Summary: 51 year old man with HIV/AIDS (first diagnosed 2003, last CD4 count 245 with viremia of 1,160 in October 2017; on Genovya, Darunavir and Bactrim), Genital Warts, COPD, and cocaine substance abuse who presented to [facility] on 1/24 with complaint of altered mental status. Per notes, patient may have recently had some type of surgery in [place] where his mother resides. Afebrile, tachycardic to 155, RR 18, hypertensive to 240/112. Patient was intubated for airway protection, CVL in IJ was placed, and LP was performed given his altered mental status. ID was consulted. Patient was placed on antibiotics. He subsequently had to undergo tracheostomy. Subsequently, patient developed an ileus. NG tube was placed to intermittent suction.  Patient has pulled out his NG tube multiple times this admission. Still unable to swallow. Speech therapy consultation is pending  Reason for Visit: Meningitis. Transverse myelitis. Ileus.  Assessment/Plan:    Acute Hypoxemic Respiratory Failure , Tracheostomy 2/5 This was secondary to HCAP ESBL Klebsiella LLL+ sepsis + Unable to protect airway + pulm edema. Patient was on the ventilator for a prolonged duration. Subsequently underwent tracheostomy placement on 2/5. Pulmonology is following. Patient is off of the Precedex infusion.   Was on trach collar all day yesterday and tolerated it well. Starting to have mucopurulent secretions, patient received multiple courses of antibiotic between 1/24-2/7. Chest x-ray 2/15 showed worsening bronchopneumonia. Started patient on Robitussin. 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 HSV positive on CSF from [facility]. Infectious disease has been following. Patient is on acyclovir. He will need this for total of 21 days. Will complete treatment on 2/17.  Also on Bactrim for PCP prophylaxis.  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 Patient is on acyclovir. High-dose steroids 1000 mg/day , given for 5 days. Now on low-dose steroid. This will need to be slowly tapered down. Neurology has seen the patient. On fentanyl patch for pain issues. Started during this hospitalization.Re-image spine in 2-3 weeks to assess for improvement. Neurology signed off 2/12.  Ileus Improved. Patient had NG tube placed 2/8. Tolerating tube feeding. Okay to take about NG tube. Increase rate of tube feeding. Continue laxatives.   Hyperglycemia Unknown if patient has a history of diabetes. HbA1c 6.6 on 1/26. CBGs were extremely elevated due to steroids. He was initially placed on Lantus. However, CBGs were not adequately controlled. So, started on insulin infusion. Patient transitioned to Lantus on 2/12.    Bradycardia Heart rate has improved. Bradycardia was most likely due to medication such as versed. TSH is normal. Continue to monitor.   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 On aspirin. Echocardiogram shows normal systolic function. LDL 93. PT and OT evaluation. No significant carotid stenosis noted on MRA neck. She was seen by neurology. They had mentioned that a TEE could be considered once the patient was more stable. This can be pursued at a later date, although embolic stroke appeared to be less of a possibility based on his  clinical scenario.  Pulmonary edema Seems to have improved.  Continues to be on IV Lasix, which will be continued for now.   Moderate to Severe Protein Calorie Malnutrition  Feedings on hold due to ileus. Anticipate resuming 2/12.  Hypernatremia Free water to continue. Increase to every 6 hours. Repeat labs tomorrow. Tube feedings reinitiated. Sodium level should come down.  Transaminitis Worsened by seroquel likely which was discontinued on 1/31. Improved.   HCV ab+, Chronic hep b Management will be pursued as outpatient.  Normocytic anemia Hemoglobin stable. No evidence for overt bleeding. Continue to monitor.  LLL PNA with ESBL Klebsiella  Completed treatment. Monitor WBC.  HIV On antiretroviral treatment. Infectious disease was following.  Metabolic Encephalopathy Continue to monitor. Seems to be stable. PRN Sedative agents for agitation. Restraints in place to prevent him from pulling out supportive tubes.  History of Cocaine Abuse Will need counseling when able to communicate effectively    Consultants: Infectious disease. Critical care medicine. Neurology.  STUDIES:  1/24 CXR >> ATX vs LLL infiltrate 1/25 CT head >> neg for acute intracranial process 1/25 LP cytology >> no malignant cells 1/25 LP >> yellow, cloudy, glucose 20, total protein >600, RBC 114>49, WBC 970, 36% neutrophil, 20% lymphs, 19% eos 1/28 BAL cell count >> 266 WBC, eos 1 1/28 BAL cytology >> no malignant cells 1/27 BAL for wet mount> neg for parasites 1/27 MRI brain >> small acute infarcts in cerebellum and cerebral cortex, sucal signal 1/29 LP >> Glucose 82, protein >600, WBC 600>885, eosinophils 28% 1/29 Echo >> LV EF 60-65%, indeterminent diastolic function, wall motion normal 1/30 EEG >> diffuse slowing and background suppression, no seizures 2/3 MRI thoracic/lumbar/cervical >> (-) abscess. Meningitis. R/O CMV   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 1/24 Cryptococcal [name]): negative 1/24  CSF [facility]): NGTD 1/22 scrotal wound cx [name]): MRSA Toxoplasma [name]): Negative 1/25BCx x2 : NGTD 1/25 HCV ab + 1/24 Trach asp 1/24: NGTD 1/24MRSA PCR: MRSA pos 1/25 HIV quant: 80, CD4 10 1/25 CSF AFB: Smear neg, >> 1/25 CSF fungal cx: NGTD 1/25 CSF crypto ag: neg 1/25 CSF gram stain no organisms, cx: NGTD 1/27 strongyloides ab >> negative 1/27 coccidiomycosis ab >> 1/28 BAL AFB smear/cx> 1/28 BAL Fungus cx >> 1/28 BAL gram stain/Cx: 40K ESBL Klebsiella pneumoniae 1/28 BAL pneumocystis smear: Neg 1/27 O&P >> 1/29 CSF Cx >> no organisms on gram stain, >> NGTD 2/04 CSF CMV >> not done?   ANTIBIOTICS: Rocephin 1/24>>1/30 Vancomycin 1/24>>1/30; 2/2 >  Acyclovir 1/24>> 1/26, 1/27>> Ampicillin 1/24>> 1/27 Bactrim M/W/F for PCP prophylaxis Meropenem 1/30>> 2/1; 2/2 >  Pen G 2/1 >> 2/2 Cipro 2/1 >> 2/2 Meropenem/vancomycin-2/15-  SIGNIFICANT EVENTS: 1/24  Transferred from [facility] 1/25  Underwent LP here 2/05  Trach 2/06  To ATC 2/8  patient developed ileus  LINES/TUBES: L IJ CVL 1/24 >> ETT 1/24 >> 2/5 Foley 1/24 >> OGT 1/24 >> 2/5 Trach (df) 2/5 >>   Subjective/Interval History: Patient states that his secretions and his breathing are better today than they were yesterday   ROS: Unable to do.  Objective:  Vital Signs  Vitals:   12/24/16 0414 12/24/16 0420 12/24/16 0640 12/24/16 0910  BP: (!) 176/105  (!) 149/101   Pulse: 79  90 88  Resp: 18   (!) 22  Temp: 98.4 F (36.9 C)     TempSrc: Oral     SpO2: 94%   100%  Weight:  88.7 kg (  195 lb 8.8 oz)    Height:        Intake/Output Summary (Last 24 hours) at 12/24/16 0920 Last data filed at 12/24/16 0622  Gross per 24 hour  Intake                0 ml  Output             1750 ml  Net            -1750 ml   Filed Weights   12/21/16 0500 12/22/16 0413 12/24/16 0420  Weight: 92.4 kg (203 lb 11.3 oz) 90.1 kg (198 lb 10.2 oz) 88.7 kg (195 lb 8.8 oz)    General appearance: Patient is  awake and alert. Neck: Tracheostomy noted Resp: Good air entry bilaterally. No wheezing, rales or rhonchi. diminished at the bases.  Cardio: S1, S2, is bradycardic, regular. No S3, S4. No rubs, murmurs, or bruit. Minimal pedal edema.  GI: Abdomen remains soft. Not distended. Bowel sounds are present. No masses or organomegaly. Nontender.  Extremities: Minimal edema noted bilateral lower extremities Pulses: 2+ and symmetric Neurologic: Patient is awake and alert. Moving his upper extremities. No movement noted in the lower extremities.  Lab Results:  Data Reviewed: I have personally reviewed following labs and imaging studies  CBC:  Recent Labs Lab 12/19/16 0910 12/21/16 0233 12/22/16 0730 12/23/16 0447 12/24/16 0402  WBC 9.2 12.5* 11.2* 9.5 9.4  HGB 9.4* 10.1* 10.4* 10.7* 11.0*  HCT 30.9* 33.1* 34.2* 35.3* 34.9*  MCV 97.8 98.2 98.0 98.6 95.9  PLT 396 377 384 384 800    Basic Metabolic Panel:  Recent Labs Lab 12/19/16 0910 12/20/16 0926 12/21/16 0233 12/22/16 0730 12/23/16 0447  NA 149* 151* 152* 150* 144  K 3.5 4.0 4.2 4.6 4.5  CL 113* 119* 118* 116* 115*  CO2 _0 GLUCOSE 217* 157* 226* 201* 277*  BUN 51* 44* 41* 36* 37*  CREATININE 1.16 0.91 0.90 0.78 0.87  CALCIUM 9.3 9.4 9.2 9.3 9.1  MG 2.5*  --   --   --   --     GFR: Estimated Creatinine Clearance: 123.3 mL/min (by C-G formula based on SCr of 0.87 mg/dL).  Liver Function Tests:  Recent Labs Lab 12/19/16 0910 12/23/16 0447  AST 40 54*  ALT 88* 90*  ALKPHOS 61 61  BILITOT 0.5 0.6  PROT 7.9 7.3  ALBUMIN 2.0* 2.1*     Coagulation Profile: No results for input(s): INR, PROTIME in the last 168 hours.  CBG:  Recent Labs Lab 12/23/16 1928 12/23/16 2357 12/24/16 0103 12/24/16 0412 12/24/16 0819  GLUCAP 101* 179* 193* 167* 103*     No results found for this or any previous visit (from the past 240 hour(s)).    Radiology Studies: Dg Abd 1 View  Result Date:  12/23/2016 CLINICAL DATA:  Feeding tube placement EXAM: ABDOMEN - 1 VIEW COMPARISON:  December 22, 2016 FINDINGS: Feeding tube tip is at the junction of the second and third portions of the duodenum. Bowel gas pattern is normal. No bowel obstruction. No free air. Moderate stool is present in the colon. IMPRESSION: Feeding tube tip at junction of second and third portions of duodenum. Bowel gas pattern normal. Electronically Signed   By: Lowella Grip III M.D.   On: 12/23/2016 13:45   Dg Chest Port 1 View  Result Date: 12/23/2016 CLINICAL DATA:  51 year old male with history of cough. EXAM: PORTABLE CHEST 1 VIEW COMPARISON:  Chest x-ray 12/19/2016. FINDINGS: Worsening patchy multifocal airspace disease in the left mid to lower lung and at the right lung base, concerning for progressive multilobar bronchopneumonia. Probable small left pleural effusion appears slightly increased compared to the prior study. No definite right pleural effusion. No evidence of pulmonary edema. Heart size appears borderline to mildly enlarged. Upper mediastinal contours are within normal limits. Feeding tube extends into the stomach, but the tip is below the lower margin of the images. Previously noted nasogastric tube has been removed. Tracheostomy tube in place with tip terminating 5.8 cm above the carina. IMPRESSION: 1. Worsening bilateral lower lobe multilobar bronchopneumonia (left greater than right) with small left parapneumonic pleural effusion. 2. Support apparatus, as above. Electronically Signed   By: Vinnie Langton M.D.   On: 12/23/2016 13:20   Dg Abd Portable 1v  Result Date: 12/23/2016 CLINICAL DATA:  Enteric tube placement EXAM: PORTABLE ABDOMEN - 1 VIEW COMPARISON:  Abdominal radiograph from earlier today FINDINGS: Weighted enteric tube terminates in the right upper quadrant of the abdomen in the region of the pylorus. No dilated small bowel loops. No evidence of pneumatosis or pneumoperitoneum. Patchy left  lung base opacity. IMPRESSION: Weighted enteric tube terminates in the right upper quadrant in the region of the pylorus. Nonobstructive bowel gas pattern. Patchy left lung base opacity. Electronically Signed   By: Ilona Sorrel M.D.   On: 12/23/2016 17:19   Dg Abd Portable 1v  Result Date: 12/22/2016 CLINICAL DATA:  Assess feeding tube position. EXAM: PORTABLE ABDOMEN - 1 VIEW COMPARISON:  KUB of December 20, 2016 FINDINGS: The radiodense tip of the feeding tube lies in the region of the pylorus or first portion of the duodenum. There is some looping of the feeding tube in the gastric cardia. The bowel gas pattern is within the limits of normal. The bony structures exhibit no acute abnormalities. IMPRESSION: There has been distal migration of the feeding tube since at the tip lies in the region of the pylorus or first portion of the duodenum. Electronically Signed   By: David  Martinique M.D.   On: 12/22/2016 15:31     Medications:  Scheduled: . small volume/piggyback builder   Intravenous Q8H  . aspirin  325 mg Per Tube Daily  . chlorhexidine gluconate (MEDLINE KIT)  15 mL Mouth Rinse BID  . clonazePAM  2 mg Per Tube TID  . darunavir-cobicistat  1 tablet Oral Q breakfast  . dolutegravir  50 mg Oral Daily  . emtricitabine-tenofovir AF  1 tablet Per Tube Daily  . famotidine  20 mg Per Tube BID  . feeding supplement (PRO-STAT SUGAR FREE 64)  30 mL Per Tube BID  . fentaNYL  50 mcg Transdermal Q72H  . free water  200 mL Per Tube Q6H  . heparin  5,000 Units Subcutaneous Q8H  . insulin aspart  0-15 Units Subcutaneous Q4H  . insulin glargine  22 Units Subcutaneous BID  . lactulose  10 g Per Tube BID  . LORazepam  2 mg Intravenous Q6H  . mouth rinse  15 mL Mouth Rinse QID  . meropenem (MERREM) IV  2 g Intravenous Q8H  . methylPREDNISolone (SOLU-MEDROL) injection  20 mg Intravenous Q12H   Followed by  . [START ON 12/26/2016] methylPREDNISolone (SOLU-MEDROL) injection  20 mg Intravenous Q24H    Followed by  . [START ON 12/29/2016] methylPREDNISolone (SOLU-MEDROL) injection  20 mg Intravenous QODAY  . scopolamine  1 patch Transdermal Q72H  . sulfamethoxazole-trimethoprim  20 mL Per Tube Daily  .  vancomycin  1,250 mg Intravenous Q8H   Continuous: . feeding supplement (JEVITY 1.2 CAL) 1,000 mL (12/22/16 1848)   GMW:NUUVOZ chloride, acetaminophen, chlorproMAZINE (THORAZINE) IV, docusate, fentaNYL (SUBLIMAZE) injection, guaiFENesin-dextromethorphan, haloperidol lactate, hydrALAZINE, sennosides      DVT Prophylaxis: Subcutaneous heparin    Code Status: Full code  Family Communication: No family at bedside  Disposition Plan: not stable for discharge     LOS: 23 days   [facility] Pager [phone] 12/24/2016, 9:20 AM  If 7PM-7AM, please contact night-coverage at [email or web address], password [facility]

## 2016-12-24 NOTE — Evaluation (Signed)
[facility]y-[facility] Speaking Valve - Evaluation Patient Details  Name: Lawrence NorthDarren E Muma MRN: [id number] Date of Birth: [birth year]  Today's Date: 12/24/2016 Time: 0906-0930 SLP Time Calculation (min) (ACUTE ONLY): 24 min  Past Medical History:  Past Medical History:  Diagnosis Date  . COPD (chronic obstructive pulmonary disease) (HCC)   . Genital warts   . HIV (human immunodeficiency virus infection) (HCC)   . Osteoporosis    Past Surgical History:  Past Surgical History:  Procedure Laterality Date  . PROSTATE SURGERY  11/17/2016   HPI:  51 year old man with HIV/AIDS (first diagnosed 2003, COPD, and cocaine substance abuse, [place] who [facility] with complaint of altered mental status. Found to be hypertensive to 240/112. 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   Assessment / Plan / Recommendation Clinical Impression  Pt exhibited a strong reflexive but weak volitional cough and expectorated mucous via trach. Valve remained on trach hub for 20 minutes; no cough, no increased dyspnea and no evidence of air trapping/back pressure. He was unable to achieve vocal cord adduction given verbal cues and demonstration for deep inhalation coordinated with vocalization. Pt's RR, HR and SpO2 were within normal range. Recommend pt wear valve during all waking hours, must REMOVE at night during sleep/napping. ST will continue to follow. Please see bedside swallow evaluation for results of swallow function.         SLP Assessment       Follow Up Recommendations  Other (comment) (TBD)    Frequency and Duration min 2x/week  2 weeks    PMSV Trial PMSV was placed for: 20 min Able to redirect subglottic air through upper airway: Yes Able to Attain Phonation: No Voice Quality: Other (comment) (unable to  assess) Able to Expectorate Secretions: Yes Level of Secretion Expectoration with PMSV: Tracheal Breath Support for Phonation: Moderately decreased Intelligibility: Unable to assess (comment) Respirations During Trial:  (WNL) SpO2 During Trial:  (97-100%) Pulse During Trial: 81 Behavior: Alert;Controlled   Tracheostomy Tube       Vent Dependency  FiO2 (%): 28 %    Cuff Deflation Trial  GO Tolerated Cuff Deflation: Yes Length of Time for Cuff Deflation Trial: 25 min Behavior: Alert;Responsive to questions;Controlled        Royce MacadamiaLitaker, Delynda Sepulveda Willis 12/24/2016, 2:12 PM   Breck CoonsLisa Willis Lonell FaceLitaker M.Ed [employer] Pager [phone]

## 2016-12-25 ENCOUNTER — Inpatient Hospital Stay (HOSPITAL_COMMUNITY): Payer: Medicaid Other

## 2016-12-25 DIAGNOSIS — Z515 Encounter for palliative care: Secondary | ICD-10-CM

## 2016-12-25 LAB — GLUCOSE, CAPILLARY
GLUCOSE-CAPILLARY: 117 mg/dL — AB (ref 65–99)
GLUCOSE-CAPILLARY: 122 mg/dL — AB (ref 65–99)
GLUCOSE-CAPILLARY: 89 mg/dL (ref 65–99)
GLUCOSE-CAPILLARY: 98 mg/dL (ref 65–99)
Glucose-Capillary: 104 mg/dL — ABNORMAL HIGH (ref 65–99)
Glucose-Capillary: 77 mg/dL (ref 65–99)

## 2016-12-25 LAB — BASIC METABOLIC PANEL
ANION GAP: 9 (ref 5–15)
BUN: 23 mg/dL — ABNORMAL HIGH (ref 6–20)
CHLORIDE: 107 mmol/L (ref 101–111)
CO2: 23 mmol/L (ref 22–32)
Calcium: 8.7 mg/dL — ABNORMAL LOW (ref 8.9–10.3)
Creatinine, Ser: 0.57 mg/dL — ABNORMAL LOW (ref 0.61–1.24)
GFR calc Af Amer: >60 mL/min (ref >60)
GFR calc non Af Amer: >60 mL/min (ref >60)
Glucose, Bld: 119 mg/dL — ABNORMAL HIGH (ref 65–99)
POTASSIUM: 3.7 mmol/L (ref 3.5–5.1)
SODIUM: 139 mmol/L (ref 135–145)

## 2016-12-25 MED ORDER — FLUCONAZOLE 40 MG/ML PO SUSR
200.0000 mg | Freq: Every day | ORAL | Status: DC
  Filled 2016-12-25 (×2): qty 5

## 2016-12-25 MED ORDER — DEXTROSE-NACL 5-0.9 % IV SOLN
INTRAVENOUS | Status: DC
  Administered 2016-12-25: 1000 mL via INTRAVENOUS

## 2016-12-25 NOTE — Progress Notes (Signed)
Received report from [place], RN at bedside.  At this time I noticed PMV was not in container. Searched room without finding it.  Informed charge nurse of this.Patient is restless, agitated and with no IV. Patient had pulled IV out. RN informed me that IV team consult was placed.

## 2016-12-25 NOTE — Progress Notes (Signed)
Paged DR Selena BattenKim regarding holding lantus tonight. Will await call back.

## 2016-12-25 NOTE — Consult Note (Signed)
Consultation Note Date: 12/25/2016   Patient Name: Lawrence Kane  DOB: Apr 20, 1966  MRN: [id number]  Age / Sex: 51 y.o., male  PCP: No primary care provider on file. Referring Physician: Reyne Dumas, MD  Reason for Consultation: Establishing goals of care in the setting of AMS, respiratory failure and meningitis  HPI/Patient Profile: 51 y.o. male  with past medical history of AIDS, COPD, Osteoporosis, and cocaine use who was admitted on 12/01/2016 with sepsis and acute encephalopathy.   He has had a complex hospital course including intubation, subsequent tracheostomy placement, ESBL pneumonia, and a scrotal wound.  He is now NPO due to being a high aspiration risk, and he is unable to effectively communicate either verbally or in writing.  Clinical Assessment and Goals of Care: I met with the patient at bedside.  He is alert but unable to speak or write.  His movements are slowed.  He seems frustrated.  He asks for food and water several times.  He indicates that he is not in pain.  Also his mother is his surrogate decision maker and he would like for me to call her.   I spoke with his mother Thaddaeus Granja on the phone.  She is in [place] and has had the flu for 3 weeks.  She tells me that Shayaan had an apartment and was independent prior to admission.  We discussed his hospitalization and where he may go after discharge.  Luellen Pucker would like to have her son placed at a SNF in [place] near her.    Luellen Pucker and her other son are coming to the hospital on Monday 2/19.   She would like to meet with his care providers and social worker on Monday if possible.  Primary Decision Maker:  NEXT OF KIN Mother, Luellen Pucker    SUMMARY OF RECOMMENDATIONS    Code Status/Advance Care Planning:  Full code    Symptom Management:   Per primary team.  Patient appears comfortable.   Palliative Prophylaxis:   Aspiration, Delirium  Protocol and Frequent Pain Assessment  Psycho-social/Spiritual:   Desire for further Chaplaincy support: unknown  Prognosis:   Unable to determine  Discharge Planning: To Be Determined  Likely SNF with Palliative follow up in [place]      Primary Diagnoses: Present on Admission: **None** Sepsis with acute encephalopathy  I have reviewed the medical record, interviewed the patient and family, and examined the patient. The following aspects are pertinent.  Past Medical History:  Diagnosis Date  . COPD (chronic obstructive pulmonary disease) ([place])   . Genital warts   . HIV (human immunodeficiency virus infection) ([place])   . Osteoporosis    Social History   Social History  . Marital status: Married    Spouse name: N/A  . Number of children: N/A  . Years of education: N/A   Social History Main Topics  . Smoking status: Current Every Day Smoker  . Smokeless tobacco: Never Used  . Alcohol use 0.6 oz/week    1 Cans of beer per week  .  Drug use: No  . Sexual activity: Not Asked   Other Topics Concern  . None   Social History Narrative  . None   History reviewed. No pertinent family history. Scheduled Meds: . aspirin  325 mg Per Tube Daily  . chlorhexidine gluconate (MEDLINE KIT)  15 mL Mouth Rinse BID  . clonazePAM  0.5 mg Per Tube TID  . darunavir-cobicistat  1 tablet Oral Q breakfast  . dolutegravir  50 mg Oral Daily  . emtricitabine-tenofovir AF  1 tablet Per Tube Daily  . famotidine  20 mg Per Tube BID  . feeding supplement (PRO-STAT SUGAR FREE 64)  30 mL Per Tube BID  . fentaNYL  50 mcg Transdermal Q72H  . fluconazole  200 mg Per Tube Daily  . free water  200 mL Per Tube Q6H  . heparin  5,000 Units Subcutaneous Q8H  . insulin aspart  0-15 Units Subcutaneous Q4H  . insulin glargine  22 Units Subcutaneous BID  . lactulose  10 g Per Tube BID  . LORazepam  2 mg Intravenous Q6H  . mouth rinse  15 mL Mouth Rinse QID  . meropenem (MERREM) IV  2 g Intravenous  Q8H  . methylPREDNISolone (SOLU-MEDROL) injection  20 mg Intravenous Q12H   Followed by  . [START ON 12/26/2016] methylPREDNISolone (SOLU-MEDROL) injection  20 mg Intravenous Q24H   Followed by  . [START ON 12/29/2016] methylPREDNISolone (SOLU-MEDROL) injection  20 mg Intravenous QODAY  . scopolamine  1 patch Transdermal Q72H  . sulfamethoxazole-trimethoprim  20 mL Per Tube Daily  . vancomycin  1,250 mg Intravenous Q8H   Continuous Infusions: . feeding supplement (JEVITY 1.2 CAL) Stopped (12/24/16 1832)   PRN Meds:.sodium chloride, acetaminophen, chlorproMAZINE (THORAZINE) IV, docusate, fentaNYL (SUBLIMAZE) injection, guaiFENesin-dextromethorphan, haloperidol lactate, hydrALAZINE, sennosides Allergies  Allergen Reactions  . Iodine   . Ketorolac     unknown  . Tylenol [Acetaminophen]     unknown   Review of Systems Denies pain, but otherwise unable to provide.  Physical Exam  Well developed male,  Tracheostomy in place, excess clear secretions attempting to mouth words to me. Movements appear slowed.  He is awake and sitting up but I am unable to assess mental status  Vital Signs: BP 136/89 (BP Location: Right Arm)   Pulse 68   Temp 97.9 F (36.6 C) (Oral)   Resp 18   Ht '6\' 4"'$  (1.93 m)   Wt 83.3 kg (183 lb 10.3 oz)   SpO2 96%   BMI 22.35 kg/m  Pain Assessment: PAINAD POSS *See Group Information*: 2-Acceptable,Slightly drowsy, easily aroused Pain Score: Asleep   SpO2: SpO2: 96 % O2 Device:SpO2: 96 % O2 Flow Rate: .O2 Flow Rate (L/min): 6 L/min  IO: Intake/output summary:  Intake/Output Summary (Last 24 hours) at 12/25/16 1451 Last data filed at 12/25/16 0516  Gross per 24 hour  Intake              924 ml  Output             1650 ml  Net             -726 ml    LBM: Last BM Date: 12/24/16 (This is what the day nurse told me. Evacuated in flexiseal) Baseline Weight: Weight: 93 kg (205 lb) Most recent weight: Weight: 83.3 kg (183 lb 10.3 oz)     Palliative  Assessment/Data:   Flowsheet Rows   Flowsheet Row Most Recent Value  Intake Tab  Referral Department  Hospitalist  Unit at Time of Referral  Med/Surg Unit  Palliative Care Primary Diagnosis  Sepsis/Infectious Disease  Date Notified  12/24/16  Palliative Care Type  New Palliative care  Reason for referral  Clarify Goals of Care  Date of Admission  12/01/16  Date first seen by Palliative Care  12/25/16  # of days Palliative referral response time  1 Day(s)  # of days IP prior to Palliative referral  23  Clinical Assessment  Palliative Performance Scale Score  30%  Psychosocial & Spiritual Assessment  Palliative Care Outcomes  Patient/Family meeting held?  Yes  Who was at the meeting?  patient and mother on the phone  Palliative Care Outcomes  Clarified goals of care     Time Total: 50 min. Greater than 50%  of this time was spent counseling and coordinating care related to the above assessment and plan.  Signed by: Imogene Burn, PA-C Palliative Medicine Pager: [phone]  Please contact Palliative Medicine Team phone at [phone] for questions and concerns.  For individual provider: See Shea Evans

## 2016-12-25 NOTE — Progress Notes (Signed)
SLP Cancellation Note  Patient Details Name: Patrick NorthDarren E Fearn MRN: [id number] DOB: 31-Oct-1966   Cancelled treatment:       Reason Eval/Treat Not Completed: Other (comment) (issues with coordinating transport with nursing). MBS was scheduled with speech therapy today, however there were issues in coordinating patient transport with nursing and so plan for MBS tomorrow (12/26/16).    [name], MA, CCC-SLP

## 2016-12-25 NOTE — Progress Notes (Addendum)
Speech Language Pathology Treatment: Lawrence Kane Speaking valve  Patient Details Name: Lawrence Kane MRN: [id number] DOB: 06-Jul-1966 Today's Date: 12/25/2016 Time: 1040-1100 SLP Time Calculation (min) (ACUTE ONLY): 20 min  Assessment / Plan / Recommendation Clinical Impression  Patient seen to address PMV toleration and discuss with patient plan for MBS which is scheduled for approximately 1100 today. Patient tolerated PMV with no acute s/s distress and was able to achieve phonation, although was weak and breathy and very difficult for SLP to understand even when ear right near patient's mouth. Voice was a whisper quality and require effort on patient's part. Patient became perseverative on trying to tell SLP what kinds of foods he likes.   HPI HPI: 51 year old man with HIV/AIDS (first diagnosed 2003, COPD, and cocaine substance abuse, recent surgery in [place] (?) who [facility] on 1/24 with complaint of altered mental status. Found to be hypertensive to 240/112. 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      SLP Plan    MBS to determine swallow function and PO readiness    Recommendations  Diet recommendations: NPO Medication Administration: Via alternative means                Follow up Recommendations: Other (comment) (TBD)       GO             Lawrence LawrenceJohn Tarrell Preston, MA, CCC-SLP

## 2016-12-25 NOTE — Progress Notes (Signed)
IV placed by nurse. Patients CBG has been declining throughout the day. Paged [facility] in reference to IV fluids.  Dr Selena BattenKim called and ordered D5 NS at 30cc/hr.

## 2016-12-25 NOTE — Progress Notes (Signed)
[facility] PROGRESS NOTE  Lawrence Kane KYH:[id number] DOB: 1965/11/20 DOA: 12/01/2016  PCP: No primary care provider on file.  Brief History/Interval Summary: 51 year old man with HIV/AIDS (first diagnosed 2003, last CD4 count 245 with viremia of 1,160 in October 2017; on Genovya, Darunavir and Bactrim), Genital Warts, COPD, and cocaine substance abuse who presented to [facility] on 1/24 with complaint of altered mental status. Per notes, patient may have recently had some type of surgery in [place] where his mother resides. Afebrile, tachycardic to 155, RR 18, hypertensive to 240/112. Patient was intubated for airway protection, CVL in IJ was placed, and LP was performed given his altered mental status. ID was consulted. Patient was placed on antibiotics. He subsequently had to undergo tracheostomy. Subsequently, patient developed an ileus. NG tube was placed to intermittent suction.  Patient has pulled out his NG tube multiple times this admission. Still unable to swallow. Speech therapy consultation is pending  Reason for Visit: Meningitis. Transverse myelitis. Ileus.  Assessment/Plan:  Acute Hypoxemic Respiratory Failure , Tracheostomy 2/5 This was secondary to HCAP ESBL Klebsiella LLL+ sepsis + Unable to protect airway + pulm edema. Patient was on the ventilator for a prolonged duration. Subsequently underwent tracheostomy placement on 2/5. Pulmonology is following. Patient is off of the Precedex infusion.   Was on trach collar all day yesterday and tolerated it well. Starting to have mucopurulent secretions, patient received multiple courses of antibiotic between 1/24-2/7. Chest x-ray 2/15 showed worsening bronchopneumonia. Started patient on Robitussin. Concern for recurrent pneumonia/tracheobronchitis remains . previous 1/28 BAL gram stain/Cx: 40K ESBL Klebsiella pneumoniae, therefore patient restarted on meropenem and vancomycin 2/15 Discussed with PCCM , , patient will  continue with cuff tracheostomy tube in case he needs to go back on the ventilator Proceed with establishing PO intake without replacing NG tube      Scrotal wound/history of genital warts Left scrotal area wound is deeper with odor and the head of the penis has wart like growths that area much worse than at the time of admission Started on fluconazole Periwound: various wart like lesion, patient with history of genital warts known at the time of admission May need urology consult Will first obtain records from outside facility   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 HSV positive on CSF from [facility]. Infectious disease has been following. Patient is on acyclovir. He will need this for total of 21 days. Will complete treatment on 2/17.  Also on Bactrim for PCP prophylaxis.  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 Patient is on acyclovir. High-dose steroids 1000 mg/day , given for 5 days. Now on low-dose steroid. This will need to be slowly tapered down. Neurology has seen the patient. On fentanyl patch for pain issues. Started during this hospitalization.Re-image spine in 2-3 weeks to assess for improvement. Neurology signed off 2/12.  Ileus Improved. Patient had NG tube placed 2/8. Patient has removed his NG several times.  Continue laxatives.   Hyperglycemia Unknown if patient has a history of diabetes. HbA1c 6.6 on 1/26. CBGs were extremely elevated due to steroids. He was initially placed on Lantus. However, CBGs were not adequately controlled. So, started on insulin infusion. Patient transitioned to Lantus on 2/12.    Bradycardia Heart rate has improved. Bradycardia was most likely due to medication such as versed. TSH is normal. Continue to monitor.   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 On aspirin. Echocardiogram shows normal systolic function. LDL 93. PT and OT evaluation. No significant carotid stenosis noted on MRA neck.  he was seen by neurology. They had mentioned that a TEE could be considered once the patient was more stable. This can be pursued at a later date, although embolic stroke appeared to be less of a possibility based on his clinical scenario.  Pulmonary edema Seems to have improved. Continues to be on IV Lasix, which will be continued for now.   Moderate to Severe Protein Calorie Malnutrition  Feedings on hold due to ileus/patient keeps pulling out NGT     Hypernatremia Free water to continue. Increase to every 6 hours. Repeat labs tomorrow. Tube feedings on hold. Sodium level should come down.  Transaminitis Worsened by seroquel likely which was discontinued on 1/31. Improved.   HCV ab+, Chronic hep b Management will be pursued as outpatient.  Normocytic anemia Hemoglobin stable. No evidence for overt bleeding. Continue to monitor.  LLL PNA with ESBL Klebsiella  Restarted antibiotics 2/15  HIV On antiretroviral treatment. Infectious disease was following.  Metabolic Encephalopathy Continue to monitor. Seems to be stable. PRN Sedative agents for agitation. Restraints in place to prevent him from pulling out supportive tubes.  History of Cocaine Abuse Will need counseling when able to communicate effectively    Consultants: Infectious disease. Critical care medicine. Neurology.  STUDIES:  1/24 CXR >> ATX vs LLL infiltrate 1/25 CT head >> neg for acute intracranial process 1/25 LP cytology >> no malignant cells 1/25 LP >> yellow, cloudy, glucose 20, total protein >600, RBC 114>49, WBC 970, 36% neutrophil, 20% lymphs, 19% eos 1/28 BAL cell count >> 266 WBC, eos 1 1/28 BAL cytology >> no malignant cells 1/27 BAL for wet mount> neg for parasites 1/27 MRI brain >> small acute infarcts in cerebellum and cerebral cortex,  sucal signal 1/29 LP >> Glucose 82, protein >600, WBC 600>885, eosinophils 28% 1/29 Echo >> LV EF 60-65%, indeterminent diastolic function, wall motion normal 1/30 EEG >> diffuse slowing and background suppression, no seizures 2/3 MRI thoracic/lumbar/cervical >> (-) abscess. Meningitis. R/O CMV   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 1/24 Cryptococcal [name]): negative 1/24 CSF [facility]): NGTD 1/22 scrotal wound cx [name]): MRSA Toxoplasma [name]): Negative 1/25BCx x2 : NGTD 1/25 HCV ab + 1/24 Trach asp 1/24: NGTD 1/24MRSA PCR: MRSA pos 1/25 HIV quant: 80, CD4 10 1/25 CSF AFB: Smear neg, >> 1/25 CSF fungal cx: NGTD 1/25 CSF crypto ag: neg 1/25 CSF gram stain no organisms, cx: NGTD 1/27 strongyloides ab >> negative 1/27 coccidiomycosis ab >> 1/28 BAL AFB smear/cx> 1/28 BAL Fungus cx >> 1/28 BAL gram stain/Cx: 40K ESBL Klebsiella pneumoniae 1/28 BAL pneumocystis smear: Neg 1/27 O&P >> 1/29 CSF Cx >> no organisms on gram stain, >> NGTD 2/04 CSF CMV >> not done?   ANTIBIOTICS: Rocephin 1/24>>1/30 Vancomycin 1/24>>1/30; 2/2 >  Acyclovir 1/24>> 1/26, 1/27>> Ampicillin 1/24>> 1/27 Bactrim M/W/F for PCP prophylaxis Meropenem 1/30>> 2/1; 2/2 >  Pen G 2/1 >> 2/2 Cipro 2/1 >> 2/2 Meropenem/vancomycin-2/15-  SIGNIFICANT EVENTS: 1/24  Transferred from [facility] 1/25  Underwent LP here 2/05  Trach 2/06  To ATC 2/8  patient developed ileus  LINES/TUBES: L IJ CVL 1/24 >> ETT 1/24 >> 2/5 Foley 1/24 >> OGT 1/24 >> 2/5 Trach (df) 2/5 >>   Subjective/Interval History: Still coughing up secretions through trach,fio2 stable  ROS: Unable to do.  Objective:  Vital Signs  Vitals:   12/24/16 2350 12/25/16 0500 12/25/16 0516 12/25/16 0829  BP:   136/89   Pulse: 71  88 68  Resp: _0 Temp:   97.9 F (36.6 C)   TempSrc:   Oral   SpO2: 98%  100% 98%  Weight:  83.3 kg (183 lb 10.3 oz)    Height:         Intake/Output Summary (Last 24 hours) at 12/25/16 0923 Last data filed at 12/25/16 0516  Gross per 24 hour  Intake              924 ml  Output             1650 ml  Net             -726 ml   Filed Weights   12/22/16 0413 12/24/16 0420 12/25/16 0500  Weight: 90.1 kg (198 lb 10.2 oz) 88.7 kg (195 lb 8.8 oz) 83.3 kg (183 lb 10.3 oz)    General appearance: Patient is awake and alert. Neck: Tracheostomy noted Resp: Good air entry bilaterally. No wheezing, rales or rhonchi. diminished at the bases.  Cardio: S1, S2, is bradycardic, regular. No S3, S4. No rubs, murmurs, or bruit. Minimal pedal edema.  GI: Abdomen remains soft. Not distended. Bowel sounds are present. No masses or organomegaly. Nontender.  Extremities: Minimal edema noted bilateral lower extremities Pulses: 2+ and symmetric Neurologic: Patient is awake and alert. Moving his upper extremities. No movement noted in the lower extremities. : Large open wound on left lateral portion of scrotum with purulent discharge. Foley in place.   Lab Results:  Data Reviewed: I have personally reviewed following labs and imaging studies  CBC:  Recent Labs Lab 12/19/16 0910 12/21/16 0233 12/22/16 0730 12/23/16 0447 12/24/16 0402  WBC 9.2 12.5* 11.2* 9.5 9.4  HGB 9.4* 10.1* 10.4* 10.7* 11.0*  HCT 30.9* 33.1* 34.2* 35.3* 34.9*  MCV 97.8 98.2 98.0 98.6 95.9  PLT 396 377 384 384 768    Basic Metabolic Panel:  Recent Labs Lab 12/19/16 0910 12/20/16 0926 12/21/16 0233 12/22/16 0730 12/23/16 0447 12/25/16 0605  NA 149* 151* 152* 150* 144 139  K 3.5 4.0 4.2 4.6 4.5 3.7  CL 113* 119* 118* 116* 115* 107  CO2 _1 GLUCOSE 217* 157* 226* 201* 277* 119*  BUN 51* 44* 41* 36* 37* 23*  CREATININE 1.16 0.91 0.90 0.78 0.87 0.57*  CALCIUM 9.3 9.4 9.2 9.3 9.1 8.7*  MG 2.5*  --   --   --   --   --     GFR: Estimated Creatinine Clearance: 128.7 mL/min (by C-G formula based on SCr of 0.57 mg/dL (L)).  Liver  Function Tests:  Recent Labs Lab 12/19/16 0910 12/23/16 0447  AST 40 54*  ALT 88* 90*  ALKPHOS 61 61  BILITOT 0.5 0.6  PROT 7.9 7.3  ALBUMIN 2.0* 2.1*     Coagulation Profile: No results for input(s): INR, PROTIME in the last 168 hours.  CBG:  Recent Labs Lab 12/24/16 1625 12/24/16 2021 12/25/16 0034 12/25/16 0524 12/25/16 0807  GLUCAP 132* 124* 122* 117* 104*     No results found for this or any previous visit (from the past 240 hour(s)).    Radiology Studies: Dg Abd 1 View  Result Date: 12/23/2016 CLINICAL DATA:  Feeding tube placement EXAM: ABDOMEN - 1 VIEW COMPARISON:  December 22, 2016 FINDINGS: Feeding tube tip is at the junction of the second and third portions of the duodenum. Bowel gas pattern is normal. No bowel obstruction. No free air. Moderate stool is present in the colon. IMPRESSION: Feeding tube tip at junction of second and third portions of duodenum. Bowel gas pattern normal. Electronically Signed   By: Lowella Grip III M.D.   On: 12/23/2016 13:45   Dg Chest Port 1 View  Result Date: 12/23/2016 CLINICAL DATA:  51 year old male with history of cough. EXAM: PORTABLE CHEST 1 VIEW COMPARISON:  Chest x-ray 12/19/2016. FINDINGS: Worsening patchy multifocal airspace disease in the left mid to lower lung and at the right lung base, concerning for progressive multilobar bronchopneumonia. Probable small left pleural effusion appears slightly increased compared to the prior study. No definite right pleural effusion. No evidence of pulmonary edema. Heart size appears borderline to mildly enlarged. Upper mediastinal contours are within normal limits. Feeding tube extends into the stomach, but the tip is below the lower margin of the images. Previously noted nasogastric tube has been removed. Tracheostomy tube in place with tip terminating 5.8 cm above the carina. IMPRESSION: 1. Worsening bilateral lower lobe multilobar bronchopneumonia (left greater than right) with  small left parapneumonic pleural effusion. 2. Support apparatus, as above. Electronically Signed   By: Vinnie Langton M.D.   On: 12/23/2016 13:20   Dg Abd Portable 1v  Result Date: 12/23/2016 CLINICAL DATA:  Enteric tube placement EXAM: PORTABLE ABDOMEN - 1 VIEW COMPARISON:  Abdominal radiograph from earlier today FINDINGS: Weighted enteric tube terminates in the right upper quadrant of the abdomen in the region of the pylorus. No dilated small bowel loops. No evidence of pneumatosis or pneumoperitoneum. Patchy left lung base opacity. IMPRESSION: Weighted enteric tube terminates in the right upper quadrant in the region of the pylorus. Nonobstructive bowel gas pattern. Patchy left lung base opacity. Electronically Signed   By: Ilona Sorrel M.D.   On: 12/23/2016 17:19     Medications:  Scheduled: . aspirin  325 mg Per Tube Daily  . chlorhexidine gluconate (MEDLINE KIT)  15 mL Mouth Rinse BID  . clonazePAM  0.5 mg Per Tube TID  . darunavir-cobicistat  1 tablet Oral Q breakfast  . dolutegravir  50 mg Oral Daily  . emtricitabine-tenofovir AF  1 tablet Per Tube Daily  . famotidine  20 mg Per Tube BID  . feeding supplement (PRO-STAT SUGAR FREE 64)  30 mL Per Tube BID  . fentaNYL  50 mcg Transdermal Q72H  . free water  200 mL Per Tube Q6H  . heparin  5,000 Units Subcutaneous Q8H  . insulin aspart  0-15 Units Subcutaneous Q4H  . insulin glargine  22 Units Subcutaneous BID  . lactulose  10 g Per Tube BID  . LORazepam  2 mg Intravenous Q6H  . mouth rinse  15 mL Mouth Rinse QID  . meropenem (MERREM) IV  2 g Intravenous Q8H  . methylPREDNISolone (SOLU-MEDROL) injection  20 mg Intravenous Q12H   Followed by  . [START ON 12/26/2016] methylPREDNISolone (SOLU-MEDROL) injection  20 mg Intravenous Q24H   Followed by  . [START ON 12/29/2016] methylPREDNISolone (SOLU-MEDROL) injection  20 mg Intravenous QODAY  . scopolamine  1 patch Transdermal Q72H  . sulfamethoxazole-trimethoprim  20 mL Per Tube Daily   . vancomycin  1,250 mg Intravenous Q8H   Continuous: . feeding supplement (JEVITY 1.2 CAL) Stopped (12/24/16 1832)   ZDG:UYQIHK chloride, acetaminophen, chlorproMAZINE (THORAZINE) IV, docusate, fentaNYL (SUBLIMAZE) injection, guaiFENesin-dextromethorphan, haloperidol lactate,  hydrALAZINE, sennosides      DVT Prophylaxis: Subcutaneous heparin    Code Status: Full code  Family Communication: No family at bedside  Disposition Plan: not stable for discharge     LOS: 24 days   [facility] Pager [phone] 12/25/2016, 9:23 AM  If 7PM-7AM, please contact night-coverage at [email or web address], password [facility]

## 2016-12-25 NOTE — Progress Notes (Signed)
Pharmacy Antibiotic Note  Lawrence Kane is a 51 y.o. male admitted on 12/01/2016 with a groin maceration.  Pharmacy has been consulted for fluconazole dosing. Patient is also being treated for HCAP with meropenem and vancomycin. Patient is afebrile with latest WBC count 9.4. Renal function is stable with CrCl~ 130 ml/min.   Plan: Fluconazole 200 mg daily Monitor renal and liver function.  Monitor signs and symptoms of infection.   Height: _0  (193 cm) Weight: 183 lb 10.3 oz (83.3 kg) IBW/kg (Calculated) : 86.8  Temp (24hrs), Avg:98.1 F (36.7 C), Min:97.9 F (36.6 C), Max:98.3 F (36.8 C)   Recent Labs Lab 12/19/16 0910 12/20/16 0926 12/21/16 0233 12/22/16 0730 12/23/16 0447 12/24/16 0402 12/25/16 0605  WBC 9.2  --  12.5* 11.2* 9.5 9.4  --   CREATININE 1.16 0.91 0.90 0.78 0.87  --  0.57*    Estimated Creatinine Clearance: 128.7 mL/min (by C-G formula based on SCr of 0.57 mg/dL (L)).    Allergies  Allergen Reactions  . Iodine   . Ketorolac     unknown  . Tylenol [Acetaminophen]     unknown    Antimicrobials this admission: Ceftriaxone 1/24 (started at OSH) >> 1/30 Vancomycin 1/24 (started at OSH) >> 1/30; 2/2 >>2/5; 2/15 >> Ampicillin 1/25 >>1/27 Acyclovir 1/25 >>1/26; 1/27>>(2/16) Bactrim 1/25 (PCP prophylaxis) >> 1/29  2/6 >> Meropenem 1/30 >> 2/1; 2/2 >>2/6; 2/15 >> Ciprofloxacin 2/1 >>2/2 PCN 2/1 >>2/2 Fluconazole 2/17 >>   Microbiology results: 1/22 scrotal wound MRSA 1/24 BCx x 2 Marland KitchenRandolph): 1/24 HSV PCR [name]): HSV2 POS 1/24 Cryptococcal [name]): 1/24 CSF [name]): gram stain no organism  1/24 Trach asp: ngtd 1/24 MRSA PCR: positive 1/25 BCx x2: ngtd 1/25 cryptococcal: negative 1/25 CSF culture (post abx): ngtd 1/25 CSF fungal Cx: neg 1/25 CSF AFB: neg 1/25 Toxoplasma: neg 1/27 stronglyoids ab: negative 1/28 fungus from bronch: none observed 1/28 AFB bronch: neg 1/28 PCP bronch: neg 1/28 BAL: 40,000 klebsiella pneumonia ESBL.  No parasites 1/29 CSF: ngtd 1/31 BCx: ng<12h 2/1 RPR: Neg CMV PCR - negative  Thank you for allowing pharmacy to be a part of this patient's care.  Ihor Austin, PharmD PGY1 Pharmacy Resident Pager: [phone] 12/25/2016 9:45 AM

## 2016-12-26 ENCOUNTER — Inpatient Hospital Stay (HOSPITAL_COMMUNITY): Payer: Medicaid Other

## 2016-12-26 ENCOUNTER — Encounter (HOSPITAL_COMMUNITY): Payer: Self-pay

## 2016-12-26 LAB — COMPREHENSIVE METABOLIC PANEL
ALT: 83 U/L — AB (ref 17–63)
AST: 55 U/L — AB (ref 15–41)
Albumin: 2 g/dL — ABNORMAL LOW (ref 3.5–5.0)
Alkaline Phosphatase: 62 U/L (ref 38–126)
Anion gap: 9 (ref 5–15)
BUN: 22 mg/dL — AB (ref 6–20)
CHLORIDE: 106 mmol/L (ref 101–111)
CO2: 22 mmol/L (ref 22–32)
Calcium: 8.5 mg/dL — ABNORMAL LOW (ref 8.9–10.3)
Creatinine, Ser: 0.6 mg/dL — ABNORMAL LOW (ref 0.61–1.24)
GFR calc Af Amer: >60 mL/min (ref >60)
Glucose, Bld: 80 mg/dL (ref 65–99)
POTASSIUM: 3.2 mmol/L — AB (ref 3.5–5.1)
SODIUM: 137 mmol/L (ref 135–145)
Total Bilirubin: 1.1 mg/dL (ref 0.3–1.2)
Total Protein: 6.9 g/dL (ref 6.5–8.1)

## 2016-12-26 LAB — GLUCOSE, CAPILLARY
GLUCOSE-CAPILLARY: 116 mg/dL — AB (ref 65–99)
GLUCOSE-CAPILLARY: 60 mg/dL — AB (ref 65–99)
GLUCOSE-CAPILLARY: 77 mg/dL (ref 65–99)
GLUCOSE-CAPILLARY: 85 mg/dL (ref 65–99)
GLUCOSE-CAPILLARY: 87 mg/dL (ref 65–99)
GLUCOSE-CAPILLARY: 94 mg/dL (ref 65–99)
Glucose-Capillary: 79 mg/dL (ref 65–99)
Glucose-Capillary: 34 mg/dL — CL (ref 65–99)
Glucose-Capillary: 118 mg/dL — ABNORMAL HIGH (ref 65–99)

## 2016-12-26 LAB — TROPONIN I
Troponin I: <0.03 ng/mL (ref <0.03)
Troponin I: <0.03 ng/mL (ref <0.03)

## 2016-12-26 MED ORDER — GLUCAGON HCL RDNA (DIAGNOSTIC) 1 MG IJ SOLR
INTRAMUSCULAR | Status: AC
Start: 2016-12-26 — End: 2016-12-26
  Administered 2016-12-26: 1 mg
  Filled 2016-12-26: qty 1

## 2016-12-26 MED ORDER — FLUCONAZOLE 100 MG PO TABS
200.0000 mg | ORAL_TABLET | Freq: Every day | ORAL | Status: DC
  Administered 2016-12-27: 200 mg
  Filled 2016-12-26: qty 2

## 2016-12-26 MED ORDER — DEXTROSE 50 % IV SOLN
INTRAVENOUS | Status: AC
  Administered 2016-12-26: 50 mL
  Filled 2016-12-26: qty 50

## 2016-12-26 MED ORDER — INSULIN GLARGINE 100 UNIT/ML ~~LOC~~ SOLN
10.0000 [IU] | Freq: Two times a day (BID) | SUBCUTANEOUS | Status: DC
  Administered 2016-12-26 – 2016-12-27 (×4): 10 [IU] via SUBCUTANEOUS
  Filled 2016-12-26 (×5): qty 0.1

## 2016-12-26 MED ORDER — PANTOPRAZOLE SODIUM 40 MG IV SOLR
40.0000 mg | INTRAVENOUS | Status: DC
  Administered 2016-12-26 – 2016-12-27 (×2): 40 mg via INTRAVENOUS
  Filled 2016-12-26 (×2): qty 40

## 2016-12-26 MED ORDER — KCL IN DEXTROSE-NACL 20-5-0.9 MEQ/L-%-% IV SOLN
INTRAVENOUS | Status: DC
  Administered 2016-12-26 – 2016-12-28 (×3): via INTRAVENOUS
  Filled 2016-12-26 (×5): qty 1000

## 2016-12-26 MED ORDER — FLUCONAZOLE IN SODIUM CHLORIDE 200-0.9 MG/100ML-% IV SOLN
200.0000 mg | INTRAVENOUS | Status: DC
  Filled 2016-12-26: qty 100

## 2016-12-26 NOTE — Progress Notes (Signed)
Hypoglycemic Event  CBG: 34  Treatment: Glucagon IM 1 mg  Symptoms: Hungry  Follow-up CBG: Time:1020 CBG Result:85  Possible Reasons for Event: Inadequate meal intake  Comments/MD notified:MD notified. Awaiting response    Donald SivaErica T Doye Montilla

## 2016-12-26 NOTE — Progress Notes (Signed)
Float nurse started and IV in R antecubital.  Hung D5NS as per order and Normal saline at 20. New tubing used. Hung vancomycin. Called pharmacy, spoke to [place] regarding change of IV administration.

## 2016-12-26 NOTE — Progress Notes (Signed)
[facility] PROGRESS NOTE  Lawrence Kane KYH:[id number] DOB: [birth year] DOA: 12/01/2016  PCP: No primary care provider on file.  Brief History/Interval Summary: 51 year old man with HIV/AIDS (first diagnosed 2003, last CD4 count 245 with viremia of 1,160 in October 2017; on Genovya, Darunavir and Bactrim), Genital Warts, COPD, and cocaine substance abuse who presented to [facility] on 1/24 with complaint of altered mental status. Per notes, patient may have recently had some type of surgery in [place] where his mother resides. Afebrile, tachycardic to 155, RR 18, hypertensive to 240/112. Patient was intubated for airway protection, CVL in IJ was placed, and LP was performed given his altered mental status. ID was consulted. Patient was placed on antibiotics. He subsequently had to undergo tracheostomy. Subsequently, patient developed an ileus. NG tube was placed to intermittent suction.  Patient has pulled out his NG tube multiple times this admission. Still unable to swallow. Speech therapy consultation is pending     Assessment/Plan:  Acute Hypoxemic Respiratory Failure , Tracheostomy 2/5 This was secondary to HCAP ESBL Klebsiella LLL+ sepsis + Unable to protect airway + pulm edema. Patient was on the ventilator for a prolonged duration. Subsequently underwent tracheostomy placement on 2/5. Pulmonology is following. Patient is off of the Precedex infusion.   Was on trach collar all day yesterday and tolerated it well. Starting to have mucopurulent secretions, patient received multiple courses of antibiotic between 1/24-2/7. Chest x-ray 2/15 showed worsening bronchopneumonia. Started patient on Robitussin. Concern for recurrent pneumonia/tracheobronchitis remains . previous 1/28 BAL gram stain/Cx: 40K ESBL Klebsiella pneumoniae, therefore patient restarted on meropenem and vancomycin 2/15, will continue through 2/22. Discussed with PCCM , , patient will continue with cuff  tracheostomy tube in case he needs to go back on the ventilator Proceed with establishing PO intake without replacing NG tube    Hypoglycemia Reduced dose of lantus to 10 units BID   Dysarthria,  R/o for acute CVA, MRI of the brain last night was negative  Pending modified barium swallow   Scrotal wound/history of genital warts Left scrotal area wound is deeper with odor and the head of the penis has wart like growths that area much worse than at the time of admission Started on fluconazole IV Periwound: various wart like lesion, patient with history of genital warts known at the time of admission May need urology consult Awaiting records from outside facility   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 HSV positive on CSF from [facility]. Infectious disease has been following. Patient is on acyclovir. He will need this for total of 21 days. Will complete treatment on 2/17.  Continue Bactrim for PCP prophylaxis.  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 Patient is on acyclovir. High-dose steroids 1000 mg/day , given for 5 days. Now on low-dose steroid. This will need to be slowly tapered down. Neurology has seen the patient. On fentanyl patch for pain issues. Started during this hospitalization.Re-image spine in 2-3 weeks to assess for improvement. Neurology signed off 2/12.  Ileus Improved. Patient had NG tube placed 2/8. Patient has removed his NG several times.  Continue laxatives.   DM, Hyperglycemia Unknown if patient has a history of diabetes. HbA1c 6.6 on 1/26. CBGs were extremely elevated due to steroids. He was initially placed on Lantus. Decrease Lantus today because of borderline CBG. Patient is currently nothing by mouth.  .    Bradycardia Heart rate  has improved. Bradycardia was most likely due to medication such as versed. TSH is normal.  Continue to monitor.   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 On aspirin. Echocardiogram shows normal systolic function. LDL 93. PT and OT evaluation. No significant carotid stenosis noted on MRA neck.  he was seen by neurology. They had mentioned that a TEE could be considered once the patient was more stable. This can be pursued at a later date, although embolic stroke appeared to be less of a possibility based on his clinical scenario. Repeat MRI negative for acute CVA  Pulmonary edema Seems to have improved. Continues to be on IV Lasix, which will be continued for now.   Moderate to Severe Protein Calorie Malnutrition  Feedings on hold due to ileus/patient keeps pulling out NGT     Hypernatremia Free water to continue. Increase to every 6 hours. Repeat labs tomorrow. Tube feedings on hold. Sodium level should come down.  Transaminitis Worsened by seroquel likely which was discontinued on 1/31. Improved.   HCV ab+, Chronic hep b Management will be pursued as outpatient.  Normocytic anemia Hemoglobin stable. No evidence for overt bleeding. Continue to monitor.  LLL PNA with ESBL Klebsiella  Restarted antibiotics 2/15  HIV On antiretroviral treatment. Infectious disease was following.  Metabolic Encephalopathy Continue to monitor. Seems to be stable. PRN Sedative agents for agitation. Restraints in place to prevent him from pulling out supportive tubes.  History of Cocaine Abuse Will need counseling when able to communicate effectively    Consultants: Infectious disease. Critical care medicine. Neurology.  STUDIES:  1/24 CXR >> ATX vs LLL infiltrate 1/25 CT head >> neg for acute intracranial process 1/25 LP cytology >> no malignant cells 1/25 LP >> yellow, cloudy, glucose 20, total protein >600, RBC 114>49, WBC 970, 36% neutrophil, 20% lymphs, 19% eos 1/28 BAL cell count >> 266 WBC, eos 1 1/28 BAL  cytology >> no malignant cells 1/27 BAL for wet mount> neg for parasites 1/27 MRI brain >> small acute infarcts in cerebellum and cerebral cortex, sucal signal 1/29 LP >> Glucose 82, protein >600, WBC 600>885, eosinophils 28% 1/29 Echo >> LV EF 60-65%, indeterminent diastolic function, wall motion normal 1/30 EEG >> diffuse slowing and background suppression, no seizures 2/3 MRI thoracic/lumbar/cervical >> (-) abscess. Meningitis. R/O CMV   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 1/24 Cryptococcal [name]): negative 1/24 CSF [facility]): NGTD 1/22 scrotal wound cx [name]): MRSA Toxoplasma [name]): Negative 1/25BCx x2 : NGTD 1/25 HCV ab + 1/24 Trach asp 1/24: NGTD 1/24MRSA PCR: MRSA pos 1/25 HIV quant: 80, CD4 10 1/25 CSF AFB: Smear neg, >> 1/25 CSF fungal cx: NGTD 1/25 CSF crypto ag: neg 1/25 CSF gram stain no organisms, cx: NGTD 1/27 strongyloides ab >> negative 1/27 coccidiomycosis ab >> 1/28 BAL AFB smear/cx> 1/28 BAL Fungus cx >> 1/28 BAL gram stain/Cx: 40K ESBL Klebsiella pneumoniae 1/28 BAL pneumocystis smear: Neg 1/27 O&P >> 1/29 CSF Cx >> no organisms on gram stain, >> NGTD 2/04 CSF CMV >> not done?   ANTIBIOTICS: Rocephin 1/24>>1/30 Vancomycin 1/24>>1/30; 2/2 >  Acyclovir 1/24>> 1/26, 1/27>> Ampicillin 1/24>> 1/27 Bactrim M/W/F for PCP prophylaxis Meropenem 1/30>> 2/1; 2/2 >  Pen G 2/1 >> 2/2 Cipro 2/1 >> 2/2 Meropenem/vancomycin-2/15-  SIGNIFICANT EVENTS: 1/24  Transferred from [facility] 1/25  Underwent LP here 2/05  Trach 2/06  To ATC 2/8  patient developed ileus  LINES/TUBES:  L IJ CVL 1/24 >> ETT 1/24 >> 2/5 Foley 1/24 >> OGT 1/24 >> 2/5 Trach (df) 2/5 >>   Subjective/Interval History: Chest pain last night  Hypoglycemic this am   ROS: Unable to do.  Objective:  Vital Signs  Vitals:   12/25/16 2202 12/25/16 2228 12/26/16 0200 12/26/16 0520  BP:  (!) 156/92  (!) 142/83  Pulse: 73  68 70 80  Resp: 20 (!) 22 18 (!) 24  Temp:  97.8 F (36.6 C)  98.3 F (36.8 C)  TempSrc:    Oral  SpO2: 97%  95% 91%  Weight:    88.3 kg (194 lb 10.7 oz)  Height:        Intake/Output Summary (Last 24 hours) at 12/26/16 0913 Last data filed at 12/25/16 2313  Gross per 24 hour  Intake                0 ml  Output             1150 ml  Net            -1150 ml   Filed Weights   12/24/16 0420 12/25/16 0500 12/26/16 0520  Weight: 88.7 kg (195 lb 8.8 oz) 83.3 kg (183 lb 10.3 oz) 88.3 kg (194 lb 10.7 oz)    General appearance: Patient is awake and alert. Neck: Tracheostomy noted Resp: Good air entry bilaterally. No wheezing, rales or rhonchi. diminished at the bases.  Cardio: S1, S2, is bradycardic, regular. No S3, S4. No rubs, murmurs, or bruit. Minimal pedal edema.  GI: Abdomen remains soft. Not distended. Bowel sounds are present. No masses or organomegaly. Nontender.  Extremities: Minimal edema noted bilateral lower extremities Pulses: 2+ and symmetric Neurologic: Patient is awake and alert. Moving his upper extremities. No movement noted in the lower extremities. : Large open wound on left lateral portion of scrotum with purulent discharge. Foley in place.   Lab Results:  Data Reviewed: I have personally reviewed following labs and imaging studies  CBC:  Recent Labs Lab 12/21/16 0233 12/22/16 0730 12/23/16 0447 12/24/16 0402  WBC 12.5* 11.2* 9.5 9.4  HGB 10.1* 10.4* 10.7* 11.0*  HCT 33.1* 34.2* 35.3* 34.9*  MCV 98.2 98.0 98.6 95.9  PLT 377 384 384 829    Basic Metabolic Panel:  Recent Labs Lab 12/21/16 0233 12/22/16 0730 12/23/16 0447 12/25/16 0605 12/26/16 0622  NA 152* 150* 144 139 137  K 4.2 4.6 4.5 3.7 3.2*  CL 118* 116* 115* 107 106  CO2 _0 GLUCOSE 226* 201* 277* 119* 80  BUN 41* 36* 37* 23* 22*  CREATININE 0.90 0.78 0.87 0.57* 0.60*  CALCIUM 9.2 9.3 9.1 8.7* 8.5*    GFR: Estimated Creatinine Clearance: 134.1 mL/min (by C-G formula  based on SCr of 0.6 mg/dL (L)).  Liver Function Tests:  Recent Labs Lab 12/23/16 0447 12/26/16 0622  AST 54* 55*  ALT 90* 83*  ALKPHOS 61 62  BILITOT 0.6 1.1  PROT 7.3 6.9  ALBUMIN 2.1* 2.0*     Coagulation Profile: No results for input(s): INR, PROTIME in the last 168 hours.  CBG:  Recent Labs Lab 12/25/16 1743 12/25/16 1956 12/26/16 0005 12/26/16 0121 12/26/16 0400  GLUCAP 89 77 77 116* 79     No results found for this or any previous visit (from the past 240 hour(s)).    Radiology Studies: Dg Chest 1 View  Result Date: 12/26/2016 CLINICAL DATA:  Altered mental status.  HIV  disease EXAM: CHEST 1 VIEW COMPARISON:  December 23, 2016 FINDINGS: Tracheostomy catheter tip is 7.0 cm above the carina. No pneumothorax. There is airspace consolidation throughout much the left lower lobe which was also present previously but appears somewhat more coalesced that this time. There is a small left pleural effusion. Right lung is clear. Heart size and pulmonary vascularity are normal. No adenopathy. No bone lesions. IMPRESSION: Airspace consolidation consistent with pneumonia left lower lobe with small left pleural effusion. Lungs elsewhere clear. Tracheostomy as described. No pneumothorax. Cardiac silhouette within normal limits. Electronically Signed   By: Lowella Grip III M.D.   On: 12/26/2016 09:00   Mr Brain Wo Contrast  Result Date: 12/26/2016 CLINICAL DATA:  Altered mental status EXAM: MRI HEAD WITHOUT CONTRAST TECHNIQUE: Multiplanar, multiecho pulse sequences of the brain and surrounding structures were obtained without intravenous contrast. COMPARISON:  12/09/2016 FINDINGS: Brain: Resolved areas of restricted diffusion. On sagittal T1 weighted imaging there are small areas of T1 hyperintensity along the areas of previously described infarct along the sylvian fissures, likely cortical laminar necrosis. No blood products are noted. Mild FLAIR hyperintensity in the regions of  fourth ventricular and left inferior cerebellar disease previously. No signs of acute or interval infarct. No swelling or evidence of sulcal/intraventricular debris. No hydrocephalus or mass. Vascular: Preserved flow voids Skull and upper cervical spine: Negative Sinuses/Orbits: Fluid levels in bilateral maxillary and sphenoid sinuses and in the left more than right mastoids. IMPRESSION: 1. No acute or interval finding. Acute findings on previous exam are resolved. 2. Expected evolution of small scattered infarcts seen previously. 3. Bilateral paranasal sinus and mastoid fluid levels. Electronically Signed   By: Monte Fantasia M.D.   On: 12/26/2016 08:32     Medications:  Scheduled: . aspirin  325 mg Per Tube Daily  . chlorhexidine gluconate (MEDLINE KIT)  15 mL Mouth Rinse BID  . clonazePAM  0.5 mg Per Tube TID  . darunavir-cobicistat  1 tablet Oral Q breakfast  . dolutegravir  50 mg Oral Daily  . emtricitabine-tenofovir AF  1 tablet Per Tube Daily  . famotidine  20 mg Per Tube BID  . feeding supplement (PRO-STAT SUGAR FREE 64)  30 mL Per Tube BID  . fentaNYL  50 mcg Transdermal Q72H  . fluconazole (DIFLUCAN) IV  200 mg Intravenous Q24H  . free water  200 mL Per Tube Q6H  . heparin  5,000 Units Subcutaneous Q8H  . insulin aspart  0-15 Units Subcutaneous Q4H  . insulin glargine  10 Units Subcutaneous BID  . lactulose  10 g Per Tube BID  . LORazepam  2 mg Intravenous Q6H  . mouth rinse  15 mL Mouth Rinse QID  . meropenem (MERREM) IV  2 g Intravenous Q8H  . methylPREDNISolone (SOLU-MEDROL) injection  20 mg Intravenous Q12H   Followed by  . methylPREDNISolone (SOLU-MEDROL) injection  20 mg Intravenous Q24H   Followed by  . [START ON 12/29/2016] methylPREDNISolone (SOLU-MEDROL) injection  20 mg Intravenous QODAY  . scopolamine  1 patch Transdermal Q72H  . sulfamethoxazole-trimethoprim  20 mL Per Tube Daily  . vancomycin  1,250 mg Intravenous Q8H   Continuous: . dextrose 5 % and 0.9 %  NaCl with KCl 20 mEq/L    . feeding supplement (JEVITY 1.2 CAL) Stopped (12/24/16 1832)   ZGY:[id number], chlorproMAZINE (THORAZINE) IV, docusate, fentaNYL (SUBLIMAZE) injection, guaiFENesin-dextromethorphan, haloperidol lactate, hydrALAZINE, sennosides      DVT Prophylaxis: Subcutaneous heparin    Code Status: Full code  Family Communication:  No family at bedside  Disposition Plan: not stable for discharge     LOS: 25 days   [facility] Pager [phone] 12/26/2016, 9:13 AM  If 7PM-7AM, please contact night-coverage at [email or web address], password [facility]

## 2016-12-26 NOTE — Progress Notes (Signed)
SLP Cancellation Note  Patient Details Name: Lawrence Kane MRN: [id number] DOB: 1966-01-14   Cancelled treatment:       Reason Eval/Treat Not Completed: Other (comment) Scheduling conflict with radiology and transportation. Pt to be seen as soon as possible 2/19.   Marcene Duoshelsea Sumney MA, CCC-SLP Acute Care Speech Language Pathologist    Lawrence Kane, Lawrence Kane 12/26/2016, 3:39 PM

## 2016-12-26 NOTE — Progress Notes (Signed)
Spoke with Dr. Pixie CasinoJ. Kim to clarify if patient can go off the floor without nurse and telemetry, and he acknowledged this. Awaiting order.

## 2016-12-26 NOTE — Progress Notes (Signed)
Pt currently not wearing ATC. Pt tolerating okay at this time. Will replace if secretions seem to dry out. RT Will continue to monitor

## 2016-12-26 NOTE — Progress Notes (Signed)
Dr Selena BattenKim called. I explained the situation regarding pt being a very difficult stick and mentioned the possibility of a PICC. He gave orders that if IV can't get started an order for PICC is appropriate.

## 2016-12-26 NOTE — Progress Notes (Signed)
Dr Selena BattenKim called and said he would come look at the patient.  Wes, RR RN came to floor. Awaiting MD.

## 2016-12-26 NOTE — Progress Notes (Addendum)
Pt refused lab draw, including vanc trough. MD notified. Pharm contacted and will cancel order for trough, but stated to give Vanc as ordered.

## 2016-12-26 NOTE — Progress Notes (Signed)
Paged Dr Selena BattenKim, informed him of  IV infiltrate and mentioned the possibility of needing a PICC line. Awaiting call back.

## 2016-12-26 NOTE — Significant Event (Signed)
Rapid Response Event Note  Overview: Time Called: 0510 Arrival Time: 0515 Event Type: Other (Comment)  Initial Focused Assessment:   Interventions:  Plan of Care (if not transferred):  Event Summary: Called to evaluate patient for report of chest pain. Unit RN states that patient also reported having a stroke. Patient is alert, responsive and nonverbal. Trach intact with patent airway.  No signs of acute distress. No focal weakness noted. Advised unit RN to obtain an EKG and notify on-call provider. No RRT interventions needed at present.     Beryl MeagerHarbison, [place] BainbridgeLamond

## 2016-12-26 NOTE — Progress Notes (Signed)
Reassessing IV site, IV site had infiltrated. IV stopped. Gauze dressing applied, with a heat pack placed and elevated patients arm on a pillow.

## 2016-12-26 NOTE — Progress Notes (Signed)
Dr Selena BattenKim returned call. Gave order to hold lantus tonight.

## 2016-12-26 NOTE — Progress Notes (Signed)
Patient ID: Lawrence Kane, male   DOB: 1966/03/06, 51 y.o.   MRN: [id number]                                                         [place] NOTE                                                                                                               Patient Demographics:    Nijel Flink, is a 51 y.o. male, DOB - 02/15/1966, TOI:[id number]  Admit date - 12/01/2016   Admitting Physician Raylene Miyamoto, MD  Outpatient Primary MD for the patient is No primary care provider on file.  LOS - 25  Outpatient Specialists:    No chief complaint on file.      XCOVER NOTE  Brief Narrative  51 year old man with HIV/AIDS (first diagnosed 2003, last CD4 count 245 with viremia of 1,160 in October 2017; on Genovya, Darunavir and Bactrim), Genital Warts, COPD, and cocaine substance abuse who presented to [facility] on 1/24 with complaint of altered mental status. Per notes, patient may have recently had some type of surgery in [place] where his mother resides. Afebrile, tachycardic to 155, RR 18, hypertensive to 240/112. Patient was intubated for airway protection, CVL in IJ was placed, and LP was performed given his altered mental status. ID was consulted. Patient was placed on antibiotics. He subsequently had to undergo tracheostomy. Subsequently, patient developed an ileus. NG tube was placed to intermittent suction.  Patient has pulled out his NG tube multiple times this admission. Still unable to swallow. Speech therapy consultation is pending   Subjective:    Emarion Toral today has apparently some occipital headache, and complains per RN note, that he is having a stroke.  Pt has difficulty with communication due to tracheostomy and maybe a poor historian.  Pt also notes chest pain, achy in the substernal chest area.  No radiation.  It is difficult to tell how long that his symptoms have been occurring.   Denies fever, chills, cough, palp, sob, n/v, diarrhea.     Assessment  & Plan :     Active Problems:   COPD (chronic obstructive pulmonary disease) (HCC)   Substance abuse   Genital warts   HIV (human immunodeficiency virus infection) ([place])   AIDS (acquired immune deficiency syndrome) ([place])   Noncompliance   Bacterial meningitis   Hypertensive urgency   Septic shock (HCC)   Transaminitis   Cocaine abuse   Open wound   Acute respiratory failure (HCC)   Altered mental status   Acute encephalopathy   Cerebral embolism with cerebral infarction   Endotracheally intubated   Meningoencephalitis   Neurosyphilis   Herpesviral meningitis   Paraplegia ([place])   Acute pulmonary edema ([place])   Transverse myelitis ([place])   Encounter for orogastric (OG) tube placement  Somnolence   Abdominal distention   Ileus (HCC)   Muscle weakness (generalized)   Acute respiratory failure with hypoxia (HCC)   Tracheostomy status (HCC)   Hypoxemia   Respiratory distress   Agitation   Hypernatremia   Leukocytosis   Acute blood loss anemia   Pain   Palliative care encounter   1. Dysarthria,  R/o CVA Check MRI brain  2. CP Ekg=> NSR at 80, LAD, early R progression, no st-t changes c/w ischemia Tele,  Trop I q6h x3   Code Status : FULL CODE  Family Communication  : no family available  DVT Prophylaxis  :   SCDs   Lab Results  Component Value Date   PLT 362 12/24/2016    Antibiotics  :    Anti-infectives    Start     Dose/Rate Route Frequency Ordered Stop   12/25/16 1030  fluconazole (DIFLUCAN) 40 MG/ML suspension 200 mg     200 mg Per Tube Daily 12/25/16 0956     12/23/16 1530  meropenem (MERREM) 2 g in sodium chloride 0.9 % 100 mL IVPB     2 g 200 mL/hr over 30 Minutes Intravenous Every 8 hours 12/23/16 1508     12/23/16 1530  vancomycin (VANCOCIN) 1,250 mg in sodium chloride 0.9 % 250 mL IVPB     1,250 mg 166.7 mL/hr over 90 Minutes Intravenous Every 8 hours 12/23/16 1508     12/14/16 1515  sulfamethoxazole-trimethoprim (BACTRIM,SEPTRA) 200-40 MG/5ML  suspension 20 mL     20 mL Per Tube Daily 12/14/16 1508     12/13/16 1800  sulfamethoxazole-trimethoprim (BACTRIM DS,SEPTRA DS) 800-160 MG per tablet 1 tablet  Status:  Discontinued     1 tablet Oral Daily 12/13/16 1753 12/14/16 1508   12/12/16 1800  acyclovir (ZOVIRAX) 1,000 mg in sodium chloride 0.9 % 150 mL injection     150 mL/hr over 60 Minutes Intravenous Every 8 hours 12/12/16 1410 12/24/16 2359   12/10/16 1400  meropenem (MERREM) 2 g in sodium chloride 0.9 % 100 mL IVPB  Status:  Discontinued     2 g 200 mL/hr over 30 Minutes Intravenous Every 8 hours 12/10/16 1140 12/14/16 1506   12/10/16 1300  vancomycin (VANCOCIN) 1,250 mg in sodium chloride 0.9 % 250 mL IVPB  Status:  Discontinued     1,250 mg 166.7 mL/hr over 90 Minutes Intravenous Every 8 hours 12/10/16 1140 12/13/16 0824   12/09/16 1000  penicillin G potassium 4 Million Units in dextrose 5 % 250 mL IVPB  Status:  Discontinued     4 Million Units 250 mL/hr over 60 Minutes Intravenous Every 4 hours 12/09/16 0851 12/10/16 1109   12/09/16 1000  ciprofloxacin (CIPRO) IVPB 400 mg  Status:  Discontinued     400 mg 200 mL/hr over 60 Minutes Intravenous Every 12 hours 12/09/16 0929 12/10/16 1109   12/07/16 2245  meropenem (MERREM) 2 g in sodium chloride 0.9 % 100 mL IVPB  Status:  Discontinued     2 g 200 mL/hr over 30 Minutes Intravenous Every 8 hours 12/07/16 2236 12/09/16 0851   12/04/16 2200  vancomycin (VANCOCIN) 1,250 mg in sodium chloride 0.9 % 250 mL IVPB  Status:  Discontinued     1,250 mg 166.7 mL/hr over 90 Minutes Intravenous Every 8 hours 12/04/16 1421 12/07/16 1057   12/04/16 1000  acyclovir (ZOVIRAX) 1,000 mg in dextrose 5 % 150 mL IVPB  Status:  Discontinued     1,000 mg 170 mL/hr over  60 Minutes Intravenous Every 8 hours 12/04/16 0943 12/12/16 1409   12/03/16 0900  acyclovir (ZOVIRAX) 1,080 mg in dextrose 5 % 250 mL IVPB  Status:  Discontinued     1,080 mg 271.6 mL/hr over 60 Minutes Intravenous Every 8 hours  12/03/16 0651 12/03/16 0843   12/03/16 0900  acyclovir (ZOVIRAX) 1,000 mg in dextrose 5 % 150 mL IVPB  Status:  Discontinued     1,000 mg 170 mL/hr over 60 Minutes Intravenous Every 8 hours 12/03/16 0843 12/03/16 1002   12/02/16 1015  darunavir-cobicistat (PREZCOBIX) 800-150 MG per tablet 1 tablet     1 tablet Oral Daily with breakfast 12/02/16 1003     12/02/16 1000  emtricitabine-tenofovir AF (DESCOVY) 200-25 MG per tablet 1 tablet     1 tablet Per Tube Daily 12/01/16 2320     12/02/16 1000  dolutegravir (TIVICAY) tablet 50 mg     50 mg Oral Daily 12/01/16 2320     12/02/16 1000  vancomycin (VANCOCIN) 1,250 mg in sodium chloride 0.9 % 250 mL IVPB  Status:  Discontinued     1,250 mg 166.7 mL/hr over 90 Minutes Intravenous Every 24 hours 12/02/16 0010 12/02/16 0634   12/02/16 0900  acyclovir (ZOVIRAX) 930 mg in dextrose 5 % 150 mL IVPB  Status:  Discontinued     10 mg/kg  93 kg 168.6 mL/hr over 60 Minutes Intravenous Every 8 hours 12/02/16 0632 12/03/16 0651   12/02/16 0700  vancomycin (VANCOCIN) IVPB 1000 mg/200 mL premix  Status:  Discontinued     1,000 mg 200 mL/hr over 60 Minutes Intravenous Every 8 hours 12/02/16 0634 12/04/16 1421   12/02/16 0400  cefTRIAXone (ROCEPHIN) 2 g in dextrose 5 % 50 mL IVPB  Status:  Discontinued     2 g 100 mL/hr over 30 Minutes Intravenous Every 12 hours 12/02/16 0010 12/07/16 2222   12/02/16 0100  acyclovir (ZOVIRAX) 1,110 mg in dextrose 5 % 250 mL IVPB  Status:  Discontinued     10 mg/kg  111.1 kg 272.2 mL/hr over 60 Minutes Intravenous Every 8 hours 12/02/16 0010 12/02/16 0632   12/02/16 0100  sulfamethoxazole-trimethoprim (BACTRIM,SEPTRA) 200-40 MG/5ML suspension 20 mL  Status:  Discontinued     20 mL Per Tube Once per day on Mon Wed Fri 12/02/16 0010 12/06/16 1416   12/02/16 0000  ampicillin (OMNIPEN) 2 g in sodium chloride 0.9 % 50 mL IVPB  Status:  Discontinued     2 g 150 mL/hr over 20 Minutes Intravenous Every 4 hours 12/01/16 2322 12/04/16  0941        Objective:   Vitals:   12/25/16 2202 12/25/16 2228 12/26/16 0200 12/26/16 0520  BP:  (!) 156/92  (!) 142/83  Pulse: 73 68 70 80  Resp: 20 (!) 22 18 (!) 24  Temp:  97.8 F (36.6 C)  98.3 F (36.8 C)  TempSrc:    Oral  SpO2: 97%  95% 91%  Weight:    88.3 kg (194 lb 10.7 oz)  Height:        Wt Readings from Last 3 Encounters:  12/26/16 88.3 kg (194 lb 10.7 oz)  11/28/16 111.1 kg (245 lb)  11/06/16 108.9 kg (240 lb)     Intake/Output Summary (Last 24 hours) at 12/26/16 0555 Last data filed at 12/25/16 2313  Gross per 24 hour  Intake                0 ml  Output  1150 ml  Net            -1150 ml     Physical Exam  Awake Alert, , No new F.N deficits, Normal affect [place].AT,PERRAL, pupils 1.33m symmetric,  Supple Neck,No JVD, No cervical lymphadenopathy appriciated. + tracheostomy Symmetrical Chest wall movement, Good air movement bilaterally, CTAB RRR,No Gallops,Rubs or new Murmurs, No Parasternal Heave +ve B.Sounds, Abd Soft, No tenderness, No organomegaly appriciated, No rebound - guarding or rigidity. No Cyanosis, Clubbing or edema, No new Rash or bruise   Good grip bilaterally    Data Review:    CBC  Recent Labs Lab 12/19/16 0910 12/21/16 0233 12/22/16 0730 12/23/16 0447 12/24/16 0402  WBC 9.2 12.5* 11.2* 9.5 9.4  HGB 9.4* 10.1* 10.4* 10.7* 11.0*  HCT 30.9* 33.1* 34.2* 35.3* 34.9*  PLT 396 377 384 384 362  MCV 97.8 98.2 98.0 98.6 95.9  MCH 29.7 30.0 29.8 29.9 30.2  MCHC 30.4 30.5 30.4 30.3 31.5  RDW 17.3* 17.1* 17.4* 17.6* 17.2*    Chemistries   Recent Labs Lab 12/19/16 0910 12/20/16 0926 12/21/16 0233 12/22/16 0730 12/23/16 0447 12/25/16 0605  NA 149* 151* 152* 150* 144 139  K 3.5 4.0 4.2 4.6 4.5 3.7  CL 113* 119* 118* 116* 115* 107  CO2 _0 GLUCOSE 217* 157* 226* 201* 277* 119*  BUN 51* 44* 41* 36* 37* 23*  CREATININE 1.16 0.91 0.90 0.78 0.87 0.57*  CALCIUM 9.3 9.4 9.2 9.3 9.1 8.7*  MG 2.5*  --    --   --   --   --   AST 40  --   --   --  54*  --   ALT 88*  --   --   --  90*  --   ALKPHOS 61  --   --   --  61  --   BILITOT 0.5  --   --   --  0.6  --    ------------------------------------------------------------------------------------------------------------------ No results for input(s): CHOL, HDL, LDLCALC, TRIG, CHOLHDL, LDLDIRECT in the last 72 hours.  Lab Results  Component Value Date   HGBA1C 6.6 (H) 12/03/2016   ------------------------------------------------------------------------------------------------------------------ No results for input(s): TSH, T4TOTAL, T3FREE, THYROIDAB in the last 72 hours.  Invalid input(s): FREET3 ------------------------------------------------------------------------------------------------------------------ No results for input(s): VITAMINB12, FOLATE, FERRITIN, TIBC, IRON, RETICCTPCT in the last 72 hours.  Coagulation profile No results for input(s): INR, PROTIME in the last 168 hours.  No results for input(s): DDIMER in the last 72 hours.  Cardiac Enzymes No results for input(s): CKMB, TROPONINI, MYOGLOBIN in the last 168 hours.  Invalid input(s): CK ------------------------------------------------------------------------------------------------------------------ No results found for: BNP  Inpatient Medications  Scheduled Meds: . aspirin  325 mg Per Tube Daily  . chlorhexidine gluconate (MEDLINE KIT)  15 mL Mouth Rinse BID  . clonazePAM  0.5 mg Per Tube TID  . darunavir-cobicistat  1 tablet Oral Q breakfast  . dolutegravir  50 mg Oral Daily  . emtricitabine-tenofovir AF  1 tablet Per Tube Daily  . famotidine  20 mg Per Tube BID  . feeding supplement (PRO-STAT SUGAR FREE 64)  30 mL Per Tube BID  . fentaNYL  50 mcg Transdermal Q72H  . fluconazole  200 mg Per Tube Daily  . free water  200 mL Per Tube Q6H  . heparin  5,000 Units Subcutaneous Q8H  . insulin aspart  0-15 Units Subcutaneous Q4H  . insulin glargine  22 Units  Subcutaneous BID  . lactulose  10 g Per Tube BID  . LORazepam  2 mg Intravenous Q6H  . mouth rinse  15 mL Mouth Rinse QID  . meropenem (MERREM) IV  2 g Intravenous Q8H  . methylPREDNISolone (SOLU-MEDROL) injection  20 mg Intravenous Q12H   Followed by  . methylPREDNISolone (SOLU-MEDROL) injection  20 mg Intravenous Q24H   Followed by  . [START ON 12/29/2016] methylPREDNISolone (SOLU-MEDROL) injection  20 mg Intravenous QODAY  . scopolamine  1 patch Transdermal Q72H  . sulfamethoxazole-trimethoprim  20 mL Per Tube Daily  . vancomycin  1,250 mg Intravenous Q8H   Continuous Infusions: . dextrose 5 % and 0.9% NaCl 1,000 mL (12/25/16 2225)  . feeding supplement (JEVITY 1.2 CAL) Stopped (12/24/16 1832)   PRN Meds:.sodium chloride, acetaminophen, chlorproMAZINE (THORAZINE) IV, docusate, fentaNYL (SUBLIMAZE) injection, guaiFENesin-dextromethorphan, haloperidol lactate, hydrALAZINE, sennosides  Micro Results No results found for this or any previous visit (from the past 240 hour(s)).  Radiology Reports Ct Abdomen Pelvis Wo Contrast  Result Date: 12/17/2016 CLINICAL DATA:  Bowel obstruction EXAM: CT ABDOMEN AND PELVIS WITHOUT CONTRAST TECHNIQUE: Multidetector CT imaging of the abdomen and pelvis was performed following the standard protocol without IV contrast. COMPARISON:  12/16/2016, CT 11/29/2016 FINDINGS: Lower chest: Atelectasis or partial consolidation in the right lower lobe. Dense partial left lower lobe consolidation concerning for a pneumonia. Normal heart size. Hepatobiliary: No focal hepatic abnormality. The gallbladder is enlarged up to 5.1 cm. No calcified stones. No wall thickening. No biliary dilatation. Pancreas: Unremarkable. No pancreatic ductal dilatation or surrounding inflammatory changes. Spleen: Normal in size without focal abnormality. Adrenals/Urinary Tract: Adrenal glands are unremarkable. Kidneys are normal, without renal calculi, focal lesion, or hydronephrosis. Bladder  contains a Foley catheter Stomach/Bowel: Esophageal tube is present in the stomach and the tip terminates in the proximal duodenum. There are borderline to slightly dilated fluid-filled loops of small bowel throughout the abdomen. There is enlarged fluid filled right colon. No wall thickening. The appendix is not well identified. Vascular/Lymphatic: Non aneurysmal aorta. No grossly enlarged lymph nodes. Reproductive: No masses. Other: No free air.  No free fluid. Musculoskeletal: No acute or suspicious bone lesions IMPRESSION: 1. Borderline to mildly enlarged loops of small bowel throughout the abdomen with dilated fluid-filled right colon and transverse colon, findings are suggestive of an ileus. Partial bowel obstruction included in the differential but felt less likely. 2. Partial consolidation in the left lower lobe concerning for pneumonia. Electronically Signed   By: Donavan Foil M.D.   On: 12/17/2016 01:36   Dg Abd 1 View  Result Date: 12/23/2016 CLINICAL DATA:  Feeding tube placement EXAM: ABDOMEN - 1 VIEW COMPARISON:  December 22, 2016 FINDINGS: Feeding tube tip is at the junction of the second and third portions of the duodenum. Bowel gas pattern is normal. No bowel obstruction. No free air. Moderate stool is present in the colon. IMPRESSION: Feeding tube tip at junction of second and third portions of duodenum. Bowel gas pattern normal. Electronically Signed   By: Lowella Grip III M.D.   On: 12/23/2016 13:45   Ct Head Wo Contrast  Result Date: 12/02/2016 CLINICAL DATA:  Hx of encephalopathy due to HIV/AIDS, COPD, acute Bacterial Meningitis; Pt intubated at this time EXAM: CT HEAD WITHOUT CONTRAST TECHNIQUE: Contiguous axial images were obtained from the base of the skull through the vertex without intravenous contrast. COMPARISON:  08/27/2015 FINDINGS: Brain: No evidence of acute infarction, hemorrhage, hydrocephalus, extra-axial collection or mass lesion/mass effect. Vascular: No hyperdense  vessel or unexpected calcification. Skull: Normal.  Negative for fracture or focal lesion. Sinuses/Orbits: Layering debris in the sphenoid sinus. Mild mucoperiosteal thickening and debris in bilateral maxillary sinuses. Mastoid air cells are aerated. Orbits unremarkable. Other: None. IMPRESSION: 1. Negative for bleed or other acute intracranial process. 2. Mild bilateral maxillary and sphenoid sinus disease. Electronically Signed   By: Lucrezia Europe M.D.   On: 12/02/2016 11:49   Mr Jodene Nam Head Wo Contrast  Result Date: 12/09/2016 CLINICAL DATA:  Altered mental status, follow-up multiple embolic infarcts. Cerebral spinal fluid positive for HSV. History of HIV/aids. EXAM: MRA HEAD WITHOUT CONTRAST MRA NECK WITHOUT AND WITH CONTRAST TECHNIQUE: Angiographic images of the Circle of Willis were obtained using MRA technique without intravenous contrast. Angiographic images of the neck were obtained using MRA technique without and with intravenous contrast. Carotid stenosis measurements (when applicable) are obtained utilizing NASCET criteria, using the distal internal carotid diameter as the denominator. CONTRAST:  20 cc MultiHance COMPARISON:  MRI of the head December 04, 2016 FINDINGS: MRA HEAD FINDINGS ANTERIOR CIRCULATION: Thready irregular RIGHT cervical internal carotid artery through the level of the carotid siphon. Robust LEFT internal carotid artery. Bilateral anterior cerebral arteries arise from LEFT A1-2 junction. Normal appearance of the anterior and middle cerebral arteries. No large vessel occlusion, high-grade stenosis, abnormal luminal irregularity, aneurysm. POSTERIOR CIRCULATION: LEFT vertebral artery is dominant. Basilar artery is patent, with normal flow related enhancement of the main branch vessels. Normal flow related enhancement of the posterior cerebral arteries. No large vessel occlusion, high-grade stenosis, abnormal luminal irregularity, aneurysm. ANATOMIC VARIANTS: None. MRA NECK FINDINGS- delayed  phase, venous contamination. ANTERIOR CIRCULATION: The common carotid arteries are widely patent bilaterally. The carotid bifurcation is patent bilaterally and there is no hemodynamically significant carotid stenosis by NASCET criteria. Beaded appearance of the RIGHT cervical internal carotid artery to the intracranial segments. POSTERIOR CIRCULATION: Bilateral vertebral arteries are patent to the vertebrobasilar junction. No evidence for atherosclerosis or flow limiting stenosis. Bilateral posterior communicating arteries present. Source images and MIP image were reviewed. IMPRESSION: MRA HEAD: Irregular RIGHT internal carotid artery, vessel remains patent. No emergent large vessel occlusion or severe stenosis. No intracranial vasculopathy by MRA though catheter angiography is more sensitive. MRA NECK: Limited due to delayed phase. Beaded appearance of the RIGHT cervical internal carotid artery most compatible with fibromuscular dysplasia though focal HIV vasculopathy is possible. Electronically Signed   By: Elon Alas M.D.   On: 12/09/2016 02:37   Mr Jodene Nam Neck W Contrast  Result Date: 12/09/2016 CLINICAL DATA:  Altered mental status, follow-up multiple embolic infarcts. Cerebral spinal fluid positive for HSV. History of HIV/aids. EXAM: MRA HEAD WITHOUT CONTRAST MRA NECK WITHOUT AND WITH CONTRAST TECHNIQUE: Angiographic images of the Circle of Willis were obtained using MRA technique without intravenous contrast. Angiographic images of the neck were obtained using MRA technique without and with intravenous contrast. Carotid stenosis measurements (when applicable) are obtained utilizing NASCET criteria, using the distal internal carotid diameter as the denominator. CONTRAST:  20 cc MultiHance COMPARISON:  MRI of the head December 04, 2016 FINDINGS: MRA HEAD FINDINGS ANTERIOR CIRCULATION: [place] irregular RIGHT cervical internal carotid artery through the level of the carotid siphon. Robust LEFT internal  carotid artery. Bilateral anterior cerebral arteries arise from LEFT A1-2 junction. Normal appearance of the anterior and middle cerebral arteries. No large vessel occlusion, high-grade stenosis, abnormal luminal irregularity, aneurysm. POSTERIOR CIRCULATION: LEFT vertebral artery is dominant. Basilar artery is patent, with normal flow related enhancement of the main branch vessels. Normal flow related enhancement of the  posterior cerebral arteries. No large vessel occlusion, high-grade stenosis, abnormal luminal irregularity, aneurysm. ANATOMIC VARIANTS: None. MRA NECK FINDINGS- delayed phase, venous contamination. ANTERIOR CIRCULATION: The common carotid arteries are widely patent bilaterally. The carotid bifurcation is patent bilaterally and there is no hemodynamically significant carotid stenosis by NASCET criteria. Beaded appearance of the RIGHT cervical internal carotid artery to the intracranial segments. POSTERIOR CIRCULATION: Bilateral vertebral arteries are patent to the vertebrobasilar junction. No evidence for atherosclerosis or flow limiting stenosis. Bilateral posterior communicating arteries present. Source images and MIP image were reviewed. IMPRESSION: MRA HEAD: Irregular RIGHT internal carotid artery, vessel remains patent. No emergent large vessel occlusion or severe stenosis. No intracranial vasculopathy by MRA though catheter angiography is more sensitive. MRA NECK: Limited due to delayed phase. Beaded appearance of the RIGHT cervical internal carotid artery most compatible with fibromuscular dysplasia though focal HIV vasculopathy is possible. Electronically Signed   By: Elon Alas M.D.   On: 12/09/2016 02:37   [name] MC Contrast  Result Date: 12/04/2016 CLINICAL DATA:  Acute encephalopathy.  HIV and substance abuse. EXAM: MRI HEAD WITHOUT AND WITH CONTRAST TECHNIQUE: Multiplanar, multiecho pulse sequences of the brain and surrounding structures were obtained without and with  intravenous contrast. CONTRAST:  78m MULTIHANCE GADOBENATE DIMEGLUMINE 529 MG/ML IV SOLN COMPARISON:  Two days ago FINDINGS: Brain: Small foci of restricted diffusion which appear parenchymal and do not have rim enhancement, most consistent with acute infarcts. Clustered small infarcts seen in the left more than right cerebellar tonsils and inferior vermis. Small bilateral inferior frontal cortical infarcts. Small infarcts in the bilateral upper insula and on the left frontal and right occipital convexities. On FLAIR imaging there is hyperintensity following sulci which could be related to patient's meningitis (debris) or intubation with increased FiO2. No debris seen within the ventricular system. No hydrocephalus. Vascular: Major vessels are patent. Symmetric prominence of the superior ophthalmic veins which may be related to Valsalva as the cavernous sinus region is unremarkable. The right cervical and skullbase ICA is smaller than the left. This may be related to hypoplastic right A1 segment as there is also matching baseline canal asymmetry on 08/27/2015 head CT. Skull and upper cervical spine: No visible discitis. Sinuses/Orbits: Remote blowout fracture of the medial wall left orbit. Fluid levels throughout the paranasal sinuses. Patient is intubated. No visible intracranial extension to explain meningitis history. IMPRESSION: 1. 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 This pattern could be related to vasospasm from the patient's meningitis, or from central/cardiac embolic disease. 2. Sulcal signal could be meningitis debris or artifact from elevated FiO2. Negative for intracranial collection or intraventricular debris. 3. History of HSV positive CSF PCR. No typical temporal/limbic encephalitis pattern. 4. Generalized sinusitis and mastoiditis. Electronically Signed   By: JMonte FantasiaM.D.   On: 12/04/2016 18:00   Mr Cervical Spine W Wo Contrast  Result Date: 12/11/2016 CLINICAL  DATA:  Paraplegia. Evaluate for abscess or post LP epidural hematoma. HIV and cocaine use. EXAM: MRI TOTAL SPINE WITHOUT AND WITH CONTRAST TECHNIQUE: Multisequence MR imaging of the spine from the cervical spine to the sacrum was performed prior to and following IV contrast administration for evaluation of spinal metastatic disease. CONTRAST:  259mMULTIHANCE GADOBENATE DIMEGLUMINE 529 MG/ML IV SOLN COMPARISON:  None. FINDINGS: MRI CERVICAL SPINE FINDINGS Alignment: Physiologic. Vertebrae: No fracture, evidence of discitis, or bone lesion. Hypointense marrow usually from anemia. Cord: The cord at has normal signal and morphology, but there is diffuse superficial enhancement in  which also extends along the bilateral nerve roots. 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 There is also folia enhancement and superficial brainstem enhancement compatible with meningitis. Intubation with fluid throughout the paranasal sinuses and airway above the cuff. Disc levels: No herniation or impingement. MRI THORACIC SPINE FINDINGS Alignment:  Physiologic. Vertebrae: No fracture, evidence of discitis, or bone lesion. Hypointense marrow likely from anemia Cord: Predominately dorsal dural thickening throughout the thoracic canal. Diffuse cord signal abnormality with patchy appearance mainly along the posterior half of the peripheral cord. No notable swelling. Some of these areas enhance, including at in the T5 and T6 region. Direct infection or patchy infarct from perforators (expected the dorsal cord vessels would be greater involved in this patient who has been intubated and supine) Paraspinal and other soft tissues: Consolidation in both lower lobes. Disc levels: No herniation or impingement. MRI LUMBAR SPINE FINDINGS Segmentation:   Standard. Alignment:  Physiologic. Vertebrae: No fracture, evidence of discitis, or bone lesion. Hypointense marrow likely from anemia. Conus medullaris: Extends to the L1 level and appears normal. Paraspinal and other soft tissues: Negative Disc levels: Mild disc desiccation and bulging at L3-4 and L4-5. Findings are most likely infectious, both by imaging and based on clinical circumstances. Neurosarcoid can have similar findings, but usually and a different clinical setting. There is no specific imaging feature to differentiate bacterial, viral, and helminth infection. IMPRESSION: 1. No abscess or hematoma as questioned clinically. 2. Generalized meningitis and radiculitis. 3. 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 4. Findings are nonspecific. In addition to the pathogens considered in the chart, would also check CMV. Neurosarcoid can have the same findings, but usually in a different clinical setting. Electronically Signed   By: Monte Fantasia M.D.   On: 12/11/2016 13:49   Mr Thoracic Spine W Wo Contrast  Result Date: 12/11/2016 CLINICAL DATA:  Paraplegia. Evaluate for abscess or post LP epidural hematoma. HIV and cocaine use. EXAM: MRI TOTAL SPINE WITHOUT AND WITH CONTRAST TECHNIQUE: Multisequence MR imaging of the spine from the cervical spine to the sacrum was performed prior to and following IV contrast administration for evaluation of spinal metastatic disease. CONTRAST:  63m MULTIHANCE GADOBENATE DIMEGLUMINE 529 MG/ML IV SOLN COMPARISON:  None. FINDINGS: MRI CERVICAL SPINE FINDINGS Alignment: Physiologic. Vertebrae: No fracture, evidence of discitis, or bone lesion. Hypointense marrow usually from anemia. Cord: The cord at has normal signal and morphology, but there is diffuse superficial enhancement in which also extends along the bilateral nerve roots. 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 There is also folia enhancement and superficial brainstem enhancement compatible with meningitis. Intubation with fluid throughout the paranasal sinuses and airway above the cuff. Disc levels: No herniation or impingement. MRI THORACIC SPINE FINDINGS Alignment:  Physiologic. Vertebrae: No fracture, evidence of discitis, or bone lesion. Hypointense marrow likely from anemia Cord: Predominately dorsal dural thickening throughout the thoracic canal. Diffuse cord signal abnormality with patchy appearance mainly along the posterior half of the peripheral cord. No notable swelling. Some of these areas enhance, including at in the T5 and T6 region. Direct infection or patchy infarct from perforators (expected the dorsal cord vessels would be greater involved in  this patient who has been intubated and supine) Paraspinal and other soft tissues: Consolidation in both lower lobes. Disc levels: No herniation or impingement. MRI LUMBAR SPINE FINDINGS Segmentation:  Standard. Alignment:  Physiologic. Vertebrae: No fracture, evidence of discitis, or bone lesion. Hypointense marrow likely from anemia. Conus medullaris: Extends to the L1 level and appears normal. Paraspinal and other soft tissues: Negative Disc levels: Mild disc desiccation and bulging at L3-4 and L4-5. Findings are most likely infectious, both by imaging and based on clinical circumstances. Neurosarcoid can have similar findings, but usually and a different clinical setting. There is no specific imaging feature to differentiate bacterial, viral, and helminth infection. IMPRESSION: 1. No abscess or hematoma as questioned clinically. 2. Generalized meningitis and radiculitis. 3. 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 4. Findings are nonspecific. In addition to the pathogens  considered in the chart, would also check CMV. Neurosarcoid can have the same findings, but usually in a different clinical setting. Electronically Signed   By: Monte Fantasia M.D.   On: 12/11/2016 13:49   Mr Lumbar Spine W Wo Contrast  Result Date: 12/11/2016 CLINICAL DATA:  Paraplegia. Evaluate for abscess or post LP epidural hematoma. HIV and cocaine use. EXAM: MRI TOTAL SPINE WITHOUT AND WITH CONTRAST TECHNIQUE: Multisequence MR imaging of the spine from the cervical spine to the sacrum was performed prior to and following IV contrast administration for evaluation of spinal metastatic disease. CONTRAST:  74m MULTIHANCE GADOBENATE DIMEGLUMINE 529 MG/ML IV SOLN COMPARISON:  None. FINDINGS: MRI CERVICAL SPINE FINDINGS Alignment: Physiologic. Vertebrae: No fracture, evidence of discitis, or bone lesion. Hypointense marrow usually from anemia. Cord: The cord at has normal signal and morphology, but there is diffuse superficial enhancement in which also extends along the bilateral nerve roots. 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 There is also folia enhancement and superficial brainstem enhancement compatible with meningitis. Intubation with fluid throughout the paranasal sinuses and airway above the cuff. Disc levels: No herniation or impingement. MRI THORACIC SPINE FINDINGS Alignment:  Physiologic. Vertebrae: No fracture, evidence of discitis, or bone lesion. Hypointense marrow likely from anemia Cord: Predominately dorsal dural thickening throughout the thoracic canal. Diffuse cord signal abnormality with patchy appearance mainly along the posterior half of the peripheral cord. No notable swelling. Some of these areas enhance, including at in the T5 and T6 region. Direct infection or patchy infarct from  perforators (expected the dorsal cord vessels would be greater involved in this patient who has been intubated and supine) Paraspinal and other soft tissues: Consolidation in both lower lobes. Disc levels: No herniation or impingement. MRI LUMBAR SPINE FINDINGS Segmentation:  Standard. Alignment:  Physiologic. Vertebrae: No fracture, evidence of discitis, or bone lesion. Hypointense marrow likely from anemia. Conus medullaris: Extends to the L1 level and appears normal. Paraspinal and other soft tissues: Negative Disc levels: Mild disc desiccation and bulging at L3-4 and L4-5. Findings are most likely infectious, both by imaging and based on clinical circumstances. Neurosarcoid can have similar findings, but usually and a different clinical setting. There is no specific imaging feature to differentiate bacterial, viral, and helminth infection. IMPRESSION: 1. No abscess or hematoma as questioned clinically. 2. Generalized meningitis and radiculitis. 3. 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 4. Findings are nonspecific. In addition to the pathogens considered in  the chart, would also check CMV. Neurosarcoid can have the same findings, but usually in a different clinical setting. Electronically Signed   By: Monte Fantasia M.D.   On: 12/11/2016 13:49   Dg Chest Port 1 View  Result Date: 12/23/2016 CLINICAL DATA:  51 year old male with history of cough. EXAM: PORTABLE CHEST 1 VIEW COMPARISON:  Chest x-ray 12/19/2016. FINDINGS: Worsening patchy multifocal airspace disease in the left mid to lower lung and at the right lung base, concerning for progressive multilobar bronchopneumonia. Probable small left pleural effusion appears slightly increased compared to the prior study. No definite right pleural effusion. No evidence of pulmonary edema. Heart size appears borderline to mildly enlarged. Upper mediastinal contours are within normal limits. Feeding tube extends into  the stomach, but the tip is below the lower margin of the images. Previously noted nasogastric tube has been removed. Tracheostomy tube in place with tip terminating 5.8 cm above the carina. IMPRESSION: 1. Worsening bilateral lower lobe multilobar bronchopneumonia (left greater than right) with small left parapneumonic pleural effusion. 2. Support apparatus, as above. Electronically Signed   By: Vinnie Langton M.D.   On: 12/23/2016 13:20   Dg Chest Port 1 View  Result Date: 12/19/2016 CLINICAL DATA:  Respiratory distress EXAM: PORTABLE CHEST 1 VIEW COMPARISON:  12/16/2016 FINDINGS: Support devices are unchanged. Heart is borderline enlarged. Left lower lobe atelectasis or infiltrate with small left effusion. This is similar to prior study. No focal opacity on the right. IMPRESSION: Stable left lower lobe atelectasis or infiltrate with small left effusion. Electronically Signed   By: Rolm Baptise M.D.   On: 12/19/2016 07:32   Dg Chest Port 1 View  Result Date: 12/16/2016 CLINICAL DATA:  Acute respiratory failure EXAM: PORTABLE CHEST 1 VIEW COMPARISON:  12/15/2016 FINDINGS: Cardiac shadow is stable. Feeding catheter and tracheostomy tube are again seen. The left jugular central line is not well appreciated on this exam. Persistent left retrocardiac density is noted. No focal infiltrate is seen on the right. No pneumothorax is seen. IMPRESSION: Persistent left basilar infiltrate. Electronically Signed   By: Inez Catalina M.D.   On: 12/16/2016 07:54   Dg Chest Port 1 View  Result Date: 12/15/2016 CLINICAL DATA:  Pulmonary edema. EXAM: PORTABLE CHEST 1 VIEW COMPARISON:  12/13/2016 and 12/12/2016 FINDINGS: Tracheostomy tube is in place. Left jugular vein catheter is unchanged with the tip at the junction of the jugular vein and left innominate vein. Pulmonary vascular congestion has improved. The infiltrate and effusion at the left lung base process. New slight atelectasis at the right lung base. IMPRESSION: 1.  A persistent infiltrate and effusion at the left lung base, stable. 2. New slight atelectasis at the right lung base. 3. Improved pulmonary vascular prominence. Electronically Signed   By: Lorriane Shire M.D.   On: 12/15/2016 07:34   Dg Chest Port 1 View  Result Date: 12/13/2016 CLINICAL DATA:  Respiratory failure EXAM: PORTABLE CHEST 1 VIEW COMPARISON:  12/12/2016 FINDINGS: Tracheostomy tube is noted. Left jugular central line is again seen and stable. The nasogastric catheter has been removed. Left retrocardiac density is noted consistent with atelectasis/infiltrate. Small left pleural effusion is noted. Mild vascular congestion is noted. IMPRESSION: Stable left retrocardiac infiltrate and small effusion. Mild vascular congestion is seen. Electronically Signed   By: Inez Catalina M.D.   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 Respiratory failure. Initial encounter.  EXAM: PORTABLE CHEST 1 VIEW COMPARISON:  12/09/2016 and earlier. FINDINGS: Portable AP semi upright view at 0443 hours. Endotracheal tube tip between the level the clavicles and carina. Enteric tube courses to the abdomen and side hole projects at the level of the gastric body. Stable left IJ central line, tip projects at the subclavian and jugular venous confluence. Stable lung volumes. Stable cardiac size and mediastinal contours. Continued dense retrocardiac opacity obscuring the left hemidiaphragm. Mild superimposed bilateral perihilar reticulonodular opacity. No pneumothorax or pulmonary edema. Ventilation has not significantly changed since 12/08/2016. IMPRESSION: 1. Stable lines and tubes. Note that the left IJ central line tip is at the confluence of the left subclavian and jugular veins. 2. Since 12/08/2016 there is Left lower lobe collapse or consolidation with superimposed bilateral perihilar reticulonodular opacity. Favor  bilateral pneumonia. Electronically Signed   By: Genevie Ann M.D.   On: 12/12/2016 07:05   Dg Chest Port 1 View  Result Date: 12/09/2016 CLINICAL DATA:  Intubation.  Shortness of breath. EXAM: PORTABLE CHEST 1 VIEW COMPARISON:  12/08/2016. FINDINGS: Endotracheal tube and NG tube in stable position. Stable cardiomegaly. Bilateral pulmonary infiltrates/edema are again noted, slightly progressed from prior exam. Small left pleural effusion noted on today's exam. No pneumothorax . IMPRESSION: 1. Lines and tubes in stable position. 2. Stable cardiomegaly. 3. Bilateral pulmonary infiltrates/edema, slightly progressed from prior exam. Small left pleural effusion noted on today's exam . Electronically Signed   By: Marcello Moores  Register   On: 12/09/2016 07:18   Dg Chest Port 1 View  Result Date: 12/08/2016 CLINICAL DATA:  Intubation. EXAM: PORTABLE CHEST 1 VIEW COMPARISON:  12/07/2016. FINDINGS: Interim advancement of NG tube below left hemidiaphragm. Endotracheal tube, left IJ line in stable position. Cardiomegaly. Bibasilar pulmonary infiltrates. No pleural effusion or pneumothorax. IMPRESSION: 1. Interim advancement of NG tube below left hemidiaphragm. Endotracheal tube and left IJ line stable position. 2.  Bibasilar atelectasis and infiltrates again noted.  No change. Electronically Signed   By: Marcello Moores  Register   On: 12/08/2016 06:48   Dg Chest Port 1 View  Result Date: 12/07/2016 CLINICAL DATA:  Intubated, shortness of breath, respiratory failure EXAM: PORTABLE CHEST 1 VIEW COMPARISON:  12/06/2016 FINDINGS: Endotracheal tube is 5.4 cm above the carina. NG tube has retracted and is within the mid esophagus. Left jugular peripheral IV is stable in position. Stable heart size and vascularity. Mild streaky bibasilar atelectasis/airspace disease, worse on the left. No significant interval change. No large effusion or pneumothorax. Overall stable exam. IMPRESSION: Interval retraction of the NG tube which is within the mid  esophagus. Persistent bibasilar atelectasis/patchy airspace process. Basilar pneumonia not excluded. No developing effusion or pneumothorax. Electronically Signed   By: Jerilynn Mages.  Shick M.D.   On: 12/07/2016 08:01   Dg Chest Port 1 View  Result Date: 12/06/2016 CLINICAL DATA:  Intubation. EXAM: PORTABLE CHEST 1 VIEW COMPARISON:  Yesterday FINDINGS: Endotracheal tube tip is at the clavicular heads. An orogastric tube reaches the stomach. Stable left neck central line positioning. Presuming venous placement the tip is in the region of the left brachiocephalic vein. Improved left basilar aeration with better visualized diaphragm. Patchy interstitial and airspace opacities at both bases. No pneumothorax or pleural effusion. Normal heart size. IMPRESSION: 1. Stable positioning of tubes and central line. 2. Bilateral pneumonia and/or atelectasis. Improved inflation at the left base compared yesterday. Electronically Signed   By: Monte Fantasia M.D.   On: 12/06/2016 06:48   Dg Chest Port 1 View  Result Date: 12/05/2016  CLINICAL DATA:  Acute respiratory failure. EXAM: PORTABLE CHEST 1 VIEW COMPARISON:  12/04/2016 FINDINGS: Support apparatus is unchanged. Cardiomediastinal silhouette is normal. Mediastinal contours appear intact. There is no evidence of pleural effusion or pneumothorax. Improvement in the aeration of the lungs with residual peribronchial opacities predominantly in the left lower lobe. Osseous structures are without acute abnormality. Soft tissues are grossly normal. IMPRESSION: Improved aeration of the lungs with residual peribronchial opacities predominantly in the left lower lobe. Stable support apparatus. Electronically Signed   By: Fidela Salisbury M.D.   On: 12/05/2016 07:27   Dg Chest Port 1 View  Result Date: 12/04/2016 CLINICAL DATA:  Endotracheal tube EXAM: PORTABLE CHEST 1 VIEW COMPARISON:  12/03/2016 FINDINGS: Endotracheal tube in good position. Left jugular central venous catheter tip in  the left innominate vein unchanged. NG tube in the stomach. Mild progression of bibasilar airspace disease. No edema or effusion. IMPRESSION: Endotracheal tube in good position Mild progression of bibasilar atelectasis. Electronically Signed   By: Franchot Gallo M.D.   On: 12/04/2016 07:11   Dg Chest Port 1 View  Result Date: 12/03/2016 CLINICAL DATA:  Endotracheal tube placement.  Initial encounter. EXAM: PORTABLE CHEST 1 VIEW COMPARISON:  Chest radiograph performed earlier today at 5:12 p.m. FINDINGS: The patient's endotracheal tube is seen ending 8 cm above the carina. An enteric tube is noted extending below the diaphragm. A left IJ line is noted ending about the left brachiocephalic vein. Mild bibasilar airspace opacities likely reflect atelectasis. No pleural effusion or pneumothorax is seen. The cardiomediastinal silhouette is normal in size. No acute osseous abnormalities are identified. IMPRESSION: 1. Endotracheal tube seen ending 8 cm above the carina. This could be advanced 4-5 cm. 2. Mild bibasilar airspace opacities likely reflect atelectasis. Electronically Signed   By: Garald Balding M.D.   On: 12/03/2016 20:39   Dg Chest Port 1 View  Result Date: 12/03/2016 CLINICAL DATA:  Endotracheal tube repositioning with further advancement EXAM: PORTABLE CHEST 1 VIEW COMPARISON:  None. FINDINGS: The tip of an endotracheal tube is 7 cm above the carina at the level of the clavicular heads in the upper limits of normal. Mild interstitial edema. Normal size cardiac silhouette. No aortic aneurysm. No pneumothorax. Left IJ central line catheter is unchanged in position with tip projecting over the left medial clavicular head. IMPRESSION: 1. Endotracheal tube tip is 7 cm above the carina. No significant change left IJ central line catheter with tip projecting the medial left clavicular head as before. 2. Slight increase in interstitial edema since prior exam. No pneumothorax. Electronically Signed   By: Ashley Royalty M.D.   On: 12/03/2016 17:33   Dg Chest Port 1 View  Result Date: 12/03/2016 CLINICAL DATA:  Advanced endotracheal tube EXAM: PORTABLE CHEST 1 VIEW COMPARISON:  Earlier today FINDINGS: Endotracheal tube tip in good position at the clavicular heads. An orogastric tube at least reaches the diaphragm. Left IJ central line with tip left of midline at the level of the left clavicular head. Arterial and venous structures overlap in this location. Normal heart size and mediastinal contours. Mild interstitial coarsening at the bases is stable. No edema, visible effusion, or pneumothorax. IMPRESSION: 1. Improved endotracheal tube positioning, tip at the clavicular heads. 2. Elsewhere stable chest. Electronically Signed   By: Monte Fantasia M.D.   On: 12/03/2016 16:18   Dg Chest Port 1 View  Result Date: 12/03/2016 CLINICAL DATA:  Endotracheal tube adjustment.  Hypoxia. EXAM: PORTABLE CHEST 1 VIEW COMPARISON:  12/03/2016  at 3:30 a.m. FINDINGS: Endotracheal tube has been pulled back as tip is now 13.8 cm above the carina. This could be advanced another 7 cm. Nasogastric tube appears to also been pulled back with tip over the distal esophagus. Left IJ central venous catheter unchanged with tip in the region of the brachiocephalic vein. Lungs are adequately inflated with stable mild hazy left base opacification which may be due to atelectasis/effusion or infection. Cardiomediastinal silhouette and remainder the exam is unchanged. IMPRESSION: Stable hazy left base opacification which may be due to atelectasis/ effusion versus infection. Tubes and lines as described. Note that the endotracheal tube and nasogastric tubes have been pulled back as described. Findings discussed with patient's nurse, Tamala Fothergill, at the time of dictation. Electronically Signed   By: Marin Olp M.D.   On: 12/03/2016 16:02   Dg Chest Port 1 View  Result Date: 12/03/2016 CLINICAL DATA:  Check endotracheal tube EXAM: PORTABLE CHEST 1  VIEW COMPARISON:  12/02/2016 FINDINGS: Cardiac shadow is stable. Endotracheal tube and nasogastric catheter are again seen and stable. Increasing left basilar density is noted consistent with evolving infiltrate no other focal infiltrate is seen. No sizable effusion is noted. No bony abnormality is seen. IMPRESSION: Evolving left basilar infiltrate. Electronically Signed   By: Inez Catalina M.D.   On: 12/03/2016 08:45   Dg Chest Port 1 View  Result Date: 12/02/2016 CLINICAL DATA:  Intubation. EXAM: PORTABLE CHEST 1 VIEW COMPARISON:  12/01/2016 . FINDINGS: Interim placement of NG tube, its tip is below left hemidiaphragm P Endotracheal tube, left IJ line in stable position. Heart size stable . Low lung volumes with bibasilar atelectasis. Left base infiltrate cannot be excluded. No pleural effusion or pneumothorax. IMPRESSION: 1. Interim placement NG tube, its tip is below the left hemidiaphragm. 2. Low lung volumes with bibasilar atelectasis. Left base infiltrate cannot be excluded. Electronically Signed   By: Marcello Moores  Register   On: 12/02/2016 07:06   Dg Abd Portable 1v  Result Date: 12/23/2016 CLINICAL DATA:  Enteric tube placement EXAM: PORTABLE ABDOMEN - 1 VIEW COMPARISON:  Abdominal radiograph from earlier today FINDINGS: Weighted enteric tube terminates in the right upper quadrant of the abdomen in the region of the pylorus. No dilated small bowel loops. No evidence of pneumatosis or pneumoperitoneum. Patchy left lung base opacity. IMPRESSION: Weighted enteric tube terminates in the right upper quadrant in the region of the pylorus. Nonobstructive bowel gas pattern. Patchy left lung base opacity. Electronically Signed   By: Ilona Sorrel M.D.   On: 12/23/2016 17:19   Dg Abd Portable 1v  Result Date: 12/22/2016 CLINICAL DATA:  Assess feeding tube position. EXAM: PORTABLE ABDOMEN - 1 VIEW COMPARISON:  KUB of December 20, 2016 FINDINGS: The radiodense tip of the feeding tube lies in the region of the  pylorus or first portion of the duodenum. There is some looping of the feeding tube in the gastric cardia. The bowel gas pattern is within the limits of normal. The bony structures exhibit no acute abnormalities. IMPRESSION: There has been distal migration of the feeding tube since at the tip lies in the region of the pylorus or first portion of the duodenum. Electronically Signed   By: David  Martinique M.D.   On: 12/22/2016 15:31   Dg Abd Portable 1v  Result Date: 12/20/2016 CLINICAL DATA:  Ileus EXAM: PORTABLE ABDOMEN - 1 VIEW COMPARISON:  To 09/2017 FINDINGS: Feeding tube tip in the duodenum near the ligament of Treitz, unchanged. Small bowel dilatation has progressed.  Mild small bowel distention. Gas in the colon without dilatation. NG tube in the stomach. IMPRESSION: Mildly dilated small bowel loops with progression since the prior study. Electronically Signed   By: Franchot Gallo M.D.   On: 12/20/2016 06:41   Dg Abd Portable 1v  Result Date: 12/19/2016 CLINICAL DATA:  Ileus EXAM: PORTABLE ABDOMEN - 1 VIEW COMPARISON:  12/18/2016 FINDINGS: NG tube is in the stomach. Feeding tube tip in the distal duodenum. Nonobstructive bowel gas pattern. Gas throughout nondistended large and small bowel. No free air organomegaly. IMPRESSION: Improved bowel distention.  Nonobstructive pattern currently. Feeding tube tip remains in the distal duodenum. Electronically Signed   By: Rolm Baptise M.D.   On: 12/19/2016 09:12   Dg Abd Portable 1v  Result Date: 12/18/2016 CLINICAL DATA:  Ileus. EXAM: PORTABLE ABDOMEN - 1 VIEW COMPARISON:  12/17/2016. FINDINGS: Improved gaseous distention of the abdomen. Support tubes and apparatus appears stable. IMPRESSION: Improved gaseous distention. Electronically Signed   By: Staci Righter M.D.   On: 12/18/2016 08:26   Dg Abd Portable 1v  Result Date: 12/17/2016 CLINICAL DATA:  Followup abdominal distension. EXAM: PORTABLE ABDOMEN - 1 VIEW FINDINGS: There is an NG tube with its tip in  the antropyloric region of the stomach. There is also a feeding tube with its tip in the fourth portion of the duodenum. The lung bases are grossly clear. Persistent air distended small bowel and colon suggesting a diffuse ileus. No free air. IMPRESSION: Persistent diffuse ileus bowel gas pattern. NG and feeding tubes in good position. Electronically Signed   By: Marijo Sanes M.D.   On: 12/17/2016 09:39   Dg Abd Portable 1v  Result Date: 12/16/2016 CLINICAL DATA:  Assess nasogastric tube positioning. EXAM: PORTABLE ABDOMEN - 1 VIEW COMPARISON:  KUB of December 16, 2016 at 9:22 a.m. FINDINGS: The radiodense tipped feeding tube tip projects below the inferior margin of the image. The orogastric tube has its proximal port in the gastric cardia with the tip re-curved upon itself pointing toward the GE junction. There are loops of moderately distended gas-filled small and large bowel present. There is dense atelectasis or pneumonia in the left lower lobe. IMPRESSION: The tip of the feeding tube projects below the inferior margin of the image. Both the proximal port and the tip of the orogastric tube lie in the region of the gastric cardia but the tip of the tube is directed back toward the GE junction. Electronically Signed   By: David  Martinique M.D.   On: 12/16/2016 15:58   Dg Abd Portable 1v  Result Date: 12/16/2016 CLINICAL DATA:  Abdominal distention EXAM: PORTABLE ABDOMEN - 1 VIEW COMPARISON:  December 14, 2016 FINDINGS: Feeding tube tip is in the third portion of the duodenum, unchanged. There are multiple loops of dilated bowel without bowel wall thickening. No free air evident. IMPRESSION: Bowel gas pattern is concerning for ileus or possibly early bowel obstruction. No demonstrable free air. Feeding tube tip in third portion duodenum. Electronically Signed   By: Lowella Grip III M.D.   On: 12/16/2016 09:37   Dg Abd Portable 1v  Result Date: 12/14/2016 CLINICAL DATA:  Nasogastric tube placement. EXAM:  PORTABLE ABDOMEN - 1 VIEW COMPARISON:  12/07/2016 FINDINGS: Nasogastric tube enters the stomach in has its tip at the proximal fourth portion of the duodenum. Upper abdominal bowel gas pattern is unremarkable. Atelectasis persists at the left base. IMPRESSION: Nasogastric tube tip at the proximal fourth portion of the duodenum. Electronically Signed  By: Nelson Chimes M.D.   On: 12/14/2016 16:26   Dg Abd Portable 1v  Result Date: 12/07/2016 CLINICAL DATA:  Adjusted OG tube EXAM: PORTABLE ABDOMEN - 1 VIEW COMPARISON:  12/01/2016 FINDINGS: Enteric tube terminates in the proximal gastric body. Nonobstructive bowel gas pattern. Visualized osseous structures are within normal limits. IMPRESSION: Enteric tube terminates in the proximal gastric body. Electronically Signed   By: Julian Hy M.D.   On: 12/07/2016 09:20   Dg Abd Portable 1v  Result Date: 12/01/2016 CLINICAL DATA:  51 year old male. Evaluate for enteric tube placement. EXAM: PORTABLE ABDOMEN - 1 VIEW COMPARISON:  Abdominal radiograph dated 12/01/2016 FINDINGS: An enteric tube is partially visualized with tip located in the left upper abdomen, likely in the proximal stomach. There is no bowel dilatation or evidence of obstruction. No free air or radiopaque calculi identified. The soft tissues and osseous structures appear unremarkable. IMPRESSION: Enteric tube with tip in the left upper abdomen likely in the proximal stomach. Electronically Signed   By: Anner Crete M.D.   On: 12/01/2016 22:52    Time Spent in minutes  30   Jani Gravel M.D on 12/26/2016 at 5:55 AM  Between 7am to 7pm - Pager - [phone]  After 7pm go to [email or web address] - password [facility]  [facility] -  Office  [phone]

## 2016-12-26 NOTE — Progress Notes (Signed)
PIV access obtained for pt.  Pt shaking head no to attempt.  Explained need to pt. For IV.  D/w pt PICC and PIV.  Refused PICC but agreeable to PIV.  Obtained with pts consent.

## 2016-12-26 NOTE — Progress Notes (Signed)
Unable to get an IV started. As per MDs order will place an order for PICC line

## 2016-12-26 NOTE — Progress Notes (Signed)
Pt nonverbally told us he thought he was having a stroke. Informed charge Charity fundraiserN. Rapid Response notified, and MD paged. Pt is in a sinus rhythm at HR80. Awaiting response.

## 2016-12-26 NOTE — Progress Notes (Signed)
Went in room to assess patient. He had an opened bottle of saline in his arms, putting it up to his mouth, as if to drink it. I took the saline bottle from him, threw it away and reminded him that he was to have nothing to eat or drink until after speech evaluates him. He remained calm. Will continue to monitor patient . Safety is maintained.

## 2016-12-27 ENCOUNTER — Inpatient Hospital Stay (HOSPITAL_COMMUNITY): Payer: Medicaid Other

## 2016-12-27 DIAGNOSIS — Z7189 Other specified counseling: Secondary | ICD-10-CM

## 2016-12-27 LAB — BASIC METABOLIC PANEL
Anion gap: 9 (ref 5–15)
BUN: 15 mg/dL (ref 6–20)
CHLORIDE: 103 mmol/L (ref 101–111)
CO2: 24 mmol/L (ref 22–32)
CREATININE: 0.61 mg/dL (ref 0.61–1.24)
Calcium: 8.3 mg/dL — ABNORMAL LOW (ref 8.9–10.3)
GFR calc Af Amer: >60 mL/min (ref >60)
GFR calc non Af Amer: >60 mL/min (ref >60)
GLUCOSE: 153 mg/dL — AB (ref 65–99)
Potassium: 4 mmol/L (ref 3.5–5.1)
SODIUM: 136 mmol/L (ref 135–145)

## 2016-12-27 LAB — CBC
HEMATOCRIT: 38 % — AB (ref 39.0–52.0)
Hemoglobin: 12.1 g/dL — ABNORMAL LOW (ref 13.0–17.0)
MCH: 30.2 pg (ref 26.0–34.0)
MCHC: 31.8 g/dL (ref 30.0–36.0)
MCV: 94.8 fL (ref 78.0–100.0)
PLATELETS: 289 10*3/uL (ref 150–400)
RBC: 4.01 MIL/uL — ABNORMAL LOW (ref 4.22–5.81)
RDW: 17.5 % — AB (ref 11.5–15.5)
WBC: 7.6 10*3/uL (ref 4.0–10.5)

## 2016-12-27 LAB — GLUCOSE, CAPILLARY
GLUCOSE-CAPILLARY: 192 mg/dL — AB (ref 65–99)
GLUCOSE-CAPILLARY: 222 mg/dL — AB (ref 65–99)
Glucose-Capillary: 108 mg/dL — ABNORMAL HIGH (ref 65–99)
Glucose-Capillary: 144 mg/dL — ABNORMAL HIGH (ref 65–99)
Glucose-Capillary: 174 mg/dL — ABNORMAL HIGH (ref 65–99)
Glucose-Capillary: 89 mg/dL (ref 65–99)

## 2016-12-27 MED ORDER — RESOURCE THICKENUP CLEAR PO POWD
ORAL | Status: DC | PRN
  Filled 2016-12-27 (×5): qty 125

## 2016-12-27 MED ORDER — DOCUSATE SODIUM 50 MG/5ML PO LIQD: 100.0000 mg | Freq: Two times a day (BID) | ORAL | Status: DC | PRN

## 2016-12-27 MED ORDER — EMTRICITABINE-TENOFOVIR AF 200-25 MG PO TABS
1.0000 | ORAL_TABLET | Freq: Every day | ORAL | Status: DC
  Administered 2016-12-28 – 2017-01-04 (×8): 1 via ORAL
  Filled 2016-12-27 (×8): qty 1

## 2016-12-27 MED ORDER — LACTULOSE 10 GM/15ML PO SOLN
10.0000 g | Freq: Two times a day (BID) | ORAL | Status: DC
  Administered 2016-12-27 – 2017-01-04 (×15): 10 g via ORAL
  Filled 2016-12-27 (×16): qty 15

## 2016-12-27 MED ORDER — FLUCONAZOLE 100 MG PO TABS
200.0000 mg | ORAL_TABLET | Freq: Every day | ORAL | Status: DC
  Administered 2016-12-28 – 2016-12-30 (×3): 200 mg via ORAL
  Filled 2016-12-27 (×4): qty 2

## 2016-12-27 MED ORDER — PRO-STAT SUGAR FREE PO LIQD
30.0000 mL | Freq: Two times a day (BID) | ORAL | Status: DC
  Administered 2016-12-27 – 2016-12-28 (×2): 30 mL via ORAL
  Filled 2016-12-27 (×2): qty 30

## 2016-12-27 MED ORDER — SENNOSIDES 8.8 MG/5ML PO SYRP
5.0000 mL | ORAL_SOLUTION | Freq: Two times a day (BID) | ORAL | Status: DC | PRN
  Filled 2016-12-27 (×2): qty 5

## 2016-12-27 MED ORDER — CLONAZEPAM 0.5 MG PO TABS
0.5000 mg | ORAL_TABLET | Freq: Three times a day (TID) | ORAL | Status: DC
  Administered 2016-12-27 – 2016-12-28 (×3): 0.5 mg via ORAL
  Filled 2016-12-27 (×4): qty 1

## 2016-12-27 MED ORDER — ENSURE ENLIVE PO LIQD
237.0000 mL | Freq: Once | ORAL | Status: AC
  Administered 2016-12-27: 237 mL via ORAL

## 2016-12-27 MED ORDER — SULFAMETHOXAZOLE-TRIMETHOPRIM 200-40 MG/5ML PO SUSP
20.0000 mL | Freq: Every day | ORAL | Status: DC
  Administered 2016-12-28 – 2017-01-04 (×8): 20 mL via ORAL
  Filled 2016-12-27 (×9): qty 20

## 2016-12-27 MED ORDER — ASPIRIN 325 MG PO TABS
325.0000 mg | ORAL_TABLET | Freq: Every day | ORAL | Status: DC
  Administered 2016-12-28 – 2017-01-04 (×8): 325 mg via ORAL
  Filled 2016-12-27 (×8): qty 1

## 2016-12-27 NOTE — Progress Notes (Addendum)
Physical Therapy Treatment Patient Details Name: DENIEL MCQUISTON MRN: [id number] DOB: [birth year] Today's Date: 12/27/2016    History of Present Illness 51 yo admitted to [facility] on 1/24 with AMS and intubated. 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 Pt with bil LE flaccid since admission. PMHx:HIV/AIDS, Hep C, Hep B, COPD, cocaine use     PT Comments    Pt remains debilitated due to immunocompromised history, unhealing wounds and decreased strength from underlying ailments.  Pt presents with innability to activate or initiate muscles contraction but sensation remains intact.  Pt will not require SNF level rehab to improve  Strength and functional mobility and decreased caregiver burden.  Will plan to perform core strengthening and seated sitting balance next session.     Follow Up Recommendations  SNF;Supervision/Assistance - 24 hour     Equipment Recommendations  Wheelchair (measurements PT);Wheelchair cushion (measurements PT);Hospital bed    Recommendations for Other Services       Precautions / Restrictions Precautions Precautions: Fall Precaution Comments: Trach collar, and Bil LEs flaccid Restrictions Weight Bearing Restrictions: No    Mobility  Bed Mobility Overal bed mobility: Needs Assistance Bed Mobility: Supine to Sit;Sit to Supine     Supine to sit: Max assist;+2 for physical assistance Sit to supine: Max assist;+2 for physical assistance   General bed mobility comments: Pt remains to require max assist +2 to advance LEs off the bed in prep for sitting edge of bed.  Pt requires increased time and assist to elevate trunk into sitting.  Sitting edge of bed x 6 min patient requires cues for head control, trunk control, and head and trunk extension.    Transfers Overall transfer level:  (remains unsafe to transfer at this time secondary to his innability to  maintain static sitting without support.  )                  Ambulation/Gait                 Stairs            Wheelchair Mobility    Modified Rankin (Stroke Patients Only)       Balance Overall balance assessment: Needs assistance   Sitting balance-Leahy Scale: Poor Sitting balance - Comments: static sitting x6 min when attempting to let go hard LOB to R, with poor motivation to maintain sitting.  Pt requires mod assist to maintain sitting.   Postural control: Posterior lean;Right lateral lean   Standing balance-Leahy Scale:  (n/a)                      Cognition Arousal/Alertness: Awake/alert Behavior During Therapy: WFL for tasks assessed/performed Overall Cognitive Status: Difficult to assess                 General Comments: Pt able to follow one step commands and actively participates with mobility. Pt unable to verbalize due to trach, but does nod head in response to questions    Exercises Other Exercises Other Exercises: Heel cord/ HS stetch 3x30 seconds, quad stretch 3x30 sec.  1x5 reps: sit ups in bed holding to lower bed rails.  Educated patient to perform x5 per hour in efforts to improve trunk control.        General Comments        Pertinent Vitals/Pain Pain Assessment: Faces Pain Score: 9  Faces Pain Scale: Hurts  a little bit Pain Location: B hips and legs with stretching.   Pain Descriptors / Indicators: Discomfort Pain Intervention(s): Monitored during session;Repositioned    Home Living                      Prior Function            PT Goals (current goals can now be found in the care plan section) Acute Rehab PT Goals Patient Stated Goal: pt unable to state PT Goal Formulation: Patient unable to participate in goal setting Potential to Achieve Goals: Fair Progress towards PT goals: Progressing toward goals (slowly)    Frequency    Min 3X/week      PT Plan Discharge plan needs to be  updated    Co-evaluation             End of Session   Activity Tolerance: Patient limited by fatigue;Patient limited by lethargy Patient left: in bed;with call bell/phone within reach;with bed alarm set Nurse Communication: Mobility status;Patient requests pain meds (requesting to be suctioned, requesting milk shake.  )       Time: 1308-6578: 1205-1241 PT Time Calculation (min) (ACUTE ONLY): 36 min  Charges:  $Therapeutic Exercise: 8-22 mins $Therapeutic Activity: 8-22 mins                    G Codes:       Florestine Aversimee J Levaeh Vice 12/27/2016, 1:07 PM  Joycelyn RuaAimee Xianna Siverling, PTA pager [phone]

## 2016-12-27 NOTE — NC FL2 (Signed)
[place] LEVEL OF CARE SCREENING TOOL     IDENTIFICATION  Patient Name: Lawrence Kane Birthdate: 1966-02-22 Sex: male Admission Date (Current Location): 12/01/2016  [place] and [place] Number:  [name] [id number] [place] and Address:  [facility] [place] [address] [place], [place] [address]      Provider Number: [id number]  Attending Physician Name and Address:  Reyne Dumas, MD  Relative Name and Phone Number:  Neoma Laming [phone]    Current Level of Care: Hospital Recommended Level of Care: [place] Prior Approval Number:    Date Approved/Denied:   PASRR Number: [id number]  Discharge Plan: SNF    Current Diagnoses: Patient Active Problem List   Diagnosis Date Noted  . Goals of care, counseling/discussion   . Palliative care encounter   . Respiratory distress   . Agitation   . Hypernatremia   . Leukocytosis   . Acute blood loss anemia   . Pain   . Acute respiratory failure with hypoxia ([place])   . Tracheostomy status ([place])   . Hypoxemia   . Muscle weakness (generalized)   . Ileus ([place])   . Abdominal distention   . Transverse myelitis ([place])   . Encounter for orogastric (OG) tube placement   . Somnolence   . Paraplegia ([place])   . Acute pulmonary edema (HCC)   . Neurosyphilis   . Herpesviral meningitis   . Cerebral embolism with cerebral infarction 12/08/2016  . Endotracheally intubated   . Meningoencephalitis   . Acute encephalopathy   . Acute respiratory failure ([place]) 12/02/2016  . Altered mental status   . COPD (chronic obstructive pulmonary disease) ([place]) 12/01/2016  . Substance abuse 12/01/2016  . Genital warts 12/01/2016  . HIV (human immunodeficiency virus infection) ([place]) 12/01/2016  . AIDS (acquired immune deficiency syndrome) ([place]) 12/01/2016  . Noncompliance 12/01/2016  . Bacterial meningitis 12/01/2016  . Hypertensive urgency 12/01/2016  . Septic shock ([place]) 12/01/2016  .  Transaminitis 12/01/2016  . Cocaine abuse 12/01/2016  . Open wound 12/01/2016    Orientation RESPIRATION BLADDER Height & Weight     Self, Time, Situation, Place  Tracheostomy, O2 (currently on trach with room air) Incontinent, Indwelling catheter Weight: 182 lb 12.8 oz (82.9 kg) Height:  6' 4"  (193 cm)  BEHAVIORAL SYMPTOMS/MOOD NEUROLOGICAL BOWEL NUTRITION STATUS   (NONE)   Incontinent Diet (dysphasia I diet- pudding thick liquids)  AMBULATORY STATUS COMMUNICATION OF NEEDS Skin   Extensive Assist Verbally Skin abrasions (abrasion on scrotum/ warts- requring gauze dressing changes PRN)                       Personal Care Assistance Level of Assistance  Bathing, Dressing, Feeding Bathing Assistance: Maximum assistance Feeding assistance: Limited assistance Dressing Assistance: Maximum assistance Total Care Assistance:  (NA)   Functional Limitations Info  Speech Sight Info: Adequate Hearing Info: Adequate Speech Info: Impaired (uses PMV but very soft speech at this time and still having to mouth most words)    [place]  PT (By licensed PT), OT (By licensed OT)     PT Frequency: 5/wk OT Frequency: 5/wk            Contractures Contractures Info: Not present    Additional Factors Info  Code Status, Allergies, Suctioning Needs, Isolation Precautions, Psychotropic, Insulin Sliding Scale Code Status Info: FULL Allergies Info: Iodine, Ketorolac, Tylenol Acetaminophen Psychotropic Info: klonopin, ativan Insulin Sliding Scale Info: 8/day Isolation Precautions Info: ESBL, MRSA  Suctioning Needs: q4   Current Medications (12/27/2016):  This is the current hospital active medication list Current Facility-Administered Medications  Medication Dose Route Frequency Provider Last Rate Last Dose  . acetaminophen (TYLENOL) tablet 650 mg  650 mg Oral Q4H PRN Velna Ochs, MD   650 mg at 12/12/16 2139  . [START ON 12/28/2016] aspirin tablet 325 mg  325 mg  Oral Daily Reyne Dumas, MD      . chlorhexidine gluconate (MEDLINE KIT) (PERIDEX) 0.12 % solution 15 mL  15 mL Mouth Rinse BID Laverle Hobby, MD   15 mL at 12/26/16 2155  . chlorproMAZINE (THORAZINE) 25 mg in sodium chloride 0.9 % 25 mL IVPB  25 mg Intravenous Q8H PRN Florinda Marker, MD   25 mg at 12/07/16 2121  . clonazePAM (KLONOPIN) tablet 0.5 mg  0.5 mg Oral TID Reyne Dumas, MD      . darunavir-cobicistat (PREZCOBIX) 800-150 MG per tablet 1 tablet  1 tablet Oral Q breakfast Brand Males, MD   1 tablet at 12/27/16 0843  . dextrose 5 % and 0.9 % NaCl with KCl 20 mEq/L infusion   Intravenous Continuous Reyne Dumas, MD 75 mL/hr at 12/27/16 0600    . docusate (COLACE) 50 MG/5ML liquid 100 mg  100 mg Oral BID PRN Reyne Dumas, MD      . dolutegravir (TIVICAY) tablet 50 mg  50 mg Oral Daily Velna Ochs, MD   50 mg at 12/27/16 1307  . [START ON 12/28/2016] emtricitabine-tenofovir AF (DESCOVY) 200-25 MG per tablet 1 tablet  1 tablet Oral Daily Reyne Dumas, MD      . feeding supplement (JEVITY 1.2 CAL) liquid 1,000 mL  1,000 mL Per Tube Continuous Rush Farmer, MD   Stopped at 12/24/16 1832  . feeding supplement (PRO-STAT SUGAR FREE 64) liquid 30 mL  30 mL Oral BID Reyne Dumas, MD      . fentaNYL (DURAGESIC - dosed mcg/hr) 50 mcg  50 mcg Transdermal Q72H Raylene Miyamoto, MD   50 mcg at 12/26/16 1150  . fentaNYL (SUBLIMAZE) injection 50 mcg  50 mcg Intravenous Q2H PRN Verner Mould, MD   50 mcg at 12/27/16 1323  . [START ON 12/28/2016] fluconazole (DIFLUCAN) tablet 200 mg  200 mg Oral Daily Reyne Dumas, MD      . free water 200 mL  200 mL Per Tube Q6H Bonnielee Haff, MD   Stopped at 12/24/16 2323  . guaiFENesin-dextromethorphan (ROBITUSSIN DM) 100-10 MG/5ML syrup 5 mL  5 mL Oral Q4H PRN Reyne Dumas, MD      . haloperidol lactate (HALDOL) injection 5 mg  5 mg Intravenous Q6H PRN Raylene Miyamoto, MD   5 mg at 12/22/16 1430  . heparin injection 5,000 Units  5,000 Units  Subcutaneous Q8H Velna Ochs, MD   5,000 Units at 12/27/16 1357  . hydrALAZINE (APRESOLINE) injection 10 mg  10 mg Intravenous Q6H PRN Praveen Mannam, MD   10 mg at 12/24/16 0424  . insulin aspart (novoLOG) injection 0-15 Units  0-15 Units Subcutaneous Q4H Bonnielee Haff, MD   2 Units at 12/27/16 0506  . insulin glargine (LANTUS) injection 10 Units  10 Units Subcutaneous BID Reyne Dumas, MD   10 Units at 12/27/16 1035  . lactulose (CHRONULAC) 10 GM/15ML solution 10 g  10 g Oral BID Reyne Dumas, MD      . LORazepam (ATIVAN) injection 2 mg  2 mg Intravenous Q6H Raylene Miyamoto, MD   2 mg at 12/27/16  5277  . MEDLINE mouth rinse  15 mL Mouth Rinse QID Laverle Hobby, MD   15 mL at 12/27/16 0512  . meropenem (MERREM) 2 g in sodium chloride 0.9 % 100 mL IVPB  2 g Intravenous Q8H Rebecka Apley, RPH   2 g at 12/27/16 0855  . methylPREDNISolone sodium succinate (SOLU-MEDROL) 40 mg/mL injection 20 mg  20 mg Intravenous Q24H Reyne Dumas, MD   20 mg at 12/26/16 1824   Followed by  . [START ON 12/29/2016] methylPREDNISolone sodium succinate (SOLU-MEDROL) 40 mg/mL injection 20 mg  20 mg Intravenous QODAY Reyne Dumas, MD      . pantoprazole (PROTONIX) injection 40 mg  40 mg Intravenous Q24H Reyne Dumas, MD   40 mg at 12/27/16 1321  . RESOURCE THICKENUP CLEAR   Oral PRN Reyne Dumas, MD      . scopolamine (TRANSDERM-SCOP) 1 MG/3DAYS 1.5 mg  1 patch Transdermal Q72H Reyne Dumas, MD   1.5 mg at 12/26/16 1330  . sennosides (SENOKOT) 8.8 MG/5ML syrup 5 mL  5 mL Oral BID PRN Reyne Dumas, MD      . Derrill Memo ON 12/28/2016] sulfamethoxazole-trimethoprim (BACTRIM,SEPTRA) 200-40 MG/5ML suspension 20 mL  20 mL Oral Daily Reyne Dumas, MD      . vancomycin (VANCOCIN) 1,250 mg in sodium chloride 0.9 % 250 mL IVPB  1,250 mg Intravenous Q8H Rebecka Apley, RPH   1,250 mg at 12/27/16 1049   Facility-Administered Medications Ordered in Other Encounters  Medication Dose Route Frequency Provider Last Rate Last Dose   . chlorhexidine gluconate (MEDLINE KIT) (PERIDEX) 0.12 % solution 15 mL  15 mL Mouth Rinse BID [place], MD      . MEDLINE mouth rinse  15 mL Mouth Rinse 10 times per day Rush Landmark, MD         Discharge Medications: Please see discharge summary for a list of discharge medications.  Relevant Imaging Results:  Relevant Lab Results:   Additional Information SS [id number]  Jorge Ny, LCSW

## 2016-12-27 NOTE — Progress Notes (Signed)
Modified Barium Swallow Progress Note  Patient Details  Name: Lawrence Kane MRN: [id number] Date of Birth: July 24, 1966  Today's Date: 12/27/2016  Modified Barium Swallow completed.  Full report located under Chart Review in the Imaging Section.  Brief recommendations include the following:  Clinical Impression  MBS completed wearing Passy-Muir speaking valve. Pharyngeal impairments primarily originate from decreased sensation with minimal motor deficits. Valleculae was site of swallow initiation for most trials. Pyriform sinsus appear shallow and small with min-mild residue silently aspirated after the swallow. Larygneal penetration consistent during the swallow with honey thick barium without awareness. Recommend Dys 1 (puree) only and pudding thick liquids (nothing thinner than puree/pudding), donn speaking valve with all meals/meds, slow rate, small bites.     Swallow Evaluation Recommendations       SLP Diet Recommendations: Dysphagia 1 (Puree) solids;Pudding thick liquid   Liquid Administration via: Spoon   Medication Administration: Crushed with puree   Supervision: Patient able to self feed;Full assist for feeding   Compensations: Slow rate;Small sips/bites   Postural Changes: Seated upright at 90 degrees   Oral Care Recommendations: Oral care BID   Other Recommendations: Order thickener from pharmacy    Royce MacadamiaLitaker, [place] Milley Willis 12/27/2016,10:45 AM   Breck CoonsLisa Willis Lonell FaceLitaker M.Ed [employer]SLP Pager [phone]

## 2016-12-27 NOTE — Progress Notes (Signed)
Daily Progress Note   Patient Name: Lawrence Kane       Date: 12/27/2016 DOB: 30-May-1966  Age: 51 y.o. MRN#: [id number] Attending Physician: Reyne Dumas, MD Primary Care Physician: No primary care provider on file. Admit Date: 12/01/2016  Reason for Consultation/Follow-up: Establishing goals of care and Psychosocial/spiritual support  Subjective: Lawrence Kane is alert and fully oriented in bed. He was very frustrated by his diet restrictions (had MBS study earlier and diet orders placed), and repeatedly asked me for more puddings and "just a little regular water." He also c/o feeling very weak, tired, and worn out from "everything that is wrong with me. The new stuff and all of the old stuff."  Length of Stay: 26  Current Medications: Scheduled Meds:  . aspirin  325 mg Per Tube Daily  . chlorhexidine gluconate (MEDLINE KIT)  15 mL Mouth Rinse BID  . clonazePAM  0.5 mg Per Tube TID  . darunavir-cobicistat  1 tablet Oral Q breakfast  . dolutegravir  50 mg Oral Daily  . emtricitabine-tenofovir AF  1 tablet Per Tube Daily  . feeding supplement (PRO-STAT SUGAR FREE 64)  30 mL Per Tube BID  . fentaNYL  50 mcg Transdermal Q72H  . fluconazole  200 mg Per Tube Daily  . free water  200 mL Per Tube Q6H  . heparin  5,000 Units Subcutaneous Q8H  . insulin aspart  0-15 Units Subcutaneous Q4H  . insulin glargine  10 Units Subcutaneous BID  . lactulose  10 g Per Tube BID  . LORazepam  2 mg Intravenous Q6H  . mouth rinse  15 mL Mouth Rinse QID  . meropenem (MERREM) IV  2 g Intravenous Q8H  . methylPREDNISolone (SOLU-MEDROL) injection  20 mg Intravenous Q24H   Followed by  . [START ON 12/29/2016] methylPREDNISolone (SOLU-MEDROL) injection  20 mg Intravenous QODAY  . pantoprazole (PROTONIX) IV  40 mg Intravenous Q24H  . scopolamine  1 patch Transdermal  Q72H  . sulfamethoxazole-trimethoprim  20 mL Per Tube Daily  . vancomycin  1,250 mg Intravenous Q8H    Continuous Infusions: . dextrose 5 % and 0.9 % NaCl with KCl 20 mEq/L 75 mL/hr at 12/27/16 0600  . feeding supplement (JEVITY 1.2 CAL) Stopped (12/24/16 1832)    PRN Meds: acetaminophen, chlorproMAZINE (THORAZINE) IV, docusate, fentaNYL (SUBLIMAZE) injection, guaiFENesin-dextromethorphan, haloperidol lactate, hydrALAZINE, sennosides  Physical Exam  Constitutional: He is oriented to person, place, and time. He appears well-developed and well-nourished. No distress.  HENT:  Left ear with healing skin tear, healing lesions on lips. Tongue with white coating, he would not let me attempt oral care, so unclear if this is thrush versus pudding residue  Eyes: EOM are normal.  Neck: Normal range of motion. Neck supple.  Trach present on room air  Cardiovascular: Normal rate.   Pulmonary/Chest: Effort normal. He has no wheezes. He has rhonchi (throughout).  Trach with PMV in place. Thick secretions expectorated through trach. Site benign.  Abdominal: Soft. Bowel sounds are normal. He exhibits no distension. There is no tenderness.  Musculoskeletal:  Generalized weakness. Full movement of BUE. He did not want to move BLE when requested, but told me he could.   Neurological: He is  alert and oriented to person, place, and time.  Orientation a little questionable. When first asked he told me he was in [place], [place] and the year was 2020. When asked again he said [facility] in [place] . He would not respond on the year.   Skin: Skin is warm and dry.  Known skin breakdown on scrotum, however he refused evaluation  Psychiatric: His speech is normal and behavior is normal. Thought content normal. His affect is blunt. He expresses impulsivity and inappropriate judgment. He exhibits abnormal recent memory.           Vital Signs: BP 133/83 (BP Location: Right Arm)   Pulse 70   Temp 98 F (36.7 C) (Oral)    Resp 18   Ht 6' 4" (1.93 m)   Wt 82.9 kg (182 lb 12.8 oz)   SpO2 98%   BMI 22.25 kg/m  SpO2: SpO2: 98 % O2 Device: O2 Device: Not Delivered O2 Flow Rate: O2 Flow Rate (L/min): 6 L/min  Intake/output summary:  Intake/Output Summary (Last 24 hours) at 12/27/16 7517 Last data filed at 12/27/16 0553  Gross per 24 hour  Intake           2097.5 ml  Output              400 ml  Net           1697.5 ml   LBM: Last BM Date: 12/26/16 Baseline Weight: Weight: 93 kg (205 lb) Most recent weight: Weight: 82.9 kg (182 lb 12.8 oz)       Palliative Assessment/Data: PPS 50%   Flowsheet Rows   Flowsheet Row Most Recent Value  Intake Tab  Referral Department  Hospitalist  Unit at Time of Referral  Med/Surg Unit  Palliative Care Primary Diagnosis  Sepsis/Infectious Disease  Date Notified  12/24/16  Palliative Care Type  New Palliative care  Reason for referral  Clarify Goals of Care  Date of Admission  12/01/16  Date first seen by Palliative Care  12/25/16  # of days Palliative referral response time  1 Day(s)  # of days IP prior to Palliative referral  23  Clinical Assessment  Palliative Performance Scale Score  30%  Psychosocial & Spiritual Assessment  Palliative Care Outcomes  Patient/Family meeting held?  Yes  Who was at the meeting?  patient and mother on the phone  Palliative Care Outcomes  Clarified goals of care      Patient Active Problem List   Diagnosis Date Noted  . Palliative care encounter   . Respiratory distress   . Agitation   . Hypernatremia   . Leukocytosis   . Acute blood loss anemia   . Pain   . Acute respiratory failure with hypoxia ([place])   . Tracheostomy status ([place])   . Hypoxemia   . Muscle weakness (generalized)   . Ileus ([place])   . Abdominal distention   . Transverse myelitis ([place])   . Encounter for orogastric (OG) tube placement   . Somnolence   . Paraplegia ([place])   . Acute pulmonary edema ([facility])   . Neurosyphilis   . Herpesviral meningitis     . Cerebral embolism with cerebral infarction 12/08/2016  . Endotracheally intubated   . Meningoencephalitis   . Acute encephalopathy   . Acute respiratory failure ([place]) 12/02/2016  . Altered mental status   . COPD (chronic obstructive pulmonary disease) ([place]) 12/01/2016  . Substance abuse 12/01/2016  . Genital warts 12/01/2016  . HIV (human immunodeficiency  virus infection) ([place]) 12/01/2016  . AIDS (acquired immune deficiency syndrome) ([place]) 12/01/2016  . Noncompliance 12/01/2016  . Bacterial meningitis 12/01/2016  . Hypertensive urgency 12/01/2016  . Septic shock ([place]) 12/01/2016  . Transaminitis 12/01/2016  . Cocaine abuse 12/01/2016  . Open wound 12/01/2016    Palliative Care Assessment & Plan   HPI: 51 y.o. male  with past medical history of HIV/AIDS, chronic Hep B, COPD, Osteoporosis, genital warts, and cocaine use who originally presented to [facility] with AMS and agitation. He was intubated for airway protection and transferred to [facility]. He was admitted here on 12/01/2016. 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 He was intubated for acute respiratory failure, and a tracheostomy was placed. He did develop an ileus, however repeatedly pulled out his NG tube. Swallow evaluation pending.   Assessment: I met Iam at his bedside. I introduced myself and explained my role on his care team. I specifically explained that I was there to help clarify what his wishes were for his care, and his expectations for the future. He was appreciative of the visit and mentioned he felt very lonely.  In our long conversation he gave me some history about what health issues he has been dealing with, as well as significant life events that frustrated him. He told me that he had expected to die 20 years ago, and has been living  with the feeling that his time was limited since that point (no specific reason why 20 years ago, he just said it was the point when his health seemed to be worse). He is struggling to understand what is happening this hospitalization, wtih patchy memory of events. I reviewed the medical conditions he presented with, the management so far, and the general plan for after discharge. He very quickly told me that discharge planning would not be an issue. He was convinced that he will die in the next 24-48 hours. He wasn't able to tell me anything beyond that, he just had a feeling it would happen. I explored this belief further, and he was able to tell me that he wasn't afraid of death, he'd been expecting it for a while, and he was ready for it when it happened. I asked what we should do in the event that his heart or lungs stopped, and he was very clear that he should be allowed to die without interference; he does not want chest compressions, defibrillation, or going back on the breathing machine. I asked him his wishes three different times in our conversation, and he consistently said the same thing: "let me die without hurting me further."  As I was leaving the room his mother, brother, and sister-in-law had just arrived. He did give me permission to speak with his family and answer any questions they may have. Per their request, I returned to the bedside in the mid-afternoon to review his medical course. They had spoke with Dr. Allyson Sabal already, and had a few follow-up questions. I re-addressed code status again, and this time he asked that his mother make the final decision. She wants to speak to him further, and asked that I call her in the morning for the final decision.   Recommendations/Plan:  Full code, continue full scope interventions  I will call mother tomorrow for code status follow-up  Plan for d/c to SNF for rehab; pt very focused on getting the  trach out ASAP   Goals of Care and Additional  Recommendations:  Limitations on Scope of Treatment: Full Scope Treatment  Code Status:  Full code  Prognosis:   Unable to determine  Discharge Planning:  [place] for rehab with Palliative care service follow-up  Care plan was discussed with pt, mother, sister-in-law, brother, and care nurse.  Thank you for allowing the Palliative Medicine Team to assist in the care of this patient.  Time in/out: 1100/1200; 1415/1500  Total time: 105 minutes    Greater than 50%  of this time was spent counseling and coordinating care related to the above assessment and plan.  Charlynn Court, NP Palliative Medicine Team [phone] pager (7a-5p) Team Phone # [phone]

## 2016-12-27 NOTE — Progress Notes (Addendum)
[facility] PROGRESS NOTE  Lawrence Kane HFW:[id number] DOB: Apr 23, 1966 DOA: 12/01/2016  PCP: No primary care provider on file.  Brief History/Interval Summary:Lawrence Kane is a 51 y.o. male with history of COPD, HIV, substance abuse, noncompliance, recent surgery in [place];  who was admitted via [facility] on 12/01/16 with AMS changes and septic shock due to bacterial meningitis. He was intubated for airway protection and started on broad spectrum antibiotics as well as acyclovir for scrotal lesions. LP with high protein, low glucose and high WBC with predominance of eosinophils. ID consulted and questioned parasitic infection. Repeat LP 1/29 showed elevated glucose, elevated WBC with 28% eosinophils. BAL cytology/wet mount negative for malignant cells or parasites. MRI brain done 1/27 due to ongoing decrease in level of responsiveness, decrease in movement of BUE and flaccid BLE. MRI brain reviewed, showing small infarcts in cerebellum and cortex.  ?due to vasospasms or central/embolic disease and generalized sinuitis/mastoidits. Neurology questioned HSV vasculopathy and recommended MRA brain for work up. MRA brain revealed irregular R-ICA no large vessel stenosis and beaded appearance of right cervical ICA compatible with fibromuscular dysplasia.  Infarcts felt to be due to vasculitis/vasospams secondary to meningoencephalitis. MRI total spine was negative for abscess or hematoma, generalized meningitis and radiculitis and multifocal dorsal thoracic cord signal abnormality from myelitis. 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   He has had loose stools due to partial SBO v/s ileus. Agitation improving. He has completed treatment for  ESBL K-pneumoniae in BAL.   He was unable to tolerate vent wean requiring tracheostomy 2/5. He has been weaned to ATC for past 2 days but continues to have  copious purulent secretions via trach as well as restraints due to poor safety. Therapy ongoing and CIR recommended for follow up therapy.     . Subsequently, patient developed an ileus. NG tube was placed to intermittent suction.  Patient has pulled out his NG tube multiple times this admission. Still unable to swallow. Speech therapy consultation is pending     Assessment/Plan:  Acute Hypoxemic Respiratory Failure , Tracheostomy 2/5 This was secondary to HCAP ESBL Klebsiella LLL+ sepsis + Unable to protect airway + pulm edema. Patient was on the ventilator for a prolonged duration. Subsequently underwent tracheostomy placement on 2/5. Pulmonology is following. Patient is off of the Precedex infusion.   Was on trach collar all day yesterday and tolerated it well. Starting to have mucopurulent secretions, patient received multiple courses of antibiotic between 1/24-2/7. Chest x-ray 2/15 showed worsening bronchopneumonia. Started patient on Robitussin. Concern for recurrent pneumonia/tracheobronchitis remains . previous 1/28 BAL gram stain/Cx: 40K ESBL Klebsiella pneumoniae, therefore patient restarted on meropenem and vancomycin 2/15, will continue through 2/22. Discussed with PCCM , , patient will continue with cuffed tracheostomy tube in case he needs to go back on the ventilator Proceed with establishing PO intake without replacing NG tube  Speech therapy recommendations pending    Hypoglycemia Reduced dose of lantus to 10 units BID   Dysarthria,  R/o for acute CVA, MRI of the brain last night was negative  Pending modified barium swallow   Scrotal wound/history of genital warts Left scrotal area wound is deeper with odor and the head of the penis has wart like growths that area much worse than at the time of admission Started on fluconazole IV Periwound: various wart like lesion, patient with history of  genital warts known at the time of admission May need urology consult Awaiting  records from outside facility, details of what kind of surgery he had CALLED DR [place]   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 HSV positive on [facility] from [facility]. Infectious disease has been following. Patient is on acyclovir. He will need this for total of 21 days. Will complete treatment on 2/17.  Continue Bactrim for PCP prophylaxis.  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 Patient is on acyclovir. High-dose steroids 1000 mg/day , given for 5 days. Now on low-dose steroid. This will need to be slowly tapered down. Neurology has seen the patient. On fentanyl patch for pain issues. Started during this hospitalization.Re-image spine in 2-3 weeks to assess for improvement. Neurology signed off 2/12.  Ileus Improved. Patient had NG tube placed 2/8. Patient has removed his NG several times.  Continue laxatives.   DM, Hyperglycemia Unknown if patient has a history of diabetes. HbA1c 6.6 on 1/26. CBGs were extremely elevated due to steroids. He was initially placed on Lantus. Decrease Lantus today because of borderline CBG. Patient is currently nothing by mouth.  .    Bradycardia Heart rate has improved. Bradycardia was most likely due to medication such as versed. TSH is normal. Continue to monitor.   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 On aspirin. Echocardiogram shows normal systolic function. LDL 93. PT and OT evaluation. No significant carotid stenosis noted on MRA neck.  he was seen by neurology. They had mentioned that a TEE could be considered once the patient was more stable. This can be pursued at a later date, although embolic stroke appeared to be less of a possibility based on his clinical scenario. Repeat MRI negative for acute CVA  Pulmonary edema Seems to have  improved. Continue intermittent IV Lasix,    Moderate to Severe Protein Calorie Malnutrition  Feedings on hold due to ileus/patient keeps pulling out NGT     Hypernatremia Free water to continue. Increase to every 6 hours. Repeat labs tomorrow. Tube feedings on hold. Sodium level should come down.  Transaminitis Worsened by seroquel likely which was discontinued on 1/31. Improved.   HCV ab+, Chronic hep b Management will be pursued as outpatient.  Normocytic anemia Hemoglobin stable. No evidence for overt bleeding. Continue to monitor.  LLL PNA with ESBL Klebsiella  Restarted antibiotics 2/15  HIV On antiretroviral treatment. Infectious disease was following.  Metabolic Encephalopathy Continue to monitor. Seems to be stable. PRN Sedative agents for agitation. Restraints in place to prevent him from pulling out supportive tubes.  History of Cocaine Abuse Will need counseling when able to communicate effectively  Disposition-bedside meeting with palliative care, mother/ sister  on 2/19 completed   Consultants: Infectious disease. Critical care medicine. Neurology.  STUDIES:  1/24 CXR >> ATX vs LLL infiltrate 1/25 CT head >> neg for acute intracranial process 1/25 LP cytology >> no malignant cells 1/25 LP >> yellow, cloudy, glucose 20, total protein >600, RBC 114>49, WBC 970, 36% neutrophil, 20% lymphs, 19% eos 1/28 BAL cell count >> 266 WBC, eos 1 1/28 BAL cytology >> no malignant cells 1/27 BAL for wet mount> neg for parasites 1/27 MRI brain >> small acute infarcts in cerebellum and cerebral cortex, sucal signal 1/29 LP >> Glucose 82, protein >600, WBC 600>885, eosinophils 28% 1/29  Echo >> LV EF 60-65%, indeterminent diastolic function, wall motion normal 1/30 EEG >> diffuse slowing and background suppression, no seizures 2/3 MRI thoracic/lumbar/cervical >> (-) abscess. Meningitis. R/O CMV   CULTURES: 1/24 BCx x 2 [name]): NGTD 1/24 HSV PCR [name]):  HSV2 POSITIVE 1/24 Cryptococcal [name]): negative 1/24 CSF [facility]): NGTD 1/22 scrotal wound cx [name]): MRSA Toxoplasma [name]): Negative 1/25BCx x2 : NGTD 1/25 HCV ab + 1/24 Trach asp 1/24: NGTD 1/24MRSA PCR: MRSA pos 1/25 HIV quant: 80, CD4 10 1/25 CSF AFB: Smear neg, >> 1/25 CSF fungal cx: NGTD 1/25 CSF crypto ag: neg 1/25 CSF gram stain no organisms, cx: NGTD 1/27 strongyloides ab >> negative 1/27 coccidiomycosis ab >> 1/28 BAL AFB smear/cx> 1/28 BAL Fungus cx >> 1/28 BAL gram stain/Cx: 40K ESBL Klebsiella pneumoniae 1/28 BAL pneumocystis smear: Neg 1/27 O&P >> 1/29 CSF Cx >> no organisms on gram stain, >> NGTD 2/04 CSF CMV >> not done?   ANTIBIOTICS: Rocephin 1/24>>1/30 Vancomycin 1/24>>1/30; 2/2 >  Acyclovir 1/24>> 1/26, 1/27>> Ampicillin 1/24>> 1/27 Bactrim M/W/F for PCP prophylaxis Meropenem 1/30>> 2/1; 2/2 >  Pen G 2/1 >> 2/2 Cipro 2/1 >> 2/2 Meropenem/vancomycin-2/15-  SIGNIFICANT EVENTS: 1/24  Transferred from [facility] 1/25  Underwent LP here 2/05  Trach 2/06  To ATC 2/8  patient developed ileus  LINES/TUBES: L IJ CVL 1/24 >> ETT 1/24 >> 2/5 Foley 1/24 >> OGT 1/24 >> 2/5 Trach (df) 2/5 >>   Subjective/Interval History: Chest pain last night  Hypoglycemic this am   ROS: Unable to do.  Objective:  Vital Signs  Vitals:   12/27/16 0055 12/27/16 0359 12/27/16 0500 12/27/16 0616  BP:    133/83  Pulse: 71 68  70  Resp: _0 Temp:    98 F (36.7 C)  TempSrc:    Oral  SpO2: 94% 97%  98%  Weight:   82.9 kg (182 lb 12.8 oz)   Height:        Intake/Output Summary (Last 24 hours) at 12/27/16 0837 Last data filed at 12/27/16 0553  Gross per 24 hour  Intake           2097.5 ml  Output              400 ml  Net           1697.5 ml   Filed Weights   12/25/16 0500 12/26/16 0520 12/27/16 0500  Weight: 83.3 kg (183 lb 10.3 oz) 88.3 kg (194 lb 10.7 oz) 82.9 kg (182 lb 12.8 oz)    General appearance: Patient is awake and  alert. Neck: Tracheostomy noted Resp: Good air entry bilaterally. No wheezing, rales or rhonchi. diminished at the bases.  Cardio: S1, S2, is bradycardic, regular. No S3, S4. No rubs, murmurs, or bruit. Minimal pedal edema.  GI: Abdomen remains soft. Not distended. Bowel sounds are present. No masses or organomegaly. Nontender.  Extremities: Minimal edema noted bilateral lower extremities Pulses: 2+ and symmetric Neurologic: Patient is awake and alert. Moving his upper extremities. No movement noted in the lower extremities. : Large open wound on left lateral portion of scrotum with purulent discharge. Foley in place.   Lab Results:  Data Reviewed: I have personally reviewed following labs and imaging studies  CBC:  Recent Labs Lab 12/21/16 0233 12/22/16 0730 12/23/16 0447 12/24/16 0402 12/27/16 0511  WBC 12.5* 11.2* 9.5 9.4 7.6  HGB 10.1* 10.4* 10.7* 11.0* 12.1*  HCT 33.1* 34.2* 35.3* 34.9* 38.0*  MCV 98.2 98.0  98.6 95.9 94.8  PLT 377 384 384 362 270    Basic Metabolic Panel:  Recent Labs Lab 12/22/16 0730 12/23/16 0447 12/25/16 0605 12/26/16 0622 12/27/16 0511  NA 150* 144 139 137 136  K 4.6 4.5 3.7 3.2* 4.0  CL 116* 115* 107 106 103  CO2 _0 GLUCOSE 201* 277* 119* 80 153*  BUN 36* 37* 23* 22* 15  CREATININE 0.78 0.87 0.57* 0.60* 0.61  CALCIUM 9.3 9.1 8.7* 8.5* 8.3*    GFR: Estimated Creatinine Clearance: 128.1 mL/min (by C-G formula based on SCr of 0.61 mg/dL).  Liver Function Tests:  Recent Labs Lab 12/23/16 0447 12/26/16 0622  AST 54* 55*  ALT 90* 83*  ALKPHOS 61 62  BILITOT 0.6 1.1  PROT 7.3 6.9  ALBUMIN 2.1* 2.0*     Coagulation Profile: No results for input(s): INR, PROTIME in the last 168 hours.  CBG:  Recent Labs Lab 12/26/16 1710 12/26/16 2137 12/27/16 0002 12/27/16 0426 12/27/16 0754  GLUCAP 94 118* 174* 144* 108*     No results found for this or any previous visit (from the past 240 hour(s)).    Radiology  Studies: Dg Chest 1 View  Result Date: 12/26/2016 CLINICAL DATA:  Altered mental status.  HIV disease EXAM: CHEST 1 VIEW COMPARISON:  December 23, 2016 FINDINGS: Tracheostomy catheter tip is 7.0 cm above the carina. No pneumothorax. There is airspace consolidation throughout much the left lower lobe which was also present previously but appears somewhat more coalesced that this time. There is a small left pleural effusion. Right lung is clear. Heart size and pulmonary vascularity are normal. No adenopathy. No bone lesions. IMPRESSION: Airspace consolidation consistent with pneumonia left lower lobe with small left pleural effusion. Lungs elsewhere clear. Tracheostomy as described. No pneumothorax. Cardiac silhouette within normal limits. Electronically Signed   By: Lowella Grip III M.D.   On: 12/26/2016 09:00   Mr Brain Wo Contrast  Result Date: 12/26/2016 CLINICAL DATA:  Altered mental status EXAM: MRI HEAD WITHOUT CONTRAST TECHNIQUE: Multiplanar, multiecho pulse sequences of the brain and surrounding structures were obtained without intravenous contrast. COMPARISON:  12/09/2016 FINDINGS: Brain: Resolved areas of restricted diffusion. On sagittal T1 weighted imaging there are small areas of T1 hyperintensity along the areas of previously described infarct along the sylvian fissures, likely cortical laminar necrosis. No blood products are noted. Mild FLAIR hyperintensity in the regions of fourth ventricular and left inferior cerebellar disease previously. No signs of acute or interval infarct. No swelling or evidence of sulcal/intraventricular debris. No hydrocephalus or mass. Vascular: Preserved flow voids Skull and upper cervical spine: Negative Sinuses/Orbits: Fluid levels in bilateral maxillary and sphenoid sinuses and in the left more than right mastoids. IMPRESSION: 1. No acute or interval finding. Acute findings on previous exam are resolved. 2. Expected evolution of small scattered infarcts seen  previously. 3. Bilateral paranasal sinus and mastoid fluid levels. Electronically Signed   By: Monte Fantasia M.D.   On: 12/26/2016 08:32     Medications:  Scheduled: . aspirin  325 mg Per Tube Daily  . chlorhexidine gluconate (MEDLINE KIT)  15 mL Mouth Rinse BID  . clonazePAM  0.5 mg Per Tube TID  . darunavir-cobicistat  1 tablet Oral Q breakfast  . dolutegravir  50 mg Oral Daily  . emtricitabine-tenofovir AF  1 tablet Per Tube Daily  . feeding supplement (PRO-STAT SUGAR FREE 64)  30 mL Per Tube BID  . fentaNYL  50 mcg Transdermal Q72H  .  fluconazole  200 mg Per Tube Daily  . free water  200 mL Per Tube Q6H  . heparin  5,000 Units Subcutaneous Q8H  . insulin aspart  0-15 Units Subcutaneous Q4H  . insulin glargine  10 Units Subcutaneous BID  . lactulose  10 g Per Tube BID  . LORazepam  2 mg Intravenous Q6H  . mouth rinse  15 mL Mouth Rinse QID  . meropenem (MERREM) IV  2 g Intravenous Q8H  . methylPREDNISolone (SOLU-MEDROL) injection  20 mg Intravenous Q24H   Followed by  . [START ON 12/29/2016] methylPREDNISolone (SOLU-MEDROL) injection  20 mg Intravenous QODAY  . pantoprazole (PROTONIX) IV  40 mg Intravenous Q24H  . scopolamine  1 patch Transdermal Q72H  . sulfamethoxazole-trimethoprim  20 mL Per Tube Daily  . vancomycin  1,250 mg Intravenous Q8H   Continuous: . dextrose 5 % and 0.9 % NaCl with KCl 20 mEq/L 75 mL/hr at 12/27/16 0600  . feeding supplement (JEVITY 1.2 CAL) Stopped (12/24/16 1832)   HLK:[id number], chlorproMAZINE (THORAZINE) IV, docusate, fentaNYL (SUBLIMAZE) injection, guaiFENesin-dextromethorphan, haloperidol lactate, hydrALAZINE, sennosides      DVT Prophylaxis: Subcutaneous heparin    Code Status: Full code  Family Communication: No family at bedside  Disposition Plan: not stable for discharge     LOS: 26 days   [facility] Pager [phone] 12/27/2016, 8:37 AM  If 7PM-7AM, please contact night-coverage at [email or web address],  password [facility]

## 2016-12-27 NOTE — Progress Notes (Signed)
Pharmacy Antibiotic Note Lawrence Kane is a 51 y.o. male admitted on 12/01/2016 with previous treatment of ESBL K pneumoniae on trach aspirate, possible meningitis, positive HSV2 and a groin maceration. Pt is currently on Meropenem, Vancomycin and fluconazole. Noted plan stop date of 2/22.  Plan: 1. Continue vancomycin 1250 mg IV every 8 hours 2. Postpone vancomycin trough due to missed dose on 2/18 from loss of IV access  3. Continue Meropenem 2 grams IV every 8 hours and Fluconazole 200 mg every 24 hours    Height: _0  (193 cm) Weight: 182 lb 12.8 oz (82.9 kg) IBW/kg (Calculated) : 86.8  Temp (24hrs), Avg:98.4 F (36.9 C), Min:98 F (36.7 C), Max:98.7 F (37.1 C)   Recent Labs Lab 12/21/16 0233 12/22/16 0730 12/23/16 0447 12/24/16 0402 12/25/16 0605 12/26/16 0622 12/27/16 0511  WBC 12.5* 11.2* 9.5 9.4  --   --  7.6  CREATININE 0.90 0.78 0.87  --  0.57* 0.60* 0.61    Estimated Creatinine Clearance: 128.1 mL/min (by C-G formula based on SCr of 0.61 mg/dL).    Allergies  Allergen Reactions  . Iodine   . Ketorolac     unknown  . Tylenol [Acetaminophen]     unknown    Antimicrobials this admission: Ceftriaxone 1/24 (started at OSH) >> 1/30 Ampicillin 1/25 >>1/27 Acyclovir 1/25 >>1/26; 1/27>>(2/16) Bactrim 1/25 (PCP prophylaxis) >> 1/29  2/6 >> Ciprofloxacin 2/1 >>2/2 PCN 2/1 >>2/2 Fluconazole 2/17 >> Vancomycin 1/24 (started at OSH) >> 1/30; 2/2 >>2/5; 2/15 >> Meropenem 1/30 >> 2/1; 2/2 >>2/6; 2/15 >>   Microbiology results: 1/22 scrotal wound MRSA 1/24 BCx x 2 Marland KitchenRandolph): 1/24 HSV PCR [name]): HSV2 POS 1/24 Cryptococcal [name]): 1/24 CSF [name]): gram stain no organism  1/24 Trach asp: ngtd 1/24 MRSA PCR: positive 1/25 BCx x2: ngtd 1/25 cryptococcal: negative 1/25 CSF culture (post abx): ngtd 1/25 CSF fungal Cx: neg 1/25 CSF AFB: neg 1/25 Toxoplasma: neg 1/27 stronglyoids ab: negative 1/28 fungus from bronch: none observed 1/28 AFB  bronch: neg 1/28 PCP bronch: neg 1/28 BAL: 40,000 klebsiella pneumonia ESBL. No parasites 1/29 CSF: ngtd 1/31 BCx: ng<12h 2/1 RPR: Neg CMV PCR - negative  Thank you for allowing pharmacy to be a part of this patient's care.  Vincenza Hews, PharmD, BCPS 12/27/2016, 7:53 AM

## 2016-12-27 NOTE — Progress Notes (Signed)
CSW spoke with pt, pt mom, and pt sister and updated on SNF search.  Explained that now that patient is off of ventilator we could not place in [place] unless family would be able to pay for transport (any transport over 50 miles would need to be paid for up front by family)  CSW explained that we would be restarting bed search now that patient is less acute and would update in regards to options.  CSW will continue to follow  Burna SisJenna H. Tyreka Henneke, LCSW Clinical Social Worker [phone]

## 2016-12-27 NOTE — Consult Note (Signed)
Reason for Consult: Scrotal Wound / Genital Warts  Referring Physician: Reyne Dumas MD  Lawrence Kane is an 51 y.o. male.   ASSESMENT AND PLAN:  1 - Scrotal Wound / Genital Warts -  Current wound actually looks quite good and as expected following removal of superficial but large volume condyloma. No signs of active infection or necrosis. This will continue to heal as long as kept clean. Rec only daily wet to dry dressing changes to scrotum.  Please call me directly with questions.    HPI:   1 - Scrotal Wound / Genital Warts - s/p local excision of scrotal condyloma by Urologist in [place] earlier this year.  No op notes or pathology for review. Thorough bedside exam 2/19 this admission with clean-based granulation tissue and no evidence of active infection / necrosis / obstructing condyloma. CT 12/2016 this admission w/o pelvic adenopathy / fluid collections or subQ gas.   PMH sig for transverse osteomyelitis with LE paralysis, HIV/AIDS, polysubstnace abuse, Respiratory failure / trach.   Today "Chief" is seen in consultation for above. He is making progress clinically during complex prolonged hospitalization for bacterial meningitis / cord infection and noted to have scrotal wound.   Past Medical History:  Diagnosis Date  . COPD (chronic obstructive pulmonary disease) ([place])   . Genital warts   . HIV (human immunodeficiency virus infection) ([place])   . Osteoporosis     Past Surgical History:  Procedure Laterality Date  . PROSTATE SURGERY  11/17/2016    History reviewed. No pertinent family history.  Social History:  reports that he has been smoking.  He has never used smokeless tobacco. He reports that he drinks about 0.6 oz of alcohol per week . He reports that he does not use drugs.  Allergies:  Allergies  Allergen Reactions  . Iodine   . Ketorolac     unknown  . Tylenol [Acetaminophen]     unknown    Medications: I have reviewed the patient's current  medications.  Results for orders placed or performed during the hospital encounter of 12/01/16 (from the past 48 hour(s))  Glucose, capillary     Status: None   Collection Time: 12/25/16  5:43 PM  Result Value Ref Range   Glucose-Capillary 89 65 - 99 mg/dL  Glucose, capillary     Status: None   Collection Time: 12/25/16  7:56 PM  Result Value Ref Range   Glucose-Capillary 77 65 - 99 mg/dL   Comment 1 Notify RN    Comment 2 Document in Chart   Glucose, capillary     Status: None   Collection Time: 12/26/16 12:05 AM  Result Value Ref Range   Glucose-Capillary 77 65 - 99 mg/dL   Comment 1 Notify RN    Comment 2 Document in Chart   Glucose, capillary     Status: Abnormal   Collection Time: 12/26/16  1:21 AM  Result Value Ref Range   Glucose-Capillary 116 (H) 65 - 99 mg/dL   Comment 1 Notify RN    Comment 2 Document in Chart   Glucose, capillary     Status: None   Collection Time: 12/26/16  4:00 AM  Result Value Ref Range   Glucose-Capillary 79 65 - 99 mg/dL   Comment 1 Notify RN    Comment 2 Document in Chart   Comprehensive metabolic panel     Status: Abnormal   Collection Time: 12/26/16  6:22 AM  Result Value Ref Range   Sodium 137 135 -  145 mmol/L   Potassium 3.2 (L) 3.5 - 5.1 mmol/L   Chloride 106 101 - 111 mmol/L   CO2 22 22 - 32 mmol/L   Glucose, Bld 80 65 - 99 mg/dL   BUN 22 (H) 6 - 20 mg/dL   Creatinine, Ser 0.60 (L) 0.61 - 1.24 mg/dL   Calcium 8.5 (L) 8.9 - 10.3 mg/dL   Total Protein 6.9 6.5 - 8.1 g/dL   Albumin 2.0 (L) 3.5 - 5.0 g/dL   AST 55 (H) 15 - 41 U/L   ALT 83 (H) 17 - 63 U/L   Alkaline Phosphatase 62 38 - 126 U/L   Total Bilirubin 1.1 0.3 - 1.2 mg/dL   GFR calc non Af Amer >60 >60 mL/min   GFR calc Af Amer >60 >60 mL/min    Comment: (NOTE) The eGFR has been calculated using the CKD EPI equation. This calculation has not been validated in all clinical situations. eGFR's persistently <60 mL/min signify possible Chronic Kidney Disease.    Anion gap 9  5 - 15  Troponin I (q 6hr x 3)     Status: None   Collection Time: 12/26/16  6:22 AM  Result Value Ref Range   Troponin I <0.03 <0.03 ng/mL  Glucose, capillary     Status: Abnormal   Collection Time: 12/26/16  9:28 AM  Result Value Ref Range   Glucose-Capillary 34 (LL) 65 - 99 mg/dL   Comment 1 Notify RN   Glucose, capillary     Status: None   Collection Time: 12/26/16 10:20 AM  Result Value Ref Range   Glucose-Capillary 85 65 - 99 mg/dL  Glucose, capillary     Status: Abnormal   Collection Time: 12/26/16  1:00 PM  Result Value Ref Range   Glucose-Capillary 60 (L) 65 - 99 mg/dL  Glucose, capillary     Status: None   Collection Time: 12/26/16  2:54 PM  Result Value Ref Range   Glucose-Capillary 87 65 - 99 mg/dL  Glucose, capillary     Status: None   Collection Time: 12/26/16  5:10 PM  Result Value Ref Range   Glucose-Capillary 94 65 - 99 mg/dL  Troponin I (q 6hr x 3)     Status: None   Collection Time: 12/26/16  6:04 PM  Result Value Ref Range   Troponin I <0.03 <0.03 ng/mL  Glucose, capillary     Status: Abnormal   Collection Time: 12/26/16  9:37 PM  Result Value Ref Range   Glucose-Capillary 118 (H) 65 - 99 mg/dL  Glucose, capillary     Status: Abnormal   Collection Time: 12/27/16 12:02 AM  Result Value Ref Range   Glucose-Capillary 174 (H) 65 - 99 mg/dL  Glucose, capillary     Status: Abnormal   Collection Time: 12/27/16  4:26 AM  Result Value Ref Range   Glucose-Capillary 144 (H) 65 - 99 mg/dL  Basic metabolic panel     Status: Abnormal   Collection Time: 12/27/16  5:11 AM  Result Value Ref Range   Sodium 136 135 - 145 mmol/L   Potassium 4.0 3.5 - 5.1 mmol/L   Chloride 103 101 - 111 mmol/L   CO2 24 22 - 32 mmol/L   Glucose, Bld 153 (H) 65 - 99 mg/dL   BUN 15 6 - 20 mg/dL   Creatinine, Ser 0.61 0.61 - 1.24 mg/dL   Calcium 8.3 (L) 8.9 - 10.3 mg/dL   GFR calc non Af Amer >60 >60 mL/min  GFR calc Af Amer >60 >60 mL/min    Comment: (NOTE) The eGFR has been  calculated using the CKD EPI equation. This calculation has not been validated in all clinical situations. eGFR's persistently <60 mL/min signify possible Chronic Kidney Disease.    Anion gap 9 5 - 15  CBC     Status: Abnormal   Collection Time: 12/27/16  5:11 AM  Result Value Ref Range   WBC 7.6 4.0 - 10.5 K/uL   RBC 4.01 (L) 4.22 - 5.81 MIL/uL   Hemoglobin 12.1 (L) 13.0 - 17.0 g/dL   HCT 38.0 (L) 39.0 - 52.0 %   MCV 94.8 78.0 - 100.0 fL   MCH 30.2 26.0 - 34.0 pg   MCHC 31.8 30.0 - 36.0 g/dL   RDW 17.5 (H) 11.5 - 15.5 %   Platelets 289 150 - 400 K/uL  Glucose, capillary     Status: Abnormal   Collection Time: 12/27/16  7:54 AM  Result Value Ref Range   Glucose-Capillary 108 (H) 65 - 99 mg/dL  Glucose, capillary     Status: None   Collection Time: 12/27/16 11:54 AM  Result Value Ref Range   Glucose-Capillary 89 65 - 99 mg/dL    Dg Chest 1 View  Result Date: 12/26/2016 CLINICAL DATA:  Altered mental status.  HIV disease EXAM: CHEST 1 VIEW COMPARISON:  December 23, 2016 FINDINGS: Tracheostomy catheter tip is 7.0 cm above the carina. No pneumothorax. There is airspace consolidation throughout much the left lower lobe which was also present previously but appears somewhat more coalesced that this time. There is a small left pleural effusion. Right lung is clear. Heart size and pulmonary vascularity are normal. No adenopathy. No bone lesions. IMPRESSION: Airspace consolidation consistent with pneumonia left lower lobe with small left pleural effusion. Lungs elsewhere clear. Tracheostomy as described. No pneumothorax. Cardiac silhouette within normal limits. Electronically Signed   By: Lowella Grip III M.D.   On: 12/26/2016 09:00   Mr Brain Wo Contrast  Result Date: 12/26/2016 CLINICAL DATA:  Altered mental status EXAM: MRI HEAD WITHOUT CONTRAST TECHNIQUE: Multiplanar, multiecho pulse sequences of the brain and surrounding structures were obtained without intravenous contrast.  COMPARISON:  12/09/2016 FINDINGS: Brain: Resolved areas of restricted diffusion. On sagittal T1 weighted imaging there are small areas of T1 hyperintensity along the areas of previously described infarct along the sylvian fissures, likely cortical laminar necrosis. No blood products are noted. Mild FLAIR hyperintensity in the regions of fourth ventricular and left inferior cerebellar disease previously. No signs of acute or interval infarct. No swelling or evidence of sulcal/intraventricular debris. No hydrocephalus or mass. Vascular: Preserved flow voids Skull and upper cervical spine: Negative Sinuses/Orbits: Fluid levels in bilateral maxillary and sphenoid sinuses and in the left more than right mastoids. IMPRESSION: 1. No acute or interval finding. Acute findings on previous exam are resolved. 2. Expected evolution of small scattered infarcts seen previously. 3. Bilateral paranasal sinus and mastoid fluid levels. Electronically Signed   By: Monte Fantasia M.D.   On: 12/26/2016 08:32   Dg Swallowing Func-speech Pathology  Result Date: 12/27/2016 Objective Swallowing Evaluation: Type of Study: MBS-Modified Barium Swallow Study Patient Details Name: QUAVION BOULE MRN: [id number] Date of Birth: December 10, 1965 Today's Date: 12/27/2016 Time: SLP Start Time (ACUTE ONLY): 0930-SLP Stop Time (ACUTE ONLY): 0950 SLP Time Calculation (min) (ACUTE ONLY): 20 min Past Medical History: Past Medical History: Diagnosis Date . COPD (chronic obstructive pulmonary disease) ([place])  . Genital warts  . HIV (human  immunodeficiency virus infection) ([place])  . Osteoporosis  Past Surgical History: Past Surgical History: Procedure Laterality Date . PROSTATE SURGERY  11/17/2016 HPI: 51 year old man with HIV/AIDS (first diagnosed 2003, COPD, and cocaine substance abuse, recent surgery in [place] (?) who presented to [facility] on 1/24 with complaint of altered mental status. Found to be hypertensive to 240/112. 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 No Data Recorded Assessment / Plan / Recommendation CHL IP CLINICAL IMPRESSIONS 12/27/2016 Clinical Impression MBS completed wearing Passy-Muir speaking valve. Pharyngeal impairments primarily originate from decreased sensation with minimal motor deficits. Valleculae was site of swallow initiation for most trials. Pyriform sinsus appear shallow and small with min-mild residue silently aspirated after the swallow. Larygneal penetration consistent during the swallow with honey thick barium without awareness. Recommend Dys 1 (puree) only and pudding thick liquids (nothing thinner than puree/pudding), donn speaking valve with all meals/meds, slow rate, small bites.   SLP Visit Diagnosis Dysphagia, pharyngeal phase (R13.13) Attention and concentration deficit following -- Frontal lobe and executive function deficit following -- Impact on safety and function Moderate aspiration risk   CHL IP TREATMENT RECOMMENDATION 12/27/2016 Treatment Recommendations Therapy as outlined in treatment plan below   Prognosis 12/27/2016 Prognosis for Safe Diet Advancement Good Barriers to Reach Goals -- Barriers/Prognosis Comment -- CHL IP DIET RECOMMENDATION 12/27/2016 SLP Diet Recommendations Dysphagia 1 (Puree) solids;Pudding thick liquid Liquid Administration via Spoon Medication Administration Crushed with puree Compensations Slow rate;Small sips/bites Postural Changes Seated upright at 90 degrees   CHL IP OTHER RECOMMENDATIONS 12/27/2016 Recommended Consults -- Oral Care Recommendations Oral care BID Other Recommendations Order thickener from pharmacy   CHL IP FOLLOW UP RECOMMENDATIONS 12/27/2016 Follow up Recommendations (No Data)   CHL IP FREQUENCY AND DURATION 12/27/2016 Speech Therapy Frequency (ACUTE ONLY) min 2x/week Treatment Duration 2 weeks      CHL IP ORAL PHASE 12/27/2016 Oral  Phase WFL Oral - Pudding Teaspoon -- Oral - Pudding Cup -- Oral - Honey Teaspoon -- Oral - Honey Cup -- Oral - Nectar Teaspoon -- Oral - Nectar Cup -- Oral - Nectar Straw -- Oral - Thin Teaspoon -- Oral - Thin Cup -- Oral - Thin Straw -- Oral - Puree -- Oral - Mech Soft -- Oral - Regular -- Oral - Multi-Consistency -- Oral - Pill -- Oral Phase - Comment --  CHL IP PHARYNGEAL PHASE 12/27/2016 Pharyngeal Phase Impaired Pharyngeal- Pudding Teaspoon -- Pharyngeal -- Pharyngeal- Pudding Cup -- Pharyngeal -- Pharyngeal- Honey Teaspoon Delayed swallow initiation-vallecula;Pharyngeal residue - valleculae;Pharyngeal residue - pyriform;Reduced tongue base retraction;Reduced laryngeal elevation;Penetration/Apiration after swallow Pharyngeal Material enters airway, passes BELOW cords without attempt by patient to eject out (silent aspiration);Material enters airway, remains ABOVE vocal cords and not ejected out Pharyngeal- Honey Cup -- Pharyngeal -- Pharyngeal- Nectar Teaspoon Delayed swallow initiation-vallecula;Penetration/Aspiration during swallow;Pharyngeal residue - valleculae;Pharyngeal residue - pyriform;Reduced tongue base retraction;Reduced laryngeal elevation Pharyngeal Material enters airway, passes BELOW cords without attempt by patient to eject out (silent aspiration) Pharyngeal- Nectar Cup -- Pharyngeal -- Pharyngeal- Nectar Straw -- Pharyngeal -- Pharyngeal- Thin Teaspoon -- Pharyngeal -- Pharyngeal- Thin Cup -- Pharyngeal -- Pharyngeal- Thin Straw -- Pharyngeal -- Pharyngeal- Puree Delayed swallow initiation-vallecula Pharyngeal -- Pharyngeal- Mechanical Soft -- Pharyngeal -- Pharyngeal- Regular -- Pharyngeal -- Pharyngeal- Multi-consistency -- Pharyngeal -- Pharyngeal- Pill -- Pharyngeal -- Pharyngeal Comment --  CHL IP CERVICAL ESOPHAGEAL PHASE 12/27/2016 Cervical Esophageal Phase WFL Pudding Teaspoon --  Pudding Cup -- Honey Teaspoon -- Honey Cup -- Nectar Teaspoon -- Nectar Cup -- Nectar Straw -- Thin Teaspoon  -- Thin Cup -- Thin Straw -- Puree -- Mechanical Soft -- Regular -- Multi-consistency -- Pill -- Cervical Esophageal Comment -- No flowsheet data found. Houston Siren 12/27/2016, 10:44 AM Orbie Pyo Colvin Caroli.Ed CCC-SLP Pager [phone]               Review of Systems  Constitutional: Negative.  Negative for chills and fever.  HENT: Negative.   Eyes: Negative.   Respiratory: Negative.   Cardiovascular: Negative.   Gastrointestinal: Negative.   Genitourinary: Negative.  Negative for dysuria and urgency.  Skin: Negative.   Neurological: Positive for sensory change, speech change and focal weakness. Negative for seizures and loss of consciousness.  Endo/Heme/Allergies: Negative.   Psychiatric/Behavioral: Negative.    Blood pressure (!) 140/96, pulse (!) 101, temperature 97.6 F (36.4 C), temperature source Oral, resp. rate 16, height 6' 4" (1.93 m), weight 82.9 kg (182 lb 12.8 oz), SpO2 98 %. Physical Exam  Constitutional:  Appears much older than stated age. Very thin, stigmata of chronic disease.   HENT:  Head: Normocephalic.  trach in place on room air. No PMV at this time.   Eyes: Pupils are equal, round, and reactive to light.  Cardiovascular: Normal rate.   Respiratory: Effort normal.  GI: Soft.  Genitourinary:  Genitourinary Comments: Some residual glans / distal shaft condyloma that is non-obstructing, non-purlulent. approx 20 Sq cm anterior / inferior scrotal woudn with pink granulation tissue throughout. Testes palpably normal.   Musculoskeletal:  LE paralysis  Neurological: He is alert.  Skin: Skin is warm.  Psychiatric: He has a normal mood and affect.     ,  12/27/2016, 3:04 PM

## 2016-12-28 DIAGNOSIS — F1910 Other psychoactive substance abuse, uncomplicated: Secondary | ICD-10-CM

## 2016-12-28 DIAGNOSIS — A523 Neurosyphilis, unspecified: Secondary | ICD-10-CM

## 2016-12-28 LAB — GLUCOSE, CAPILLARY
GLUCOSE-CAPILLARY: 169 mg/dL — AB (ref 65–99)
GLUCOSE-CAPILLARY: 176 mg/dL — AB (ref 65–99)
GLUCOSE-CAPILLARY: 276 mg/dL — AB (ref 65–99)
GLUCOSE-CAPILLARY: 239 mg/dL — AB (ref 65–99)
Glucose-Capillary: 276 mg/dL — ABNORMAL HIGH (ref 65–99)
Glucose-Capillary: 347 mg/dL — ABNORMAL HIGH (ref 65–99)

## 2016-12-28 MED ORDER — FENTANYL 12 MCG/HR TD PT72
25.0000 ug | MEDICATED_PATCH | TRANSDERMAL | Status: DC
  Administered 2016-12-29: 25 ug via TRANSDERMAL
  Filled 2016-12-28: qty 2

## 2016-12-28 MED ORDER — FUROSEMIDE 10 MG/ML IJ SOLN
INTRAMUSCULAR | Status: AC
  Filled 2016-12-28: qty 4

## 2016-12-28 MED ORDER — GABAPENTIN 300 MG PO CAPS
600.0000 mg | ORAL_CAPSULE | Freq: Three times a day (TID) | ORAL | Status: DC
  Administered 2016-12-28 – 2017-01-01 (×12): 600 mg via ORAL
  Filled 2016-12-28 (×12): qty 2

## 2016-12-28 MED ORDER — INSULIN GLARGINE 100 UNIT/ML ~~LOC~~ SOLN
6.0000 [IU] | Freq: Once | SUBCUTANEOUS | Status: AC
  Administered 2016-12-28: 6 [IU] via SUBCUTANEOUS
  Filled 2016-12-28: qty 0.06

## 2016-12-28 MED ORDER — OXYCODONE HCL 5 MG PO TABS
5.0000 mg | ORAL_TABLET | ORAL | Status: DC | PRN
  Administered 2016-12-29 – 2016-12-31 (×5): 5 mg via ORAL
  Filled 2016-12-28 (×7): qty 1

## 2016-12-28 MED ORDER — CLONAZEPAM 1 MG PO TABS
1.0000 mg | ORAL_TABLET | Freq: Three times a day (TID) | ORAL | Status: DC
  Administered 2016-12-28 – 2017-01-01 (×12): 1 mg via ORAL
  Filled 2016-12-28 (×12): qty 1

## 2016-12-28 MED ORDER — PANTOPRAZOLE SODIUM 40 MG PO PACK
40.0000 mg | PACK | Freq: Every day | ORAL | Status: DC
  Administered 2016-12-28 – 2017-01-04 (×8): 40 mg via ORAL
  Filled 2016-12-28 (×6): qty 20

## 2016-12-28 MED ORDER — INSULIN GLARGINE 100 UNIT/ML ~~LOC~~ SOLN
15.0000 [IU] | Freq: Two times a day (BID) | SUBCUTANEOUS | Status: DC
  Administered 2016-12-28 – 2016-12-29 (×3): 15 [IU] via SUBCUTANEOUS
  Filled 2016-12-28 (×4): qty 0.15

## 2016-12-28 NOTE — Progress Notes (Addendum)
Nutrition Follow-up  DOCUMENTATION CODES:   Not applicable  INTERVENTION:   -D/c Prostat BID -Snacks TID between meals  NUTRITION DIAGNOSIS:   Increased nutrient needs related to chronic illness as evidenced by estimated needs.  Ongoing  GOAL:   Patient will meet greater than or equal to 90% of their needs  Progressing  MONITOR:   PO intake, Diet advancement, Labs, Weight trends, Skin, I & O's  REASON FOR ASSESSMENT:   Consult Assessment of nutrition requirement/status  ASSESSMENT:   51 year old man with HIV/AIDS (first diagnosed 2003, last CD4 count 245 with viremia of 1,160 in October 2017; on Genovya, Darunavir and Bactrim), Genital Warts, COPD, and cocaine substance abuse who presented to [facility] on 1/24 with altered mental status. Intubated for airway protection and [facility]  2/14- transferred from ICU to medical floor 2/15- NGT pulled and replaced 2/16- s/p BSE and SLP recommended NPO 2/19- s/p MBSS- advanced to dysphagia 1 diet with pudding thick liquids; TF held  Per urology note on 12/27/16, scrotal wound looks good; no signs of infection or necrosis.   Pt seen per request on MD and nursing staff. All report that pt is requesting excessive amounts of food. Per discussion with RN, pt has been ordered double portions for lunch due to complaints of excessive hunger. SLP evaluated this morning; per RN, plan is to continue current diet order with potential plan for objective testing prior to discharge.   Spoke with pt at bedside, who is requesting a cheeseburger and french fries from [employer]. Attempted to re-direct pt regarding rationale for diet order, however, pt reports "they just changed it", which is contradictory from conversation from RN. Pt reports he has a large appetite. Discussed importance of good meal intake to promote healing. Agree with order for double portions, due to diet restrictions and increased nutrient needs due to  medical complexity (wounds, HIV). Given restrictive nature of diet, it will be difficult for pt to meet nutritional needs. RD will also add high protein snacks between meals (consisting of pureed fruit, Magic cup, yogurt, and pudding) in attempt to help to meet nutritional goals.   Pt voiced to this RD that he is very eager to go home. Per CSW notes, pt is difficult to place, but requiring SNF placement.   Labs reviewed: CBGS: 169-347.   Diet Order:  DIET - DYS 1 Room service appropriate? Yes; Fluid consistency: Pudding Thick  Skin:  Wound (see comment) (non pressure scrotal wound)  Last BM:  12/26/16  Height:   Ht Readings from Last 1 Encounters:  12/01/16 6\' 4"  (1.93 m)    Weight:   Wt Readings from Last 1 Encounters:  12/28/16 199 lb 6.4 oz (90.4 kg)    Ideal Body Weight:  91.8 kg  BMI:  Body mass index is 24.27 kg/m.  Estimated Nutritional Needs:   Kcal:  2300-2500  Protein:  115-135 gm  Fluid:  2.5 L  EDUCATION NEEDS:   Education needs addressed  Leilany Digeronimo A. Mayford KnifeWilliams, RD, LDN, CDE Pager: [phone] After hours Pager: [phone]

## 2016-12-28 NOTE — Progress Notes (Signed)
Pt discussed in [facility]OS meeting this am- patient will need DTP SNF bed due to multiple acute medical issues and expensive antiretroviral medications  CSW discussed with assistance director and patient being faxed to DTP facilities  Burna SisJenna H. Malinda Mayden, LCSW Clinical Social Worker [phone]

## 2016-12-28 NOTE — Procedures (Signed)
Tracheostomy Change Note  Patient Details:   Name: Lawrence Kane DOB: 1966/05/28 MRN: [id number]    Airway Documentation:     Evaluation  O2 sats: stable throughout Complications: No apparent complications Patient did tolerate procedure well. Bilateral Breath Sounds: Clear, Diminished   RT x 2 completed trach downsize and capped per NP instruction.  Positive color change noted in CO2 detector. Bilateral breath sounds.  Pt tolerating well at this time.  RT will continue to monitor.    Forest BeckerJean S Aamari Strawderman 12/28/2016, 2:19 PM

## 2016-12-28 NOTE — Progress Notes (Signed)
  Speech Language Pathology Treatment: Dysphagia  Patient Details Name: Lawrence Kane MRN: [id number] DOB: [birth year] Today's Date: 12/28/2016 Time: 1010-1029 SLP Time Calculation (min) (ACUTE ONLY): 19 min  Assessment / Plan / Recommendation Clinical Impression  Mr. Lawrence Kane was confused and slightly agitated (banging on rails) requiring maximal cueing to complete tasks and to safely consume POs. PMSV was in place during observation of POs. He requested water and ice; SLP reiterated and educated the importance and clinical reasoning of his current diet (Dys 1 solids and pudding thick liquids) due to his high aspiration risk, which the prior MBS revealed. Observed pt with pureed solids and pudding thick liquids, with no s/s of airway compromise initially, however delayed coughing was noted, possibly due to larger spoonfuls. Reassured pt that the current diet is temporary and a repeat instrumental swallow evaluation (MBS) will be completed at a later date to assess swallow functioning, improvement, and possible diet upgrade. Recommend continuing current diet of Dys 1 solids, pudding thick liquids, meds crushed. Emphasized importance of slow rate and small bites/sips. ST will continue to follow up for treatment.   HPI HPI: 51 year old man with HIV/AIDS (first diagnosed 2003, COPD, and cocaine substance abuse, recent [place] in [place] (?) who presented to [facility] on 1/24 with complaint of altered mental status. Found to be hypertensive to 240/112. 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      SLP Plan  Continue with current plan of care;MBS (possible repeat MBS (?))       Recommendations  Diet recommendations: Pudding-thick liquid;Dysphagia 1 (puree) Liquids provided via: Teaspoon Medication Administration: Crushed with puree Supervision: Staff  to assist with self feeding;Full supervision/cueing for compensatory strategies Compensations: Slow rate;Small sips/bites Postural Changes and/or Swallow Maneuvers: Seated upright 90 degrees      Patient may use Passy-Muir Speech Valve: During PO intake/meals;During all waking hours (remove during sleep) PMSV Supervision: Intermittent         Oral Care Recommendations: Oral care QID Follow up Recommendations: Skilled Nursing facility SLP Visit Diagnosis: Dysphagia, pharyngeal phase (R13.13) Plan: Continue with current plan of care;MBS (possible repeat MBS (?))       GO                Lawrence Kane , Student-SLP 12/28/2016, 11:26 AM

## 2016-12-28 NOTE — Progress Notes (Signed)
PULMONARY / CRITICAL CARE MEDICINE   Name: Lawrence Kane MRN: [id number] DOB: 09-24-1966    ADMISSION DATE:  12/01/2016 CONSULTATION DATE:  12/01/2016  REFERRING MD:  [name] transfer  CHIEF COMPLAINT:  F/u trach   SUBJECTIVE:  Wants to eat.  Wants the trach out Thinks staff is "making fun of him" Objective BP (!) 128/92 (BP Location: Right Arm)   Pulse (!) 58   Temp 97.9 F (36.6 C)   Resp 18   Ht _0  (1.93 m)   Wt 199 lb 6.4 oz (90.4 kg)   SpO2 97%   BMI 24.27 kg/m   Intake/Output Summary (Last 24 hours) at 12/28/16 1257 Last data filed at 12/28/16 0601  Gross per 24 hour  Intake             2980 ml  Output             1700 ml  Net             1280 ml   General appearance:  51 Year old  Male confused,  conversant  Eyes: anicteric sclerae icteric , moist conjunctivae; PERRL, EOMI bilaterally. Mouth:  membranes and no mucosal ulcerations; normal hard and soft palate Neck: Trachea midline; neck supple, no JVD, # 8 trach. Weak phonation  Lungs/chest: CTA, with normal respiratory effort and no intercostal retractions CV: RRR, no MRGs  Abdomen: Soft, non-tender; no masses or HSM Neuro/Psych: paranoid affect, alert and oriented to person & place. Has generalized weakness.  CBC Recent Labs     12/27/16  0511  WBC  7.6  HGB  12.1*  HCT  38.0*  PLT  289    Coag's No results for input(s): APTT, INR in the last 72 hours.  BMET Recent Labs     12/26/16  0622  12/27/16  0511  NA  137  136  K  3.2*  4.0  CL  106  103  CO2  22  24  BUN  22*  15  CREATININE  0.60*  0.61  GLUCOSE  80  153*    Electrolytes Recent Labs     12/26/16  0622  12/27/16  0511  CALCIUM  8.5*  8.3*    Sepsis Markers No results for input(s): PROCALCITON, O2SATVEN in the last 72 hours.  Invalid input(s): LACTICACIDVEN  ABG No results for input(s): PHART, PCO2ART, PO2ART in the last 72 hours.  Liver Enzymes Recent Labs     12/26/16  0622  AST  55*  ALT  83*  ALKPHOS   62  BILITOT  1.1  ALBUMIN  2.0*    Cardiac Enzymes Recent Labs     12/26/16  0622  12/26/16  1804  TROPONINI  <0.03  <0.03    Glucose Recent Labs     12/27/16  1628  12/27/16  2029  12/28/16  0008  12/28/16  0435  12/28/16  0812  12/28/16  1157  GLUCAP  192*  222*  347*  169*  176*  276*    Imaging Dg Swallowing Func-speech Pathology  Result Date: 12/27/2016 Objective Swallowing Evaluation: Type of Study: MBS-Modified Barium Swallow Study Patient Details Name: Lawrence Kane MRN: [id number] Date of Birth: Mar 16, 1966 Today's Date: 12/27/2016 Time: SLP Start Time (ACUTE ONLY): 0930-SLP Stop Time (ACUTE ONLY): 0950 SLP Time Calculation (min) (ACUTE ONLY): 20 min Past Medical History: Past Medical History: Diagnosis Date . COPD (chronic obstructive pulmonary disease) ([place])  . Genital warts  . HIV (human immunodeficiency  virus infection) ([place])  . Osteoporosis  Past Surgical History: Past Surgical History: Procedure Laterality Date . PROSTATE SURGERY  11/17/2016 HPI: 51 year old man with HIV/AIDS (first diagnosed 2003, COPD, and cocaine substance abuse, recent surgery in [place] (?) who presented to [facility] on 1/24 with complaint of altered mental status. Found to be hypertensive to 240/112. 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 No Data Recorded Assessment / Plan / Recommendation CHL IP CLINICAL IMPRESSIONS 12/27/2016 Clinical Impression MBS completed wearing Passy-Muir speaking valve. Pharyngeal impairments primarily originate from decreased sensation with minimal motor deficits. Valleculae was site of swallow initiation for most trials. Pyriform sinsus appear shallow and small with min-mild residue silently aspirated after the swallow. Larygneal penetration consistent during the swallow with honey thick barium without awareness. Recommend  Dys 1 (puree) only and pudding thick liquids (nothing thinner than puree/pudding), donn speaking valve with all meals/meds, slow rate, small bites.   SLP Visit Diagnosis Dysphagia, pharyngeal phase (R13.13) Attention and concentration deficit following -- Frontal lobe and executive function deficit following -- Impact on safety and function Moderate aspiration risk   CHL IP TREATMENT RECOMMENDATION 12/27/2016 Treatment Recommendations Therapy as outlined in treatment plan below   Prognosis 12/27/2016 Prognosis for Safe Diet Advancement Good Barriers to Reach Goals -- Barriers/Prognosis Comment -- CHL IP DIET RECOMMENDATION 12/27/2016 SLP Diet Recommendations Dysphagia 1 (Puree) solids;Pudding thick liquid Liquid Administration via Spoon Medication Administration Crushed with puree Compensations Slow rate;Small sips/bites Postural Changes Seated upright at 90 degrees   CHL IP OTHER RECOMMENDATIONS 12/27/2016 Recommended Consults -- Oral Care Recommendations Oral care BID Other Recommendations Order thickener from pharmacy   CHL IP FOLLOW UP RECOMMENDATIONS 12/27/2016 Follow up Recommendations (No Data)   CHL IP FREQUENCY AND DURATION 12/27/2016 Speech Therapy Frequency (ACUTE ONLY) min 2x/week Treatment Duration 2 weeks      CHL IP ORAL PHASE 12/27/2016 Oral Phase WFL Oral - Pudding Teaspoon -- Oral - Pudding Cup -- Oral - Honey Teaspoon -- Oral - Honey Cup -- Oral - Nectar Teaspoon -- Oral - Nectar Cup -- Oral - Nectar Straw -- Oral - Thin Teaspoon -- Oral - Thin Cup -- Oral - Thin Straw -- Oral - Puree -- Oral - Mech Soft -- Oral - Regular -- Oral - Multi-Consistency -- Oral - Pill -- Oral Phase - Comment --  CHL IP PHARYNGEAL PHASE 12/27/2016 Pharyngeal Phase Impaired Pharyngeal- Pudding Teaspoon -- Pharyngeal -- Pharyngeal- Pudding Cup -- Pharyngeal -- Pharyngeal- Honey Teaspoon Delayed swallow initiation-vallecula;Pharyngeal residue - valleculae;Pharyngeal residue - pyriform;Reduced tongue base retraction;Reduced  laryngeal elevation;Penetration/Apiration after swallow Pharyngeal Material enters airway, passes BELOW cords without attempt by patient to eject out (silent aspiration);Material enters airway, remains ABOVE vocal cords and not ejected out Pharyngeal- Honey Cup -- Pharyngeal -- Pharyngeal- Nectar Teaspoon Delayed swallow initiation-vallecula;Penetration/Aspiration during swallow;Pharyngeal residue - valleculae;Pharyngeal residue - pyriform;Reduced tongue base retraction;Reduced laryngeal elevation Pharyngeal Material enters airway, passes BELOW cords without attempt by patient to eject out (silent aspiration) Pharyngeal- Nectar Cup -- Pharyngeal -- Pharyngeal- Nectar Straw -- Pharyngeal -- Pharyngeal- Thin Teaspoon -- Pharyngeal -- Pharyngeal- Thin Cup -- Pharyngeal -- Pharyngeal- Thin Straw -- Pharyngeal -- Pharyngeal- Puree Delayed swallow initiation-vallecula Pharyngeal -- Pharyngeal- Mechanical Soft -- Pharyngeal -- Pharyngeal- Regular -- Pharyngeal -- Pharyngeal- Multi-consistency -- Pharyngeal -- Pharyngeal- Pill -- Pharyngeal -- Pharyngeal Comment --  CHL IP CERVICAL ESOPHAGEAL PHASE 12/27/2016 Cervical Esophageal Phase WFL Pudding Teaspoon -- Pudding  Cup -- Honey Teaspoon -- Honey Cup -- Nectar Teaspoon -- Nectar Cup -- Nectar Straw -- Thin Teaspoon -- Thin Cup -- Thin Straw -- Puree -- Mechanical Soft -- Regular -- Multi-consistency -- Pill -- Cervical Esophageal Comment -- No flowsheet data found. [place] Siren 12/27/2016, 10:44 AM Orbie Pyo Colvin Caroli.Ed CCC-SLP Pager [phone]                STUDIES:  1/24 CXR >> ATX vs LLL infiltrate 1/25 CT head >> neg for acute intracranial process 1/25 LP cytology >> no malignant cells 1/25 LP >> yellow, cloudy, glucose 20, total protein >600, RBC 114>49, WBC 970, 36% neut, 20% lymphs, 19% eos 1/28 BAL cell count >> 266 WBC, eos 1 1/28 BAL cytology >> no malignant cells 1/27 BAL for wet mount> neg for parasites 1/27 MRI brain >> small acute infarcts  in cerebellum and cerebral cortex, sucal signal 1/29 LP >> Glucose 82, protein >600, WBC 600>885, eosinophils 28% 1/29 Echo >> LV EF 60-65%, indeterminent diastolic function, wall motion normal 1/30 EEG >> diffuse slowing and background suppression, no seizures 2/3 MRI thoracic/lumbar/cervical >> (-) abscess. Meningitis. R/O CMV 2/8 abd/pevlis CT >> borderline to mildly enlarged loops of small bowel throughout the abd with dilated fluid filled R colon and transverses colon, findings are suggestive of an ileus. Partial bowel obstruction included in the differential, but felt less likely. Partial consolidation in the LLL concerning for PNA.  2/20 PCCM asked to see again re: trach down-sizing etc   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 1/24 Cryptococcal [name]): negative 1/24 CSF [facility]): NGTD 1/22 scrotal wound cx [name]): MRSA Toxoplasma [name]): Negative 1/25 BCx x2 : NGTD 1/25 HCV ab + 1/24 Trach asp 1/24: NGTD 1/24 MRSA PCR: MRSA pos 1/25 HIV quant: 80, CD4 10 1/25 CSF AFB: Smear neg, >> 1/25 CSF fungal cx: NGTD 1/25 CSF crypto ag: neg 1/25 CSF gram stain no organisms, cx: NGTD 1/27 strongyloides ab >> negative 1/27 coccidiomycosis ab >> 1/28 BAL AFB smear/cx> 1/28 BAL Fungus cx >> 1/28 BAL gram stain/Cx: 40K ESBL Klebsiella pneumoniae 1/28 BAL pneumocystis smear: Neg 1/29 CSF Cx >> no organisms on gram stain, >>  2/04 CSF CMV >> not done?  ANTIBIOTICS: Rocephin 1/24>>1/30 Vancomycin 1/24>>1/30; 2/2 > 2/05 Acyclovir 1/24>> 1/26, 1/27>> Ampicillin 1/24>> 1/27 Bactrim M/W/F for PCP prophylaxis Meropenem 1/30>> 2/1; 2/2 > 2/6 Pen G 2/1 >> 2/2 Cipro 2/1 >> 2/2  SIGNIFICANT EVENTS: 1/24  Transferred from [facility] 1/25  Underwent LP here 2/05  Trach 2/06  To ATC 2/08  abd distention, likely ileus 2/12  Abd improved, tol TF, weaning on ATC  LINES/TUBES: L IJ CVL 1/24 >> ETT 1/24 >> 2/5 Foley 1/24 >> OGT 1/24 >>  2/5 Trach (df) 2/5 >> NGT to suction 2/08  Impression/plan  Tracheostomy dependent following prolonged critical illness Klebsiella PNA HCAP (ESBL producer) Dysarthria Dysphagia Deconditioning 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 Intubated for airway protection. Prolonged vent wean due to ESBL klebsiella HCAP with sepsis > trach 2/5.   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 He is being  treated for ESBL klebsiella PNA (w/ stop date planned for 2/22). Clinically he is improving. He is on room air, eating and using PMV. He still has significant dysphagia & severe deconditioning.   Tracheostomy dependent following prolonged critical illness.  Plan Down size to # 6 cuffless.  Will initiate capping trials; ideally I would like him more mobile or at least show me stronger phonation prior to decannulation.  Will see him again tomorrow after down-sizing and capping trial to make further recs.   Erick Colace ACNP-BC [place] Pager # [phone] OR # [phone] if no answer  STAFF NOTE: I, Merrie Roof, MD FACP have personally reviewed patient's available data, including medical history, events of note, physical examination and test results as part of my evaluation. I have discussed with resident/NP and other care providers such as pharmacist, RN and RRT. In addition, I personally evaluated patient and elicited key findings of: awake, alert, lungs coarse LLL, last pcxr residual LLL atx / infiltrate, has PMV on with extremely weak voice, need to keep trach in and push for all  day PMV strength, no decannulation planned based on this for now until we see progress, 6 may help, does not need cuff  Lavon Paganini. Titus Mould, MD, [place] Pgr: [facility] 12/28/2016 1:49 PM

## 2016-12-28 NOTE — Progress Notes (Signed)
[facility] PROGRESS NOTE  Lawrence Kane YOV:[id number] DOB: [birth year] DOA: 12/01/2016  PCP: No primary care provider on file.  Brief History/Interval Summary:Lawrence Kane is a 51 y.o. male with history of COPD, HIV, substance abuse, noncompliance, recent surgery in [place];  who was admitted via [facility] on 12/01/16 with AMS changes and septic shock due to bacterial meningitis. He was intubated for airway protection and started on broad spectrum antibiotics as well as acyclovir for scrotal lesions. LP with high protein, low glucose and high WBC with predominance of eosinophils. ID consulted and questioned parasitic infection. Repeat LP 1/29 showed elevated glucose, elevated WBC with 28% eosinophils. BAL cytology/wet mount negative for malignant cells or parasites. MRI brain done 1/27 due to ongoing decrease in level of responsiveness, decrease in movement of BUE and flaccid BLE. MRI brain reviewed, showing small infarcts in cerebellum and cortex.  ?due to vasospasms or central/embolic disease and generalized sinuitis/mastoidits. Neurology questioned HSV vasculopathy and recommended MRA brain for work up. MRA brain revealed irregular R-ICA no large vessel stenosis and beaded appearance of right cervical ICA compatible with fibromuscular dysplasia.  Infarcts felt to be due to vasculitis/vasospams secondary to meningoencephalitis. MRI total spine was negative for abscess or hematoma, generalized meningitis and radiculitis and multifocal dorsal thoracic cord signal abnormality from myelitis. 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   He has had loose stools due to partial SBO v/s ileus. Agitation improving. He has completed treatment for  ESBL K-pneumoniae in BAL.   He was unable to tolerate vent wean requiring tracheostomy 2/5. He has been weaned to ATC for past 2 days but continues to have  copious purulent secretions via trach as well as restraints due to poor safety. Therapy ongoing and CIR recommended for follow up therapy.     . Subsequently, patient developed an ileus. NG tube was placed to intermittent suction.  Patient has pulled out his NG tube multiple times this admission. Still unable to swallow. Speech therapy consultation is pending     Assessment/Plan: Acute Hypoxemic Respiratory Failure , Tracheostomy 2/5 This was secondary to HCAP ESBL Klebsiella LLL+ sepsis + Unable to protect airway + pulm edema. Patient was on the ventilator for a prolonged duration. Subsequently underwent tracheostomy placement on 2/5. Pulmonology is following. Patient is off of the Precedex infusion.   Was on trach collar all day yesterday and tolerated it well. Starting to have mucopurulent secretions, patient received multiple courses of antibiotic between 1/24-2/7. Chest x-ray 2/15 showed worsening bronchopneumonia.  patient restarted on meropenem and vancomycin 2/15, will continue through 2/22. Discussed with PCCM , , patient will continue with cuffed tracheostomy tube in case he needs to go back on the ventilator Proceed with establishing PO intake without replacing NG tube  Speech therapy recommendations - Dysphagia 1 (Puree) solids;Pudding thick liquid     Hypoglycemia/hyperglycemia Increase Lantus to 15 units twice a day, as patient has now established by mouth intake  Dysarthria,  ruled out for acute CVA, MRI of the brain   was negative     Scrotal wound/history of genital warts Left scrotal area wound is deeper with odor and the head of the penis has wart like growths that area much worse than at the time of admission Started on fluconazole IV Periwound: various wart like lesion, patient with history of genital warts known at the time of admission  May need urology consult Awaiting records from outside facility, details of what kind of surgery he had Appreciate urology input  s/p  local excision of scrotal condyloma by Urologist in [place] local wound care  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 HSV positive on CSF from [facility]. Infectious disease has been following. Treated with acyclovir.  for total of 21 days. Completed treatment on 2/17.  Continue Bactrim for PCP prophylaxis.  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 Patient is on acyclovir. High-dose steroids 1000 mg/day , given for 5 days. Now on low-dose steroid. This will need to be slowly tapered down. Neurology has seen the patient. On fentanyl patch for pain issues. Started during this hospitalization.Re-image spine in 2-3 weeks to assess for improvement. Neurology signed off 2/12.  Ileus Improved. Patient had NG tube placed 2/8. Patient has removed his NG several times.     DM, Hyperglycemia  HbA1c 6.6 on 1/26. Continue to adjust insulin based on by mouth intake     Bradycardia Heart rate has improved. Bradycardia was most likely due to medication such as versed. TSH is normal. Continue to monitor.   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 On aspirin. Echocardiogram shows normal systolic function. LDL 93. PT and OT evaluation. No significant carotid stenosis noted on MRA neck.  he was seen by neurology. They had mentioned that a TEE could be considered once the patient was more stable. This can be pursued at a later date, although embolic stroke appeared to be less of a possibility based on his clinical scenario. Repeat MRI negative for acute CVA  Pulmonary edema Seems to have improved. Continue intermittent IV Lasix,    Moderate to Severe Protein Calorie Malnutrition  We  l resumed oral feeding  on 2/19    Hypernatremia Free water to continue. Increase to every 6 hours. Repeat labs tomorrow.  Tube feedings on hold. Sodium level should come down.  Transaminitis Worsened by seroquel likely which was discontinued on 1/31. Improved.   HCV ab+, Chronic hep b Management will be pursued as outpatient.  Normocytic anemia Hemoglobin stable. No evidence for overt bleeding. Continue to monitor.  LLL PNA with ESBL Klebsiella  Restarted antibiotics 2/15  HIV On antiretroviral treatment. Infectious disease was following.  Metabolic Encephalopathy Continue to monitor. Seems to be stable. PRN Sedative agents for agitation. Restraints in place to prevent him from pulling out supportive tubes.  History of Cocaine Abuse Will need counseling when able to communicate effectively  Disposition-bedside meeting with palliative care, mother/ sister  on 2/19 completed   Consultants: Infectious disease. Critical care medicine. Neurology.  STUDIES:  1/24 CXR >> ATX vs LLL infiltrate 1/25 CT head >> neg for acute intracranial process 1/25 LP cytology >> no malignant cells 1/25 LP >> yellow, cloudy, glucose 20, total protein >600, RBC 114>49, WBC 970, 36% neutrophil, 20% lymphs, 19% eos 1/28 BAL cell count >> 266 WBC, eos 1 1/28 BAL cytology >> no malignant cells 1/27 BAL for wet mount> neg for parasites 1/27 MRI brain >> small acute infarcts in cerebellum and cerebral cortex, sucal signal 1/29 LP >> Glucose 82, protein >600, WBC 600>885, eosinophils 28% 1/29 Echo >> LV EF 60-65%, indeterminent diastolic function, wall motion normal 1/30 EEG >> diffuse slowing and background suppression, no seizures 2/3 MRI thoracic/lumbar/cervical >> (-) abscess. Meningitis. R/O  CMV   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 1/24 Cryptococcal [name]): negative 1/24 CSF [facility]): NGTD 1/22 scrotal wound cx [name]): MRSA Toxoplasma [name]): Negative 1/25BCx x2 : NGTD 1/25 HCV ab + 1/24 Trach asp 1/24: NGTD 1/24MRSA PCR: MRSA pos 1/25 HIV  quant: 80, CD4 10 1/25 CSF AFB: Smear neg, >> 1/25 CSF fungal cx: NGTD 1/25 CSF crypto ag: neg 1/25 CSF gram stain no organisms, cx: NGTD 1/27 strongyloides ab >> negative 1/27 coccidiomycosis ab >> 1/28 BAL AFB smear/cx> 1/28 BAL Fungus cx >> 1/28 BAL gram stain/Cx: 40K ESBL Klebsiella pneumoniae 1/28 BAL pneumocystis smear: Neg 1/27 O&P >> 1/29 CSF Cx >> no organisms on gram stain, >> NGTD 2/04 CSF CMV >> not done?   ANTIBIOTICS: Rocephin 1/24>>1/30 Vancomycin 1/24>>1/30; 2/2 >  Acyclovir 1/24>> 1/26, 1/27>> Ampicillin 1/24>> 1/27 Bactrim M/W/F for PCP prophylaxis Meropenem 1/30>> 2/1; 2/2 >  Pen G 2/1 >> 2/2 Cipro 2/1 >> 2/2 Meropenem/vancomycin-2/15-  SIGNIFICANT EVENTS: 1/24  Transferred from [facility] 1/25  Underwent LP here 2/05  Trach 2/06  To ATC 2/8  patient developed ileus  LINES/TUBES: L IJ CVL 1/24 >> ETT 1/24 >> 2/5 Foley 1/24 >> OGT 1/24 >> 2/5 Trach (df) 2/5 >>   Subjective/Interval H istory: In NSR, no afib overnight  Very hungry , asking to eat a lot of food  ROS: Unable to do.  Objective:  Vital Signs  Vitals:   12/27/16 2032 12/28/16 0200 12/28/16 0440 12/28/16 0443  BP: 132/72 139/84 (!) 128/92   Pulse: 69 66 69   Resp: 18 (!) 24 18   Temp: 98.2 F (36.8 C)  97.9 F (36.6 C)   TempSrc: Oral     SpO2: 96% 100% 97%   Weight:    90.4 kg (199 lb 6.4 oz)  Height:        Intake/Output Summary (Last 24 hours) at 12/28/16 0958 Last data filed at 12/28/16 0601  Gross per 24 hour  Intake             2980 ml  Output             1700 ml  Net             1280 ml   Filed Weights   12/26/16 0520 12/27/16 0500 12/28/16 0443  Weight: 88.3 kg (194 lb 10.7 oz) 82.9 kg (182 lb 12.8 oz) 90.4 kg (199 lb 6.4 oz)    General appearance: Patient is awake and alert. Neck: Tracheostomy noted Resp: Good air entry bilaterally. No wheezing, rales or rhonchi. diminished at the bases.  Cardio: S1, S2, is bradycardic, regular. No S3, S4. No rubs,  murmurs, or bruit. Minimal pedal edema.  GI: Abdomen remains soft. Not distended. Bowel sounds are present. No masses or organomegaly. Nontender.  Extremities: Minimal edema noted bilateral lower extremities Pulses: 2+ and symmetric Neurologic: Patient is awake and alert. Moving his upper extremities. No movement noted in the lower extremities. : Large open wound on left lateral portion of scrotum with purulent discharge. Foley in place.   Lab Results:  Data Reviewed: I have personally reviewed following labs and imaging studies  CBC:  Recent Labs Lab 12/22/16 0730 12/23/16 0447 12/24/16 0402 12/27/16 0511  WBC 11.2* 9.5 9.4 7.6  HGB 10.4* 10.7* 11.0* 12.1*  HCT 34.2* 35.3* 34.9* 38.0*  MCV 98.0 98.6 95.9 94.8  PLT 384 384 362 545    Basic Metabolic Panel:  Recent Labs Lab  12/22/16 0730 12/23/16 0447 12/25/16 0605 12/26/16 0622 12/27/16 0511  NA 150* 144 139 137 136  K 4.6 4.5 3.7 3.2* 4.0  CL 116* 115* 107 106 103  CO2 _0 GLUCOSE 201* 277* 119* 80 153*  BUN 36* 37* 23* 22* 15  CREATININE 0.78 0.87 0.57* 0.60* 0.61  CALCIUM 9.3 9.1 8.7* 8.5* 8.3*    GFR: Estimated Creatinine Clearance: 134.1 mL/min (by C-G formula based on SCr of 0.61 mg/dL).  Liver Function Tests:  Recent Labs Lab 12/23/16 0447 12/26/16 0622  AST 54* 55*  ALT 90* 83*  ALKPHOS 61 62  BILITOT 0.6 1.1  PROT 7.3 6.9  ALBUMIN 2.1* 2.0*     Coagulation Profile: No results for input(s): INR, PROTIME in the last 168 hours.  CBG:  Recent Labs Lab 12/27/16 1628 12/27/16 2029 12/28/16 0008 12/28/16 0435 12/28/16 0812  GLUCAP 192* 222* 347* 169* 176*     No results found for this or any previous visit (from the past 240 hour(s)).    Radiology Studies: Dg Swallowing Func-speech Pathology  Result Date: 12/27/2016 Objective Swallowing Evaluation: Type of Study: MBS-Modified Barium Swallow Study Patient Details Name: Lawrence Kane MRN: [id number] Date of Birth:  1966/10/22 Today's Date: 12/27/2016 Time: SLP Start Time (ACUTE ONLY): 0930-SLP Stop Time (ACUTE ONLY): 0950 SLP Time Calculation (min) (ACUTE ONLY): 20 min Past Medical History: Past Medical History: Diagnosis Date . COPD (chronic obstructive pulmonary disease) ([place])  . Genital warts  . HIV (human immunodeficiency virus infection) ([place])  . Osteoporosis  Past Surgical History: Past Surgical History: Procedure Laterality Date . PROSTATE SURGERY  11/17/2016 HPI: 51 year old man with HIV/AIDS (first diagnosed 2003, COPD, and cocaine substance abuse, recent surgery in [place] (?) who presented to [facility] on 1/24 with complaint of altered mental status. Found to be hypertensive to 240/112. 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 No Data Recorded Assessment / Plan / Recommendation CHL IP CLINICAL IMPRESSIONS 12/27/2016 Clinical Impression MBS completed wearing Passy-Muir speaking valve. Pharyngeal impairments primarily originate from decreased sensation with minimal motor deficits. Valleculae was site of swallow initiation for most trials. Pyriform sinsus appear shallow and small with min-mild residue silently aspirated after the swallow. Larygneal penetration consistent during the swallow with honey thick barium without awareness. Recommend Dys 1 (puree) only and pudding thick liquids (nothing thinner than puree/pudding), donn speaking valve with all meals/meds, slow rate, small bites.   SLP Visit Diagnosis Dysphagia, pharyngeal phase (R13.13) Attention and concentration deficit following -- Frontal lobe and executive function deficit following -- Impact on safety and function Moderate aspiration risk   CHL IP TREATMENT RECOMMENDATION 12/27/2016 Treatment Recommendations Therapy as outlined in treatment plan below   Prognosis 12/27/2016 Prognosis for Safe Diet Advancement  Good Barriers to Reach Goals -- Barriers/Prognosis Comment -- CHL IP DIET RECOMMENDATION 12/27/2016 SLP Diet Recommendations Dysphagia 1 (Puree) solids;Pudding thick liquid Liquid Administration via Spoon Medication Administration Crushed with puree Compensations Slow rate;Small sips/bites Postural Changes Seated upright at 90 degrees   CHL IP OTHER RECOMMENDATIONS 12/27/2016 Recommended Consults -- Oral Care Recommendations Oral care BID Other Recommendations Order thickener from pharmacy   CHL IP FOLLOW UP RECOMMENDATIONS 12/27/2016 Follow up Recommendations (No Data)   CHL IP FREQUENCY AND DURATION 12/27/2016 Speech Therapy Frequency (ACUTE ONLY) min 2x/week Treatment Duration 2 weeks      CHL IP ORAL PHASE 12/27/2016 Oral Phase  WFL Oral - Pudding Teaspoon -- Oral - Pudding Cup -- Oral - Honey Teaspoon -- Oral - Honey Cup -- Oral - Nectar Teaspoon -- Oral - Nectar Cup -- Oral - Nectar Straw -- Oral - Thin Teaspoon -- Oral - Thin Cup -- Oral - Thin Straw -- Oral - Puree -- Oral - Mech Soft -- Oral - Regular -- Oral - Multi-Consistency -- Oral - Pill -- Oral Phase - Comment --  CHL IP PHARYNGEAL PHASE 12/27/2016 Pharyngeal Phase Impaired Pharyngeal- Pudding Teaspoon -- Pharyngeal -- Pharyngeal- Pudding Cup -- Pharyngeal -- Pharyngeal- Honey Teaspoon Delayed swallow initiation-vallecula;Pharyngeal residue - valleculae;Pharyngeal residue - pyriform;Reduced tongue base retraction;Reduced laryngeal elevation;Penetration/Apiration after swallow Pharyngeal Material enters airway, passes BELOW cords without attempt by patient to eject out (silent aspiration);Material enters airway, remains ABOVE vocal cords and not ejected out Pharyngeal- Honey Cup -- Pharyngeal -- Pharyngeal- Nectar Teaspoon Delayed swallow initiation-vallecula;Penetration/Aspiration during swallow;Pharyngeal residue - valleculae;Pharyngeal residue - pyriform;Reduced tongue base retraction;Reduced laryngeal elevation Pharyngeal Material enters airway, passes  BELOW cords without attempt by patient to eject out (silent aspiration) Pharyngeal- Nectar Cup -- Pharyngeal -- Pharyngeal- Nectar Straw -- Pharyngeal -- Pharyngeal- Thin Teaspoon -- Pharyngeal -- Pharyngeal- Thin Cup -- Pharyngeal -- Pharyngeal- Thin Straw -- Pharyngeal -- Pharyngeal- Puree Delayed swallow initiation-vallecula Pharyngeal -- Pharyngeal- Mechanical Soft -- Pharyngeal -- Pharyngeal- Regular -- Pharyngeal -- Pharyngeal- Multi-consistency -- Pharyngeal -- Pharyngeal- Pill -- Pharyngeal -- Pharyngeal Comment --  CHL IP CERVICAL ESOPHAGEAL PHASE 12/27/2016 Cervical Esophageal Phase WFL Pudding Teaspoon -- Pudding Cup -- Honey Teaspoon -- Honey Cup -- Nectar Teaspoon -- Nectar Cup -- Nectar Straw -- Thin Teaspoon -- Thin Cup -- Thin Straw -- Puree -- Mechanical Soft -- Regular -- Multi-consistency -- Pill -- Cervical Esophageal Comment -- No flowsheet data found. [place] Siren 12/27/2016, 10:44 AM Orbie Pyo Colvin Caroli.Ed CCC-SLP Pager [phone]                Medications:  Scheduled: . aspirin  325 mg Oral Daily  . chlorhexidine gluconate (MEDLINE KIT)  15 mL Mouth Rinse BID  . clonazePAM  0.5 mg Oral TID  . darunavir-cobicistat  1 tablet Oral Q breakfast  . dolutegravir  50 mg Oral Daily  . emtricitabine-tenofovir AF  1 tablet Oral Daily  . feeding supplement (PRO-STAT SUGAR FREE 64)  30 mL Oral BID  . fentaNYL  50 mcg Transdermal Q72H  . fluconazole  200 mg Oral Daily  . free water  200 mL Per Tube Q6H  . heparin  5,000 Units Subcutaneous Q8H  . insulin aspart  0-15 Units Subcutaneous Q4H  . insulin glargine  10 Units Subcutaneous BID  . lactulose  10 g Oral BID  . LORazepam  2 mg Intravenous Q6H  . mouth rinse  15 mL Mouth Rinse QID  . meropenem (MERREM) IV  2 g Intravenous Q8H  . [START ON 12/29/2016] methylPREDNISolone (SOLU-MEDROL) injection  20 mg Intravenous QODAY  . pantoprazole sodium  40 mg Oral Daily  . scopolamine  1 patch Transdermal Q72H  .  sulfamethoxazole-trimethoprim  20 mL Oral Daily  . vancomycin  1,250 mg Intravenous Q8H   Continuous: . dextrose 5 % and 0.9 % NaCl with KCl 20 mEq/L 75 mL/hr at 12/28/16 0512  . feeding supplement (JEVITY 1.2 CAL) Stopped (12/24/16 1832)   GGE:[id number], chlorproMAZINE (THORAZINE) IV, docusate, fentaNYL (SUBLIMAZE) injection, guaiFENesin-dextromethorphan, haloperidol lactate, hydrALAZINE, RESOURCE THICKENUP CLEAR, sennosides      DVT Prophylaxis: Subcutaneous heparin    Code Status:  Full code  Family Communication: No family at bedside  Disposition Plan: not stable for discharge     LOS: 27 days   [facility] Pager [phone] 12/28/2016, 9:58 AM  If 7PM-7AM, please contact night-coverage at [email or web address], password [facility]

## 2016-12-28 NOTE — Progress Notes (Signed)
Pt.converted to Afib.at 0145;V/S taken ;b/p=139/84;hr=66[r=24;o2 sat=100 on humidified 02.& converted back to NSR @0217 .MD on call Schorr made aware.

## 2016-12-28 NOTE — Progress Notes (Signed)
Daily Progress Note   Patient Name: Lawrence Kane       Date: 12/28/2016 DOB: 10-Aug-1966  Age: 51 y.o. MRN#: [id number] Attending Physician: Reyne Dumas, MD Primary Care Physician: No primary care provider on file. Admit Date: 12/01/2016  Reason for Consultation/Follow-up: Non pain symptom management, Pain control and Psychosocial/spiritual support  Subjective: Lawrence Kane is alert and fully oriented in bed. He continues to be very frustrated and angry about his restricted diet. He also feels "this place is a shit-hole that I'm rotting in." His biggest complaint is ongoing pain and lack of "space to be me."  Length of Stay: 27  Current Medications: Scheduled Meds:  . aspirin  325 mg Oral Daily  . chlorhexidine gluconate (MEDLINE KIT)  15 mL Mouth Rinse BID  . clonazePAM  0.5 mg Oral TID  . darunavir-cobicistat  1 tablet Oral Q breakfast  . dolutegravir  50 mg Oral Daily  . emtricitabine-tenofovir AF  1 tablet Oral Daily  . feeding supplement (PRO-STAT SUGAR FREE 64)  30 mL Oral BID  . fentaNYL  50 mcg Transdermal Q72H  . fluconazole  200 mg Oral Daily  . free water  200 mL Per Tube Q6H  . heparin  5,000 Units Subcutaneous Q8H  . insulin aspart  0-15 Units Subcutaneous Q4H  . insulin glargine  15 Units Subcutaneous BID  . lactulose  10 g Oral BID  . LORazepam  2 mg Intravenous Q6H  . mouth rinse  15 mL Mouth Rinse QID  . meropenem (MERREM) IV  2 g Intravenous Q8H  . [START ON 12/29/2016] methylPREDNISolone (SOLU-MEDROL) injection  20 mg Intravenous QODAY  . pantoprazole sodium  40 mg Oral Daily  . scopolamine  1 patch Transdermal Q72H  . sulfamethoxazole-trimethoprim  20 mL Oral Daily  . vancomycin  1,250 mg Intravenous Q8H    Continuous Infusions: . dextrose 5 % and 0.9 % NaCl with KCl 20 mEq/L 75 mL/hr at 12/28/16 0512  .  feeding supplement (JEVITY 1.2 CAL) Stopped (12/24/16 1832)    PRN Meds: acetaminophen, chlorproMAZINE (THORAZINE) IV, docusate, fentaNYL (SUBLIMAZE) injection, guaiFENesin-dextromethorphan, haloperidol lactate, hydrALAZINE, RESOURCE THICKENUP CLEAR, sennosides  Physical Exam  Constitutional: He is oriented to person, place, and time. He appears well-developed and well-nourished. No distress.  HENT:  Left ear with healing skin tear, healing lesions on lips. Tongue with white coating, he would again not let me attempt oral care, so unclear if this is thrush versus pudding residue  Eyes: EOM are normal.  Neck: Normal range of motion. Neck supple.  Trach present on room air  Cardiovascular: Normal rate.   Pulmonary/Chest: Effort normal. He has no wheezes. He has rhonchi (throughout).  Trach with PMV in place. Thick secretions expectorated through trach. Site benign.  Abdominal: Soft. Bowel sounds are normal. He exhibits no distension. There is no tenderness.  Musculoskeletal:  Generalized weakness. Full movement of BUE. He did not want to move BLE when requested, but told me he had been walking today.  Neurological: He is alert and oriented to person, place, and time.  Orientation a little questionable. He refused to answer orientation questions, but did track our conversation consistently and remember our conversation from yesterday  Skin: Skin is warm and dry.  Known skin breakdown on scrotum, however he refused evaluation  Psychiatric: His speech is normal and behavior is normal. Thought content normal. His affect is blunt. He expresses impulsivity and inappropriate judgment. He exhibits abnormal recent memory.           Vital Signs: BP (!) 128/92 (BP Location: Right Arm)   Pulse (!) 58   Temp 97.9 F (36.6 C)   Resp 18   Ht '6\' 4"'$  (1.93 m)   Wt 90.4 kg (199 lb 6.4 oz)   SpO2 97%   BMI 24.27 kg/m  SpO2: SpO2: 97 % O2 Device: O2 Device: Not Delivered O2 Flow Rate: O2 Flow Rate  (L/min): 6 L/min  Intake/output summary:   Intake/Output Summary (Last 24 hours) at 12/28/16 1348 Last data filed at 12/28/16 0601  Gross per 24 hour  Intake             2980 ml  Output             1700 ml  Net             1280 ml   LBM: Last BM Date: 12/26/16 Baseline Weight: Weight: 93 kg (205 lb) Most recent weight: Weight: 90.4 kg (199 lb 6.4 oz)       Palliative Assessment/Data: PPS 50%   Flowsheet Rows   Flowsheet Row Most Recent Value  Intake Tab  Referral Department  Hospitalist  Unit at Time of Referral  Med/Surg Unit  Palliative Care Primary Diagnosis  Sepsis/Infectious Disease  Date Notified  12/24/16  Palliative Care Type  New Palliative care  Reason for referral  Clarify Goals of Care  Date of Admission  12/01/16  Date first seen by Palliative Care  12/25/16  # of days Palliative referral response time  1 Day(s)  # of days IP prior to Palliative referral  23  Clinical Assessment  Palliative Performance Scale Score  30%  Psychosocial & Spiritual Assessment  Palliative Care Outcomes  Patient/Family meeting held?  Yes  Who was at the meeting?  patient and mother on the phone  Palliative Care Outcomes  Clarified goals of care      Patient Active Problem List   Diagnosis Date Noted  . Goals of care, counseling/discussion   . Palliative care encounter   . Respiratory distress   . Agitation   . Hypernatremia   . Leukocytosis   . Acute blood loss anemia   . Pain   . Acute respiratory failure with hypoxia ([place])   . Tracheostomy status ([place])   . Hypoxemia   . Muscle weakness (generalized)   . Ileus ([place])   . Abdominal distention   . Transverse myelitis ([place])   . Encounter for orogastric (OG) tube placement   . Somnolence   . Paraplegia ([place])   . Acute pulmonary edema (HCC)   . Neurosyphilis   . Herpesviral meningitis   . Cerebral embolism with cerebral infarction 12/08/2016  . Endotracheally intubated   . Meningoencephalitis   . Acute  encephalopathy   . Acute respiratory failure ([place]) 12/02/2016  . Altered mental status   . COPD (chronic obstructive pulmonary disease) ([place]) 12/01/2016  . Substance abuse 12/01/2016  . Genital warts 12/01/2016  . HIV (human immunodeficiency virus infection) ([place]) 12/01/2016  . AIDS (acquired immune deficiency syndrome) ([place]) 12/01/2016  . Noncompliance 12/01/2016  . Bacterial meningitis 12/01/2016  . Hypertensive urgency 12/01/2016  . Septic shock ([place]) 12/01/2016  . Transaminitis 12/01/2016  .  Cocaine abuse 12/01/2016  . Open wound 12/01/2016    Palliative Care Assessment & Plan   HPI: 51 y.o. male  with past medical history of HIV/AIDS, chronic Hep B, COPD, Osteoporosis, genital warts, and cocaine use who originally presented to [facility] with AMS and agitation. He was intubated for airway protection and transferred to [facility]. He was admitted here on 12/01/2016. 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 He was intubated for acute respiratory failure, and a tracheostomy was placed. He did develop an ileus, however repeatedly pulled out his NG tube. Swallow evaluation pending.   Assessment: I met Davinder, his mother, brother, and sister-in-law at his bedside on 2/19. Please see my initial consult note for full details of that conversation. In brief, Daemon referred to his mother to make decisions about his code status, but was convinced he would die prior to discharge.Today, I called his mother to follow-up on our code status conversation. She did not feel she had adequate time to discuss these issues with Aaron, and asked to continue Full code status until she has a better chance to talk with her son. Murel continued to defer to his mother for decision making.   During my visit, Virl continuously mentioned his  uncontrolled pain and high anxiety. As I explored this further he related that he took percocet at home (despite stating a tylenol allergy), which was in addition to "smoking joints." I asked about the cocaine use, which he did acknowledge using recently. Prior to admission he has listed Percocet and Gabapentin as home medications. He could not recall what the gabapentin was for, and he reported the Percocet was for "body pain," but could be no more specific. He was also unable to tell me who prescribed the medication. I did a query of the controlled substances data base and found multiple red-flags in his history (copy of report in paper chart). He has been seeing multiple providers, filling multiple prescriptions for the same medication on the same or close days, and has been in both [place] and [place] seeking care. Given his known and active drug addiction, repeat doctor shopping, and persistent inappropriate use of prescription medications in the past year, I would be extremely judicious about controlled substances use in the hospital and would consider opioid use contraindicated in this patient after discharge unless managed by a pain clinic.  Recommendations/Plan:  Full code, continue full scope interventions  Symptom management Patients pain and anxiety regimen needs to be simplified with ongoing reduction in controlled substance use. Would avoid IV pain medication, as pt cleared for oral intake.  Pain:   Reduce Fentanyl patch to 77mcg (will plan to wean this off prior to discharge)  Start Oxycodone $RemoveBeforeD'5mg'wUklPARoMMqyuM$  q4H PRN for breakthrough pain  Stop IV Fentanyl  Restart home Gabapentin $RemoveBeforeD'600mg'RTkICpWFcVwNlF$  TID  Anxiety:  Increase Clonazepam to $RemoveBefor'1mg'TNKXfJZrzIPD$  TID  Stop IV Ativan  Goals of Care and Additional Recommendations:  Limitations on Scope of Treatment: Full Scope Treatment  Code Status:  Full code  Prognosis:   Unable to determine  Discharge Planning:  [place] for rehab with  Palliative care service follow-up  Care plan was discussed with pt, Dr. Hilma Favors (also Palliative Care), Primary team notified  Thank you for allowing the Palliative Medicine Team to assist in the care of this patient.  Time in/out: 1330/1430 Total time: 70 minutes  Greater than 50%  of this time was spent counseling and coordinating care related to the above assessment and plan.  Hinley Brimage, NP Palliative Medicine Team [phone] pager (7a-5p) Team Phone # [phone]  

## 2016-12-29 DIAGNOSIS — R52 Pain, unspecified: Secondary | ICD-10-CM

## 2016-12-29 DIAGNOSIS — F1410 Cocaine abuse, uncomplicated: Secondary | ICD-10-CM

## 2016-12-29 LAB — GLUCOSE, CAPILLARY
GLUCOSE-CAPILLARY: 108 mg/dL — AB (ref 65–99)
GLUCOSE-CAPILLARY: 189 mg/dL — AB (ref 65–99)
GLUCOSE-CAPILLARY: 311 mg/dL — AB (ref 65–99)
GLUCOSE-CAPILLARY: 296 mg/dL — AB (ref 65–99)
Glucose-Capillary: 105 mg/dL — ABNORMAL HIGH (ref 65–99)
Glucose-Capillary: 127 mg/dL — ABNORMAL HIGH (ref 65–99)
Glucose-Capillary: 307 mg/dL — ABNORMAL HIGH (ref 65–99)

## 2016-12-29 MED ORDER — PANTOPRAZOLE SODIUM 40 MG PO PACK: 40.0000 mg | PACK | Freq: Every day | ORAL | 1 refills | Status: AC

## 2016-12-29 MED ORDER — FLUCONAZOLE 200 MG PO TABS: 200.0000 mg | ORAL_TABLET | Freq: Every day | ORAL | 0 refills | Status: AC

## 2016-12-29 MED ORDER — SODIUM CHLORIDE 0.9 % IV SOLN
INTRAVENOUS | Status: DC
  Administered 2016-12-29 – 2017-01-02 (×5): via INTRAVENOUS
  Administered 2017-01-03: 1000 mL via INTRAVENOUS
  Administered 2017-01-04: 07:00:00 via INTRAVENOUS

## 2016-12-29 MED ORDER — CLONAZEPAM 1 MG PO TABS: 1.0000 mg | ORAL_TABLET | Freq: Three times a day (TID) | ORAL | 0 refills | Status: DC

## 2016-12-29 MED ORDER — FENTANYL 25 MCG/HR TD PT72: 25.0000 ug | MEDICATED_PATCH | TRANSDERMAL | 0 refills | Status: DC

## 2016-12-29 MED ORDER — GUAIFENESIN-DM 100-10 MG/5ML PO SYRP: 5.0000 mL | ORAL_SOLUTION | ORAL | 0 refills | Status: AC | PRN

## 2016-12-29 MED ORDER — DARUNAVIR-COBICISTAT 800-150 MG PO TABS: 1.0000 | ORAL_TABLET | Freq: Every day | ORAL | 0 refills | Status: AC

## 2016-12-29 MED ORDER — INSULIN ASPART 100 UNIT/ML ~~LOC~~ SOLN: SUBCUTANEOUS | 11 refills | Status: AC

## 2016-12-29 MED ORDER — OXYCODONE HCL 5 MG PO TABS: 5.0000 mg | ORAL_TABLET | ORAL | 0 refills | Status: DC | PRN

## 2016-12-29 MED ORDER — DOCUSATE SODIUM 50 MG/5ML PO LIQD: 100.0000 mg | Freq: Two times a day (BID) | ORAL | 0 refills | Status: AC | PRN

## 2016-12-29 MED ORDER — SULFAMETHOXAZOLE-TRIMETHOPRIM 200-40 MG/5ML PO SUSP: 20.0000 mL | Freq: Every day | ORAL | 2 refills | Status: AC

## 2016-12-29 MED ORDER — ACETAMINOPHEN 325 MG PO TABS: 650.0000 mg | ORAL_TABLET | ORAL | 0 refills | Status: DC | PRN

## 2016-12-29 MED ORDER — ASPIRIN 325 MG PO TABS: 325.0000 mg | ORAL_TABLET | Freq: Every day | ORAL | 0 refills | Status: AC

## 2016-12-29 MED ORDER — EMTRICITABINE-TENOFOVIR AF 200-25 MG PO TABS: 1.0000 | ORAL_TABLET | Freq: Every day | ORAL | 0 refills | Status: AC

## 2016-12-29 MED ORDER — INSULIN GLARGINE 100 UNIT/ML ~~LOC~~ SOLN: 15.0000 [IU] | Freq: Two times a day (BID) | SUBCUTANEOUS | 11 refills | Status: DC

## 2016-12-29 MED ORDER — OLANZAPINE 5 MG PO TABS
5.0000 mg | ORAL_TABLET | Freq: Every day | ORAL | Status: DC
  Administered 2016-12-29 – 2017-01-03 (×6): 5 mg via ORAL
  Filled 2016-12-29 (×7): qty 1

## 2016-12-29 MED ORDER — DOLUTEGRAVIR SODIUM 50 MG PO TABS: 50.0000 mg | ORAL_TABLET | Freq: Every day | ORAL | 1 refills | Status: AC

## 2016-12-29 NOTE — Progress Notes (Signed)
RT called to check patient trach. RN was concerned it was out. When I arrived, trach was in place. Vitals all WNL. Spoke with RN and she agreed he was fine.

## 2016-12-29 NOTE — Progress Notes (Signed)
Pt has been agitated all alternative measures to calm him down failed, he pulled off  his iv line, tele box and trach, pt has been put on wrist restraint per on call  Nurse practitioner's  Order, will continue to monitor

## 2016-12-29 NOTE — Progress Notes (Signed)
Patient is easily agitated and slamming objects around his room. Paged Schorr,NP for order for wrist restraints to be renewed. Schorr,NP returned page and gave RN verbal order to renew order for wrist restraints. RN renewed order at this time. Will continue to monitor and treat per MD orders.

## 2016-12-29 NOTE — Progress Notes (Signed)
Paged Schorr,NP about patient's need for continuous fluid of D5 and 0.9% NS with KCl 20mEq. Since patient has a diet now and is eating/drinking and sugars have been elevated this evening, Schorr,NP gave verbal order to discontinue those continuous fluids. Schorr,NP then gave verbal order to start normal saline at 7675mL/hr. RN entered in verbal orders. Will continue to monitor and treat per MD orders.

## 2016-12-29 NOTE — Progress Notes (Signed)
Called by RN that patient had pulled trach out. Lawrence Kane was placed back in by RT (6 Shiley cuffless), CO2 detector had positive color change, BBS were heard. Patient had strong productive cough and was suctioned before placing cap back on patient. Lawrence Kane ties are secure. RT will continue to monitor. Extra trach ordered for room.

## 2016-12-29 NOTE — Progress Notes (Signed)
Daily Progress Note   Patient Name: Lawrence Kane       Date: 12/29/2016 DOB: 02/17/1966  Age: 51 y.o. MRN#: [id number] Attending Physician: Reyne Dumas, MD Primary Care Physician: No primary care provider on file. Admit Date: 12/01/2016  Reason for Consultation/Follow-up: Non pain symptom management, Pain control and Psychosocial/spiritual support  Subjective: Raheem is alert and fully oriented in bed. He had a difficult night wherein he became agitated, unable to be redirected, and proceeded to pull out his trach, IV, and remove his telemetry. He required wrist restraints. This morning he is unchanged from yesterday. He remembers the events from overnight and explained that he took those actions so he could leave the hospital. He has a number of bills due today: rent and  motorcycle/truck insurance, and he planned to walk out with a rolling walker so he could get home to pay them. This morning, he is frustrated by the restraints, but continues to verbalize a plan to walk out of the hospital and "take care of my things." Please see below for our conversation. Additionally, he is persistently asking for more pain medication, including IV dilaudid.   Length of Stay: 28  Current Medications: Scheduled Meds:  . aspirin  325 mg Oral Daily  . chlorhexidine gluconate (MEDLINE KIT)  15 mL Mouth Rinse BID  . clonazePAM  1 mg Oral TID  . darunavir-cobicistat  1 tablet Oral Q breakfast  . dolutegravir  50 mg Oral Daily  . emtricitabine-tenofovir AF  1 tablet Oral Daily  . fentaNYL  25 mcg Transdermal Q72H  . fluconazole  200 mg Oral Daily  . gabapentin  600 mg Oral TID  . heparin  5,000 Units Subcutaneous Q8H  . insulin aspart  0-15 Units Subcutaneous Q4H  . insulin glargine  15 Units Subcutaneous BID  . lactulose  10 g Oral BID  . mouth rinse   15 mL Mouth Rinse QID  . meropenem (MERREM) IV  2 g Intravenous Q8H  . OLANZapine  5 mg Oral QHS  . pantoprazole sodium  40 mg Oral Daily  . scopolamine  1 patch Transdermal Q72H  . sulfamethoxazole-trimethoprim  20 mL Oral Daily  . vancomycin  1,250 mg Intravenous Q8H    Continuous Infusions: . dextrose 5 % and 0.9 % NaCl with KCl 20 mEq/L 75 mL/hr at 12/28/16 0512  . feeding supplement (JEVITY 1.2 CAL) Stopped (12/24/16 1832)    PRN Meds: acetaminophen, chlorproMAZINE (THORAZINE) IV, docusate, guaiFENesin-dextromethorphan, haloperidol lactate, hydrALAZINE, oxyCODONE, RESOURCE THICKENUP CLEAR, sennosides  Physical Exam  Constitutional: He is oriented to person, place, and time. He appears well-developed and well-nourished. No distress.  HENT:  Left ear with healing skin tear, healing lesions on lips. Tongue now with brown coating.   Eyes: EOM are normal.  Neck: Normal range of motion. Neck supple.  Trach present on room air  Cardiovascular: Normal rate.   Pulmonary/Chest: Effort normal. He has no wheezes. He has rhonchi (throughout).  Trach in place, capped. Thick secretions around trach site. Voice remains only a whisper.   Abdominal: Soft. Bowel sounds are normal. He exhibits no distension. There is no tenderness.  Musculoskeletal:  Generalized weakness. Full movement of BUE. Conflicting report  on movement of BLE. He says he can move them--but doesn't on request. States they will work better when he is allowed to go walking.  Neurological: He is alert and oriented to person, place, and time.  Orientation a little questionable. He refused to answer orientation questions, but did track our conversation consistently and remember our conversation from yesterday  Skin: Skin is warm and dry.  Known skin breakdown on scrotum, however he refused evaluation  Psychiatric: His behavior is normal. His affect is blunt. His speech is slurred (whisper). He expresses impulsivity and  inappropriate judgment. He exhibits abnormal recent memory.  Concern for delusional thinking. Believes he can walk out of the hospital and to home with a rolling walker.            Vital Signs: BP (!) 138/96 (BP Location: Left Arm)   Pulse 62   Temp 97.9 F (36.6 C) (Oral)   Resp 18   Ht _0  (1.93 m)   Wt 93.4 kg (205 lb 14.6 oz)   SpO2 100%   BMI 25.06 kg/m  SpO2: SpO2: 100 % O2 Device: O2 Device: Not Delivered O2 Flow Rate: O2 Flow Rate (L/min): 6 L/min  Intake/output summary:   Intake/Output Summary (Last 24 hours) at 12/29/16 1151 Last data filed at 12/29/16 1004  Gross per 24 hour  Intake             1450 ml  Output             2750 ml  Net            -1300 ml   LBM: Last BM Date: 12/28/16 Baseline Weight: Weight: 93 kg (205 lb) Most recent weight: Weight: 93.4 kg (205 lb 14.6 oz)       Palliative Assessment/Data: PPS 50%   Flowsheet Rows   Flowsheet Row Most Recent Value  Intake Tab  Referral Department  Hospitalist  Unit at Time of Referral  Med/Surg Unit  Palliative Care Primary Diagnosis  Sepsis/Infectious Disease  Date Notified  12/24/16  Palliative Care Type  New Palliative care  Reason for referral  Clarify Goals of Care  Date of Admission  12/01/16  Date first seen by Palliative Care  12/25/16  # of days Palliative referral response time  1 Day(s)  # of days IP prior to Palliative referral  23  Clinical Assessment  Palliative Performance Scale Score  30%  Psychosocial & Spiritual Assessment  Palliative Care Outcomes  Patient/Family meeting held?  Yes  Who was at the meeting?  patient and mother on the phone  Palliative Care Outcomes  Clarified goals of care      Patient Active Problem List   Diagnosis Date Noted  . Goals of care, counseling/discussion   . Palliative care encounter   . Respiratory distress   . Agitation   . Hypernatremia   . Leukocytosis   . Acute blood loss anemia   . Pain   . Acute respiratory failure with hypoxia  ([place])   . Tracheostomy status ([place])   . Hypoxemia   . Muscle weakness (generalized)   . Ileus ([place])   . Abdominal distention   . Transverse myelitis ([place])   . Encounter for orogastric (OG) tube placement   . Somnolence   . Paraplegia ([place])   . Acute pulmonary edema (HCC)   . Neurosyphilis   . Herpesviral meningitis   . Cerebral embolism with cerebral infarction 12/08/2016  . Endotracheally intubated   . Meningoencephalitis   .  Acute encephalopathy   . Acute respiratory failure ([place]) 12/02/2016  . Altered mental status   . COPD (chronic obstructive pulmonary disease) ([place]) 12/01/2016  . Substance abuse 12/01/2016  . Genital warts 12/01/2016  . HIV (human immunodeficiency virus infection) ([place]) 12/01/2016  . AIDS (acquired immune deficiency syndrome) ([place]) 12/01/2016  . Noncompliance 12/01/2016  . Bacterial meningitis 12/01/2016  . Hypertensive urgency 12/01/2016  . Septic shock ([place]) 12/01/2016  . Transaminitis 12/01/2016  . Cocaine abuse 12/01/2016  . Open wound 12/01/2016    Palliative Care Assessment & Plan   HPI: 51 y.o. male  with past medical history of HIV/AIDS, chronic Hep B, COPD, Osteoporosis, genital warts, and cocaine use who originally presented to [facility] with AMS and agitation. He was intubated for airway protection and transferred to [facility]. He was admitted here on 12/01/2016. 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 He was intubated for acute respiratory failure, and a tracheostomy was placed. He did develop an ileus, however repeatedly pulled out his NG tube. Swallow evaluation pending.   Assessment: I met Samin, his mother, brother, and sister-in-law at his bedside on 2/19. Please see my initial consult note for full details of that conversation. In brief, Shervin referred to his mother  to make decisions about his code status, but was convinced he would die prior to discharge.On 2/20, I called his mother to follow-up on our code status conversation. She did not feel she had adequate time to discuss these issues with Joel, and asked to continue Full code status until she has a better chance to talk with her son. Romulus continued to defer to his mother for decision making.   On 2/20: During my visit, Jatavious continuously mentioned his uncontrolled pain and high anxiety. As I explored this further he related that he took percocet at home (despite stating a tylenol allergy), which was in addition to "smoking joints." I asked about the cocaine use, which he did acknowledge using recently. Prior to admission he has listed Percocet and Gabapentin as home medications. He could not recall what the gabapentin was for, and he reported the Percocet was for "body pain," but could be no more specific. He was also unable to tell me who prescribed the medication. I did a query of the controlled substances data base and found multiple red-flags in his history (copy of report in paper chart). He has been seeing multiple providers, filling multiple prescriptions for the same medication on the same or close days, and has been in both [place] and [place] seeking care. Given his known and active drug addiction, repeat doctor shopping, and persistent inappropriate use of prescription medications in the past year, I would be extremely judicious about controlled substances use in the hospital and would consider opioid use contraindicated in this patient after discharge unless managed by a pain clinic.  Today, Reymundo could recall his behaviors overnight and gave the impression that they were an intentional attempt to enable him to leave the hospital. He has a number of bills due today, and he planned to leave and walk home in order to ensure they were taken care of. I questioned his capacity to walk, given  reports by physical therapy, but he dismissed my concerns. I also reiterated the rationale for hospitalization and the need for antibiotics, lab work, and ongoing care. He acknowledged these things, but remained insistent that his  priority was paying his bills. Our conversation then shifted as he asked for IV dilaudid. I reinforced our conversation yesterday about the goal of finding a pain regimen that would be sustainable when he left the hospital, specifically that we would not be using IV pain medication anymore, and we would working to titrate off of the opioids. While he was calm and accepting of this, he did request IV dilaudid another three times later in our conversation.  Recommendations/Plan:  Full code, continue full scope interventions  Will start Zyprexa 41m qHS to help with agitation overnight  I would STRONGLY recommend a Psych consult. He has clear drug-seeking behavior, a known ongoing drug addiction, and behaviors that are concerning for delusional thinking and potential self-harm (i.e pulling out trach, IV, and planning to walk out of the hospital).  CM asked to assist pt in getting his bills paid (he states he has the money, just isn't sure how to get the money to the company as he is in the hospital).  Symptom management Patients pain and anxiety regimen needs to be simplified with ongoing reduction in controlled substance use. Would avoid IV pain medication, as pt cleared for oral intake.  Pain:   Fentanyl patch 229m (will plan to remove this in next 24-48 hours)  Oxycodone 57m26m4H PRN for breakthrough pain   Continue Gabapentin 600m14mD  Anxiety:  Clonazepam to 1mg 98m  Start Zyprexa 57mg q72m Goals of Care and Additional Recommendations:  Limitations on Scope of Treatment: Full Scope Treatment  Code Status:  Full code  Prognosis:   Unable to determine  Discharge Planning:  [facility] with Palliative care service  follow-up  Care plan was discussed with pt, care nurse, Primary team notified  Thank you for allowing the Palliative Medicine Team to assist in the care of this patient.  Time in/out: 1130/1210 Total time: 75 minutes    Greater than 50%  of this time was spent counseling and coordinating care related to the above assessment and plan.  Gwendelyn Lanting Charlynn Courtalliative Medicine Team [phone] (7a-5p) Team Phone # [phone]

## 2016-12-29 NOTE — Progress Notes (Signed)
[facility] PROGRESS NOTE  Lawrence Kane YDX:[id number] DOB: [birth year] DOA: 12/01/2016  PCP: No primary care provider on file.  Brief History/Interval Summary:Lawrence Kane is a 51 y.o. male with history of COPD, HIV, substance abuse, noncompliance, recent surgery in [place];  who was admitted via [facility] on 12/01/16 with AMS changes and septic shock due to bacterial meningitis. He was intubated for airway protection and started on broad spectrum antibiotics as well as acyclovir for scrotal lesions. LP with high protein, low glucose and high WBC with predominance of eosinophils. ID consulted and questioned parasitic infection. Repeat LP 1/29 showed elevated glucose, elevated WBC with 28% eosinophils. BAL cytology/wet mount negative for malignant cells or parasites. MRI brain done 1/27 due to ongoing decrease in level of responsiveness, decrease in movement of BUE and flaccid BLE. MRI brain reviewed, showing small infarcts in cerebellum and cortex.  ?due to vasospasms or central/embolic disease and generalized sinuitis/mastoidits. Neurology questioned HSV vasculopathy and recommended MRA brain for work up. MRA brain revealed irregular R-ICA no large vessel stenosis and beaded appearance of right cervical ICA compatible with fibromuscular dysplasia.  Infarcts felt to be due to vasculitis/vasospams secondary to meningoencephalitis. MRI total spine was negative for abscess or hematoma, generalized meningitis and radiculitis and multifocal dorsal thoracic cord signal abnormality from myelitis. 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   He has had loose stools due to partial SBO v/s ileus. Agitation improving. He has completed treatment for  ESBL K-pneumoniae in BAL.   He was unable to tolerate vent wean requiring tracheostomy 2/5. He has been weaned to ATC for past 2 days but continues to have  copious purulent secretions via trach as well as restraints due to poor safety. Therapy ongoing and CIR recommended for follow up therapy.     . Subsequently, patient developed an ileus. NG tube was placed to intermittent suction.  Patient has pulled out his NG tube multiple times this admission. Still unable to swallow. Speech therapy consultation is pending     Assessment/Plan: Acute Hypoxemic Respiratory Failure , Tracheostomy 2/5 This was secondary to HCAP ESBL Klebsiella LLL+ sepsis + Unable to protect airway + pulm edema. Patient was on the ventilator for a prolonged duration. Subsequently underwent tracheostomy placement on 2/5. Pulmonology is following. Patient is off of the Precedex infusion.    Noted to have copious mucopurulent secretions, despite receiving   multiple courses of antibiotics between 1/24-2/7.  Chest x-ray 2/15 showed worsening bronchopneumonia.  patient restarted on meropenem and vancomycin 2/15 ,continue through 2/22. Appreciate PCCM recommendations,Down sized  to # 6 cuffless 2/20.  Will initiate capping trials; ideally  would like him more mobile or at least show me stronger phonation prior to decannulation.  Pulmonary to make further recommendations prior to discharge. Started on a diet, Speech therapy recommends - Dysphagia 1 (Puree) solids;Pudding thick liquid   pulled  out tracheostomy 2/20, it was replaced by RT    Hypoglycemia/hyperglycemia Continue Lantus   15 units twice a day, sliding scale insulin  Dysarthria,  ruled out for acute CVA, MRI of the brain   was negative     Scrotal wound/history of genital warts Left scrotal area wound is deeper with odor and the head of the penis has wart like growths that area much worse than at the time of admission Started on fluconazole IV Periwound: various wart like  lesion, patient with history of genital warts known at the time of admission Awaiting records from outside facility, details of what kind of surgery  he had Appreciate urology input-Dr [facility] s/p local excision of scrotal condyloma by Urologist in [place] local wound care  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 HSV positive on CSF from [facility]. Infectious disease has been following. Treated with acyclovir.  for total of 21 days. Completed treatment on 2/17.  Continue Bactrim for PCP prophylaxis.  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 Patient is on acyclovir. High-dose steroids 1000 mg/day , given for 5 days. Now on low-dose steroid. This will need to be slowly tapered down. Neurology has seen the patient. On fentanyl patch for pain issues. Started during this hospitalization.Re-image spine in 2-3 weeks to assess for improvement. Neurology signed off 2/12.  Ileus Improved. Patient had NG tube placed 2/8. Patient has removed his NG several times.     DM, Hyperglycemia  HbA1c 6.6 on 1/26. Continue to adjust insulin based on by mouth intake     Bradycardia Heart rate has improved. Bradycardia was most likely due to medication such as versed. TSH is normal. Continue to monitor.   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 On aspirin. Echocardiogram shows normal systolic function. LDL 93. PT and OT evaluation. No significant carotid stenosis noted on MRA neck.  he was seen by neurology. They had mentioned that a TEE could be considered once the patient was more stable. This can be pursued at a later date, although embolic stroke appeared to be less of a possibility based on his clinical scenario. Repeat MRI negative for acute CVA  Pulmonary edema Seems to have improved. Continue intermittent IV Lasix,    Moderate to Severe Protein Calorie Malnutrition  We  l resumed oral feeding  on 2/19    Hypernatremia Free water to continue.  Increase to every 6 hours. Repeat labs tomorrow. Tube feedings on hold. Sodium level should come down.  Transaminitis Worsened by seroquel likely which was discontinued on 1/31. Improved.   HCV ab+, Chronic hep b Management will be pursued as outpatient.  Normocytic anemia Hemoglobin stable. No evidence for overt bleeding. Continue to monitor.  LLL PNA with ESBL Klebsiella  Restarted antibiotics 2/15, stop 2/22  HIV On antiretroviral treatment. Infectious disease was following.  Metabolic Encephalopathy Continue to monitor. Seems to be stable. PRN Sedative agents for agitation. Restraints in place to prevent him from pulling out supportive tubes.  History of Cocaine Abuse Will need counseling when able to communicate effectively  Disposition-bedside meeting with palliative care, mother/ sister  on 2/19 completed   Consultants: Infectious disease. Critical care medicine. Neurology.  STUDIES:  1/24 CXR >> ATX vs LLL infiltrate 1/25 CT head >> neg for acute intracranial process 1/25 LP cytology >> no malignant cells 1/25 LP >> yellow, cloudy, glucose 20, total protein >600, RBC 114>49, WBC 970, 36% neutrophil, 20% lymphs, 19% eos 1/28 BAL cell count >> 266 WBC, eos 1 1/28 BAL cytology >> no malignant cells 1/27 BAL for wet mount> neg for parasites 1/27 MRI brain >> small acute infarcts in cerebellum and cerebral cortex, sucal signal 1/29 LP >> Glucose 82, protein >600, WBC 600>885, eosinophils 28% 1/29 Echo >> LV EF 60-65%, indeterminent diastolic function, wall motion normal 1/30 EEG >> diffuse slowing and background  suppression, no seizures 2/3 MRI thoracic/lumbar/cervical >> (-) abscess. Meningitis. R/O CMV   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 1/24 Cryptococcal [name]): negative 1/24 CSF [facility]): NGTD 1/22 scrotal wound cx [name]): MRSA Toxoplasma [name]): Negative 1/25BCx x2 : NGTD 1/25 HCV ab  + 1/24 Trach asp 1/24: NGTD 1/24MRSA PCR: MRSA pos 1/25 HIV quant: 80, CD4 10 1/25 CSF AFB: Smear neg, >> 1/25 CSF fungal cx: NGTD 1/25 CSF crypto ag: neg 1/25 CSF gram stain no organisms, cx: NGTD 1/27 strongyloides ab >> negative 1/27 coccidiomycosis ab >> 1/28 BAL AFB smear/cx> 1/28 BAL Fungus cx >> 1/28 BAL gram stain/Cx: 40K ESBL Klebsiella pneumoniae 1/28 BAL pneumocystis smear: Neg 1/27 O&P >> 1/29 CSF Cx >> no organisms on gram stain, >> NGTD 2/04 CSF CMV >> not done?   ANTIBIOTICS: Rocephin 1/24>>1/30 Vancomycin 1/24>>1/30; 2/2 >  Acyclovir 1/24>> 1/26, 1/27>> Ampicillin 1/24>> 1/27 Bactrim M/W/F for PCP prophylaxis Meropenem 1/30>> 2/1; 2/2 >  Pen G 2/1 >> 2/2 Cipro 2/1 >> 2/2 Meropenem/vancomycin-2/15-  SIGNIFICANT EVENTS: 1/24  Transferred from [facility] 1/25  Underwent LP here 2/05  Trach 2/06  To ATC 2/8  patient developed ileus  LINES/TUBES: L IJ CVL 1/24 >> ETT 1/24 >> 2/5 Foley 1/24 >> OGT 1/24 >> 2/5 Trach (df) 2/5 >>   Subjective/Interval H istory:  pulled trach out. Lurline Idol was placed back in by RT (6 Shiley cuffless) Patient continues to refuse labs  ROS: Unable to do.  Objective:  Vital Signs  Vitals:   12/29/16 0033 12/29/16 0408 12/29/16 0608 12/29/16 0810  BP: 130/79  (!) 138/96   Pulse: 83 74 66 (!) 56  Resp: _0 Temp: 97.9 F (36.6 C)  97.9 F (36.6 C)   TempSrc: Oral  Oral   SpO2: 100% 98% 98% 100%  Weight:   93.4 kg (205 lb 14.6 oz)   Height:        Intake/Output Summary (Last 24 hours) at 12/29/16 0904 Last data filed at 12/29/16 0631  Gross per 24 hour  Intake              250 ml  Output             2400 ml  Net            -2150 ml   Filed Weights   12/27/16 0500 12/28/16 0443 12/29/16 0608  Weight: 82.9 kg (182 lb 12.8 oz) 90.4 kg (199 lb 6.4 oz) 93.4 kg (205 lb 14.6 oz)    General appearance: Patient is awake and alert. Neck: Tracheostomy noted Resp: Good air entry bilaterally. No wheezing,  rales or rhonchi. diminished at the bases.  Cardio: S1, S2, is bradycardic, regular. No S3, S4. No rubs, murmurs, or bruit. Minimal pedal edema.  GI: Abdomen remains soft. Not distended. Bowel sounds are present. No masses or organomegaly. Nontender.  Extremities: Minimal edema noted bilateral lower extremities Pulses: 2+ and symmetric Neurologic: Patient is awake and alert. Moving his upper extremities. No movement noted in the lower extremities. : Large open wound on left lateral portion of scrotum with purulent discharge. Foley in place.   Lab Results:  Data Reviewed: I have personally reviewed following labs and imaging studies  CBC:  Recent Labs Lab 12/23/16 0447 12/24/16 0402 12/27/16 0511  WBC 9.5 9.4 7.6  HGB 10.7* 11.0* 12.1*  HCT 35.3* 34.9* 38.0*  MCV 98.6 95.9 94.8  PLT 384 362 289  Basic Metabolic Panel:  Recent Labs Lab 12/23/16 0447 12/25/16 0605 12/26/16 0622 12/27/16 0511  NA 144 139 137 136  K 4.5 3.7 3.2* 4.0  CL 115* 107 106 103  CO2 _0 GLUCOSE 277* 119* 80 153*  BUN 37* 23* 22* 15  CREATININE 0.87 0.57* 0.60* 0.61  CALCIUM 9.1 8.7* 8.5* 8.3*    GFR: Estimated Creatinine Clearance: 134.1 mL/min (by C-G formula based on SCr of 0.61 mg/dL).  Liver Function Tests:  Recent Labs Lab 12/23/16 0447 12/26/16 0622  AST 54* 55*  ALT 90* 83*  ALKPHOS 61 62  BILITOT 0.6 1.1  PROT 7.3 6.9  ALBUMIN 2.1* 2.0*     Coagulation Profile: No results for input(s): INR, PROTIME in the last 168 hours.  CBG:  Recent Labs Lab 12/28/16 1659 12/28/16 1953 12/29/16 0009 12/29/16 0424 12/29/16 0824  GLUCAP 276* 239* 127* 105* 108*     No results found for this or any previous visit (from the past 240 hour(s)).    Radiology Studies: Dg Swallowing Func-speech Pathology  Result Date: 12/27/2016 Objective Swallowing Evaluation: Type of Study: MBS-Modified Barium Swallow Study Patient Details Name: Lawrence Kane MRN: [id number] Date  of Birth: 02-04-1966 Today's Date: 12/27/2016 Time: SLP Start Time (ACUTE ONLY): 0930-SLP Stop Time (ACUTE ONLY): 0950 SLP Time Calculation (min) (ACUTE ONLY): 20 min Past Medical History: Past Medical History: Diagnosis Date . COPD (chronic obstructive pulmonary disease) ([place])  . Genital warts  . HIV (human immunodeficiency virus infection) ([place])  . Osteoporosis  Past Surgical History: Past Surgical History: Procedure Laterality Date . PROSTATE SURGERY  11/17/2016 HPI: 51 year old man with HIV/AIDS (first diagnosed 2003, COPD, and cocaine substance abuse, recent surgery in [place] (?) who presented to [facility] on 1/24 with complaint of altered mental status. Found to be hypertensive to 240/112. 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 No Data Recorded Assessment / Plan / Recommendation CHL IP CLINICAL IMPRESSIONS 12/27/2016 Clinical Impression MBS completed wearing Passy-Muir speaking valve. Pharyngeal impairments primarily originate from decreased sensation with minimal motor deficits. Valleculae was site of swallow initiation for most trials. Pyriform sinsus appear shallow and small with min-mild residue silently aspirated after the swallow. Larygneal penetration consistent during the swallow with honey thick barium without awareness. Recommend Dys 1 (puree) only and pudding thick liquids (nothing thinner than puree/pudding), donn speaking valve with all meals/meds, slow rate, small bites.   SLP Visit Diagnosis Dysphagia, pharyngeal phase (R13.13) Attention and concentration deficit following -- Frontal lobe and executive function deficit following -- Impact on safety and function Moderate aspiration risk   CHL IP TREATMENT RECOMMENDATION 12/27/2016 Treatment Recommendations Therapy as outlined in treatment plan below   Prognosis 12/27/2016 Prognosis for Safe Diet  Advancement Good Barriers to Reach Goals -- Barriers/Prognosis Comment -- CHL IP DIET RECOMMENDATION 12/27/2016 SLP Diet Recommendations Dysphagia 1 (Puree) solids;Pudding thick liquid Liquid Administration via Spoon Medication Administration Crushed with puree Compensations Slow rate;Small sips/bites Postural Changes Seated upright at 90 degrees   CHL IP OTHER RECOMMENDATIONS 12/27/2016 Recommended Consults -- Oral Care Recommendations Oral care BID Other Recommendations Order thickener from pharmacy   CHL IP FOLLOW UP RECOMMENDATIONS 12/27/2016 Follow up Recommendations (No Data)   CHL IP FREQUENCY AND DURATION 12/27/2016 Speech Therapy Frequency (ACUTE ONLY) min 2x/week Treatment Duration 2 weeks      CHL IP ORAL PHASE 12/27/2016 Oral Phase WFL Oral -  Pudding Teaspoon -- Oral - Pudding Cup -- Oral - Honey Teaspoon -- Oral - Honey Cup -- Oral - Nectar Teaspoon -- Oral - Nectar Cup -- Oral - Nectar Straw -- Oral - Thin Teaspoon -- Oral - Thin Cup -- Oral - Thin Straw -- Oral - Puree -- Oral - Mech Soft -- Oral - Regular -- Oral - Multi-Consistency -- Oral - Pill -- Oral Phase - Comment --  CHL IP PHARYNGEAL PHASE 12/27/2016 Pharyngeal Phase Impaired Pharyngeal- Pudding Teaspoon -- Pharyngeal -- Pharyngeal- Pudding Cup -- Pharyngeal -- Pharyngeal- Honey Teaspoon Delayed swallow initiation-vallecula;Pharyngeal residue - valleculae;Pharyngeal residue - pyriform;Reduced tongue base retraction;Reduced laryngeal elevation;Penetration/Apiration after swallow Pharyngeal Material enters airway, passes BELOW cords without attempt by patient to eject out (silent aspiration);Material enters airway, remains ABOVE vocal cords and not ejected out Pharyngeal- Honey Cup -- Pharyngeal -- Pharyngeal- Nectar Teaspoon Delayed swallow initiation-vallecula;Penetration/Aspiration during swallow;Pharyngeal residue - valleculae;Pharyngeal residue - pyriform;Reduced tongue base retraction;Reduced laryngeal elevation Pharyngeal Material enters  airway, passes BELOW cords without attempt by patient to eject out (silent aspiration) Pharyngeal- Nectar Cup -- Pharyngeal -- Pharyngeal- Nectar Straw -- Pharyngeal -- Pharyngeal- Thin Teaspoon -- Pharyngeal -- Pharyngeal- Thin Cup -- Pharyngeal -- Pharyngeal- Thin Straw -- Pharyngeal -- Pharyngeal- Puree Delayed swallow initiation-vallecula Pharyngeal -- Pharyngeal- Mechanical Soft -- Pharyngeal -- Pharyngeal- Regular -- Pharyngeal -- Pharyngeal- Multi-consistency -- Pharyngeal -- Pharyngeal- Pill -- Pharyngeal -- Pharyngeal Comment --  CHL IP CERVICAL ESOPHAGEAL PHASE 12/27/2016 Cervical Esophageal Phase WFL Pudding Teaspoon -- Pudding Cup -- Honey Teaspoon -- Honey Cup -- Nectar Teaspoon -- Nectar Cup -- Nectar Straw -- Thin Teaspoon -- Thin Cup -- Thin Straw -- Puree -- Mechanical Soft -- Regular -- Multi-consistency -- Pill -- Cervical Esophageal Comment -- No flowsheet data found. Houston Siren 12/27/2016, 10:44 AM Orbie Pyo Colvin Caroli.Ed CCC-SLP Pager [phone]                Medications:  Scheduled: . aspirin  325 mg Oral Daily  . chlorhexidine gluconate (MEDLINE KIT)  15 mL Mouth Rinse BID  . clonazePAM  1 mg Oral TID  . darunavir-cobicistat  1 tablet Oral Q breakfast  . dolutegravir  50 mg Oral Daily  . emtricitabine-tenofovir AF  1 tablet Oral Daily  . fentaNYL  25 mcg Transdermal Q72H  . fluconazole  200 mg Oral Daily  . free water  200 mL Per Tube Q6H  . gabapentin  600 mg Oral TID  . heparin  5,000 Units Subcutaneous Q8H  . insulin aspart  0-15 Units Subcutaneous Q4H  . insulin glargine  15 Units Subcutaneous BID  . lactulose  10 g Oral BID  . mouth rinse  15 mL Mouth Rinse QID  . meropenem (MERREM) IV  2 g Intravenous Q8H  . pantoprazole sodium  40 mg Oral Daily  . scopolamine  1 patch Transdermal Q72H  . sulfamethoxazole-trimethoprim  20 mL Oral Daily  . vancomycin  1,250 mg Intravenous Q8H   Continuous: . dextrose 5 % and 0.9 % NaCl with KCl 20 mEq/L 75 mL/hr at  12/28/16 0512  . feeding supplement (JEVITY 1.2 CAL) Stopped (12/24/16 1832)   DTO:[id number], chlorproMAZINE (THORAZINE) IV, docusate, guaiFENesin-dextromethorphan, haloperidol lactate, hydrALAZINE, oxyCODONE, RESOURCE THICKENUP CLEAR, sennosides      DVT Prophylaxis: Subcutaneous heparin    Code Status: Full code  Family Communication: No family at bedside  Disposition Plan: not stable for discharge     LOS: 28 days   [facility] Pager [phone]  12/29/2016, 9:04 AM  If 7PM-7AM, please contact night-coverage at [email or web address], password [facility]

## 2016-12-29 NOTE — Progress Notes (Signed)
Patient refused blood draw for lab work this morning. Provider notified.

## 2016-12-29 NOTE — Progress Notes (Signed)
Physical Therapy Treatment Patient Details Name: Lawrence Kane MRN: [id number] DOB: 1966-06-10 Today's Date: 12/29/2016    History of Present Illness 51 yo [facility] on [facility]24 with AMS and intubated. 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 Pt with bil LE flaccid since admission. PMHx:HIV/AIDS, Hep C, Hep B, COPD, cocaine use     PT Comments    Pt remains weak but able to tolerate x 10 min sitting edge of bed.  Emphasis placed on core stability, head control and trunk control.  Will contine to recommend rehab at SNF when patient is ready to d/c.  Plan next session to work on sitting balance.     Follow Up Recommendations  SNF;Supervision/Assistance - 24 hour     Equipment Recommendations  Wheelchair (measurements PT);Wheelchair cushion (measurements PT);Hospital bed    Recommendations for Other Services       Precautions / Restrictions Precautions Precautions: Fall Precaution Comments: Trach collar, and Bil LEs flaccid Restrictions Weight Bearing Restrictions: No    Mobility  Bed Mobility Overal bed mobility: Needs Assistance Bed Mobility: Supine to Sit;Sit to Supine     Supine to sit: Max assist;+2 for physical assistance Sit to supine: Max assist;+2 for physical assistance   General bed mobility comments: Pt remains to require max assist +2 to advance LEs off the bed in prep for sitting edge of bed.  Pt requires increased time and assist to elevate trunk into sitting.  Sitting edge of bed x 10 min patient requires cues for head control, trunk control, and head and trunk extension.  Pt with poor ability to maintain.  Patient getting agitated and asking to use RW and eat food.    Transfers Overall transfer level: Needs assistance (transfers are unsafe at this time. )                  Ambulation/Gait                 Stairs             Wheelchair Mobility    Modified Rankin (Stroke Patients Only)       Balance Overall balance assessment: Needs assistance Sitting-balance support: Bilateral upper extremity supported;Feet supported Sitting balance-Leahy Scale: Poor Sitting balance - Comments: x 10 min static sitting.  Pt able to extend trunk x2.  Required constant cueing for head and trunk control. Requires max to total assist to maintain sitting balance.   Postural control: Posterior lean;Right lateral lean;Left lateral lean   Standing balance-Leahy Scale:  (n/a)                      Cognition Arousal/Alertness: Awake/alert Behavior During Therapy: WFL for tasks assessed/performed Overall Cognitive Status: Difficult to assess                 General Comments: Pt able to follow one step commands and actively participates with mobility. Pt unable to verbalize due to trach, but does nod head in response to questions.  Pt also able to mouth what he wants.      Exercises      General Comments        Pertinent Vitals/Pain Pain Assessment: No/denies pain Faces Pain Scale: Hurts little more Pain Location: generalized pain Pain Descriptors / Indicators: Discomfort Pain Intervention(s): Monitored during session    Home Living  Prior Function            PT Goals (current goals can now be found in the care plan section) Acute Rehab PT Goals Patient Stated Goal: pt unable to state Potential to Achieve Goals: Fair Progress towards PT goals: Progressing toward goals (slowly)    Frequency    Min 3X/week      PT Plan Discharge plan needs to be updated    Co-evaluation             End of Session Equipment Utilized During Treatment:  (trach collar) Activity Tolerance: Patient limited by fatigue;Patient limited by lethargy Patient left: in bed;with call bell/phone within reach;with bed alarm set Nurse Communication: Mobility status (nurse in room  during tx.  )       Time: 4098-1191 PT Time Calculation (min) (ACUTE ONLY): 23 min  Charges:  $Therapeutic Activity: 23-37 mins                    G Codes:       Florestine Avers 2017/01/19, 11:35 AM Joycelyn Rua, PTA pager [phone]

## 2016-12-29 NOTE — Progress Notes (Signed)
Pt pulled out his [name] out. He pulled his IV out. He pulled his tele off. Primary RN requested team assistance in paging MD/NP.  NP was notified if pt can have Restraints ordered, wrist and abdominal restraints. Primary RN informed to continue to monitor.

## 2016-12-29 NOTE — Progress Notes (Signed)
Informed by NT patient had pulled his trach out. Entering the room, patient was sitting up right, holding his trach in his had, ties in the other. I put obturator in, respiratory was called. They are putting trach in.

## 2016-12-30 ENCOUNTER — Inpatient Hospital Stay (HOSPITAL_COMMUNITY): Payer: Medicaid Other

## 2016-12-30 DIAGNOSIS — Z794 Long term (current) use of insulin: Secondary | ICD-10-CM

## 2016-12-30 DIAGNOSIS — J449 Chronic obstructive pulmonary disease, unspecified: Secondary | ICD-10-CM

## 2016-12-30 DIAGNOSIS — E119 Type 2 diabetes mellitus without complications: Secondary | ICD-10-CM

## 2016-12-30 DIAGNOSIS — Z79891 Long term (current) use of opiate analgesic: Secondary | ICD-10-CM

## 2016-12-30 DIAGNOSIS — R4182 Altered mental status, unspecified: Secondary | ICD-10-CM

## 2016-12-30 DIAGNOSIS — F4323 Adjustment disorder with mixed anxiety and depressed mood: Secondary | ICD-10-CM | POA: Diagnosis present

## 2016-12-30 DIAGNOSIS — F17210 Nicotine dependence, cigarettes, uncomplicated: Secondary | ICD-10-CM

## 2016-12-30 LAB — GLUCOSE, CAPILLARY
GLUCOSE-CAPILLARY: 204 mg/dL — AB (ref 65–99)
GLUCOSE-CAPILLARY: 140 mg/dL — AB (ref 65–99)
Glucose-Capillary: 111 mg/dL — ABNORMAL HIGH (ref 65–99)
Glucose-Capillary: 153 mg/dL — ABNORMAL HIGH (ref 65–99)
Glucose-Capillary: 592 mg/dL (ref 65–99)

## 2016-12-30 LAB — BASIC METABOLIC PANEL
Anion gap: 9 (ref 5–15)
BUN: 34 mg/dL — ABNORMAL HIGH (ref 6–20)
CO2: 23 mmol/L (ref 22–32)
Calcium: 8.8 mg/dL — ABNORMAL LOW (ref 8.9–10.3)
Chloride: 102 mmol/L (ref 101–111)
Creatinine, Ser: 1.25 mg/dL — ABNORMAL HIGH (ref 0.61–1.24)
GFR calc Af Amer: >60 mL/min (ref >60)
GFR calc non Af Amer: >60 mL/min (ref >60)
Glucose, Bld: 319 mg/dL — ABNORMAL HIGH (ref 65–99)
Potassium: 4.8 mmol/L (ref 3.5–5.1)
Sodium: 134 mmol/L — ABNORMAL LOW (ref 135–145)

## 2016-12-30 MED ORDER — INSULIN ASPART 100 UNIT/ML ~~LOC~~ SOLN
5.0000 [IU] | Freq: Once | SUBCUTANEOUS | Status: AC
  Administered 2016-12-30: 5 [IU] via SUBCUTANEOUS

## 2016-12-30 MED ORDER — INSULIN GLARGINE 100 UNIT/ML ~~LOC~~ SOLN: 3.0000 [IU] | Freq: Once | SUBCUTANEOUS | Status: DC

## 2016-12-30 MED ORDER — VANCOMYCIN HCL 10 G IV SOLR
1250.0000 mg | Freq: Two times a day (BID) | INTRAVENOUS | Status: AC
  Administered 2016-12-30: 1250 mg via INTRAVENOUS
  Filled 2016-12-30: qty 1250

## 2016-12-30 MED ORDER — VANCOMYCIN HCL 10 G IV SOLR
1250.0000 mg | Freq: Two times a day (BID) | INTRAVENOUS | Status: DC
  Filled 2016-12-30: qty 1250

## 2016-12-30 MED ORDER — SODIUM CHLORIDE 0.9 % IV SOLN
2.0000 g | Freq: Two times a day (BID) | INTRAVENOUS | Status: DC
  Filled 2016-12-30: qty 2

## 2016-12-30 MED ORDER — INSULIN GLARGINE 100 UNIT/ML ~~LOC~~ SOLN
18.0000 [IU] | Freq: Two times a day (BID) | SUBCUTANEOUS | Status: DC
  Administered 2016-12-30 – 2017-01-03 (×8): 18 [IU] via SUBCUTANEOUS
  Filled 2016-12-30 (×13): qty 0.18

## 2016-12-30 MED ORDER — SODIUM CHLORIDE 0.9 % IV SOLN
2.0000 g | Freq: Two times a day (BID) | INTRAVENOUS | Status: AC
  Administered 2016-12-30: 2 g via INTRAVENOUS
  Filled 2016-12-30: qty 2

## 2016-12-30 MED ORDER — SODIUM CHLORIDE 0.9 % IV SOLN: 2.0000 g | Freq: Two times a day (BID) | INTRAVENOUS | Status: DC

## 2016-12-30 NOTE — Progress Notes (Signed)
Pharmacy Antibiotic Note Lawrence Kane is a 51 y.o. male admitted on 12/01/2016 with previous treatment of ESBL K pneumoniae on trach aspirate, possible meningitis, positive HSV2 and a groin maceration. Pt is currently on Meropenem, Vancomycin and fluconazole. Noted plan stop date of 2/22.  SCr increased to 1.25 < 0.61 in last 48 hours. UOP appears stable.   Plan 1. Adjust vancomycin to 1250 mg every 12 hours with stop date this afternoon   2. Adjust meropenem to 2 gram IV every 12 hours with stop date later this evening 3. Pharmacy to sign off with noted stop dates on meropenem and vancomycin  4. Need determine planned length of therapy with Fluconazole    Height: _0  (193 cm) Weight: 198 lb 10.2 oz (90.1 kg) IBW/kg (Calculated) : 86.8  Temp (24hrs), Avg:98.3 F (36.8 C), Min:98.1 F (36.7 C), Max:98.5 F (36.9 C)   Recent Labs Lab 12/24/16 0402 12/25/16 0605 12/26/16 0622 12/27/16 0511 12/30/16 0046  WBC 9.4  --   --  7.6  --   CREATININE  --  0.57* 0.60* 0.61 1.25*    Estimated Creatinine Clearance: 85.8 mL/min (by C-G formula based on SCr of 1.25 mg/dL (H)).    Allergies  Allergen Reactions  . Iodine   . Ketorolac     unknown  . Tylenol [Acetaminophen]     unknown    Antimicrobials this admission: Ceftriaxone 1/24 (started at OSH) >> 1/30 Ampicillin 1/25 >>1/27 Acyclovir 1/25 >>1/26; 1/27>>(2/16) Bactrim 1/25 (PCP prophylaxis) >> 1/29  2/6 >> Ciprofloxacin 2/1 >>2/2 PCN 2/1 >>2/2 Fluconazole 2/17 >> Vancomycin 1/24 (started at OSH) >> 1/30; 2/2 >>2/5; 2/15 >>2/22 Meropenem 1/30 >> 2/1; 2/2 >>2/6; 2/15 >>2/22   Microbiology results: 1/22 scrotal wound MRSA 1/24 BCx x 2 [name]): 1/24 HSV PCR [name]): HSV2 POS 1/24 Cryptococcal [name]): 1/24 CSF [name]): gram stain no organism  1/24 Trach asp: ngtd 1/24 MRSA PCR: positive 1/25 BCx x2: ngtd 1/25 cryptococcal: negative 1/25 CSF culture (post abx): ngtd 1/25 CSF fungal Cx: neg 1/25 CSF  AFB: neg 1/25 Toxoplasma: neg 1/27 stronglyoids ab: negative 1/28 fungus from bronch: none observed 1/28 AFB bronch: neg 1/28 PCP bronch: neg 1/28 BAL: 40,000 klebsiella pneumonia ESBL. No parasites 1/29 CSF: ngtd 1/31 BCx: ng<12h 2/1 RPR: Neg CMV PCR - negative  Thank you for allowing pharmacy to be a part of this patient's care.  Vincenza Hews, PharmD, BCPS 12/30/2016, 8:28 AM

## 2016-12-30 NOTE — Progress Notes (Signed)
Spoke with Lawrence Kane (mom) per Lawrence's request. Mom stated she would not be able to get here until the weekend, if that is even possible as she does not drive and her son is the only one who can drive her. This nurse spoke with the mom with regards to refusing labs, refusing medications and being agitated and being in restraints trying to pull out trach, her response was " is that serious" Lawrence Kane requested that if Lawrence Kane could call her that would be the best method, at her home number.

## 2016-12-30 NOTE — Progress Notes (Signed)
Basic Metabolic Panel results came back with glucose of 319. Schorr,NP notified of results by RN. Schorr, NP placed one time order for 5 units of novolog. Will continue to monitor and treat per MD orders.

## 2016-12-30 NOTE — Progress Notes (Signed)
[facility] PROGRESS NOTE  Lawrence Kane QJJ:[id number] DOB: 13-Sep-1966 DOA: 12/01/2016  PCP: No primary care provider on file.  Brief History/Interval Summary:Lawrence Kane is a 51 y.o. male with history of COPD, HIV, substance abuse, noncompliance, recent surgery in [place];  who was admitted via [facility] on 12/01/16 with AMS changes and septic shock due to bacterial meningitis. He was intubated for airway protection and started on broad spectrum antibiotics as well as acyclovir for scrotal lesions. LP with high protein, low glucose and high WBC with predominance of eosinophils. ID consulted and questioned parasitic infection. Repeat LP 1/29 showed elevated glucose, elevated WBC with 28% eosinophils. BAL cytology/wet mount negative for malignant cells or parasites. MRI brain done 1/27 due to ongoing decrease in level of responsiveness, decrease in movement of BUE and flaccid BLE. MRI brain reviewed, showing small infarcts in cerebellum and cortex.  ?due to vasospasms or central/embolic disease and generalized sinuitis/mastoidits. Neurology questioned HSV vasculopathy and recommended MRA brain for work up. MRA brain revealed irregular R-ICA no large vessel stenosis and beaded appearance of right cervical ICA compatible with fibromuscular dysplasia.  Infarcts felt to be due to vasculitis/vasospams secondary to meningoencephalitis. MRI total spine was negative for abscess or hematoma, generalized meningitis and radiculitis and multifocal dorsal thoracic cord signal abnormality from myelitis. 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   He has had loose stools due to partial SBO v/s ileus. Agitation improving. He has completed treatment for  ESBL K-pneumoniae in BAL.   He was unable to tolerate vent wean requiring tracheostomy 2/5. He has been weaned to ATC for past 2 days but continues to have  copious purulent secretions via trach as well as restraints due to poor safety. Therapy ongoing and CIR recommended for follow up therapy.     . Subsequently, patient developed an ileus. NG tube was placed to intermittent suction.  Patient has pulled out his NG tube multiple times this admission. Still unable to swallow. Speech therapy consultation is pending     Assessment/Plan: Acute Hypoxemic Respiratory Failure , Tracheostomy 2/5 This was secondary to HCAP ESBL Klebsiella LLL+ sepsis + Unable to protect airway + pulm edema. Patient was on the ventilator for a prolonged duration. Subsequently underwent tracheostomy placement on 2/5. Pulmonology is following. Patient is off of the Precedex infusion.    Noted to have copious mucopurulent secretions, despite receiving   multiple courses of antibiotics between 1/24-2/7.  Chest x-ray 2/15 showed worsening bronchopneumonia.  patient restarted on meropenem and vancomycin 2/15 ,continue through 2/22. Appreciate PCCM recommendations,Down sized  to # 6 cuffless 2/20.  Will initiate capping trials; ideally  would like him more mobile or at least show me stronger phonation prior to decannulation.  Pulmonary to make further recommendations prior to discharge. Started on a diet, Speech therapy recommends - Dysphagia 1 (Puree) solids;Pudding thick liquid  pulled  out tracheostomy 2/20, it was replaced by RT Discussed with PCCM to see if they could de cannulate him as patient would likely pull it out anyway's ,once he is out of restraints     Hypoglycemia/hyperglycemia CBGs elevated due to noncompliance Increase Lantus to 18 units twice a day   Dysarthria,  ruled out for acute CVA, MRI of the brain   was negative     Scrotal wound/history of genital warts Left scrotal area wound is deeper with odor and  the head of the penis has wart like growths that area much worse than at the time of admission Started on fluconazole IV Periwound: various wart like  lesion, patient with history of genital warts known at the time of admission Awaiting records from outside facility, details of what kind of surgery he had Appreciate urology input-[facility] s/p local excision of scrotal condyloma by Urologist in [place] local wound care  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 HSV positive on CSF from [facility]. Infectious disease has been following. Treated with acyclovir.  for total of 21 days. Completed treatment on 2/17.  Continue Bactrim for PCP prophylaxis.  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 Patient is on acyclovir. High-dose steroids 1000 mg/day , given for 5 days. Now on low-dose steroid. This will need to be slowly tapered down. Neurology has seen the patient. On fentanyl patch for pain issues. Started during this hospitalization.Re-image spine in 2-3 weeks to assess for improvement. Neurology signed off 2/12.  Ileus Improved. Patient had NG tube placed 2/8. Patient has removed his NG several times.     DM, Hyperglycemia  HbA1c 6.6 on 1/26. Continue to adjust insulin based on by mouth intake     Bradycardia Heart rate has improved. Bradycardia was most likely due to medication such as versed. TSH is normal. Continue to monitor.   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 On aspirin. Echocardiogram shows normal systolic function. LDL 93. PT and OT evaluation. No significant carotid stenosis noted on MRA neck.  he was seen by neurology. They had mentioned that a TEE could be considered once the patient was more stable. This can be pursued at a later date, although embolic stroke appeared to be less of a possibility based on his clinical scenario. Repeat MRI negative for acute CVA  Pulmonary edema Seems to have improved. Continue intermittent  IV Lasix,    Moderate to Severe Protein Calorie Malnutrition  We  l resumed oral feeding  on 2/19    Hypernatremia Free water to continue. Increase to every 6 hours. Repeat labs tomorrow. Tube feedings on hold. Sodium level should come down.  Transaminitis Worsened by seroquel likely which was discontinued on 1/31. Improved.   HCV ab+, Chronic hep b Management will be pursued as outpatient.  Normocytic anemia Hemoglobin stable. No evidence for overt bleeding. Continue to monitor.  LLL PNA with ESBL Klebsiella  Restarted antibiotics 2/15, stop 2/22  HIV On antiretroviral treatment. Infectious disease was following.  Metabolic Encephalopathy Continue to monitor. Seems to be stable. PRN Sedative agents for agitation. Restraints in place to prevent him from pulling out supportive tubes.  History of Cocaine Abuse Will need counseling when able to communicate effectively  Disposition-psych consult to determine capacity Anticipate discharge to SNF Monday   Consultants: Infectious disease. Critical care medicine. Neurology. Psychiatry  STUDIES:  1/24 CXR >> ATX vs LLL infiltrate 1/25 CT head >> neg for acute intracranial process 1/25 LP cytology >> no malignant cells 1/25 LP >> yellow, cloudy, glucose 20, total protein >600, RBC 114>49, WBC 970, 36% neutrophil, 20% lymphs, 19% eos 1/28 BAL cell count >> 266 WBC, eos 1 1/28 BAL cytology >> no malignant cells 1/27 BAL for wet mount> neg for parasites 1/27 MRI brain >> small acute infarcts in cerebellum and cerebral cortex, sucal signal 1/29 LP >>  Glucose 82, protein >600, WBC 600>885, eosinophils 28% 1/29 Echo >> LV EF 60-65%, indeterminent diastolic function, wall motion normal 1/30 EEG >> diffuse slowing and background suppression, no seizures 2/3 MRI thoracic/lumbar/cervical >> (-) abscess. Meningitis. R/O CMV   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 1/24 Cryptococcal  [name]): negative 1/24 CSF [facility]): NGTD 1/22 scrotal wound cx [name]): MRSA Toxoplasma [name]): Negative 1/25BCx x2 : NGTD 1/25 HCV ab + 1/24 Trach asp 1/24: NGTD 1/24MRSA PCR: MRSA pos 1/25 HIV quant: 80, CD4 10 1/25 CSF AFB: Smear neg, >> 1/25 CSF fungal cx: NGTD 1/25 CSF crypto ag: neg 1/25 CSF gram stain no organisms, cx: NGTD 1/27 strongyloides ab >> negative 1/27 coccidiomycosis ab >> 1/28 BAL AFB smear/cx> 1/28 BAL Fungus cx >> 1/28 BAL gram stain/Cx: 40K ESBL Klebsiella pneumoniae 1/28 BAL pneumocystis smear: Neg 1/27 O&P >> 1/29 CSF Cx >> no organisms on gram stain, >> NGTD 2/04 CSF CMV >> not done?   ANTIBIOTICS: Rocephin 1/24>>1/30 Vancomycin 1/24>>1/30; 2/2 >  Acyclovir 1/24>> 1/26, 1/27>> Ampicillin 1/24>> 1/27 Bactrim M/W/F for PCP prophylaxis Meropenem 1/30>> 2/1; 2/2 >  Pen G 2/1 >> 2/2 Cipro 2/1 >> 2/2 Meropenem/vancomycin-2/15-  SIGNIFICANT EVENTS: 1/24  Transferred from [facility] 1/25  Underwent LP here 2/05  Trach 2/06  To ATC 2/8  patient developed ileus  LINES/TUBES: L IJ CVL 1/24 >> ETT 1/24 >> 2/5 Foley 1/24 >> OGT 1/24 >> 2/5 Trach (df) 2/5 >>   Subjective/Interval H istory:  pulled trach out. Lurline Idol was placed back in by RT (6 Shiley cuffless) Patient continues to refuse labs  ROS: Unable to do.  Objective:  Vital Signs  Vitals:   12/30/16 0515 12/30/16 0614 12/30/16 0627 12/30/16 0808  BP: (!) 156/100 (!) 157/102 (!) 137/103   Pulse: 76 67 69 89  Resp: 18   18  Temp: 98.1 F (36.7 C)     TempSrc: Oral     SpO2: 98%   97%  Weight:      Height:        Intake/Output Summary (Last 24 hours) at 12/30/16 0940 Last data filed at 12/30/16 5397  Gross per 24 hour  Intake          2393.75 ml  Output             1500 ml  Net           893.75 ml   Filed Weights   12/28/16 0443 12/29/16 0608 12/30/16 0419  Weight: 90.4 kg (199 lb 6.4 oz) 93.4 kg (205 lb 14.6 oz) 90.1 kg (198 lb 10.2 oz)    General  appearance: Patient is awake and alert. Neck: Tracheostomy noted Resp: Good air entry bilaterally. No wheezing, rales or rhonchi. diminished at the bases.  Cardio: S1, S2, is bradycardic, regular. No S3, S4. No rubs, murmurs, or bruit. Minimal pedal edema.  GI: Abdomen remains soft. Not distended. Bowel sounds are present. No masses or organomegaly. Nontender.  Extremities: Minimal edema noted bilateral lower extremities Pulses: 2+ and symmetric Neurologic: Patient is awake and alert. Moving his upper extremities. No movement noted in the lower extremities. : Large open wound on left lateral portion of scrotum with purulent discharge. Foley in place.   Lab Results:  Data Reviewed: I have personally reviewed following labs and imaging studies  CBC:  Recent Labs Lab 12/24/16 0402 12/27/16 0511  WBC 9.4 7.6  HGB 11.0* 12.1*  HCT 34.9* 38.0*  MCV 95.9  94.8  PLT 362 953    Basic Metabolic Panel:  Recent Labs Lab 12/25/16 0605 12/26/16 0622 12/27/16 0511 12/30/16 0046  NA 139 137 136 134*  K 3.7 3.2* 4.0 4.8  CL 107 106 103 102  CO2 _0 GLUCOSE 119* 80 153* 319*  BUN 23* 22* 15 34*  CREATININE 0.57* 0.60* 0.61 1.25*  CALCIUM 8.7* 8.5* 8.3* 8.8*    GFR: Estimated Creatinine Clearance: 85.8 mL/min (by C-G formula based on SCr of 1.25 mg/dL (H)).  Liver Function Tests:  Recent Labs Lab 12/26/16 0622  AST 55*  ALT 83*  ALKPHOS 62  BILITOT 1.1  PROT 6.9  ALBUMIN 2.0*     Coagulation Profile: No results for input(s): INR, PROTIME in the last 168 hours.  CBG:  Recent Labs Lab 12/29/16 2003 12/29/16 2226 12/30/16 0010 12/30/16 0406 12/30/16 0804  GLUCAP 296* 307* 592* 204* 111*     No results found for this or any previous visit (from the past 240 hour(s)).    Radiology Studies: No results found.   Medications:  Scheduled: . aspirin  325 mg Oral Daily  . chlorhexidine gluconate (MEDLINE KIT)  15 mL Mouth Rinse BID  . clonazePAM  1 mg  Oral TID  . darunavir-cobicistat  1 tablet Oral Q breakfast  . dolutegravir  50 mg Oral Daily  . emtricitabine-tenofovir AF  1 tablet Oral Daily  . fluconazole  200 mg Oral Daily  . gabapentin  600 mg Oral TID  . heparin  5,000 Units Subcutaneous Q8H  . insulin aspart  0-15 Units Subcutaneous Q4H  . insulin glargine  15 Units Subcutaneous BID  . lactulose  10 g Oral BID  . mouth rinse  15 mL Mouth Rinse QID  . meropenem (MERREM) IV  2 g Intravenous Q12H  . OLANZapine  5 mg Oral QHS  . pantoprazole sodium  40 mg Oral Daily  . scopolamine  1 patch Transdermal Q72H  . sulfamethoxazole-trimethoprim  20 mL Oral Daily  . vancomycin  1,250 mg Intravenous Q12H   Continuous: . sodium chloride 75 mL/hr at 12/29/16 2239  . feeding supplement (JEVITY 1.2 CAL) Stopped (12/24/16 1832)   XYD:[id number], chlorproMAZINE (THORAZINE) IV, docusate, guaiFENesin-dextromethorphan, haloperidol lactate, hydrALAZINE, oxyCODONE, RESOURCE THICKENUP CLEAR, sennosides      DVT Prophylaxis: Subcutaneous heparin    Code Status: Full code  Family Communication: No family at bedside  Disposition Plan: not stable for discharge     LOS: 29 days   [facility] Pager [phone] 12/30/2016, 9:40 AM  If 7PM-7AM, please contact night-coverage at [email or web address], password [facility]

## 2016-12-30 NOTE — Consult Note (Signed)
[facility] Face-to-Face Psychiatry Consult   Reason for Consult:  Capacity evaluation Referring Physician:  Dr. Allyson Sabal Patient Identification: Lawrence Kane MRN:  [id number] Principal Diagnosis: Adjustment disorder with mixed anxiety and depressed mood Diagnosis:   Patient Active Problem List   Diagnosis Date Noted  . Goals of care, counseling/discussion [Z71.89]   . Palliative care encounter [Z51.5]   . Respiratory distress [R06.00]   . Agitation [R45.1]   . Hypernatremia [E87.0]   . Leukocytosis [D72.829]   . Acute blood loss anemia [D62]   . Pain [R52]   . Acute respiratory failure with hypoxia ([place]) [J96.01]   . Tracheostomy status ([place]) [Z93.0]   . Hypoxemia [R09.02]   . Muscle weakness (generalized) [M62.81]   . Ileus ([place]) [K56.7]   . Abdominal distention [R14.0]   . Transverse myelitis ([place]) [G37.3]   . Encounter for orogastric (OG) tube placement [Z46.59]   . Somnolence [R40.0]   . Paraplegia ([place]) [G82.20]   . Acute pulmonary edema (HCC) [J81.0]   . 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 Cerebral embolism with cerebral infarction [I63.40] 12/08/2016  . Endotracheally intubated [Z97.8]   . 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 Acute encephalopathy [G93.40]   . Acute respiratory failure ([place]) [J96.00] 12/02/2016  . Altered mental status [R41.82]   . COPD (chronic obstructive pulmonary disease) ([place]) [J44.9] 12/01/2016  . Substance abuse [F19.10] 12/01/2016  . Genital warts [A63.0] 12/01/2016  . HIV (human immunodeficiency virus infection) ([place]) [B20] 12/01/2016  . AIDS (acquired immune deficiency syndrome) ([place]) [B20] 12/01/2016  . Noncompliance [Z91.19] 12/01/2016  . Bacterial meningitis [G00.9] 12/01/2016  . Hypertensive urgency [I16.0] 12/01/2016  . Septic shock ([place]) [A41.9, R65.21] 12/01/2016  . Transaminitis [R74.0] 12/01/2016  . Cocaine abuse [F14.10] 12/01/2016  . Open wound [T14.8XXA] 12/01/2016    Total Time spent with patient: 45  minutes  Subjective:   Lawrence Kane is a 51 y.o. male patient admitted with Altered mental status.  HPI:  Lawrence Kane a 51 y.o.malewith history of COPD, HIV, substance abuse, noncompliance, recent surgery in [place]; who was admitted via [place] on 12/01/16 with AMS changes and septic shock due to bacterial meningitis. Patient seen, chart reviewed and case discussed with the staff RN who is at bedside for this psychiatric consultation and evaluation of capacity to make his own medical condition, patient appeared lying in his bed with poor hygiene but no altered mental status which seems to be resolved with his treatment while in the hospital. Reportedly patient has been noncompliant with treatment, getting agitated and pulling his tracheostomy tube more frequently and demanding to be discharged. Patient has been whispering throughout this evaluation and occasionally difficult to understand his communication, patient refused to write down and the sheet paper and pen given to him for alternative communication. Patient is awake, alert, oriented to time place person and situation. Patient has multiple complaints about not having the solid food. And patient was informed staff has to follow the nutritionist order patient does not agree with. Patient also reported he would like to be discharged back to his mother's care. Patient was informed he may not able to go home he may need to go to the skilled nursing facility with rehabilitation services patient is open to lesion from the case manager/social service. She denied suicidal/homicidal ideation and has no evidence of psychotic symptoms, does not appear to be responding to internal stimuli.    Medical history: Lawrence Kane a 51 y.o.malewith history of  COPD, HIV, substance abuse, noncompliance, recent surgery in [place]; who was admitted via [facility] on 12/01/16 with AMS changes and septic shock due to bacterial meningitis. He was  intubated for airway protection and started on broad spectrum antibiotics as well as acyclovir for scrotal lesions, then later he required tracheostomy placement. CIR recommended for follow up therapy. Subsequently, patient developed an ileus. NG tube was placed to intermittent suction.  Patient has pulled out his NG tube multiple times this admission. Still unable to swallow.   Past Psychiatric History: None documented.  Risk to Self: Is patient at risk for suicide?: No Risk to Others:   Prior Inpatient Therapy:   Prior Outpatient Therapy:    Past Medical History:  Past Medical History:  Diagnosis Date  . COPD (chronic obstructive pulmonary disease) ([place])   . Genital warts   . HIV (human immunodeficiency virus infection) ([place])   . Osteoporosis     Past Surgical History:  Procedure Laterality Date  . PROSTATE SURGERY  11/17/2016   Family History: History reviewed. No pertinent family history. Family Psychiatric  History: Noncontributory  Social History:  History  Alcohol Use  . 0.6 oz/week  . 1 Cans of beer per week     History  Drug Use No    Social History   Social History  . Marital status: Married    Spouse name: N/A  . Number of children: N/A  . Years of education: N/A   Social History Main Topics  . Smoking status: Current Every Day Smoker  . Smokeless tobacco: Never Used  . Alcohol use 0.6 oz/week    1 Cans of beer per week  . Drug use: No  . Sexual activity: Not Asked   Other Topics Concern  . None   Social History Narrative  . None   Additional Social History:    Allergies:   Allergies  Allergen Reactions  . Iodine   . Ketorolac     unknown  . Tylenol [Acetaminophen]     unknown    Labs:  Results for orders placed or performed during the hospital encounter of 12/01/16 (from the past 48 hour(s))  Glucose, capillary     Status: Abnormal   Collection Time: 12/28/16  4:59 PM  Result Value Ref Range   Glucose-Capillary 276 (H) 65 - 99 mg/dL   Glucose, capillary     Status: Abnormal   Collection Time: 12/28/16  7:53 PM  Result Value Ref Range   Glucose-Capillary 239 (H) 65 - 99 mg/dL  Glucose, capillary     Status: Abnormal   Collection Time: 12/29/16 12:09 AM  Result Value Ref Range   Glucose-Capillary 127 (H) 65 - 99 mg/dL  Glucose, capillary     Status: Abnormal   Collection Time: 12/29/16  4:24 AM  Result Value Ref Range   Glucose-Capillary 105 (H) 65 - 99 mg/dL  Glucose, capillary     Status: Abnormal   Collection Time: 12/29/16  8:24 AM  Result Value Ref Range   Glucose-Capillary 108 (H) 65 - 99 mg/dL  Glucose, capillary     Status: Abnormal   Collection Time: 12/29/16 12:18 PM  Result Value Ref Range   Glucose-Capillary 189 (H) 65 - 99 mg/dL  Glucose, capillary     Status: Abnormal   Collection Time: 12/29/16  4:26 PM  Result Value Ref Range   Glucose-Capillary 311 (H) 65 - 99 mg/dL  Glucose, capillary     Status: Abnormal   Collection Time: 12/29/16  8:03 PM  Result Value Ref Range   Glucose-Capillary 296 (H) 65 - 99 mg/dL  Glucose, capillary     Status: Abnormal   Collection Time: 12/29/16 10:26 PM  Result Value Ref Range   Glucose-Capillary 307 (H) 65 - 99 mg/dL  Glucose, capillary     Status: Abnormal   Collection Time: 12/30/16 12:10 AM  Result Value Ref Range   Glucose-Capillary 592 (HH) 65 - 99 mg/dL   Comment 1 Notify RN   Basic metabolic panel     Status: Abnormal   Collection Time: 12/30/16 12:46 AM  Result Value Ref Range   Sodium 134 (L) 135 - 145 mmol/L   Potassium 4.8 3.5 - 5.1 mmol/L   Chloride 102 101 - 111 mmol/L   CO2 23 22 - 32 mmol/L   Glucose, Bld 319 (H) 65 - 99 mg/dL   BUN 34 (H) 6 - 20 mg/dL   Creatinine, Ser 1.25 (H) 0.61 - 1.24 mg/dL   Calcium 8.8 (L) 8.9 - 10.3 mg/dL   GFR calc non Af Amer >60 >60 mL/min   GFR calc Af Amer >60 >60 mL/min    Comment: (NOTE) The eGFR has been calculated using the CKD EPI equation. This calculation has not been validated in all clinical  situations. eGFR's persistently <60 mL/min signify possible Chronic Kidney Disease.    Anion gap 9 5 - 15  Glucose, capillary     Status: Abnormal   Collection Time: 12/30/16  4:06 AM  Result Value Ref Range   Glucose-Capillary 204 (H) 65 - 99 mg/dL  Glucose, capillary     Status: Abnormal   Collection Time: 12/30/16  8:04 AM  Result Value Ref Range   Glucose-Capillary 111 (H) 65 - 99 mg/dL   Comment 1 Notify RN    Comment 2 Document in Chart   Glucose, capillary     Status: Abnormal   Collection Time: 12/30/16 12:31 PM  Result Value Ref Range   Glucose-Capillary 140 (H) 65 - 99 mg/dL   Comment 1 Notify RN    Comment 2 Document in Chart     Current Facility-Administered Medications  Medication Dose Route Frequency Provider Last Rate Last Dose  . 0.9 %  sodium chloride infusion   Intravenous Continuous Rhetta Mura Schorr, NP 75 mL/hr at 12/29/16 2239    . acetaminophen (TYLENOL) tablet 650 mg  650 mg Oral Q4H PRN Velna Ochs, MD   650 mg at 12/12/16 2139  . aspirin tablet 325 mg  325 mg Oral Daily Reyne Dumas, MD   325 mg at 12/30/16 1032  . chlorhexidine gluconate (MEDLINE KIT) (PERIDEX) 0.12 % solution 15 mL  15 mL Mouth Rinse BID Laverle Hobby, MD   15 mL at 12/30/16 1057  . chlorproMAZINE (THORAZINE) 25 mg in sodium chloride 0.9 % 25 mL IVPB  25 mg Intravenous Q8H PRN Florinda Marker, MD   25 mg at 12/07/16 2121  . clonazePAM (KLONOPIN) tablet 1 mg  1 mg Oral TID Jannette Fogo, NP   1 mg at 12/30/16 1032  . darunavir-cobicistat (PREZCOBIX) 800-150 MG per tablet 1 tablet  1 tablet Oral Q breakfast Brand Males, MD   1 tablet at 12/30/16 1034  . docusate (COLACE) 50 MG/5ML liquid 100 mg  100 mg Oral BID PRN Reyne Dumas, MD      . dolutegravir (TIVICAY) tablet 50 mg  50 mg Oral Daily Velna Ochs, MD   50 mg at 12/30/16 1032  . emtricitabine-tenofovir AF (  DESCOVY) 200-25 MG per tablet 1 tablet  1 tablet Oral Daily Reyne Dumas, MD   1 tablet at 12/30/16  1032  . feeding supplement (JEVITY 1.2 CAL) liquid 1,000 mL  1,000 mL Per Tube Continuous Rush Farmer, MD   Stopped at 12/24/16 1832  . fluconazole (DIFLUCAN) tablet 200 mg  200 mg Oral Daily Reyne Dumas, MD   200 mg at 12/30/16 1032  . gabapentin (NEURONTIN) capsule 600 mg  600 mg Oral TID Jannette Fogo, NP   600 mg at 12/30/16 1032  . guaiFENesin-dextromethorphan (ROBITUSSIN DM) 100-10 MG/5ML syrup 5 mL  5 mL Oral Q4H PRN Reyne Dumas, MD      . haloperidol lactate (HALDOL) injection 5 mg  5 mg Intravenous Q6H PRN Raylene Miyamoto, MD   5 mg at 12/22/16 1430  . heparin injection 5,000 Units  5,000 Units Subcutaneous Q8H Velna Ochs, MD   5,000 Units at 12/30/16 0540  . hydrALAZINE (APRESOLINE) injection 10 mg  10 mg Intravenous Q6H PRN Praveen Mannam, MD   10 mg at 12/24/16 0424  . insulin aspart (novoLOG) injection 0-15 Units  0-15 Units Subcutaneous Q4H Bonnielee Haff, MD   5 Units at 12/30/16 0416  . insulin glargine (LANTUS) injection 18 Units  18 Units Subcutaneous BID Reyne Dumas, MD   18 Units at 12/30/16 1038  . lactulose (CHRONULAC) 10 GM/15ML solution 10 g  10 g Oral BID Reyne Dumas, MD   10 g at 12/30/16 1031  . MEDLINE mouth rinse  15 mL Mouth Rinse QID Laverle Hobby, MD   15 mL at 12/30/16 1034  . meropenem (MERREM) 2 g in sodium chloride 0.9 % 100 mL IVPB  2 g Intravenous Q12H Reyne Dumas, MD      . OLANZapine (ZYPREXA) tablet 5 mg  5 mg Oral QHS Jannette Fogo, NP   5 mg at 12/29/16 2235  . oxyCODONE (Oxy IR/ROXICODONE) immediate release tablet 5 mg  5 mg Oral Q4H PRN Jannette Fogo, NP   5 mg at 12/29/16 2017  . pantoprazole sodium (PROTONIX) 40 mg/20 mL oral suspension 40 mg  40 mg Oral Daily Reyne Dumas, MD   40 mg at 12/30/16 1032  . RESOURCE THICKENUP CLEAR   Oral PRN Reyne Dumas, MD      . scopolamine (TRANSDERM-SCOP) 1 MG/3DAYS 1.5 mg  1 patch Transdermal Q72H Reyne Dumas, MD   1.5 mg at 12/29/16 1257  . sennosides (SENOKOT) 8.8  MG/5ML syrup 5 mL  5 mL Oral BID PRN Reyne Dumas, MD      . sulfamethoxazole-trimethoprim (BACTRIM,SEPTRA) 200-40 MG/5ML suspension 20 mL  20 mL Oral Daily Reyne Dumas, MD   20 mL at 12/30/16 1036  . vancomycin (VANCOCIN) 1,250 mg in sodium chloride 0.9 % 250 mL IVPB  1,250 mg Intravenous Q12H Reyne Dumas, MD       Facility-Administered Medications Ordered in Other Encounters  Medication Dose Route Frequency Provider Last Rate Last Dose  . chlorhexidine gluconate (MEDLINE KIT) (PERIDEX) 0.12 % solution 15 mL  15 mL Mouth Rinse BID [place]uma, MD      . MEDLINE mouth rinse  15 mL Mouth Rinse 10 times per day Rush Landmark, MD        Musculoskeletal: Strength & Muscle Tone: decreased Gait & Station: unable to stand Patient leans: N/A  Psychiatric Specialty Exam: Physical Exam as per history and physical   ROS generalized weakness, bedridden cannot perform ADLs,  poor hygiene complaining about being hungry and not able to eat solid foods. Patient does not understand staff cannot provide the solid food has a problem with swallowing. Vision denied nausea, vomiting, abdomen pain, shortness of breath and chest pain.   Blood pressure (!) 137/103, pulse 90, temperature 98.1 F (36.7 C), temperature source Oral, resp. rate 18, height _0  (1.93 m), weight 90.1 kg (198 lb 10.2 oz), SpO2 98 %.Body mass index is 24.18 kg/m.  General Appearance: Disheveled and Guarded  Eye Contact:  Good  Speech:  Slow and Whispering and s/p tracheostomy  Volume:  Decreased  Mood:  Anxious and Depressed  Affect:  Constricted and Depressed  Thought Process:  Coherent and Goal Directed  Orientation:  Full (Time, Place, and Person)  Thought Content:  Rumination  Suicidal Thoughts:  No  Homicidal Thoughts:  No  Memory:  Immediate;   Fair Recent;   Fair Remote;   Fair  Judgement:  Impaired  Insight:  Shallow  Psychomotor Activity:  Decreased  Concentration:  Concentration: Fair and Attention  Span: Fair  Recall:  [employer] of Knowledge:  Fair  Language:  Good  Akathisia:  Negative  Handed:  Right  AIMS (if indicated):     Assets:  Communication Skills Desire for Improvement Resilience Social Support  ADL's:  Impaired  Cognition:  WNL  Sleep:        Treatment Plan Summary: 51 years old male with multiple medical problems admitted to the hospital with history of COPD, HIV, Bactrim and inject his and altered mental status. Patient has been receiving IV antibiotics, status post intubation/extubation and then status post tracheostomy having difficulty swallowing. Patient continued to have agitation and restlessness. Patient is demanding to be discharged out of the hospital and could not identify his self care needs.  Based on my evaluation patient does not meet criteria for capacity to make his own medical decisions and living arrangements as patient cannot identify his multiple medical problems and required treatments including self-care needs. Patient does not even understand staff cannot give solid food when he cannot swallow.  Appreciate psychiatric consultation and we sign off as of today Please contact [phone] or [phone] if needs further assistance   Disposition: Supportive therapy provided about ongoing stressors.  Ambrose Finland, MD 12/30/2016 1:32 PM

## 2016-12-30 NOTE — Progress Notes (Signed)
Modified Barium Swallow Progress Note  Patient Details  Name: Lawrence Kane MRN: [id number] Date of Birth: 1966-10-25  Today's Date: 12/30/2016  Modified Barium Swallow completed.  Full report located under Chart Review in the Imaging Section.  Brief recommendations include the following:  Clinical Impression  Swallow function mildy decreased from prior 2/19 with increased motor impairments observed. 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 [name] exhibited a mod-severe pharyngeal dysphagia characterized by both silent aspiration and penetration with nectar and honey thick liquids, which was present with several trials. Verbal cues to cough, throat clear, and second swallows did not effectively remove the aspirated/penetrated barium or residue. Delayed swallow initiation to valleculae and pyriform sinuses noted throughout the study due to decreased sensation. Significantly increased vallecular and pyriform sinus residue compared to prior MBS (12/27/16) due to reduced tongue base retraction and laryngeal elevation; impaired cervical esophageal relaxation resulted in barium stasis above and below the UES and slow transit (not observed during 2/19 MBS). Educated pt re: severity of swallow impairments and the high aspiration risk associated primarily with consistencies thinner than pudding thick. Pt was adamant about consuming a regular diet and thin liquids despite SLP education. Recommend Dys 1 (pureed solids), pudding thick liquids via spoon, and meds crushed. MD notified who stated she will speak with pt and pt's mother. ST will f/u.   Swallow Evaluation Recommendations       SLP Diet Recommendations: Pudding thick liquid;Dysphagia 1 (Puree) solids   Liquid Administration via: Spoon;No straw   Medication Administration: Crushed with puree   Supervision: Patient able to self feed;Full assist for feeding   Compensations: Slow rate;Small sips/bites   Postural  Changes: Seated upright at 90 degrees   Oral Care Recommendations: Oral care BID   Other Recommendations: Order thickener from pharmacy    Lawrence Kane, Lawrence Kane 12/30/2016,3:16 PM

## 2016-12-30 NOTE — Progress Notes (Signed)
Daily Progress Note   Patient Name: Lawrence Kane       Date: 12/30/2016 DOB: 1966/08/23  Age: 51 y.o. MRN#: [id number] Attending Physician: Reyne Dumas, MD Primary Care Physician: No primary care provider on file. Admit Date: 12/01/2016  Reason for Consultation/Follow-up: Non pain symptom management, Pain control and Psychosocial/spiritual support  Subjective: Lawrence Kane is alert and fully oriented in bed. He reported sleeping well overnight. He is very frustrated this morning regarding his diet. He wants to be able to eat a regular diet, with full allowance of soda and ensure. He is also frustrated that the hospital is not letting him leave. He wants to have someone call his mother and demand she come pick him up. He refuses to go to SNF/rehab after discharge, and wants to leave today or tomorrow regardless of his health state. He also continues to express the belief that he will die before the end of the week. Symptomatically, he continues to endorse hip, knee, and ankle pain. He has been appropriately asking for and using PRN Oxycodone, though does continue to ask me for IV dilaudid.  Length of Stay: 29  Current Medications: Scheduled Meds:  . aspirin  325 mg Oral Daily  . chlorhexidine gluconate (MEDLINE KIT)  15 mL Mouth Rinse BID  . clonazePAM  1 mg Oral TID  . darunavir-cobicistat  1 tablet Oral Q breakfast  . dolutegravir  50 mg Oral Daily  . emtricitabine-tenofovir AF  1 tablet Oral Daily  . fluconazole  200 mg Oral Daily  . gabapentin  600 mg Oral TID  . heparin  5,000 Units Subcutaneous Q8H  . insulin aspart  0-15 Units Subcutaneous Q4H  . insulin glargine  15 Units Subcutaneous BID  . lactulose  10 g Oral BID  . mouth rinse  15 mL Mouth Rinse QID  . meropenem (MERREM) IV  2 g Intravenous Q12H  . OLANZapine  5 mg Oral QHS  .  pantoprazole sodium  40 mg Oral Daily  . scopolamine  1 patch Transdermal Q72H  . sulfamethoxazole-trimethoprim  20 mL Oral Daily  . vancomycin  1,250 mg Intravenous Q12H    Continuous Infusions: . sodium chloride 75 mL/hr at 12/29/16 2239  . feeding supplement (JEVITY 1.2 CAL) Stopped (12/24/16 1832)    PRN Meds: acetaminophen, chlorproMAZINE (THORAZINE) IV, docusate, guaiFENesin-dextromethorphan, haloperidol lactate, hydrALAZINE, oxyCODONE, RESOURCE THICKENUP CLEAR, sennosides  Physical Exam  Constitutional: He is oriented to person, place, and time. He appears well-developed and well-nourished.  HENT:  Left ear with healing skin tear, healing lesions on lips. Tongue now with brown coating.   Eyes: EOM are normal.  Neck: Normal range of motion. Neck supple.  Trach present on room air  Cardiovascular: Normal rate.   Pulmonary/Chest: Effort normal. He has no wheezes. He has rhonchi (throughout).  Trach in place, capped. Thick secretions around trach site. Voice slightly more forceful today.   Abdominal: Soft. Bowel sounds are normal. He exhibits no distension. There is no tenderness.  Musculoskeletal:  Generalized weakness. Full movement of BUE. Conflicting report on movement of BLE. He says he can move them--but doesn't on request. States they will work better when he is allowed to go walking.  Neurological: He is alert and oriented to person, place, and time.  Orientation a little questionable. Answers all orientation questions, remembers my name and our conversations over the past few days. Does not seem to hear/understand rationale for medical interventions and restrictions.  Skin: Skin is warm and dry.  Known skin breakdown on scrotum, however he refused evaluation  Psychiatric: His affect is angry and blunt. His speech is slurred (whisper). He is agitated. He expresses impulsivity and inappropriate judgment. He exhibits abnormal recent memory.  Concern for delusional thinking.  Believes he can walk out of the hospital with a walker. States "i'm fine and getting out of this shit-hole."           Vital Signs: BP (!) 137/103 (BP Location: Right Arm)   Pulse 89   Temp 98.1 F (36.7 C) (Oral)   Resp 18   Ht _0  (1.93 m)   Wt 90.1 kg (198 lb 10.2 oz)   SpO2 97%   BMI 24.18 kg/m  SpO2: SpO2: 97 % O2 Device: O2 Device: Not Delivered O2 Flow Rate: O2 Flow Rate (L/min): 6 L/min  Intake/output summary:   Intake/Output Summary (Last 24 hours) at 12/30/16 0901 Last data filed at 12/30/16 8250  Gross per 24 hour  Intake          2393.75 ml  Output             1500 ml  Net           893.75 ml   LBM: Last BM Date: 12/28/16 Baseline Weight: Weight: 93 kg (205 lb) Most recent weight: Weight: 90.1 kg (198 lb 10.2 oz)       Palliative Assessment/Data: PPS 50%   Flowsheet Rows   Flowsheet Row Most Recent Value  Intake Tab  Referral Department  Hospitalist  Unit at Time of Referral  Med/Surg Unit  Palliative Care Primary Diagnosis  Sepsis/Infectious Disease  Date Notified  12/24/16  Palliative Care Type  New Palliative care  Reason for referral  Clarify Goals of Care  Date of Admission  12/01/16  Date first seen by Palliative Care  12/25/16  # of days Palliative referral response time  1 Day(s)  # of days IP prior to Palliative referral  23  Clinical Assessment  Palliative Performance Scale Score  30%  Psychosocial & Spiritual Assessment  Palliative Care Outcomes  Patient/Family meeting held?  Yes  Who was at the meeting?  patient and mother on the phone  Palliative Care Outcomes  Clarified goals of care      Patient Active Problem List   Diagnosis Date Noted  . Goals of care, counseling/discussion   . Palliative care encounter   . Respiratory distress   . Agitation   . Hypernatremia   . Leukocytosis   . Acute blood loss anemia   . Pain   . Acute respiratory failure with hypoxia ([place])   . Tracheostomy status ([place])   . Hypoxemia   . Muscle  weakness (generalized)   . Ileus ([place])   . Abdominal distention   . Transverse myelitis ([place])   . Encounter for orogastric (OG) tube placement   . Somnolence   . Paraplegia ([place])   . Acute pulmonary edema ([facility])   . Neurosyphilis   . Herpesviral meningitis   . Cerebral embolism with cerebral infarction 12/08/2016  . Endotracheally intubated   . Meningoencephalitis   . Acute encephalopathy   . Acute respiratory failure ([place]) 12/02/2016  . Altered mental status   .  COPD (chronic obstructive pulmonary disease) ([place]) 12/01/2016  . Substance abuse 12/01/2016  . Genital warts 12/01/2016  . HIV (human immunodeficiency virus infection) ([place]) 12/01/2016  . AIDS (acquired immune deficiency syndrome) ([place]) 12/01/2016  . Noncompliance 12/01/2016  . Bacterial meningitis 12/01/2016  . Hypertensive urgency 12/01/2016  . Septic shock ([place]) 12/01/2016  . Transaminitis 12/01/2016  . Cocaine abuse 12/01/2016  . Open wound 12/01/2016    Palliative Care Assessment & Plan   HPI: 51 y.o. male  with past medical history of HIV/AIDS, chronic Hep B, COPD, Osteoporosis, genital warts, and cocaine use who originally presented to [facility] with AMS and agitation. He was intubated for airway protection and transferred to [facility]. He was admitted here on 12/01/2016. 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 He was intubated for acute respiratory failure, and a tracheostomy was placed. He did develop an ileus, however repeatedly pulled out his NG tube. Swallow evaluation pending.   Assessment: I met Jadriel, his mother, brother, and sister-in-law at his bedside on 2/19. Please see my initial consult note for full details of that conversation. In brief, Faheem referred to his mother to make decisions about his code status, but was convinced he  would die prior to discharge.On 2/20, I called his mother to follow-up on our code status conversation. She did not feel she had adequate time to discuss these issues with Markavious, and asked to continue Full code status until she has a better chance to talk with her son. Urian continued to defer to his mother for decision making.   On 2/20: During my visit, Nikash continuously mentioned his uncontrolled pain and high anxiety. As I explored this further he related that he took percocet at home (despite stating a tylenol allergy), which was in addition to "smoking joints." I asked about the cocaine use, which he did acknowledge using recently. Prior to admission he has listed Percocet and Gabapentin as home medications. He could not recall what the gabapentin was for, and he reported the Percocet was for "body pain," but could be no more specific. He was also unable to tell me who prescribed the medication. I did a query of the controlled substances data base and found multiple red-flags in his history (copy of report in paper chart). He has been seeing multiple providers, filling multiple prescriptions for the same medication on the same or close days, and has been in both [place] and [place] seeking care. Given his known and active drug addiction, repeat doctor shopping, and persistent inappropriate use of prescription medications in the past year, I would be extremely judicious about controlled substances use in the hospital and would consider opioid use contraindicated in this patient after discharge unless managed by a pain clinic.  On 2/21: Aeon could recall his behaviors overnight and gave the impression that they were an intentional attempt to enable him to leave the hospital. He has a number of bills due, and he planned to leave and walk home in order to ensure they were taken care of. I questioned his capacity to walk, given reports by physical therapy, but he dismissed my concerns. I also  reiterated the rationale for hospitalization and the need for antibiotics, lab work, and ongoing care. He acknowledged these things, but remained insistent that his priority was paying his bills. Our conversation then shifted as he asked for IV dilaudid. I reinforced our  conversation yesterday about the goal of finding a pain regimen that would be sustainable when he left the hospital, specifically that we would not be using IV pain medication anymore, and we would working to titrate off of the opioids. While he was calm and accepting of this, he did request IV dilaudid another three times later in our conversation.  Today, He is very frustrated and angry about his diet restriction, both the dysphagia and carb modified. He demands regular coke (not thickened) and regular food. Despite education on risk for aspiration seen on prior evaluation, he continues to demand it. I did explain Speech planned to re-evaluate him today or tomorrow to assess for safety of diet upgrade, but this did not reassure him. He is also focused on leaving the hospital as soon as possible (stating multiple times that he was going to walk out today using the walker). I queried his capacity to walk out and his plan for when he got home, but he just perseverated "i'm leaving this shit-hole, fuck it all; I'll be dead at the end of the week anyways." He wasn't able to explain why he thought he would be dead, nor had any specific concerns that led him to that belief. He also refused rehab after discharge.   Recommendations/Plan:  Full code, continue full scope interventions  Continue Zyprexa 22m qHS; he did feel it helped him sleep and slightly less behavioral issues overnight  CM asked to assist pt in getting his bills paid (he states he has the money, just isn't sure how to get the money to the company as he is in the hospital).  Spoke with Speech, plan to reevaluate swallowing today or tomorrow  I would, again, STRONGLY recommend  a Psych consult. I'm concerned his agitated behaviors will escalate if his demands are not met, he may try to leave AMA, and there is an ongoing risk for self-harm with his behaviors and decisions (to include leaving AMA, demanding coke/sprite despite hyperglycemia and aspiration risk, currently refusing rehab, etc.). We need to clarify his capacity for medical decision making and ensure our care adheres to this evaluation.  Symptom management Patients pain and anxiety regimen needs to be simplified with ongoing reduction in controlled substance use. Would avoid IV pain medication, as pt cleared for oral intake.  Pain:   Stop Fentanyl patch   Continue Oxycodone [id number] PRN for breakthrough pain  Continue Gabapentin 60014mID  Anxiety:  Clonazepam to 1mg105mD  Continue Zyprexa 5mg 30m  Goals of Care and Additional Recommendations:  Limitations on Scope of Treatment: Full Scope Treatment  Code Status:  Full code  Prognosis:   Unable to determine  [facility] Planning[facility] care [facility]  Care plan was discussed with pt, care nurse, Primary team notified.  Thank you for allowing the Palliative Medicine Team to assist in the care of this patient.  Time in/out: 0830/0930 Total time: 75 minutes    Greater than 50%  of this time was spent counseling and coordinating care related to the above assessment and plan.  SarahCharlynn CourtPalliative Medicine Team [phone] (7a-5p) Team Phone # [phone]

## 2016-12-30 NOTE — Progress Notes (Addendum)
Current CBG 592. Notified Schorr,NP at 12:20AM. No new orders at this time. Notified Schorr,NP again at 12:40AM. Schorr,NP placed order for STAT Basic Metabolic Panel. No other new orders at this time.

## 2016-12-30 NOTE — Progress Notes (Addendum)
Name: Lawrence Kane MRN: [id number] DOB: 10-Feb-1966    ADMISSION DATE:  12/01/2016 CONSULTATION DATE: 12/01/2016   REFERRING MD :  [name] Transfer   CHIEF COMPLAINT: S/P Tracheostomy   BRIEF PATIENT DESCRIPTION:  51 year old man with HIV/AIDS (first diagnosed 2003, last CD4 count 245 with viremia of 1,160 in October 2017; on Genovya, Darunavir and Bactrim), Genital Warts, COPD, and cocaine substance abuse who presented to [facility] on 1/24 with altered mental status. Intubated for airway protection. Prolonged vent wean due to ESBL klebsiella HCAP with sepsis, s/p trach 2/5.    SIGNIFICANT EVENTS  1/24  Transferred from [facility] 1/25  Underwent LP here 2/05  Trach 2/06  To ATC 2/08  abd distention, likely ileus 2/12  Abd improved, tol TF, weaning on ATC 2/20 Trach downsized to 6 cuffless/cap trials began  2/21 Patient pulled Trach out > replaced by RT  STUDIES:  1/24 CXR > ATX vs LLL infiltrate 1/25 CT head > neg for acute intracranial process 1/25 LP cytology > no malignant cells 1/25 LP > yellow, cloudy, glucose 20, total protein >600, RBC 114>49, WBC 970, 36% neut, 20% lymphs, 19% eos 1/28 BAL cell count > 266 WBC, eos 1 1/28 BAL cytology > no malignant cells 1/27 BAL for wet mount> neg for parasites 1/27 MRI brain > small acute infarcts in cerebellum and cerebral cortex, sucal signal 1/29 LP > Glucose 82, protein >600, WBC 600>885, eosinophils 28% 1/29 Echo > LV EF 60-65%, indeterminent diastolic function, wall motion normal 1/30 EEG >> diffuse slowing and background suppression, no seizures 2/3 MRI thoracic/lumbar/cervical > (-) abscess. Meningitis. R/O CMV 2/8 abd/pevlis CT > borderline to mildly enlarged loops of small bowel throughout the abd with dilated fluid filled R colon and transverses colon, findings are suggestive of an ileus. Partial bowel obstruction included in the differential, but felt less likely. Partial consolidation in the LLL concerning for  PNA.   SUBJECTIVE:  On 2/21 became agitated and pulled trach out. This morning seems calm, remains in bilateral wrist restraints. Has tolerated capping trials.   VITAL SIGNS: Temp:  [98.1 F (36.7 C)-98.5 F (36.9 C)] 98.1 F (36.7 C) (02/22 0515) Pulse Rate:  [56-92] 69 (02/22 0627) Resp:  [18] 18 (02/22 0515) BP: (135-157)/(79-103) 137/103 (02/22 0627) SpO2:  [91 %-100 %] 98 % (02/22 0515) Weight:  [90.1 kg (198 lb 10.2 oz)] 90.1 kg (198 lb 10.2 oz) (02/22 0419)  PHYSICAL EXAMINATION: General: Adult male, no distress  Neuro: Alert, oriented x 3, follows commands, moves extremities  HEENT: capped trach in place > clean, dry Cardiovascular: RRR, no MRG, NI S1/S2 Lungs: Clear breath sounds, non-labored  Abdomen: non-tender, non-distended, active bowel sounds  Musculoskeletal: no acute   Skin: warm, dry, intact    Recent Labs Lab 12/26/16 0622 12/27/16 0511 12/30/16 0046  NA 137 136 134*  K 3.2* 4.0 4.8  CL 106 103 102  CO2 _0 BUN 22* 15 34*  CREATININE 0.60* 0.61 1.25*  GLUCOSE 80 153* 319*    Recent Labs Lab 12/24/16 0402 12/27/16 0511  HGB 11.0* 12.1*  HCT 34.9* 38.0*  WBC 9.4 7.6  PLT 362 289   No results found.  ASSESSMENT / PLAN:  Tracheotomy dependent following prolonged critical illness Plan -Continue working with Speech Therapy - patient has weak cough and poor phonation  -Maintain Saturation >92 (patient is currently on room air) -Continue capping trials  -Routine Trach Care per Protocol  -Will see 3x weekly to follow  progress   Remaninig care per primary team   Hayden Pedro, AG-ACNP [facility]  Pgr: [phone]  PCCM Pgr: [phone]  STAFF NOTE: Linwood Dibbles, MD FACP have personally reviewed patient's available data, including medical history, events of note, physical examination and test results as part of my evaluation. I have discussed with resident/NP and other care providers such as  pharmacist, RN and RRT. In addition, I personally evaluated patient and elicited key findings of: alert, combative and agitation intermittent, noted had pulled trach out and had it replaced, has min secretions, he may not be using PMV well because of cognition factors, we advanced to cap, re assess in am for decannulation, pmv use may not be adequate factor to decide on decanulation, pcxr in am, I revisited with him again, I still have reservations about his airway protection skills and aspiration possbile  Lavon Paganini. Titus Mould, MD, [place] Pgr: [facility] 12/30/2016 4:08 PM

## 2016-12-30 NOTE — Progress Notes (Signed)
Pt aggrivated by diet of pudding thick liquids. This nurse thickened a diet sprite for pt and gave it to the pt, pt began drinking it to fast. This nurse told the pt that he must drink slow and small sips. Pts agitation increased and threw his drink at the nurse.

## 2016-12-31 DIAGNOSIS — D62 Acute posthemorrhagic anemia: Secondary | ICD-10-CM

## 2016-12-31 DIAGNOSIS — Z9119 Patient's noncompliance with other medical treatment and regimen: Secondary | ICD-10-CM

## 2016-12-31 LAB — GLUCOSE, CAPILLARY
GLUCOSE-CAPILLARY: 103 mg/dL — AB (ref 65–99)
GLUCOSE-CAPILLARY: 140 mg/dL — AB (ref 65–99)
GLUCOSE-CAPILLARY: 88 mg/dL (ref 65–99)
Glucose-Capillary: 108 mg/dL — ABNORMAL HIGH (ref 65–99)
Glucose-Capillary: 107 mg/dL — ABNORMAL HIGH (ref 65–99)
Glucose-Capillary: 134 mg/dL — ABNORMAL HIGH (ref 65–99)

## 2016-12-31 LAB — CBC
HCT: 38.7 % — ABNORMAL LOW (ref 39.0–52.0)
HEMOGLOBIN: 12.4 g/dL — AB (ref 13.0–17.0)
MCH: 30.5 pg (ref 26.0–34.0)
MCHC: 32 g/dL (ref 30.0–36.0)
MCV: 95.3 fL (ref 78.0–100.0)
Platelets: 239 10*3/uL (ref 150–400)
RBC: 4.06 MIL/uL — AB (ref 4.22–5.81)
RDW: 18.1 % — ABNORMAL HIGH (ref 11.5–15.5)
WBC: 12.4 10*3/uL — ABNORMAL HIGH (ref 4.0–10.5)

## 2016-12-31 LAB — COMPREHENSIVE METABOLIC PANEL
ALK PHOS: 87 U/L (ref 38–126)
ALT: 62 U/L (ref 17–63)
ANION GAP: 7 (ref 5–15)
AST: 33 U/L (ref 15–41)
Albumin: 2.1 g/dL — ABNORMAL LOW (ref 3.5–5.0)
BILIRUBIN TOTAL: 0.6 mg/dL (ref 0.3–1.2)
BUN: 27 mg/dL — ABNORMAL HIGH (ref 6–20)
CALCIUM: 8.8 mg/dL — AB (ref 8.9–10.3)
CO2: 26 mmol/L (ref 22–32)
Chloride: 103 mmol/L (ref 101–111)
Creatinine, Ser: 1.18 mg/dL (ref 0.61–1.24)
GFR calc non Af Amer: >60 mL/min (ref >60)
Glucose, Bld: 94 mg/dL (ref 65–99)
Potassium: 4.2 mmol/L (ref 3.5–5.1)
Sodium: 136 mmol/L (ref 135–145)
TOTAL PROTEIN: 6.8 g/dL (ref 6.5–8.1)

## 2016-12-31 MED ORDER — OXYCODONE HCL 5 MG PO TABS
5.0000 mg | ORAL_TABLET | Freq: Four times a day (QID) | ORAL | Status: DC | PRN
Start: 2016-12-31 — End: 2017-01-04
  Administered 2016-12-31 – 2017-01-04 (×11): 5 mg via ORAL
  Filled 2016-12-31 (×11): qty 1

## 2016-12-31 NOTE — Progress Notes (Signed)
[facility] PROGRESS NOTE  DAIN LASETER WPY:[id number] DOB: December 11, 1965 DOA: 12/01/2016  PCP: No primary care provider on file.  Brief History/Interval Summary:Keeyon E Bartell is a 51 y.o. male with history of COPD, HIV, substance abuse, noncompliance, recent surgery in [place];  who was admitted via [facility] on 12/01/16 with AMS changes and septic shock due to bacterial meningitis. He was intubated for airway protection and started on broad spectrum antibiotics as well as acyclovir for scrotal lesions. LP with high protein, low glucose and high WBC with predominance of eosinophils. ID consulted and questioned parasitic infection. Repeat LP 1/29 showed elevated glucose, elevated WBC with 28% eosinophils. BAL cytology/wet mount negative for malignant cells or parasites. MRI brain done 1/27 due to ongoing decrease in level of responsiveness, decrease in movement of BUE and flaccid BLE. MRI brain reviewed, showing small infarcts in cerebellum and cortex.  ?due to vasospasms or central/embolic disease and generalized sinuitis/mastoidits. Neurology questioned HSV vasculopathy and recommended MRA brain for work up. MRA brain revealed irregular R-ICA no large vessel stenosis and beaded appearance of right cervical ICA compatible with fibromuscular dysplasia.  Infarcts felt to be due to vasculitis/vasospams secondary to meningoencephalitis. MRI total spine was negative for abscess or hematoma, generalized meningitis and radiculitis and multifocal dorsal thoracic cord signal abnormality from myelitis. 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   He has had loose stools due to partial SBO v/s ileus. Agitation improving. He has completed treatment for  ESBL K-pneumoniae in BAL.   He was unable to tolerate vent wean requiring tracheostomy 2/5. He has been weaned to ATC for past 2 days but continues to have  copious purulent secretions via trach as well as restraints due to poor safety. Therapy ongoing and CIR recommended for follow up therapy.     . Subsequently, patient developed an ileus. NG tube was placed to intermittent suction.  Patient has pulled out his NG tube multiple times this admission. Still unable to swallow. Speech therapy consultation is pending     Assessment/Plan: Acute Hypoxemic Respiratory Failure , Tracheostomy 2/5 This was secondary to HCAP ESBL Klebsiella LLL+ sepsis + Unable to protect airway + pulm edema. Patient was on the ventilator for a prolonged duration. Subsequently underwent tracheostomy placement on 2/5. Pulmonology is following. Patient is off of the Precedex infusion.    Noted to have copious mucopurulent secretions, despite receiving   multiple courses of antibiotics between 1/24-2/7.  Chest x-ray 2/15 showed worsening bronchopneumonia.  patient restarted on meropenem and vancomycin 2/15 ,continue through 2/22. Appreciate PCCM recommendations,Down sized  to # 6 cuffless 2/20.  Will initiate capping trials; ideally  would like him more mobile or at least show me stronger phonation prior to decannulation.  Pulmonary to make further recommendations prior to discharge. Started on a diet, Speech therapy recommends - Dysphagia 1 (Puree) solids;Pudding thick liquid  pulled  out tracheostomy 2/20, 2/23,  it was replaced by RT Discussed with PCCM to see if they could de cannulate him as patient would likely pull it out anyway's ,once he is out of restraints  [name] was contacted by me on 2/23, at 8:20 AM-explained to her that they would like to make a decision about patient's CODE STATUS, his tenuous respiratory status and noncompliance with our medical recommendations, refusal of labs. Patient has been deemed not to have capacity to make his own  medical decisions. Therefore patient's mother would be the one to make all decisions. She will be here on 2/24 at 10  AM   Hypoglycemia/hyperglycemia CBGs elevated due to noncompliance Continue Lantus to 18 units twice a day   Dysarthria,  ruled out for acute CVA, MRI of the brain   was negative     Scrotal wound/history of genital warts Left scrotal area wound is deeper with odor and the head of the penis has wart like growths that area much worse than at the time of admission Started on fluconazole IV Periwound: various wart like lesion, patient with history of genital warts known at the time of admission Awaiting records from outside facility, details of what kind of surgery he had Appreciate urology input-Dr [facility] s/p local excision of scrotal condyloma by Urologist in [place] local wound care  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 HSV positive on CSF from [facility]. Infectious disease has been following. Treated with acyclovir.  for total of 21 days. Completed treatment on 2/17.  Continue Bactrim for PCP prophylaxis.  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 Patient is on acyclovir. High-dose steroids 1000 mg/day , given for 5 days. Now on low-dose steroid. This will need to be slowly tapered down. Neurology has seen the patient. On fentanyl patch for pain issues. Started during this hospitalization.Re-image spine in 2-3 weeks to assess for improvement. Neurology signed off 2/12.  Ileus Improved. Patient had NG tube placed 2/8. Patient has removed his NG several times.     DM, Hyperglycemia  HbA1c 6.6 on 1/26. Continue to adjust insulin based on by mouth intake     Bradycardia Heart rate has improved. Bradycardia was most likely due to medication such as versed. TSH is normal. Continue to monitor.   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 On aspirin. Echocardiogram shows normal  systolic function. LDL 93. PT and OT evaluation. No significant carotid stenosis noted on MRA neck.  he was seen by neurology. They had mentioned that a TEE could be considered once the patient was more stable. This can be pursued at a later date, although embolic stroke appeared to be less of a possibility based on his clinical scenario. Repeat MRI negative for acute CVA  Pulmonary edema Seems to have improved. Continue intermittent IV Lasix,    Moderate to Severe Protein Calorie Malnutrition  We  l resumed oral feeding  on 2/19    Hypernatremia Free water to continue. Increase to every 6 hours. Repeat labs tomorrow. Tube feedings on hold. Sodium level should come down.  Transaminitis Worsened by seroquel likely which was discontinued on 1/31. Improved.   HCV ab+, Chronic hep b Management will be pursued as outpatient.  Normocytic anemia Hemoglobin stable. No evidence for overt bleeding. Continue to monitor.  LLL PNA with ESBL Klebsiella  Restarted antibiotics 2/15, stop 2/22  HIV On antiretroviral treatment. Infectious disease was following.  Metabolic Encephalopathy Continue to monitor. Seems to be stable. PRN Sedative agents for agitation. Restraints in place to prevent him from pulling out supportive tubes.  History of Cocaine Abuse Will need counseling when able to communicate effectively  Disposition-psych consult - HAS NO CAPACITY Contacted mother 2/23 for [place] discussion on 2/24 at 10:00     Consultants: Infectious disease. Critical care medicine. Neurology. Psychiatry  STUDIES:  1/24 CXR >>  ATX vs LLL infiltrate 1/25 CT head >> neg for acute intracranial process 1/25 LP cytology >> no malignant cells 1/25 LP >> yellow, cloudy, glucose 20, total protein >600, RBC 114>49, WBC 970, 36% neutrophil, 20% lymphs, 19% eos 1/28 BAL cell count >> 266 WBC, eos 1 1/28 BAL cytology >> no malignant cells 1/27 BAL for wet mount> neg for parasites 1/27 MRI brain  >> small acute infarcts in cerebellum and cerebral cortex, sucal signal 1/29 LP >> Glucose 82, protein >600, WBC 600>885, eosinophils 28% 1/29 Echo >> LV EF 60-65%, indeterminent diastolic function, wall motion normal 1/30 EEG >> diffuse slowing and background suppression, no seizures 2/3 MRI thoracic/lumbar/cervical >> (-) abscess. Meningitis. R/O CMV   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 1/24 Cryptococcal [name]): negative 1/24 CSF [facility]): NGTD 1/22 scrotal wound cx [name]): MRSA Toxoplasma [name]): Negative 1/25BCx x2 : NGTD 1/25 HCV ab + 1/24 Trach asp 1/24: NGTD 1/24MRSA PCR: MRSA pos 1/25 HIV quant: 80, CD4 10 1/25 CSF AFB: Smear neg, >> 1/25 CSF fungal cx: NGTD 1/25 CSF crypto ag: neg 1/25 CSF gram stain no organisms, cx: NGTD 1/27 strongyloides ab >> negative 1/27 coccidiomycosis ab >> 1/28 BAL AFB smear/cx> 1/28 BAL Fungus cx >> 1/28 BAL gram stain/Cx: 40K ESBL Klebsiella pneumoniae 1/28 BAL pneumocystis smear: Neg 1/27 O&P >> 1/29 CSF Cx >> no organisms on gram stain, >> NGTD 2/04 CSF CMV >> not done?   ANTIBIOTICS: Rocephin 1/24>>1/30 Vancomycin 1/24>>1/30; 2/2 >  Acyclovir 1/24>> 1/26, 1/27>> Ampicillin 1/24>> 1/27 Bactrim M/W/F for PCP prophylaxis Meropenem 1/30>> 2/1; 2/2 >  Pen G 2/1 >> 2/2 Cipro 2/1 >> 2/2 Meropenem/vancomycin-2/15-  SIGNIFICANT EVENTS: 1/24  Transferred from [facility] 1/25  Underwent LP here 2/05  Trach 2/06  To ATC 2/8  patient developed ileus  LINES/TUBES: L IJ CVL 1/24 >> ETT 1/24 >> 2/5 Foley 1/24 >> OGT 1/24 >> 2/5 Trach (df) 2/5 >>   Subjective/Interval H istory: Patient pulled out his tracheostomy again and 2/23  Patient continues to refuse labs  ROS: Unable to do.  Objective:  Vital Signs  Vitals:   12/30/16 2201 12/31/16 0502 12/31/16 0557 12/31/16 0813  BP:   (!) 148/93   Pulse: 82 69 90 89  Resp: _0 Temp:   97.5 F (36.4 C)    TempSrc:   Oral   SpO2: 95% 96% 99%   Weight:   91.6 kg (201 lb 15.1 oz)   Height:        Intake/Output Summary (Last 24 hours) at 12/31/16 0819 Last data filed at 12/31/16 0658  Gross per 24 hour  Intake             1250 ml  Output             3950 ml  Net            -2700 ml   Filed Weights   12/29/16 0608 12/30/16 0419 12/31/16 0557  Weight: 93.4 kg (205 lb 14.6 oz) 90.1 kg (198 lb 10.2 oz) 91.6 kg (201 lb 15.1 oz)    General appearance: Patient is awake and alert. Neck: Tracheostomy noted Resp: Good air entry bilaterally. No wheezing, rales or rhonchi. diminished at the bases.  Cardio: S1, S2, is bradycardic, regular. No S3, S4. No rubs, murmurs, or bruit. Minimal pedal edema.  GI: Abdomen remains soft. Not distended. Bowel sounds are present. No masses or organomegaly. Nontender.  Extremities: Minimal  edema noted bilateral lower extremities Pulses: 2+ and symmetric Neurologic: Patient is awake and alert. Moving his upper extremities. No movement noted in the lower extremities. : Large open wound on left lateral portion of scrotum with purulent discharge. Foley in place.   Lab Results:  Data Reviewed: I have personally reviewed following labs and imaging studies  CBC:  Recent Labs Lab 12/27/16 0511 12/31/16 0546  WBC 7.6 12.4*  HGB 12.1* 12.4*  HCT 38.0* 38.7*  MCV 94.8 95.3  PLT 289 497    Basic Metabolic Panel:  Recent Labs Lab 12/25/16 0605 12/26/16 0622 12/27/16 0511 12/30/16 0046 12/31/16 0546  NA 139 137 136 134* 136  K 3.7 3.2* 4.0 4.8 4.2  CL 107 106 103 102 103  CO2 _0 GLUCOSE 119* 80 153* 319* 94  BUN 23* 22* 15 34* 27*  CREATININE 0.57* 0.60* 0.61 1.25* 1.18  CALCIUM 8.7* 8.5* 8.3* 8.8* 8.8*    GFR: Estimated Creatinine Clearance: 90.9 mL/min (by C-G formula based on SCr of 1.18 mg/dL).  Liver Function Tests:  Recent Labs Lab 12/26/16 0622 12/31/16 0546  AST 55* 33  ALT 83* 62  ALKPHOS 62 87  BILITOT 1.1 0.6   PROT 6.9 6.8  ALBUMIN 2.0* 2.1*     Coagulation Profile: No results for input(s): INR, PROTIME in the last 168 hours.  CBG:  Recent Labs Lab 12/30/16 1231 12/30/16 2039 12/31/16 0016 12/31/16 0436 12/31/16 0645  GLUCAP 140* 153* 140* 88 108*     No results found for this or any previous visit (from the past 240 hour(s)).    Radiology Studies: Dg Swallowing Func-speech Pathology  Result Date: 12/30/2016 Objective Swallowing Evaluation: Type of Study: MBS-Modified Barium Swallow Study Patient Details Name: NICOLUS OSE MRN: [id number] Date of Birth: 07/14/1966 Today's Date: 12/30/2016 Time: SLP Start Time (ACUTE ONLY): 1306-SLP Stop Time (ACUTE ONLY): 1331 SLP Time Calculation (min) (ACUTE ONLY): 25 min Past Medical History: Past Medical History: Diagnosis Date . COPD (chronic obstructive pulmonary disease) ([place])  . Genital warts  . HIV (human immunodeficiency virus infection) ([place])  . Osteoporosis  Past Surgical History: Past Surgical History: Procedure Laterality Date . PROSTATE SURGERY  11/17/2016 HPI: 51 year old man with HIV/AIDS (first diagnosed 2003, COPD, and cocaine substance abuse, recent surgery in [place] (?) who presented to [facility] on 1/24 with complaint of altered mental status. Found to be hypertensive to 240/112. 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 MBS 2/19 recommended Dys 1, pudding thick liquids.  No Data Recorded Assessment / Plan / Recommendation CHL IP CLINICAL IMPRESSIONS 12/30/2016 Clinical Impression Swallow function mildy decreased from prior 2/19 with increased motor impairments observed. 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 Mr. Fleck exhibited a mod-severe pharyngeal dysphagia characterized by both silent aspiration and penetration with nectar and honey thick liquids, which was present  with several trials. Verbal cues to cough, throat clear, and second swallows did not effectively remove the aspirated/penetrated barium or residue. Delayed swallow initiation to valleculae and pyriform sinuses noted throughout the study due to decreased sensation. Significantly increased vallecular and pyriform sinus residue compared to prior MBS (12/27/16) due to reduced tongue base retraction and laryngeal elevation; impaired cervical esophageal relaxation resulted in barium stasis above and below the UES and slow transit (not observed during 2/19 MBS). Educated pt re: severity of swallow impairments and  the high aspiration risk associated primarily with consistencies thinner than pudding thick. Pt was adamant about consuming a regular diet and thin liquids despite SLP education. Recommend Dys 1 (pureed solids), pudding thick liquids via spoon, and meds crushed. MD notified who stated she will speak with pt and pt's mother. ST will f/u. SLP Visit Diagnosis Dysphagia, pharyngeal phase (R13.13);Dysphagia, pharyngoesophageal phase (R13.14) Attention and concentration deficit following -- Frontal lobe and executive function deficit following -- Impact on safety and function Severe aspiration risk   CHL IP TREATMENT RECOMMENDATION 12/30/2016 Treatment Recommendations Therapy as outlined in treatment plan below   Prognosis 12/30/2016 Prognosis for Safe Diet Advancement Fair Barriers to Reach Goals Severity of deficits Barriers/Prognosis Comment -- CHL IP DIET RECOMMENDATION 12/30/2016 SLP Diet Recommendations Pudding thick liquid;Dysphagia 1 (Puree) solids Liquid Administration via Spoon;No straw Medication Administration Crushed with puree Compensations Slow rate;Small sips/bites Postural Changes Seated upright at 90 degrees   CHL IP OTHER RECOMMENDATIONS 12/30/2016 Recommended Consults -- Oral Care Recommendations Oral care BID Other Recommendations Order thickener from pharmacy   CHL IP FOLLOW UP RECOMMENDATIONS 12/30/2016  Follow up Recommendations Other (comment)   CHL IP FREQUENCY AND DURATION 12/30/2016 Speech Therapy Frequency (ACUTE ONLY) min 2x/week Treatment Duration 2 weeks      CHL IP ORAL PHASE 12/30/2016 Oral Phase WFL Oral - Pudding Teaspoon -- Oral - Pudding Cup -- Oral - Honey Teaspoon -- Oral - Honey Cup WFL Oral - Nectar Teaspoon -- Oral - Nectar Cup WFL Oral - Nectar Straw -- Oral - Thin Teaspoon -- Oral - Thin Cup -- Oral - Thin Straw -- Oral - Puree WFL Oral - Mech Soft -- Oral - Regular -- Oral - Multi-Consistency -- Oral - Pill -- Oral Phase - Comment --  CHL IP PHARYNGEAL PHASE 12/30/2016 Pharyngeal Phase Impaired Pharyngeal- Pudding Teaspoon -- Pharyngeal -- Pharyngeal- Pudding Cup -- Pharyngeal -- Pharyngeal- Honey Teaspoon NT Pharyngeal -- Pharyngeal- Honey Cup Delayed swallow initiation-vallecula;Delayed swallow initiation-pyriform sinuses;Penetration/Aspiration during swallow;Pharyngeal residue - valleculae;Pharyngeal residue - pyriform;Pharyngeal residue - cp segment;Reduced tongue base retraction;Reduced laryngeal elevation Pharyngeal Material enters airway, remains ABOVE vocal cords then ejected out;Material enters airway, passes BELOW cords without attempt by patient to eject out (silent aspiration) Pharyngeal- Nectar Teaspoon NT Pharyngeal -- Pharyngeal- Nectar Cup Delayed swallow initiation-vallecula;Delayed swallow initiation-pyriform sinuses;Penetration/Aspiration during swallow;Pharyngeal residue - valleculae;Pharyngeal residue - pyriform;Pharyngeal residue - cp segment;Reduced tongue base retraction Pharyngeal Material enters airway, passes BELOW cords without attempt by patient to eject out (silent aspiration) Pharyngeal- Nectar Straw -- Pharyngeal -- Pharyngeal- Thin Teaspoon -- Pharyngeal -- Pharyngeal- Thin Cup -- Pharyngeal -- Pharyngeal- Thin Straw -- Pharyngeal -- Pharyngeal- Puree Pharyngeal residue - valleculae;Pharyngeal residue - pyriform;Reduced tongue base retraction;Reduced laryngeal  elevation Pharyngeal -- Pharyngeal- Mechanical Soft -- Pharyngeal -- Pharyngeal- Regular -- Pharyngeal -- Pharyngeal- Multi-consistency -- Pharyngeal -- Pharyngeal- Pill -- Pharyngeal -- Pharyngeal Comment --  CHL IP CERVICAL ESOPHAGEAL PHASE 12/30/2016 Cervical Esophageal Phase Impaired Pudding Teaspoon -- Pudding Cup -- Honey Teaspoon -- Honey Cup Reduced cricopharyngeal relaxation Nectar Teaspoon -- Nectar Cup Reduced cricopharyngeal relaxation Nectar Straw -- Thin Teaspoon -- Thin Cup -- Thin Straw -- Puree Reduced cricopharyngeal relaxation Mechanical Soft -- Regular -- Multi-consistency -- Pill -- Cervical Esophageal Comment -- No flowsheet data found. [place] Siren 12/30/2016, 3:15 PM  Orbie Pyo Colvin Caroli.Ed CCC-SLP Pager [phone]               Medications:  Scheduled: . aspirin  325 mg Oral Daily  . chlorhexidine gluconate (MEDLINE  KIT)  15 mL Mouth Rinse BID  . clonazePAM  1 mg Oral TID  . darunavir-cobicistat  1 tablet Oral Q breakfast  . dolutegravir  50 mg Oral Daily  . emtricitabine-tenofovir AF  1 tablet Oral Daily  . fluconazole  200 mg Oral Daily  . gabapentin  600 mg Oral TID  . heparin  5,000 Units Subcutaneous Q8H  . insulin aspart  0-15 Units Subcutaneous Q4H  . insulin glargine  18 Units Subcutaneous BID  . lactulose  10 g Oral BID  . mouth rinse  15 mL Mouth Rinse QID  . OLANZapine  5 mg Oral QHS  . pantoprazole sodium  40 mg Oral Daily  . scopolamine  1 patch Transdermal Q72H  . sulfamethoxazole-trimethoprim  20 mL Oral Daily   Continuous: . sodium chloride 75 mL/hr at 12/31/16 0323  . feeding supplement (JEVITY 1.2 CAL) Stopped (12/24/16 1832)   VEL:FYBOFBPZWCHEN, chlorproMAZINE (THORAZINE) IV, docusate, guaiFENesin-dextromethorphan, haloperidol lactate, hydrALAZINE, oxyCODONE, RESOURCE THICKENUP CLEAR, sennosides      DVT Prophylaxis: Subcutaneous heparin    Code Status: Full code  Family Communication: No family at bedside  Disposition Plan:  not stable for discharge     LOS: 30 days   [place] [facility] Pager [phone] 12/31/2016, 8:19 AM  If 7PM-7AM, please contact night-coverage at [email or web address], password [facility]

## 2016-12-31 NOTE — Progress Notes (Signed)
PT Cancellation Note  Patient Details Name: Lawrence NorthDarren E Kane MRN: [id number] DOB: [birth year]   Cancelled Treatment:    Reason Eval/Treat Not Completed: Patient declined, no reason specified (pt is refusing due to stating he is still eating x 2).  Upset about needing thickened sprite.  Will have PT see him Monday to progress and update goals as is appropriate.   Ivar DrapeRuth E Kaiesha Tonner 12/31/2016, 2:02 PM   [name], PT MS Acute Rehab Dept. Number: [facility] [phone] and [facility] [phone]

## 2016-12-31 NOTE — Progress Notes (Signed)
At 0645 central telemetry called that pt was not showing on the monitor.  Went to pt room and found that he had taken off his trach collar and tied it on the bed rail.  Respiratory team was called and new trach collar inserted.

## 2016-12-31 NOTE — Progress Notes (Signed)
CSW continuing to follow- no SNF bed available at this time- pt continued agitation continues to be a barrie  Lawrence Kane H. UrisBurna Sis, LCSW Clinical Social Worker [phone]

## 2016-12-31 NOTE — Progress Notes (Addendum)
Daily Progress Note   Patient Name: Lawrence Kane       Date: 12/31/2016 DOB: [birth year]  Age: 51 y.o. MRN#: [id number] Attending Physician: Reyne Dumas, MD Primary Care Physician: No primary care provider on file. Admit Date: 12/01/2016  Reason for Consultation/Follow-up: Non pain symptom management, Pain control and Psychosocial/spiritual support  Subjective: Lawrence Kane is alert and fully oriented in bed. He continues to be frustrated by his restricted diet. He could relate the general findings of the swallow study yesterday: "I'm worse than I was before," but feels this is not a reason to restrict him from eating "big sandwiches and regular coke." We also discussed the requirement for his mom to help with medical decision making, which he was fine with. Throughout our conversation he fixated on needing to leave the hospital, feeling extremely hungry, and wanting regular food.   Length of Stay: 30  Current Medications: Scheduled Meds:  . aspirin  325 mg Oral Daily  . chlorhexidine gluconate (MEDLINE KIT)  15 mL Mouth Rinse BID  . clonazePAM  1 mg Oral TID  . darunavir-cobicistat  1 tablet Oral Q breakfast  . dolutegravir  50 mg Oral Daily  . emtricitabine-tenofovir AF  1 tablet Oral Daily  . gabapentin  600 mg Oral TID  . heparin  5,000 Units Subcutaneous Q8H  . insulin aspart  0-15 Units Subcutaneous Q4H  . insulin glargine  18 Units Subcutaneous BID  . lactulose  10 g Oral BID  . mouth rinse  15 mL Mouth Rinse QID  . OLANZapine  5 mg Oral QHS  . pantoprazole sodium  40 mg Oral Daily  . scopolamine  1 patch Transdermal Q72H  . sulfamethoxazole-trimethoprim  20 mL Oral Daily    Continuous Infusions: . sodium chloride 75 mL/hr at 12/31/16 0323  . feeding supplement (JEVITY 1.2 CAL) Stopped (12/24/16 1832)    PRN  Meds: acetaminophen, chlorproMAZINE (THORAZINE) IV, docusate, guaiFENesin-dextromethorphan, haloperidol lactate, hydrALAZINE, oxyCODONE, RESOURCE THICKENUP CLEAR, sennosides  Physical Exam  Constitutional: He is oriented to person, place, and time. He appears well-developed and well-nourished.  HENT:  Left ear with healing skin tear, healing lesions on lips. Tongue now with brown coating.   Eyes: EOM are normal.  Neck: Normal range of motion. Neck supple.  Trach present on room air  Cardiovascular: Normal rate.   Pulmonary/Chest: Effort normal. He has no wheezes. He has rhonchi (throughout).  Trach in place, capped. Thick secretions around trach site. Voice back at a whisper.  Abdominal: Soft. Bowel sounds are normal. He exhibits no distension. There is no tenderness.  Musculoskeletal:  Generalized weakness. Full movement of BUE. Conflicting report on movement of BLE. He says he can move them--but doesn't on request. States they will work better when he is allowed to go walking.  Neurological: He is alert and oriented to person, place, and time.  Orientation a little questionable. Answers all orientation questions, remembers my name and our conversations over the past few days. Does not seem to hear/understand rationale for medical interventions and restrictions.  Skin: Skin is warm and dry.  Known skin breakdown on scrotum, however he refused evaluation  Psychiatric: His affect is angry and blunt. His speech is  slurred (whisper). He is agitated. He expresses impulsivity and inappropriate judgment. He exhibits abnormal recent memory.  Fixated on leaving the hospital, feeling starved, and wanting regular food.            Vital Signs: BP (!) 148/93 (BP Location: Left Arm)   Pulse 89   Temp 97.5 F (36.4 C) (Oral)   Resp 18   Ht 6' 4"  (1.93 m)   Wt 91.6 kg (201 lb 15.1 oz)   SpO2 99%   BMI 24.58 kg/m  SpO2: SpO2: 99 % O2 Device: O2 Device: Not Delivered O2 Flow Rate: O2 Flow Rate  (L/min): 6 L/min  Intake/output summary:   Intake/Output Summary (Last 24 hours) at 12/31/16 1013 Last data filed at 12/31/16 6834  Gross per 24 hour  Intake             1250 ml  Output             3500 ml  Net            -2250 ml   LBM: Last BM Date: 12/30/16 Baseline Weight: Weight: 93 kg (205 lb) Most recent weight: Weight: 91.6 kg (201 lb 15.1 oz)       Palliative Assessment/Data: PPS 50%   Flowsheet Rows   Flowsheet Row Most Recent Value  Intake Tab  Referral Department  Hospitalist  Unit at Time of Referral  Med/Surg Unit  Palliative Care Primary Diagnosis  Sepsis/Infectious Disease  Date Notified  12/24/16  Palliative Care Type  New Palliative care  Reason for referral  Clarify Goals of Care  Date of Admission  12/01/16  Date first seen by Palliative Care  12/25/16  # of days Palliative referral response time  1 Day(s)  # of days IP prior to Palliative referral  23  Clinical Assessment  Palliative Performance Scale Score  30%  Psychosocial & Spiritual Assessment  Palliative Care Outcomes  Patient/Family meeting held?  Yes  Who was at the meeting?  patient and mother on the phone  Palliative Care Outcomes  Clarified goals of care      Patient Active Problem List   Diagnosis Date Noted  . Adjustment disorder with mixed anxiety and depressed mood 12/30/2016  . Goals of care, counseling/discussion   . Palliative care encounter   . Respiratory distress   . Agitation   . Hypernatremia   . Leukocytosis   . Acute blood loss anemia   . Pain   . Acute respiratory failure with hypoxia ([place])   . Tracheostomy status ([place])   . Hypoxemia   . Muscle weakness (generalized)   . Ileus ([place])   . Abdominal distention   . Transverse myelitis ([place])   . Encounter for orogastric (OG) tube placement   . Somnolence   . Paraplegia ([place])   . Acute pulmonary edema ([facility])   . Neurosyphilis   . Herpesviral meningitis   . Cerebral embolism with cerebral infarction 12/08/2016   . Endotracheally intubated   . Meningoencephalitis   . Acute encephalopathy   . Acute respiratory failure ([place]) 12/02/2016  . Altered mental status   . COPD (chronic obstructive pulmonary disease) ([place]) 12/01/2016  . Substance abuse 12/01/2016  . Genital warts 12/01/2016  . HIV (human immunodeficiency virus infection) ([place]) 12/01/2016  . AIDS (acquired immune deficiency syndrome) ([place]) 12/01/2016  . Noncompliance 12/01/2016  . Bacterial meningitis 12/01/2016  . Hypertensive urgency 12/01/2016  . Septic shock ([place]) 12/01/2016  . Transaminitis 12/01/2016  . Cocaine abuse 12/01/2016  .  Open wound 12/01/2016    Palliative Care Assessment & Plan   HPI: 51 y.o. male  with past medical history of HIV/AIDS, chronic Hep B, COPD, Osteoporosis, genital warts, and cocaine use who originally presented to [facility] with AMS and agitation. He was intubated for airway protection and transferred to [facility]. He was admitted here on 12/01/2016. 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 He was intubated for acute respiratory failure, and a tracheostomy was placed. He did develop an ileus, however repeatedly pulled out his NG tube. Swallow evaluation pending.   Assessment: I met Lawrence Kane, his mother, brother, and sister-in-law at his bedside on 2/19. Please see my initial consult note for full details of that conversation. In brief, Lawrence Kane referred to his mother to make decisions about his code status, but was convinced he would die prior to discharge.On 2/20, I called his mother to follow-up on our code status conversation. She did not feel she had adequate time to discuss these issues with Lawrence Kane, and asked to continue Full code status until she has a better chance to talk with her son. Lawrence Kane continued to defer to his mother for decision  making.   On 2/20: During my visit, Lawrence Kane continuously mentioned his uncontrolled pain and high anxiety. As I explored this further he related that he took percocet at home (despite stating a tylenol allergy), which was in addition to "smoking joints." I asked about the cocaine use, which he did acknowledge using recently. Prior to admission he has listed Percocet and Gabapentin as home medications. He could not recall what the gabapentin was for, and he reported the Percocet was for "body pain," but could be no more specific. He was also unable to tell me who prescribed the medication. I did a query of the controlled substances data base and found multiple red-flags in his history (copy of report in paper chart). He has been seeing multiple providers, filling multiple prescriptions for the same medication on the same or close days, and has been in both [place] and [place] seeking care. Given his known and active drug addiction, repeat doctor shopping, and persistent inappropriate use of prescription medications in the past year, I would be extremely judicious about controlled substances use in the hospital and would consider opioid use contraindicated in this patient after discharge unless managed by a pain clinic.  On 2/21: Lawrence Kane could recall his behaviors overnight and gave the impression that they were an intentional attempt to enable him to leave the hospital. He has a number of bills due, and he planned to leave and walk home in order to ensure they were taken care of. I questioned his capacity to walk, given reports by physical therapy, but he dismissed my concerns. I also reiterated the rationale for hospitalization and the need for antibiotics, lab work, and ongoing care. He acknowledged these things, but remained insistent that his priority was paying his bills. Our conversation then shifted as he asked for IV dilaudid. I reinforced our conversation yesterday about the goal of finding a pain  regimen that would be sustainable when he left the hospital, specifically that we would not be using IV pain medication anymore, and we would working to titrate off of the opioids. While he was calm and accepting of this, he did request IV dilaudid another three times later in our conversation.  On 2/22: He was very  frustrated and angry about his diet restriction, both the dysphagia and carb modified. He demanded regular coke (not thickened) and regular food. Despite education on risk for aspiration seen on prior evaluation, he continued to demand it. I did explain Speech planned to re-evaluate to assess for safety of diet upgrade, but this did not reassure him. He is also focused on leaving the hospital as soon as possible (stating multiple times that he was going to walk out today using the walker). I queried his capacity to walk out and his plan for when he got home, but he just perseverated "i'm leaving this shit-hole, fuck it all; I'll be dead at the end of the week anyways." He wasn't able to explain why he thought he would be dead, nor had any specific concerns that led him to that belief. He also refused rehab after discharge.   Today, Lawrence Kane continues to fixate on leaving the hospital, feeling starved, and wanting regular food. Psych was consulted yesterday and determined he does not have capacity to make medical decisions or determine his living arrangements. Speech also found his swallowing function was mildly decreased from prior assessment on 2/19, and continued to recommend Dysphagia 1, pudding thick liquids via spoon, and meds crushed. I talked to Lawrence Kane about speech's recommendation, and reinforced that he would need to follow nursing instruction on eating. If he did not, he would not be allowed to self-feed and his nurse/nurse tech would be feeding him in accordance with Speech instructions. He acknowledged this and agreed to follow their directions.  Recommendations/Plan:  Full code,  continue full scope interventions  Continue Zyprexa 26m qHS  Spoke with pt's mom, plan for family meeting on Sunday at 1300.  Symptom management Patients pain and anxiety regimen needs to be simplified with ongoing reduction in controlled substance use. Would avoid IV pain medication, as pt cleared for oral intake.  Pain:   Oxycodone 524mchanged to q6H PRN; will plan to stop this over the weekend  Continue Gabapentin 60057mID  Anxiety:  Clonazepam to 1mg87mD  Zyprexa 5mg 41m  Goals of Care and Additional Recommendations:  Limitations on Scope of Treatment: Full Scope Treatment  Code Status:  Full code  Prognosis:   Unable to determine  [facility]  Care plan was discussed with pt, care nurse, and pt's mother.  Thank you for allowing the Palliative Medicine Team to assist in the care of this patient.  Total time: 35 minutes    Greater than 50%  of this time was spent counseling and coordinating care related to the above assessment and plan.  SarahCharlynn CourtPalliative Medicine Team [phone] (7a-5p) Team Phone # [phone]

## 2016-12-31 NOTE — Progress Notes (Signed)
Patient ID: Lawrence NorthDarren E Buscemi, male   DOB: October 24, 1966, 51 y.o.   MRN: [id number] Await meeting with mother and pallaitive care If less aggressive care sought could dc trach if understand risk - death  Mcarthur RossettiDaniel J. Tyson AliasFeinstein, MD, FACP Pgr: [phone] [facility]

## 2016-12-31 NOTE — Progress Notes (Signed)
12/31/2016- Respiratory care called to room at approx 0750 due to pt pulling trach tube out.  New cuffless shiley #6 tube placed with no complications.  Pt tolerated well.  Tube placement confirmed and trach button placed as had been prior to self decannulation.  Sats 97% on room air.  Will cont to follow progress.  RN at bedside along with Lysbeth PennerMary Smith RRT, RCP to assist with trach placement.  New trach ordered to be at bedside.

## 2016-12-31 NOTE — Progress Notes (Signed)
AT 0436 CBG was 88; orange juice give and a recheck CBG was 108.

## 2017-01-01 DIAGNOSIS — R451 Restlessness and agitation: Secondary | ICD-10-CM

## 2017-01-01 DIAGNOSIS — J810 Acute pulmonary edema: Secondary | ICD-10-CM

## 2017-01-01 DIAGNOSIS — G009 Bacterial meningitis, unspecified: Secondary | ICD-10-CM

## 2017-01-01 DIAGNOSIS — R140 Abdominal distension (gaseous): Secondary | ICD-10-CM

## 2017-01-01 DIAGNOSIS — Z515 Encounter for palliative care: Secondary | ICD-10-CM

## 2017-01-01 LAB — GLUCOSE, CAPILLARY
GLUCOSE-CAPILLARY: 86 mg/dL (ref 65–99)
GLUCOSE-CAPILLARY: 110 mg/dL — AB (ref 65–99)
Glucose-Capillary: 110 mg/dL — ABNORMAL HIGH (ref 65–99)
Glucose-Capillary: 125 mg/dL — ABNORMAL HIGH (ref 65–99)
Glucose-Capillary: 129 mg/dL — ABNORMAL HIGH (ref 65–99)
Glucose-Capillary: 79 mg/dL (ref 65–99)

## 2017-01-01 MED ORDER — CLONAZEPAM 0.5 MG PO TABS
0.5000 mg | ORAL_TABLET | Freq: Three times a day (TID) | ORAL | Status: DC
  Administered 2017-01-01 – 2017-01-04 (×9): 0.5 mg via ORAL
  Filled 2017-01-01 (×9): qty 1

## 2017-01-01 MED ORDER — GABAPENTIN 100 MG PO CAPS
200.0000 mg | ORAL_CAPSULE | Freq: Three times a day (TID) | ORAL | Status: DC
  Administered 2017-01-01 – 2017-01-03 (×6): 200 mg via ORAL
  Filled 2017-01-01 (×6): qty 2

## 2017-01-01 NOTE — Progress Notes (Addendum)
[facility] PROGRESS NOTE  MANCIL PFENNING DJM:[id number] DOB: 22-Apr-1966 DOA: 12/01/2016  PCP: No primary care provider on file.  Brief History/Interval Summary:Lawrence Kane is a 51 y.o. male with history of COPD, HIV, substance abuse, noncompliance, recent surgery in [place];  who was admitted via [facility] on 12/01/16 with AMS changes and septic shock due to bacterial meningitis. He was intubated for airway protection and started on broad spectrum antibiotics as well as acyclovir for scrotal lesions. LP with high protein, low glucose and high WBC with predominance of eosinophils. ID consulted and questioned parasitic infection. Repeat LP 1/29 showed elevated glucose, elevated WBC with 28% eosinophils. BAL cytology/wet mount negative for malignant cells or parasites. MRI brain done 1/27 due to ongoing decrease in level of responsiveness, decrease in movement of BUE and flaccid BLE. MRI brain reviewed, showing small infarcts in cerebellum and cortex.  ?due to vasospasms or central/embolic disease and generalized sinuitis/mastoidits. Neurology questioned HSV vasculopathy and recommended MRA brain for work up. MRA brain revealed irregular R-ICA no large vessel stenosis and beaded appearance of right cervical ICA compatible with fibromuscular dysplasia.  Infarcts felt to be due to vasculitis/vasospams secondary to meningoencephalitis. MRI total spine was negative for abscess or hematoma, generalized meningitis and radiculitis and multifocal dorsal thoracic cord signal abnormality from myelitis. 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   He has had loose stools due to partial SBO v/s ileus. Agitation improving. He has completed treatment for  ESBL K-pneumoniae in BAL.   He was unable to tolerate vent wean requiring tracheostomy 2/5. He has been weaned to ATC for past 2 days but continues to have  copious purulent secretions via trach as well as restraints due to poor safety. Therapy ongoing and CIR recommended for follow up therapy.     . Subsequently, patient developed an ileus. NG tube was placed to intermittent suction.  Patient has pulled out his NG tube multiple times this admission. Still unable to swallow. Speech therapy evaluating   patient on a daily basis     Assessment/Plan: Acute Hypoxemic Respiratory Failure , Tracheostomy 2/5 This was secondary to HCAP ESBL Klebsiella LLL+ sepsis + Unable to protect airway + pulm edema. Patient was on the ventilator for a prolonged duration. Subsequently underwent tracheostomy placement on 2/5. Pulmonology is following. Patient is off of the Precedex infusion.    Noted to have copious mucopurulent secretions, despite receiving   multiple courses of antibiotics between 1/24-2/7.  Chest x-ray 2/15 showed worsening bronchopneumonia.  patient restarted on meropenem and vancomycin 2/15 ,continue through 2/22. Appreciate PCCM recommendations,Down sized  to # 6 cuffless 2/20.  Will initiate capping trials; ideally  would like him more mobile or at least show me stronger phonation prior to decannulation.  Pulmonary to make further recommendations prior to discharge. Started on a diet, Speech therapy recommends - Dysphagia 1 (Puree) solids;Pudding thick liquid  pulled  out tracheostomy 2/20, 2/23,  it was replaced by RT Discussed with PCCM to see if they could de cannulate him as patient would likely pull it out anyway's ,once he is out of restraints  [name] was contacted by me on 2/23, at 8:20 AM-explained to her that they should make a decision about patient's CODE STATUS, his tenuous respiratory status and noncompliance with our medical recommendations, refusal of labs, refusal to follow speech therapy recommendations. Patient has been  deemed not to have capacity to make his own medical decisions by psychiatry. Therefore patient's mother  would be the one to make all decisions. She will be here on 2/24 at 10 AM   Hypoglycemia/hyperglycemia CBGs elevated due to noncompliance Continue Lantus to 18 units twice a day   Dysarthria,  ruled out for acute CVA, MRI of the brain   was negative     Scrotal wound/history of genital warts Left scrotal area wound is deeper with odor and the head of the penis has wart like growths that area much worse than at the time of admission Started on fluconazole IV Periwound: various wart like lesion, patient with history of genital warts known at the time of admission Status post consult by urology  -Dr Tresa Moore s/p local excision of scrotal condyloma by Urologist in [place] local wound care, no further recommendations  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 HSV positive on CSF from [facility]. Infectious disease has been following. Treated with acyclovir.  for total of 21 days. Completed treatment on 2/17.  Continue Bactrim for PCP prophylaxis.  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 Patient is on acyclovir. High-dose steroids 1000 mg/day , given for 5 days. Now on low-dose steroid. This will need to be slowly tapered down. Neurology has seen the patient. On fentanyl patch for pain issues. Started during this hospitalization.Re-image spine in 2-3 weeks to assess for improvement. Neurology signed off 2/12.  Ileus Improved. Patient had NG tube placed 2/8. Patient has removed his NG several times during this hospitalization.     DM, Hyperglycemia  HbA1c 6.6 on 1/26. Continue to adjust insulin based on by mouth intake     Bradycardia Heart rate has improved. Bradycardia was most likely due to medication such as versed. TSH is normal. Continue to monitor.   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 On aspirin. Echocardiogram shows normal systolic function. LDL 93. PT and OT evaluation. No significant carotid stenosis noted on MRA neck.  he was seen by neurology. They had mentioned that a TEE could be considered once the patient was more stable. This can be pursued at a later date, although embolic stroke appeared to be less of a possibility based on his clinical scenario. Repeat MRI negative for acute CVA  Pulmonary edema Seems to have improved. Continue intermittent IV Lasix,    Moderate to Severe Protein Calorie Malnutrition  We  l resumed oral feeding  on 2/19    Hypernatremia Free water to continue. Increase to every 6 hours. Repeat labs tomorrow. Tube feedings on hold. Sodium level should come down.  Transaminitis Worsened by seroquel likely which was discontinued on 1/31. Improved.   HCV ab+, Chronic hep b Management will be pursued as outpatient.  Normocytic anemia Hemoglobin stable. No evidence for overt bleeding. Continue to monitor.  LLL PNA with ESBL Klebsiella  Restarted antibiotics 2/15, stop 2/22  HIV On antiretroviral treatment. Infectious disease was following.  Metabolic Encephalopathy Continue to monitor. Seems to be stable. PRN Sedative agents for agitation. Restraints in place to prevent him from pulling out supportive tubes.  History of Cocaine Abuse Will need counseling when able to communicate effectively  Disposition-psych consult - HAS NO CAPACITY Contacted mother 2/23 for [place] discussion on 2/24 at 10:00 We completed a 1 hour meeting with wife ,sister and  2 neices between 12-1:00 pm     Consultants: Infectious disease. Critical care medicine. Neurology. Psychiatry, palliative care    STUDIES:  1/24 CXR >> ATX vs LLL infiltrate 1/25 CT head >> neg for acute intracranial process 1/25 LP cytology >> no malignant cells 1/25 LP >> yellow, cloudy, glucose 20, total protein >600, RBC 114>49, WBC 970, 36% neutrophil, 20%  lymphs, 19% eos 1/28 BAL cell count >> 266 WBC, eos 1 1/28 BAL cytology >> no malignant cells 1/27 BAL for wet mount> neg for parasites 1/27 MRI brain >> small acute infarcts in cerebellum and cerebral cortex, sucal signal 1/29 LP >> Glucose 82, protein >600, WBC 600>885, eosinophils 28% 1/29 Echo >> LV EF 60-65%, indeterminent diastolic function, wall motion normal 1/30 EEG >> diffuse slowing and background suppression, no seizures 2/3 MRI thoracic/lumbar/cervical >> (-) abscess. Meningitis. R/O CMV   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 1/24 Cryptococcal [name]): negative 1/24 CSF [facility]): NGTD 1/22 scrotal wound cx [name]): MRSA Toxoplasma [name]): Negative 1/25BCx x2 : NGTD 1/25 HCV ab + 1/24 Trach asp 1/24: NGTD 1/24MRSA PCR: MRSA pos 1/25 HIV quant: 80, CD4 10 1/25 CSF AFB: Smear neg, >> 1/25 CSF fungal cx: NGTD 1/25 CSF crypto ag: neg 1/25 CSF gram stain no organisms, cx: NGTD 1/27 strongyloides ab >> negative 1/27 coccidiomycosis ab >> 1/28 BAL AFB smear/cx> 1/28 BAL Fungus cx >> 1/28 BAL gram stain/Cx: 40K ESBL Klebsiella pneumoniae 1/28 BAL pneumocystis smear: Neg 1/27 O&P >> 1/29 CSF Cx >> no organisms on gram stain, >> NGTD 2/04 CSF CMV >> not done?   ANTIBIOTICS: Rocephin 1/24>>1/30 Vancomycin 1/24>>1/30; 2/2 >  Acyclovir 1/24>> 1/26, 1/27>> Ampicillin 1/24>> 1/27 Bactrim M/W/F for PCP prophylaxis Meropenem 1/30>> 2/1; 2/2 >  Pen G 2/1 >> 2/2 Cipro 2/1 >> 2/2 Meropenem/vancomycin-2/15-  SIGNIFICANT EVENTS: 1/24  Transferred from [facility] 1/25  Underwent LP here 2/05  Trach 2/06  To ATC 2/8  patient developed ileus  LINES/TUBES: L IJ CVL 1/24 >> ETT 1/24 >> 2/5 Foley 1/24 >> OGT 1/24 >> 2/5 Trach (df) 2/5 >>   Subjective/Interval H istory:  continues to fixate on leaving the hospital, feeling starved, and wanting regular food Objective:  Vital Signs  Vitals:   12/31/16 2129  01/01/17 0527 01/01/17 0655 01/01/17 0817  BP: (!) 131/96  136/85   Pulse: 92  93 90  Resp: _0 Temp: 98.4 F (36.9 C)  98.4 F (36.9 C)   TempSrc: Oral  Oral   SpO2: 98%  94%   Weight:  87.1 kg (192 lb)    Height:        Intake/Output Summary (Last 24 hours) at 01/01/17 0932 Last data filed at 01/01/17 0755  Gross per 24 hour  Intake              960 ml  Output             3976 ml  Net            -3016 ml   Filed Weights   12/30/16 0419 12/31/16 0557 01/01/17 0527  Weight: 90.1 kg (198 lb 10.2 oz) 91.6 kg (201 lb 15.1 oz) 87.1 kg (192 lb)   Generalized weakness. Full movement of BUE. Conflicting report on movement of BLE. He says he can move them--but doesn't on request. States they will work better when he is allowed to go walking.  Neurological: He is alert and oriented to  person, place, and time.  Orientation a little questionable. Answers all orientation questions, remembers my name and our conversations over the past few days. Does not seem to hear/understand rationale for medical interventions and restrictions.  Skin: Skin is warm and dry.  Known skin breakdown on scrotum, however he refused evaluation  Psychiatric: His affect is angry and blunt. His speech is slurred (whisper). He is agitated. He expresses impulsivity and inappropriate judgment. He exhibits abnormal recent memory.  Fixated on leaving the hospital, feeling starved, and wanting regular food..   Lab Results:  Data Reviewed: I have personally reviewed following labs and imaging studies  CBC:  Recent Labs Lab 12/27/16 0511 12/31/16 0546  WBC 7.6 12.4*  HGB 12.1* 12.4*  HCT 38.0* 38.7*  MCV 94.8 95.3  PLT 289 161    Basic Metabolic Panel:  Recent Labs Lab 12/26/16 0622 12/27/16 0511 12/30/16 0046 12/31/16 0546  NA 137 136 134* 136  K 3.2* 4.0 4.8 4.2  CL 106 103 102 103  CO2 _0 GLUCOSE 80 153* 319* 94  BUN 22* 15 34* 27*  CREATININE 0.60* 0.61 1.25* 1.18  CALCIUM 8.5*  8.3* 8.8* 8.8*    GFR: Estimated Creatinine Clearance: 90.9 mL/min (by C-G formula based on SCr of 1.18 mg/dL).  Liver Function Tests:  Recent Labs Lab 12/26/16 0622 12/31/16 0546  AST 55* 33  ALT 83* 62  ALKPHOS 62 87  BILITOT 1.1 0.6  PROT 6.9 6.8  ALBUMIN 2.0* 2.1*     Coagulation Profile: No results for input(s): INR, PROTIME in the last 168 hours.  CBG:  Recent Labs Lab 12/31/16 1705 12/31/16 2136 01/01/17 0037 01/01/17 0514 01/01/17 0829  GLUCAP 103* 134* 129* 110* 86     No results found for this or any previous visit (from the past 240 hour(s)).    Radiology Studies: Dg Swallowing Func-speech Pathology  Result Date: 12/30/2016 Objective Swallowing Evaluation: Type of Study: MBS-Modified Barium Swallow Study Patient Details Name: Lawrence Kane MRN: [id number] Date of Birth: 04/14/1966 Today's Date: 12/30/2016 Time: SLP Start Time (ACUTE ONLY): 1306-SLP Stop Time (ACUTE ONLY): 1331 SLP Time Calculation (min) (ACUTE ONLY): 25 min Past Medical History: Past Medical History: Diagnosis Date . COPD (chronic obstructive pulmonary disease) ([place])  . Genital warts  . HIV (human immunodeficiency virus infection) ([place])  . Osteoporosis  Past Surgical History: Past Surgical History: Procedure Laterality Date . PROSTATE SURGERY  11/17/2016 HPI: 51 year old man with HIV/AIDS (first diagnosed 2003, COPD, and cocaine substance abuse, recent surgery in [place] (?) who presented to [facility] on 1/24 with complaint of altered mental status. Found to be hypertensive to 240/112. 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 MBS 2/19 recommended Dys 1, pudding thick liquids.  No Data Recorded Assessment / Plan / Recommendation CHL IP CLINICAL IMPRESSIONS 12/30/2016 Clinical Impression Swallow function mildy decreased from prior 2/19 with increased  motor impairments observed. 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 Mr. Litzinger exhibited a mod-severe pharyngeal dysphagia characterized by both silent aspiration and penetration with nectar and honey thick liquids, which was present with several trials. Verbal cues to cough, throat clear, and second swallows did not effectively remove the aspirated/penetrated barium or residue. Delayed swallow initiation to valleculae and pyriform sinuses noted throughout the study due to decreased sensation. Significantly increased vallecular and pyriform sinus residue compared to prior MBS (  12/27/16) due to reduced tongue base retraction and laryngeal elevation; impaired cervical esophageal relaxation resulted in barium stasis above and below the UES and slow transit (not observed during 2/19 MBS). Educated pt re: severity of swallow impairments and the high aspiration risk associated primarily with consistencies thinner than pudding thick. Pt was adamant about consuming a regular diet and thin liquids despite SLP education. Recommend Dys 1 (pureed solids), pudding thick liquids via spoon, and meds crushed. MD notified who stated she will speak with pt and pt's mother. ST will f/u. SLP Visit Diagnosis Dysphagia, pharyngeal phase (R13.13);Dysphagia, pharyngoesophageal phase (R13.14) Attention and concentration deficit following -- Frontal lobe and executive function deficit following -- Impact on safety and function Severe aspiration risk   CHL IP TREATMENT RECOMMENDATION 12/30/2016 Treatment Recommendations Therapy as outlined in treatment plan below   Prognosis 12/30/2016 Prognosis for Safe Diet Advancement Fair Barriers to Reach Goals Severity of deficits Barriers/Prognosis Comment -- CHL IP DIET RECOMMENDATION 12/30/2016 SLP Diet Recommendations Pudding thick liquid;Dysphagia 1 (Puree) solids Liquid Administration via Spoon;No straw Medication Administration Crushed with puree Compensations Slow rate;Small  sips/bites Postural Changes Seated upright at 90 degrees   CHL IP OTHER RECOMMENDATIONS 12/30/2016 Recommended Consults -- Oral Care Recommendations Oral care BID Other Recommendations Order thickener from pharmacy   CHL IP FOLLOW UP RECOMMENDATIONS 12/30/2016 Follow up Recommendations Other (comment)   CHL IP FREQUENCY AND DURATION 12/30/2016 Speech Therapy Frequency (ACUTE ONLY) min 2x/week Treatment Duration 2 weeks      CHL IP ORAL PHASE 12/30/2016 Oral Phase WFL Oral - Pudding Teaspoon -- Oral - Pudding Cup -- Oral - Honey Teaspoon -- Oral - Honey Cup WFL Oral - Nectar Teaspoon -- Oral - Nectar Cup WFL Oral - Nectar Straw -- Oral - Thin Teaspoon -- Oral - Thin Cup -- Oral - Thin Straw -- Oral - Puree WFL Oral - Mech Soft -- Oral - Regular -- Oral - Multi-Consistency -- Oral - Pill -- Oral Phase - Comment --  CHL IP PHARYNGEAL PHASE 12/30/2016 Pharyngeal Phase Impaired Pharyngeal- Pudding Teaspoon -- Pharyngeal -- Pharyngeal- Pudding Cup -- Pharyngeal -- Pharyngeal- Honey Teaspoon NT Pharyngeal -- Pharyngeal- Honey Cup Delayed swallow initiation-vallecula;Delayed swallow initiation-pyriform sinuses;Penetration/Aspiration during swallow;Pharyngeal residue - valleculae;Pharyngeal residue - pyriform;Pharyngeal residue - cp segment;Reduced tongue base retraction;Reduced laryngeal elevation Pharyngeal Material enters airway, remains ABOVE vocal cords then ejected out;Material enters airway, passes BELOW cords without attempt by patient to eject out (silent aspiration) Pharyngeal- Nectar Teaspoon NT Pharyngeal -- Pharyngeal- Nectar Cup Delayed swallow initiation-vallecula;Delayed swallow initiation-pyriform sinuses;Penetration/Aspiration during swallow;Pharyngeal residue - valleculae;Pharyngeal residue - pyriform;Pharyngeal residue - cp segment;Reduced tongue base retraction Pharyngeal Material enters airway, passes BELOW cords without attempt by patient to eject out (silent aspiration) Pharyngeal- Nectar Straw --  Pharyngeal -- Pharyngeal- Thin Teaspoon -- Pharyngeal -- Pharyngeal- Thin Cup -- Pharyngeal -- Pharyngeal- Thin Straw -- Pharyngeal -- Pharyngeal- Puree Pharyngeal residue - valleculae;Pharyngeal residue - pyriform;Reduced tongue base retraction;Reduced laryngeal elevation Pharyngeal -- Pharyngeal- Mechanical Soft -- Pharyngeal -- Pharyngeal- Regular -- Pharyngeal -- Pharyngeal- Multi-consistency -- Pharyngeal -- Pharyngeal- Pill -- Pharyngeal -- Pharyngeal Comment --  CHL IP CERVICAL ESOPHAGEAL PHASE 12/30/2016 Cervical Esophageal Phase Impaired Pudding Teaspoon -- Pudding Cup -- Honey Teaspoon -- Honey Cup Reduced cricopharyngeal relaxation Nectar Teaspoon -- Nectar Cup Reduced cricopharyngeal relaxation Nectar Straw -- Thin Teaspoon -- Thin Cup -- Thin Straw -- Puree Reduced cricopharyngeal relaxation Mechanical Soft -- Regular -- Multi-consistency -- Pill -- Cervical Esophageal Comment -- No flowsheet data found. [place] Siren 12/30/2016, 3:15  PM  Orbie Pyo Litaker M.Ed CCC-SLP Pager [phone]               Medications:  Scheduled: . aspirin  325 mg Oral Daily  . chlorhexidine gluconate (MEDLINE KIT)  15 mL Mouth Rinse BID  . clonazePAM  1 mg Oral TID  . darunavir-cobicistat  1 tablet Oral Q breakfast  . dolutegravir  50 mg Oral Daily  . emtricitabine-tenofovir AF  1 tablet Oral Daily  . gabapentin  600 mg Oral TID  . heparin  5,000 Units Subcutaneous Q8H  . insulin aspart  0-15 Units Subcutaneous Q4H  . insulin glargine  18 Units Subcutaneous BID  . lactulose  10 g Oral BID  . mouth rinse  15 mL Mouth Rinse QID  . OLANZapine  5 mg Oral QHS  . pantoprazole sodium  40 mg Oral Daily  . scopolamine  1 patch Transdermal Q72H  . sulfamethoxazole-trimethoprim  20 mL Oral Daily   Continuous: . sodium chloride 75 mL/hr at 01/01/17 0531  . feeding supplement (JEVITY 1.2 CAL) Stopped (12/24/16 1832)   KZL:[id number], chlorproMAZINE (THORAZINE) IV, docusate,  guaiFENesin-dextromethorphan, haloperidol lactate, hydrALAZINE, oxyCODONE, RESOURCE THICKENUP CLEAR, sennosides      DVT Prophylaxis: Subcutaneous heparin    Code Status: Full code  Family Communication: No family at bedside  Disposition Plan: not stable for discharge     LOS: 31 days   [facility] Pager [phone] 01/01/2017, 9:32 AM  If 7PM-7AM, please contact night-coverage at [email or web address], password [facility]

## 2017-01-01 NOTE — Progress Notes (Signed)
Daily Progress Note   Patient Name: Lawrence Kane       Date: 01/01/2017 DOB: [birth year]  Age: 51 y.o. MRN#: [id number] Attending Physician: Lawrence Kane Primary Care Physician: No primary care provider on file. Admit Date: 12/01/2016  Reason for Consultation/Follow-up: Establishing goals of care  Subjective: Paged by RN to see patient at attending's request. [name], sister and 2 nieces are present in the room as well as attending. Dr. Orson Kane disease progression and multiple medical problems and attempted to elicit further input regarding treatment plan going forward as patient has been deemed to lack capacity by psychiatry. I met with patient's family outside of the room to help them process this information as well as a plan of how to move forward in helping Lawrence Kane. Specific areas addressed at length was underlying HIV infection and how this is driving a lot of the symptoms they are now seen. We also discussed setting limits on care versus withdrawal of care; still continuing with antibiotics but not pursuing CPR, defibrillation or ventilation  Length of Stay: 31  Current Medications: Scheduled Meds:  . aspirin  325 mg Oral Daily  . chlorhexidine gluconate (MEDLINE KIT)  15 mL Mouth Rinse BID  . clonazePAM  0.5 mg Oral TID  . darunavir-cobicistat  1 tablet Oral Q breakfast  . dolutegravir  50 mg Oral Daily  . emtricitabine-tenofovir AF  1 tablet Oral Daily  . gabapentin  200 mg Oral TID  . heparin  5,000 Units Subcutaneous Q8H  . insulin aspart  0-15 Units Subcutaneous Q4H  . insulin glargine  18 Units Subcutaneous BID  . lactulose  10 g Oral BID  . mouth rinse  15 mL Mouth Rinse QID  . OLANZapine  5 mg Oral QHS  . pantoprazole sodium  40 mg Oral Daily  .  scopolamine  1 patch Transdermal Q72H  . sulfamethoxazole-trimethoprim  20 mL Oral Daily    Continuous Infusions: . sodium chloride 75 mL/hr at 01/01/17 0531  . feeding supplement (JEVITY 1.2 CAL) Stopped (12/24/16 1832)    PRN Meds: acetaminophen, chlorproMAZINE (THORAZINE) IV, docusate, guaiFENesin-dextromethorphan, haloperidol lactate, hydrALAZINE, oxyCODONE, RESOURCE THICKENUP CLEAR, sennosides  Physical Exam  Constitutional: He appears well-developed.  Ill appearing middle-aged man  Pulmonary/Chest:  Copious upper airway secretions noted after oral intake Lawrence Kane is in place  Genitourinary:  Genitourinary Comments: Foley  Neurological: He is alert.  Patient is confused but is attempting to communicate simple needs  Skin: Skin is warm and dry.  Psychiatric:  Patient not in restraints today. He is following simple commands. No overt agitation. Very limited insight  Nursing note and vitals reviewed.           Vital Signs: BP (!) 132/94 (BP Location: Left Arm)   Pulse 92   Temp 98.8 F (37.1 C)   Resp 20   Ht _0  (1.93 m)   Wt 87.1 kg (192 lb)   SpO2 94%   BMI 23.37 kg/m  SpO2: SpO2: 94 % O2 Device: O2 Device: Not Delivered O2 Flow Rate: O2 Flow Rate (L/min): 6 L/min  Intake/output summary:  Intake/Output Summary (Last 24 hours) at 01/01/17 1532 Last data filed at 01/01/17 1343  Gross per 24 hour  Intake             1680 ml  Output             4576 ml  Net            -2896 ml   LBM: Last BM Date: 01/01/17 Baseline Weight: Weight: 93 kg (205 lb) Most recent weight: Weight: 87.1 kg (192 lb)       Palliative Assessment/Data:    Flowsheet Rows   Flowsheet Row Most Recent Value  Intake Tab  Referral Department  Hospitalist  Unit at Time of Referral  Med/Surg Unit  Palliative Care Primary Diagnosis  Sepsis/Infectious Disease  Date Notified  12/24/16  Palliative Care Type  New Palliative care  Reason for referral  Clarify Goals of Care  Date of Admission   12/01/16  Date first seen by Palliative Care  12/25/16  # of days Palliative referral response time  1 Day(s)  # of days IP prior to Palliative referral  23  Clinical Assessment  Palliative Performance Scale Score  30%  Psychosocial & Spiritual Assessment  Palliative Care Outcomes  Patient/Family meeting held?  Yes  Who was at the meeting?  patient and mother on the phone  Palliative Care Outcomes  Clarified goals of care      Patient Active Problem List   Diagnosis Date Noted  . Adjustment disorder with mixed anxiety and depressed mood 12/30/2016  . Goals of care, counseling/discussion   . Palliative care encounter   . Respiratory distress   . Agitation   . Hypernatremia   . Leukocytosis   . Acute blood loss anemia   . Pain   . Acute respiratory failure with hypoxia ([place])   . Tracheostomy status ([place])   . Hypoxemia   . Muscle weakness (generalized)   . Ileus ([place])   . Abdominal distention   . Transverse myelitis ([place])   . Encounter for orogastric (OG) tube placement   . Somnolence   . Paraplegia ([place])   . Acute pulmonary edema (HCC)   . Neurosyphilis   . Herpesviral meningitis   . Cerebral embolism with cerebral infarction 12/08/2016  . Endotracheally intubated   . Meningoencephalitis   . Acute encephalopathy   . Acute respiratory failure ([place]) 12/02/2016  . Altered mental status   . COPD (chronic obstructive pulmonary disease) ([place]) 12/01/2016  . Substance abuse 12/01/2016  . Genital warts 12/01/2016  . HIV (human immunodeficiency virus infection) ([place]) 12/01/2016  . AIDS (acquired immune deficiency syndrome) ([place]) 12/01/2016  . Noncompliance 12/01/2016  . Bacterial meningitis 12/01/2016  . Hypertensive urgency  12/01/2016  . Septic shock ([place]) 12/01/2016  . Transaminitis 12/01/2016  . Cocaine abuse 12/01/2016  . Open wound 12/01/2016    Palliative Care Assessment & Plan   Patient Profile: Lawrence Faciane Normanis a 51 y.o.malewith history of COPD, HIV,  substance abuse, noncompliance, recent surgery in [place]; who was admitted via [facility] on 12/01/16 with AMS changes and septic shock due to bacterial meningitis. He was intubated for airway protection and started on broad spectrum antibiotics as well as acyclovir for scrotal lesions. LP with high protein, low glucose and high WBC with predominance of eosinophils. ID consulted and questioned parasitic infection. Repeat LP 1/29 showed elevated glucose, elevated WBC with 28% eosinophils. BAL cytology/wet mount negative for malignant cells or parasites. MRI brain done 1/27 due to ongoing decrease in level of responsiveness, decrease in movement of BUE and flaccid BLE. MRI brain reviewed, showing small infarcts in cerebellum and cortex. ?due to vasospasms or central/embolic disease and generalized sinuitis/mastoidits. Neurology questioned HSV vasculopathy and recommended MRA brain for work up. MRA brain revealed irregular R-ICA no large vessel stenosis and beaded appearance of right cervical ICA compatible with fibromuscular dysplasia. Infarcts felt to be due to vasculitis/vasospams secondary to meningoencephalitis. MRI total spine was negative for abscess or hematoma, generalized meningitis and radiculitis and multifocal dorsal thoracic cord signal abnormality from myelitis. 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 He has had loose stools due to partial SBO v/s ileus. Agitation improving. He has completed treatment for ESBL K-pneumoniae in BAL. He was unable to tolerate vent wean requiring tracheostomy 2/5. He has been weaned to ATC for past 2 days but continues to have copious purulent secretions via trach as well as restraints due to poor safety. Therapy ongoing and CIR recommended for follow up therapy.   . Subsequently, patient developed an ileus. NG tube was placed to intermittent suction.   Patient has pulled out his NG tube multiple times this admission. Still unable to swallow. Speech therapy evaluating   patient on a daily basis   Recommendations/Plan:  Continue with planned palliative care meeting on 01/02/2017 at 1 PM with palliative care provider Charlynn Court, NP. [name] and sister and possibly 2 nieces planned to be present for the meeting. They were present for meeting today with Dr. Allyson Sabal and myself  Encouraged family to think in terms of limitations on care such as no CPR, defibrillation, vent. Patient seems to have some capacity to answer some simple questions and I also encouraged family to speak with him regarding his wishes which they plan to do today  His mother would be his surrogate decision maker and she is verbalizing that she is not seeing  quality of life for Vu. They are however all making decisions as a family  Goals of Care and Additional Recommendations:  Limitations on Scope of Treatment: Full Scope Treatment  Code Status:    Code Status Orders        Start     Ordered   12/01/16 2304  Full code  Continuous     12/01/16 2312    Code Status History    Date Active Date Inactive Code Status Order ID Comments User Context   This patient has a current code status but no historical code status.       Prognosis:   < 6 months in the setting of HIV infection  (diagnosed  25 years ago), recurrent aspiration pneumonia debility and encephalopathy  Discharge Planning:  To Be Determined  Care plan was discussed with Dr. Allyson Sabal and RN  Thank you for allowing the Palliative Medicine Team to assist in the care of this patient.   Time In: 1230 Time Out: 1330 Total Time 60 min Prolonged Time Billed  no       Greater than 50%  of this time was spent counseling and coordinating care related to the above assessment and plan.  Dory Horn, NP  Please contact Palliative Medicine Team phone at [phone] for questions and concerns.

## 2017-01-02 DIAGNOSIS — G373 Acute transverse myelitis in demyelinating disease of central nervous system: Secondary | ICD-10-CM

## 2017-01-02 DIAGNOSIS — B003 Herpesviral meningitis: Secondary | ICD-10-CM

## 2017-01-02 LAB — COMPREHENSIVE METABOLIC PANEL
ALT: 62 U/L (ref 17–63)
AST: 47 U/L — AB (ref 15–41)
Albumin: 2 g/dL — ABNORMAL LOW (ref 3.5–5.0)
Alkaline Phosphatase: 81 U/L (ref 38–126)
Anion gap: 11 (ref 5–15)
BILIRUBIN TOTAL: 0.6 mg/dL (ref 0.3–1.2)
BUN: 20 mg/dL (ref 6–20)
CHLORIDE: 97 mmol/L — AB (ref 101–111)
CO2: 28 mmol/L (ref 22–32)
Calcium: 8.5 mg/dL — ABNORMAL LOW (ref 8.9–10.3)
Creatinine, Ser: 1.31 mg/dL — ABNORMAL HIGH (ref 0.61–1.24)
Glucose, Bld: 72 mg/dL (ref 65–99)
Potassium: 4.3 mmol/L (ref 3.5–5.1)
Sodium: 136 mmol/L (ref 135–145)
TOTAL PROTEIN: 6.6 g/dL (ref 6.5–8.1)

## 2017-01-02 LAB — CBC
HEMATOCRIT: 35.7 % — AB (ref 39.0–52.0)
Hemoglobin: 11.5 g/dL — ABNORMAL LOW (ref 13.0–17.0)
MCH: 30.4 pg (ref 26.0–34.0)
MCHC: 32.2 g/dL (ref 30.0–36.0)
MCV: 94.4 fL (ref 78.0–100.0)
PLATELETS: 203 10*3/uL (ref 150–400)
RBC: 3.78 MIL/uL — AB (ref 4.22–5.81)
RDW: 17.9 % — AB (ref 11.5–15.5)
WBC: 7.9 10*3/uL (ref 4.0–10.5)

## 2017-01-02 LAB — GLUCOSE, CAPILLARY
GLUCOSE-CAPILLARY: 93 mg/dL (ref 65–99)
GLUCOSE-CAPILLARY: 107 mg/dL — AB (ref 65–99)
GLUCOSE-CAPILLARY: 99 mg/dL (ref 65–99)
GLUCOSE-CAPILLARY: 96 mg/dL (ref 65–99)
Glucose-Capillary: 110 mg/dL — ABNORMAL HIGH (ref 65–99)
Glucose-Capillary: 70 mg/dL (ref 65–99)
Glucose-Capillary: 107 mg/dL — ABNORMAL HIGH (ref 65–99)

## 2017-01-02 MED ORDER — PHENOL 1.4 % MT LIQD: 1.0000 | OROMUCOSAL | Status: DC | PRN

## 2017-01-02 MED ORDER — RESOURCE THICKENUP CLEAR PO POWD: ORAL | Status: DC | PRN

## 2017-01-02 NOTE — Progress Notes (Signed)
Called to bedside. Pt decanulated himself. Pt alert and in no distress.  Pt spo2 93% on RA.  Reinserted trach good color change with ETCO2.  BBS equal.  SPO2 93 % on RA.  Pt has good strong cough.  RT to monitor.

## 2017-01-02 NOTE — Progress Notes (Signed)
PROGRESS NOTE    Lawrence Kane  MWN:[id number] DOB: 12-Mar-1966 DOA: 12/01/2016 PCP: No primary care provider on file.   Brief Narrative: 51 y.o.malewith history of COPD, HIV, substance abuse, noncompliance, recent surgery in [place]; who was admitted via [facility] on 12/01/16 with AMS changes and septic shock due to bacterial meningitis. He was intubated for airway protection and started on broad spectrum antibiotics as well as acyclovir for scrotal lesions. LP with high protein, low glucose and high WBC with predominance of eosinophils. ID consulted and questioned parasitic infection. Repeat LP 1/29 showed elevated glucose, elevated WBC with 28% eosinophils. BAL cytology/wet mount negative for malignant cells or parasites. MRI brain done 1/27 due to ongoing decrease in level of responsiveness, decrease in movement of BUE and flaccid BLE. MRI brain reviewed, showing small infarcts in cerebellum and cortex. ?due to vasospasms or central/embolic disease and generalized sinuitis/mastoidits. Neurology questioned HSV vasculopathy and recommended MRA brain for work up. MRA brain revealed irregular R-ICA no large vessel stenosis and beaded appearance of right cervical ICA compatible with fibromuscular dysplasia. Infarcts felt to be due to vasculitis/vasospams secondary to meningoencephalitis. MRI total spine was negative for abscess or hematoma, generalized meningitis and radiculitis and multifocal dorsal thoracic cord signal abnormality from myelitis. 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 He has had loose stools due to partial SBO v/s ileus. Agitation improving. He has completed treatment for ESBL K-pneumoniae in BAL. He was weaned to tracheostomy 2/5. Developed ileus which is now improved. Currently on dysphagia diet.   Assessment & Plan:  # Acute hypoxic respiratory failure, tracheostomy on  February 5:  - Patient was treated for ESBL Klebsiella HCAP, sepsis. Completed antibiotics on 12/30/16. Patient was on the ventilator for prolonged time underwent tracheostomy placement. Currently on room air. -Pulmonary is following. -Respiratory therapy consult appreciated.  #Scrotal wound, history of genital warts: Treated with IV fluconazole and evaluated by urologist. Continue local wound care.  #HSV meningitis with transverse myelitis: As per medical record, HSV positive and CSF from [facility]. Evaluated by infectious disease. The patient completed 21 days course of acyclovir. Currently on Bactrim for PCP prophylaxis. -Also treated with high-dose steroid for 5 days and then tapering dose of steroid. Evaluated by neurologist. -PT, OT therapy.  #Ileus improved: On dysphagia diet.  #Diabetes type 2: Continue current insulin regimen. Monitor blood sugar level.  #Acute metabolic encephalopathy: In the setting of meningitis. Patient with intermittent agitation. This morning he was stable. Continue to provide supportive care.  #HIV infection, hepatitis C virus antibody positive, chronic hepatitis B infection: Currently on antiviral medication. Outpatient follow-up with infectious disease  #Goals of care discussion: Palliative care meeting with the patient and family's today. Patient with prolonged hospitalization and probably poor prognosis.  Principal Problem:   Adjustment disorder with mixed anxiety and depressed mood Active Problems:   COPD (chronic obstructive pulmonary disease) (HCC)   Substance abuse   Genital warts   HIV (human immunodeficiency virus infection) ([place])   AIDS (acquired immune deficiency syndrome) ([place])   Noncompliance   Bacterial meningitis   Hypertensive urgency   Septic shock (HCC)   Transaminitis   Cocaine abuse   Open wound   Acute respiratory failure (HCC)   Altered mental status   Acute encephalopathy   Cerebral embolism with cerebral  infarction   Endotracheally intubated   Meningoencephalitis   Neurosyphilis  Herpesviral meningitis   Paraplegia ([place])   Acute pulmonary edema (HCC)   Transverse myelitis ([place])   Encounter for orogastric (OG) tube placement   Somnolence   Abdominal distention   Ileus (HCC)   Muscle weakness (generalized)   Acute respiratory failure with hypoxia (HCC)   Tracheostomy status (HCC)   Hypoxemia   Respiratory distress   Agitation   Hypernatremia   Leukocytosis   Acute blood loss anemia   Pain   Palliative care encounter   Goals of care, counseling/discussion  DVT prophylaxis: Heparin subcutaneous Code Status: DO NOT RESUSCITATE Family Communication: No family present at bedside Disposition Plan: Likely discharge to SNF in 1-2 days.  STUDIES: 1/24 CXR >> ATX vs LLL infiltrate 1/25 CT head >> neg for acute intracranial process 1/25 LP cytology >> no malignant cells 1/25 LP >> yellow, cloudy, glucose 20, total protein >600, RBC 114>49, WBC 970, 36% neutrophil, 20% lymphs, 19% eos 1/28 BAL cell count >> 266 WBC, eos 1 1/28 BAL cytology >> no malignant cells 1/27 BAL for wet mount> neg for parasites 1/27 MRI brain >> small acute infarcts in cerebellum and cerebral cortex, sucal signal 1/29 LP >> Glucose 82, protein >600, WBC 600>885, eosinophils 28% 1/29 Echo >> LV EF 60-65%, indeterminent diastolic function, wall motion normal 1/30 EEG >> diffuse slowing and background suppression, no seizures 2/3 MRI thoracic/lumbar/cervical >> (-) abscess. Meningitis. R/O CMV   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 1/24 Cryptococcal [name]): negative 1/24 CSF [facility]): NGTD 1/22 scrotal wound cx [name]): MRSA Toxoplasma [name]): Negative 1/25BCx x2 : NGTD 1/25 HCV ab + 1/24 Trach asp 1/24: NGTD 1/24MRSA PCR: MRSA pos 1/25 HIV quant: 80, CD4 10 1/25 CSF AFB: Smear neg, >> 1/25 CSF fungal cx: NGTD 1/25 CSF crypto ag: neg 1/25  CSF gram stain no organisms, cx: NGTD 1/27 strongyloides ab >> negative 1/27 coccidiomycosis ab >> 1/28 BAL AFB smear/cx> 1/28 BAL Fungus cx >> 1/28 BAL gram stain/Cx: 40K ESBL Klebsiella pneumoniae 1/28 BAL pneumocystis smear: Neg 1/27 O&P >> 1/29 CSF Cx >> no organisms on gram stain, >> NGTD 2/04 CSF CMV >>not done?   ANTIBIOTICS: Rocephin 1/24>>1/30 Vancomycin 1/24>>1/30; 2/2 > Acyclovir 1/24>> 1/26, 1/27>> Ampicillin 1/24>> 1/27 Bactrim M/W/F for PCP prophylaxis Meropenem 1/30>>2/1; 2/2 > Pen G 2/1 >> 2/2 Cipro 2/1 >> 2/2 Meropenem/vancomycin-2/15-  SIGNIFICANT EVENTS: 1/24 Transferred from [facility] 1/25 Underwent LP here 2/05 Trach 2/06 To ATC 2/8  patient developed ileus  LINES/TUBES: L IJ CVL 1/24 >> ETT 1/24 >> 2/5 Foley 1/24 >> OGT 1/24 >> 2/5 Trach (df) 2/5 >>  Consultants:  Infectious disease. Critical care medicine. Neurology. Psychiatry  Subjective: Patient was seen and examined at bedside. Patient is alert awake and following commands. Denied chest pain, shortness of breath, nausea or vomiting. He wants regular food.  Objective: Vitals:   01/02/17 0503 01/02/17 0802 01/02/17 1118 01/02/17 1501  BP: 122/82     Pulse: 93 92 95 90  Resp: _0 Temp: 99.4 F (37.4 C)     TempSrc: Oral     SpO2: 93% 95% 94% 93%  Weight:      Height:        Intake/Output Summary (Last 24 hours) at 01/02/17 1541 Last data filed at 01/02/17 0753  Gross per 24 hour  Intake          4108.75 ml  Output  1601 ml  Net          2507.75 ml   Filed Weights   12/31/16 0557 01/01/17 0527 01/02/17 0500  Weight: 91.6 kg (201 lb 15.1 oz) 87.1 kg (192 lb) 82.3 kg (181 lb 8 oz)    Examination:  General exam: Appears calm and comfortable  Respiratory system: Clear to auscultation. Respiratory effort normal.  Cardiovascular system: S1 & S2 heard, RRR.  No pedal edema. Gastrointestinal system: Abdomen is nondistended, soft and nontender.  Normal bowel sounds heard. Central nervous system: Alert awake and following commands Psychiatry: Judgement and insight appear impaired.     Data Reviewed: I have personally reviewed following labs and imaging studies  CBC:  Recent Labs Lab 12/27/16 0511 12/31/16 0546 01/02/17 0600  WBC 7.6 12.4* 7.9  HGB 12.1* 12.4* 11.5*  HCT 38.0* 38.7* 35.7*  MCV 94.8 95.3 94.4  PLT 289 239 203   Basic Metabolic Panel:  Recent Labs Lab 12/27/16 0511 12/30/16 0046 12/31/16 0546 01/02/17 0600  NA 136 134* 136 136  K 4.0 4.8 4.2 4.3  CL 103 102 103 97*  CO2 24 23 26 28  GLUCOSE 153* 319* 94 72  BUN 15 34* 27* 20  CREATININE 0.61 1.25* 1.18 1.31*  CALCIUM 8.3* 8.8* 8.8* 8.5*   GFR: Estimated Creatinine Clearance: 77.7 mL/min (by C-G formula based on SCr of 1.31 mg/dL (H)). Liver Function Tests:  Recent Labs Lab 12/31/16 0546 01/02/17 0600  AST 33 47*  ALT 62 62  ALKPHOS 87 81  BILITOT 0.6 0.6  PROT 6.8 6.6  ALBUMIN 2.1* 2.0*   No results for input(s): LIPASE, AMYLASE in the last 168 hours. No results for input(s): AMMONIA in the last 168 hours. Coagulation Profile: No results for input(s): INR, PROTIME in the last 168 hours. Cardiac Enzymes:  Recent Labs Lab 12/26/16 1804  TROPONINI <0.03   BNP (last 3 results) No results for input(s): PROBNP in the last 8760 hours. HbA1C: No results for input(s): HGBA1C in the last 72 hours. CBG:  Recent Labs Lab 01/01/17 2011 01/02/17 0127 01/02/17 0459 01/02/17 0759 01/02/17 1152  GLUCAP 110* 110* 93 70 107*   Lipid Profile: No results for input(s): CHOL, HDL, LDLCALC, TRIG, CHOLHDL, LDLDIRECT in the last 72 hours. Thyroid Function Tests: No results for input(s): TSH, T4TOTAL, FREET4, T3FREE, THYROIDAB in the last 72 hours. Anemia Panel: No results for input(s): VITAMINB12, FOLATE, FERRITIN, TIBC, IRON, RETICCTPCT in the last 72 hours. Sepsis Labs: No results for input(s): PROCALCITON, LATICACIDVEN in the last  168 hours.  No results found for this or any previous visit (from the past 240 hour(s)).       Radiology Studies: No results found.      Scheduled Meds: . aspirin  325 mg Oral Daily  . chlorhexidine gluconate (MEDLINE KIT)  15 mL Mouth Rinse BID  . clonazePAM  0.5 mg Oral TID  . darunavir-cobicistat  1 tablet Oral Q breakfast  . dolutegravir  50 mg Oral Daily  . emtricitabine-tenofovir AF  1 tablet Oral Daily  . gabapentin  200 mg Oral TID  . heparin  5,000 Units Subcutaneous Q8H  . insulin aspart  0-15 Units Subcutaneous Q4H  . insulin glargine  18 Units Subcutaneous BID  . lactulose  10 g Oral BID  . mouth rinse  15 mL Mouth Rinse QID  . OLANZapine  5 mg Oral QHS  . pantoprazole sodium  40 mg Oral Daily  . scopolamine  1 patch Transdermal Q72H  .   sulfamethoxazole-trimethoprim  20 mL Oral Daily   Continuous Infusions: . sodium chloride 75 mL/hr at 01/02/17 0629  . feeding supplement (JEVITY 1.2 CAL) Stopped (12/24/16 1832)     LOS: 32 days     Prasad , MD [facility] Pager [phone]  If 7PM-7AM, please contact night-coverage [email or web address] Password [email or web address] 01/02/2017, 3:41 PM   

## 2017-01-02 NOTE — Progress Notes (Signed)
Patient's CBG 70 gave an ensure

## 2017-01-02 NOTE — Progress Notes (Signed)
Daily Progress Note   Patient Name: Lawrence Kane       Date: 01/02/2017 DOB: 1965/11/25  Age: 51 y.o. MRN#: [id number] Attending Physician: Rosita Fire, MD Primary Care Physician: No primary care provider on file. Admit Date: 12/01/2016  Reason for Consultation/Follow-up: Non pain symptom management, Pain control and Psychosocial/spiritual support  Subjective: Lawrence Kane is alert in bed. His family was present, which included his mother, sister, and two neices. Lawrence Kane primary complaint was feeling very hungry and wanting regular food. He also complained of a sore throat.  Length of Stay: 32  Current Medications: Scheduled Meds:  . aspirin  325 mg Oral Daily  . chlorhexidine gluconate (MEDLINE KIT)  15 mL Mouth Rinse BID  . clonazePAM  0.5 mg Oral TID  . darunavir-cobicistat  1 tablet Oral Q breakfast  . dolutegravir  50 mg Oral Daily  . emtricitabine-tenofovir AF  1 tablet Oral Daily  . gabapentin  200 mg Oral TID  . heparin  5,000 Units Subcutaneous Q8H  . insulin aspart  0-15 Units Subcutaneous Q4H  . insulin glargine  18 Units Subcutaneous BID  . lactulose  10 g Oral BID  . mouth rinse  15 mL Mouth Rinse QID  . OLANZapine  5 mg Oral QHS  . pantoprazole sodium  40 mg Oral Daily  . scopolamine  1 patch Transdermal Q72H  . sulfamethoxazole-trimethoprim  20 mL Oral Daily    Continuous Infusions: . sodium chloride 75 mL/hr at 01/02/17 0629  . feeding supplement (JEVITY 1.2 CAL) Stopped (12/24/16 1832)    PRN Meds: acetaminophen, chlorproMAZINE (THORAZINE) IV, docusate, guaiFENesin-dextromethorphan, haloperidol lactate, hydrALAZINE, oxyCODONE, RESOURCE THICKENUP CLEAR, RESOURCE THICKENUP CLEAR, sennosides  Physical Exam  Constitutional: He is oriented to person, place, and time. He appears well-developed and  well-nourished.  HENT:  Left ear with healing skin tear, healing lesions on lips. Tongue now with brown coating.   Eyes: EOM are normal.  Neck: Normal range of motion. Neck supple.  Trach present on room air  Cardiovascular: Normal rate.   Pulmonary/Chest: Effort normal. He has no wheezes. He has rhonchi (throughout).  Trach in place, capped. Thick secretions around trach site. Voice back at a whisper.  Abdominal: Soft. Bowel sounds are normal. He exhibits no distension. There is no tenderness.  Musculoskeletal:  Generalized weakness. Full movement of BUE. No movement in BLE.  Neurological: He is alert and oriented to person, place, and time.  Orientation a little questionable. Answers all orientation questions, remembers my name and our conversations over the past few days. Does not seem to hear/understand rationale for medical interventions and restrictions.  Skin: Skin is warm and dry.  Known skin breakdown on scrotum, however he refused evaluation  Psychiatric: His affect is angry and blunt. His speech is slurred (whisper). He is agitated. He expresses impulsivity and inappropriate judgment. He exhibits abnormal recent memory.  Fixated on leaving the hospital, feeling starved, and wanting regular food.            Vital Signs: BP 122/82 (BP Location: Left Leg)   Pulse 95   Temp 99.4 F (37.4 C) (Oral)   Resp 20   Ht 6' 4"  (1.93 m)   Wt  82.3 kg (181 lb 8 oz)   SpO2 94%   BMI 22.09 kg/m  SpO2: SpO2: 94 % O2 Device: O2 Device: Not Delivered O2 Flow Rate: O2 Flow Rate (L/min): 6 L/min  Intake/output summary:   Intake/Output Summary (Last 24 hours) at 01/02/17 1410 Last data filed at 01/02/17 0753  Gross per 24 hour  Intake          4108.75 ml  Output             1601 ml  Net          2507.75 ml   LBM: Last BM Date: 01/02/17 Baseline Weight: Weight: 93 kg (205 lb) Most recent weight: Weight: 82.3 kg (181 lb 8 oz)       Palliative Assessment/Data: PPS 50%   Flowsheet  Rows   Flowsheet Row Most Recent Value  Intake Tab  Referral Department  Hospitalist  Unit at Time of Referral  Med/Surg Unit  Palliative Care Primary Diagnosis  Sepsis/Infectious Disease  Date Notified  12/24/16  Palliative Care Type  New Palliative care  Reason for referral  Clarify Goals of Care  Date of Admission  12/01/16  Date first seen by Palliative Care  12/25/16  # of days Palliative referral response time  1 Day(s)  # of days IP prior to Palliative referral  23  Clinical Assessment  Palliative Performance Scale Score  30%  Psychosocial & Spiritual Assessment  Palliative Care Outcomes  Patient/Family meeting held?  Yes  Who was at the meeting?  patient and mother on the phone  Palliative Care Outcomes  Clarified goals of care      Patient Active Problem List   Diagnosis Date Noted  . Adjustment disorder with mixed anxiety and depressed mood 12/30/2016  . Goals of care, counseling/discussion   . Palliative care encounter   . Respiratory distress   . Agitation   . Hypernatremia   . Leukocytosis   . Acute blood loss anemia   . Pain   . Acute respiratory failure with hypoxia ([place])   . Tracheostomy status ([place])   . Hypoxemia   . Muscle weakness (generalized)   . Ileus ([place])   . Abdominal distention   . Transverse myelitis ([place])   . Encounter for orogastric (OG) tube placement   . Somnolence   . Paraplegia ([place])   . Acute pulmonary edema ([facility])   . Neurosyphilis   . Herpesviral meningitis   . Cerebral embolism with cerebral infarction 12/08/2016  . Endotracheally intubated   . Meningoencephalitis   . Acute encephalopathy   . Acute respiratory failure ([place]) 12/02/2016  . Altered mental status   . COPD (chronic obstructive pulmonary disease) ([place]) 12/01/2016  . Substance abuse 12/01/2016  . Genital warts 12/01/2016  . HIV (human immunodeficiency virus infection) ([place]) 12/01/2016  . AIDS (acquired immune deficiency syndrome) ([place]) 12/01/2016  . Noncompliance  12/01/2016  . Bacterial meningitis 12/01/2016  . Hypertensive urgency 12/01/2016  . Septic shock ([place]) 12/01/2016  . Transaminitis 12/01/2016  . Cocaine abuse 12/01/2016  . Open wound 12/01/2016    Palliative Care Assessment & Plan   HPI: 51 y.o. male  with past medical history of HIV/AIDS, chronic Hep B, COPD, Osteoporosis, genital warts, and cocaine use who originally presented to [facility] with AMS and agitation. He was intubated for airway protection and transferred to [facility]. He was admitted here on 12/01/2016. 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 He was intubated for acute respiratory failure, and a tracheostomy was placed. He did develop an ileus, however repeatedly pulled out his NG tube. Swallow evaluation pending.   Assessment: Please see my prior notes for extensive discussion and meetings. Today, I met with Lawrence Kane in the company of his mother, sister, and two nieces. Omarian was deemed not to have medical decision making capacity, which results in his mother being his Media planner. Thankfully, he wants to be included in conversations but entirely defers to his mother to make decisions (and will not provide his opinion, either).   I provided an extensive review of his medical history for his family who had not yet been fully updated. I then explained the current issues he was still struggling with: swallowing and risk for aspiration, paralysis of his lower legs, ongoing confusion, and treatment noncompliance with disregard for safe eating instructions, puling out his trach, and refusing physical therapy. I also explained the difficulty in finding a rehab facility based on the above factors. After discussing these issues at length, we talked about code status. His mother was able to decided that Lawrence Kane should not  be resuscitated in the event he has cardiac or respiratory failure. She agreed: No ACLS medications, BiPAP, CPR, defibrillation, or intubation,  In terms of goals of care, this was a harder discussion. I reviewed two paths forward. The first would be ongoing interventions to promote health and longevity. This would include ongoing diet restriction, working with PT/OT/Speech, rehab, and recurrent hospitalizations when an infection occurs (which I explained was highly likely given his aspiration risk, general debility, and significant comorbidities). The other path would be comfort focused and would focus on quality of life, with the likelihood of shorter life. This path would allow him to eat and drink an unrestricted diet, he could decided the degree of activity he wanted to participate in and, when an infection occurs, his symptoms would be treated but he would remain out of the hospital (which has been very verbal about wanting). After a lot of deliberation his family wanted to gather a bit more information (specifically another swallow evaluation and follow-up spine imaging), and then wanted to revisit these options again.  Of note, his mother's biggest desire is for him to be near her in [place]. Whatever path would enable him to be there is likely what she would choose. I discussed this with social work, and asked that they investigate the options of SNF/Rehab in [place], as well as Hospices in [place], and the cost if the family would need to transport him there.   Recommendations/Plan:  DNR, continue full scope interventions  Pt due for follow-up spinal imaging (it has been 2 weeks since Neuro made their recommendation on 2/11)  Paged speech to request a repeat swallow eval in the next few days; will need to f/u with them on Monday  Social Work as to [employer] venues for getting pt to [facility], either with SNF/Rehab or Hospice  PMT to follow-up with mom once information has been  obtained  Symptom management Patients pain and anxiety regimen needs to be simplified with ongoing reduction in controlled substance use. Would avoid IV pain medication, as pt cleared for oral intake.  Pain:   Oxycodone 58m changed to q6H PRN  Continue Gabapentin 6060mTID  Anxiety:  Clonazepam to 66m74mID  Zyprexa 5mg33mS  Goals of Care and Additional Recommendations:  Limitations on Scope of Treatment: Full  Scope Treatment  Code Status:  DNR  Prognosis:   Unable to determine  Discharge Planning:  To Be Determined  Care plan was discussed with pt, family, Social Work.   Thank you for allowing the Palliative Medicine Team to assist in the care of this patient.  Time in: 1300 Time Out: 1430 Total time: 105 minutes    Greater than 50%  of this time was spent counseling and coordinating care related to the above assessment and plan.  Charlynn Court, NP Palliative Medicine Team [phone] pager (7a-5p) Team Phone # [phone]

## 2017-01-02 NOTE — Progress Notes (Signed)
RT called to pt bedside.  Pt decanulated himself AGAIN.  RN at bedside.  PT SPO2 94% on RA.  Pt in no distress.  Trach replaced. SPO2 remains 94% on RA.  BBS rhonchi.  Pt has strong productive cough.  Good color change with ETCO2.  Terie PurserMorgan Blake assisted me with trach replacement.

## 2017-01-02 NOTE — Progress Notes (Signed)
Called by NT to alert me that patient had pulled trach out. Patient in no distress, RT notified.

## 2017-01-03 DIAGNOSIS — R0902 Hypoxemia: Secondary | ICD-10-CM

## 2017-01-03 DIAGNOSIS — R1310 Dysphagia, unspecified: Secondary | ICD-10-CM

## 2017-01-03 DIAGNOSIS — Z7189 Other specified counseling: Secondary | ICD-10-CM

## 2017-01-03 LAB — GLUCOSE, CAPILLARY
GLUCOSE-CAPILLARY: 119 mg/dL — AB (ref 65–99)
GLUCOSE-CAPILLARY: 121 mg/dL — AB (ref 65–99)
GLUCOSE-CAPILLARY: 123 mg/dL — AB (ref 65–99)
Glucose-Capillary: 163 mg/dL — ABNORMAL HIGH (ref 65–99)
Glucose-Capillary: 82 mg/dL (ref 65–99)
Glucose-Capillary: 74 mg/dL (ref 65–99)

## 2017-01-03 MED ORDER — HYDROMORPHONE HCL 2 MG/ML IJ SOLN
0.5000 mg | INTRAMUSCULAR | Status: DC | PRN
  Administered 2017-01-03 – 2017-01-04 (×3): 0.5 mg via INTRAVENOUS
  Filled 2017-01-03 (×3): qty 1

## 2017-01-03 MED ORDER — GABAPENTIN 300 MG PO CAPS
300.0000 mg | ORAL_CAPSULE | Freq: Three times a day (TID) | ORAL | Status: DC
  Administered 2017-01-03 – 2017-01-04 (×3): 300 mg via ORAL
  Filled 2017-01-03 (×3): qty 1

## 2017-01-03 MED ORDER — HYDROMORPHONE HCL 2 MG/ML IJ SOLN: 0.5000 mg | INTRAMUSCULAR | Status: DC | PRN

## 2017-01-03 NOTE — Progress Notes (Signed)
RT called to replace trach AGAIN. When I arrived, patient had obturator in his stoma. #6 CFS shiley replaced and re-capped. Positive ETCO2 color change.

## 2017-01-03 NOTE — Procedures (Signed)
Tracheostomy Change Note  Patient Details:   Name: Patrick NorthDarren E Nesbit DOB: 1966-01-25 MRN: [id number]    Airway Documentation:     Evaluation  O2 sats: stable throughout Complications: No apparent complications Patient did tolerate procedure well. Bilateral Breath Sounds: Clear, Diminished     Pt decannulated. Gauze secured with tape placed over stoma. Pt states he is more comfortable.  RN notified.  Forest BeckerJean S Kele Barthelemy 01/03/2017, 12:09 PM

## 2017-01-03 NOTE — Progress Notes (Signed)
Pt decannulated per MD order.  Pt had been removing his trach several times in the past 24 hours.

## 2017-01-03 NOTE — Progress Notes (Signed)
  Speech Language Pathology  Patient Details Name: Patrick NorthDarren E Coate MRN: [id number] DOB: Jul 10, 1966 Today's Date: 01/03/2017 Time:  -      This SLP spoke with Dr. Ronalee BeltsBhandari today re: pt's swallow status who reported that pt's mom  (decision maker) does not wish for pt to have liberated/comfort care diet until he is in [place]. Pt will continue puree (D1) and pudding thick liquids for now. Ice chips after oral care (in between meals).    GO                Royce MacadamiaLitaker, Jermond Burkemper Willis 01/03/2017, 1:35 PM  Breck CoonsLisa Willis Lonell FaceLitaker M.Ed [employer] Pager [phone]

## 2017-01-03 NOTE — Progress Notes (Addendum)
Physical Therapy Treatment Patient Details Name: Lawrence Kane E Bajorek MRN: [id number] DOB: July 31, 1966 Today's Date: 01/03/2017    History of Present Illness 51 yo [facility] on 1/24 with AMS and intubated. 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 Pt with bil LE flaccid since admission. PMHx:HIV/AIDS, Hep C, Hep B, COPD, cocaine use     PT Comments    Patient slowly progressing due to limited participation, pain and poor awareness of deficits.  Feel he continues to need 24 hour skilled care in STSNF setting.  Willing today to participate, but was unable to receive pain medication so did not want to participate in sitting balance activities.  Both legs remain flaccid and pt not aware of me moving them or of having BM in the bed.  Will continue to follow with goals updated this session.    Follow Up Recommendations  SNF;Supervision/Assistance - 24 hour     Equipment Recommendations  Wheelchair (measurements PT);Wheelchair cushion (measurements PT);Hospital bed    Recommendations for Other Services       Precautions / Restrictions Precautions Precautions: Fall Precaution Comments: Trach collar, and Bil LEs flaccid Restrictions Weight Bearing Restrictions: No    Mobility  Bed Mobility Overal bed mobility: Needs Assistance Bed Mobility: Rolling Rolling: Mod assist;+2 for physical assistance         General bed mobility comments: cues to reach for railing and assist with lower body and LE's; rolled for hygiene due to fecal incontinence, then to place lift pad  Transfers Overall transfer level: Needs assistance               General transfer comment: bed to chair via sky lift  Ambulation/Gait                 Stairs            Wheelchair Mobility    Modified Rankin (Stroke Patients Only) Modified Rankin (Stroke Patients Only) Pre-Morbid  Rankin Score: No symptoms Modified Rankin: Severe disability     Balance Overall balance assessment: Needs assistance   Sitting balance-Leahy Scale: Poor Sitting balance - Comments: did not want to sit at edge of chair due to pain in spine and not able to recieve pain medication at this time; positioned with pillows and reclined in chair due to poor balance                            Cognition Arousal/Alertness: Awake/alert Behavior During Therapy: Agitated Overall Cognitive Status: Impaired/Different from baseline Area of Impairment: Safety/judgement;Attention   Current Attention Level: Sustained     Safety/Judgement: Decreased awareness of safety;Decreased awareness of deficits     General Comments: perseverates on nursing needs and wants IV pain meds, RN reports had earlier and not yet due.  He wants to walk with a walker, but has flaccid paralysis of bilateral LE's    Exercises General Exercises - Lower Extremity Ankle Circles/Pumps: PROM;5 reps;Both;Supine Heel Slides: PROM;5 reps;Both;Supine    General Comments        Pertinent Vitals/Pain Faces Pain Scale: Hurts whole lot Pain Location: spine Pain Descriptors / Indicators: Aching;Grimacing Pain Intervention(s): Monitored during session;Limited activity within patient's tolerance;Patient requesting pain meds-RN notified    Home Living                      Prior Function  PT Goals (current goals can now be found in the care plan section) Acute Rehab PT Goals Time For Goal Achievement: 01/17/17 Potential to Achieve Goals: Fair Progress towards PT goals: Progressing toward goals    Frequency    Min 3X/week      PT Plan Current plan remains appropriate    Co-evaluation             End of Session Equipment Utilized During Treatment: Gait belt Activity Tolerance: Patient limited by pain Patient left: in chair;with call bell/phone within reach;with chair alarm  set Nurse Communication: Need for lift equipment       Time: 1000-1039 PT Time Calculation (min) (ACUTE ONLY): 39 min  Charges:  $Therapeutic Exercise: 8-22 mins $Therapeutic Activity: 23-37 mins                    G Codes:       Elray Mcgregor 2017-01-11, 11:45 AM  Sheran Lawless, PT [phone] 01-11-2017

## 2017-01-03 NOTE — Progress Notes (Signed)
Name: Lawrence Kane MRN: [id number] DOB: 1966-05-13    ADMISSION DATE:  12/01/2016 CONSULTATION DATE: 12/01/2016   REFERRING MD :  [name] Transfer   CHIEF COMPLAINT: S/P Tracheostomy   BRIEF PATIENT DESCRIPTION:  51 year old man with HIV/AIDS (first diagnosed 2003, last CD4 count 245 with viremia of 1,160 in October 2017; on Genovya, Darunavir and Bactrim), Genital Warts, COPD, and cocaine substance abuse who presented to [facility] on 1/24 with altered mental status. Intubated for airway protection. Prolonged vent wean due to ESBL klebsiella HCAP with sepsis, s/p trach 2/5.    SIGNIFICANT EVENTS  1/24  Transferred from [facility] 1/25  Underwent LP here 2/05  Trach 2/06  To ATC 2/08  abd distention, likely ileus 2/12  Abd improved, tol TF, weaning on ATC 2/20 Trach downsized to 6 cuffless/cap trials began  2/21 Patient pulled Trach out > replaced by RT  STUDIES:  1/24 CXR > ATX vs LLL infiltrate 1/25 CT head > neg for acute intracranial process 1/25 LP cytology > no malignant cells 1/25 LP > yellow, cloudy, glucose 20, total protein >600, RBC 114>49, WBC 970, 36% neut, 20% lymphs, 19% eos 1/28 BAL cell count > 266 WBC, eos 1 1/28 BAL cytology > no malignant cells 1/27 BAL for wet mount> neg for parasites 1/27 MRI brain > small acute infarcts in cerebellum and cerebral cortex, sucal signal 1/29 LP > Glucose 82, protein >600, WBC 600>885, eosinophils 28% 1/29 Echo > LV EF 60-65%, indeterminent diastolic function, wall motion normal 1/30 EEG >> diffuse slowing and background suppression, no seizures 2/3 MRI thoracic/lumbar/cervical > (-) abscess. Meningitis. R/O CMV 2/8 abd/pevlis CT > borderline to mildly enlarged loops of small bowel throughout the abd with dilated fluid filled R colon and transverses colon, findings are suggestive of an ileus. Partial bowel obstruction included in the differential, but felt less likely. Partial consolidation in the LLL concerning for  PNA.   SUBJECTIVE:  Cap since 2/21 day and night, patient pulls trach out 4 times per day.  VITAL SIGNS: Temp:  [98.3 F (36.8 C)-99.2 F (37.3 C)] 99.2 F (37.3 C) (02/26 1302) Pulse Rate:  [81-91] 86 (02/26 1302) Resp:  [18-28] 18 (02/26 1302) BP: (121-126)/(71-75) 126/71 (02/26 1302) SpO2:  [92 %-100 %] 100 % (02/26 1302) Weight:  [83 kg (183 lb)] 83 kg (183 lb) (02/26 0314)  PHYSICAL EXAMINATION: General: Adult male, no distress, trach capped and patient is somewhat talking. Neuro: Alert, oriented x 3, follows commands, moves extremities  HEENT: capped trach in place > clean, dry, weak cough Cardiovascular: RRR, no MRG, NI S1/S2 Lungs: Clear breath sounds, non-labored  Abdomen: non-tender, non-distended, active bowel sounds  Musculoskeletal: no acute   Skin: warm, dry, intact   Recent Labs Lab 12/30/16 0046 12/31/16 0546 01/02/17 0600  NA 134* 136 136  K 4.8 4.2 4.3  CL 102 103 97*  CO2 _0 BUN 34* 27* 20  CREATININE 1.25* 1.18 1.31*  GLUCOSE 319* 94 72   Recent Labs Lab 12/31/16 0546 01/02/17 0600  HGB 12.4* 11.5*  HCT 38.7* 35.7*  WBC 12.4* 7.9  PLT 239 203   I reviewed CXR myself, trach in good position  ASSESSMENT / PLAN:  50 year old male with prolonged critical illness requiring trach.  Discussed with TRH-MD.    Tracheotomy status Plan  - Decannulate today  - DNR status, no intubation/trach placement any further at this point.  Hypoxemia:  - Maintain Saturation >92 (patient is currently on  room air)  - Once decannulated will need desat study for ?of home 2.  Dysphagia:  - Work with speech pathology  - Will need to discuss longterm plan.  PCCM will sign off, please call back if needed.  Rush Farmer, M.D. [facility] Pulmonary/Critical Care Medicine. Pager: [phone]. After hours pager: [phone].  01/03/2017 3:06 PM

## 2017-01-03 NOTE — Progress Notes (Signed)
Central monitor call RN about patient having 5 beats of V-tach. Patient was receiving a bed bath and he was asymptomatic. MD notified. Will continue to monitor.

## 2017-01-03 NOTE — Progress Notes (Signed)
PROGRESS NOTE    Lawrence Kane  JAS:[id number] DOB: 1966-08-17 DOA: 12/01/2016 PCP: No primary care provider on file.   Brief Narrative: 51 y.o.malewith history of COPD, HIV, substance abuse, noncompliance, recent surgery in [place]; who was admitted via [facility] on 12/01/16 with AMS changes and septic shock due to bacterial meningitis. He was intubated for airway protection and started on broad spectrum antibiotics as well as acyclovir for scrotal lesions. LP with high protein, low glucose and high WBC with predominance of eosinophils. ID consulted and questioned parasitic infection. Repeat LP 1/29 showed elevated glucose, elevated WBC with 28% eosinophils. BAL cytology/wet mount negative for malignant cells or parasites. MRI brain done 1/27 due to ongoing decrease in level of responsiveness, decrease in movement of BUE and flaccid BLE. MRI brain reviewed, showing small infarcts in cerebellum and cortex. ?due to vasospasms or central/embolic disease and generalized sinuitis/mastoidits. Neurology questioned HSV vasculopathy and recommended MRA brain for work up. MRA brain revealed irregular R-ICA no large vessel stenosis and beaded appearance of right cervical ICA compatible with fibromuscular dysplasia. Infarcts felt to be due to vasculitis/vasospams secondary to meningoencephalitis. MRI total spine was negative for abscess or hematoma, generalized meningitis and radiculitis and multifocal dorsal thoracic cord signal abnormality from myelitis. 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 He has had loose stools due to partial SBO v/s ileus. Agitation improving. He has completed treatment for ESBL K-pneumoniae in BAL. He was weaned to tracheostomy 2/5. Developed ileus which is now improved. Currently on dysphagia diet.   Assessment & Plan:  # Acute hypoxic respiratory failure, tracheostomy on  February 5:  - Patient was treated for ESBL Klebsiella HCAP, sepsis. Completed antibiotics on 12/30/16. Patient was on the ventilator for prolonged time underwent tracheostomy placement. Patient has been removing trach often. Patient is currently on room air and not using tracheostomy tube. I discussed with the patient's mother who is wondering if that tracheostomy tube can be removed safely. I called pulmonologist and discussed with Dr.Yacoub. It seems like pulmonary critical care team is also following the patient. -Respiratory therapy consult appreciated.  #Scrotal wound, history of genital warts: Treated with IV fluconazole and evaluated by urologist. Continue local wound care.  #HSV meningitis with transverse myelitis: As per medical record, HSV positive and CSF from [facility]. Evaluated by infectious disease. The patient completed 21 days course of acyclovir. Currently on Bactrim for PCP prophylaxis. -Also treated with high-dose steroid for 5 days and then tapering dose of steroid.  -As per previous neurology consult note from 12/19/2016 recommended follow-up MRI of the spine in 2-3 weeks. Today I discussed with neurologist on call Dr. Leonel Ramsay recommended that the repeating MRI now will not probably change the management. I discussed this with patient's mother who agreed with the plan. -Continue PTs and OT valuation.  #Ileus improved: On dysphagia diet.  #Diabetes type 2: Continue current insulin regimen. Monitor blood sugar level.  #Acute metabolic encephalopathy: In the setting of meningitis. Patient with intermittent agitation. Mentally status improved significantly.  #HIV infection, hepatitis C virus antibody positive, chronic hepatitis B infection: Currently on antiviral medication. Outpatient follow-up with infectious disease  #Oropharyngeal dysphagia and goals of care discussion: Palliative care consult appreciated. There are meeting with the family on 2/25 and patient is  now DO NOT RESUSCITATE. I discussed with the patient's mother who is requesting if we can transfer/discharge patient  to hosp[facility] care service at [facility]. Social worker consulted to arrange for discharge planning. -Patient was very angry and asking to advance her diet. I discussed with the speech and swallow team who reported that patient continues to have dysphagia and unable to advance diet without risk. I discussed this with the patient's mother regarding patient's DO NOT RESUSCITATE status and his request to advance her diet. I also explained that advancing diet now may increase the risk of pneumonia and choking. [name] would like to continue current dysphagia diet and stated not to advance further which may cause more problems. She said she wants to take patient back to [place] and then take from there. -Patient was complaining of generalized body pain and not improving with current dose of oxycodone. I added as needed Dilaudid. Continue to monitor. -I also increased the dose of gabapentin to 300 mg.  Principal Problem:   Adjustment disorder with mixed anxiety and depressed mood Active Problems:   COPD (chronic obstructive pulmonary disease) (HCC)   Substance abuse   Genital warts   HIV (human immunodeficiency virus infection) ([place])   AIDS (acquired immune deficiency syndrome) ([place])   Noncompliance   Bacterial meningitis   Hypertensive urgency   Septic shock (HCC)   Transaminitis   Cocaine abuse   Open wound   Acute respiratory failure (HCC)   Altered mental status   Acute encephalopathy   Cerebral embolism with cerebral infarction   Endotracheally intubated   Meningoencephalitis   Neurosyphilis   Herpesviral meningitis   Paraplegia ([place])   Acute pulmonary edema (HCC)   Transverse myelitis (HCC)   Encounter for orogastric (OG) tube placement   Somnolence   Abdominal distention   Ileus (HCC)   Muscle weakness (generalized)   Acute respiratory failure with  hypoxia (HCC)   Tracheostomy status (HCC)   Hypoxemia   Respiratory distress   Agitation   Hypernatremia   Leukocytosis   Acute blood loss anemia   Pain   Palliative care encounter   Goals of care, counseling/discussion  DVT prophylaxis: Heparin subcutaneous Code Status: DO NOT RESUSCITATE Family Communication: No family present at bedside Disposition Plan: Likely discharge to SNF in 1-2 days.  STUDIES: 1/24 CXR >> ATX vs LLL infiltrate 1/25 CT head >> neg for acute intracranial process 1/25 LP cytology >> no malignant cells 1/25 LP >> yellow, cloudy, glucose 20, total protein >600, RBC 114>49, WBC 970, 36% neutrophil, 20% lymphs, 19% eos 1/28 BAL cell count >> 266 WBC, eos 1 1/28 BAL cytology >> no malignant cells 1/27 BAL for wet mount> neg for parasites 1/27 MRI brain >> small acute infarcts in cerebellum and cerebral cortex, sucal signal 1/29 LP >> Glucose 82, protein >600, WBC 600>885, eosinophils 28% 1/29 Echo >> LV EF 60-65%, indeterminent diastolic function, wall motion normal 1/30 EEG >> diffuse slowing and background suppression, no seizures 2/3 MRI thoracic/lumbar/cervical >> (-) abscess. Meningitis. R/O CMV   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 1/24 Cryptococcal [name]): negative 1/24 CSF [facility]): NGTD 1/22 scrotal wound cx [name]): MRSA Toxoplasma [name]): Negative 1/25BCx x2 : NGTD 1/25 HCV ab + 1/24 Trach asp 1/24: NGTD 1/24MRSA PCR: MRSA pos 1/25 HIV quant: 80, CD4 10 1/25 CSF AFB: Smear neg, >> 1/25 CSF fungal cx: NGTD 1/25 CSF crypto ag: neg 1/25 CSF gram stain no organisms, cx: NGTD 1/27 strongyloides ab >> negative 1/27 coccidiomycosis ab >> 1/28 BAL AFB smear/cx> 1/28 BAL Fungus cx >> 1/28 BAL  gram stain/Cx: 40K ESBL Klebsiella pneumoniae 1/28 BAL pneumocystis smear: Neg 1/27 O&P >> 1/29 CSF Cx >> no organisms on gram stain, >> NGTD 2/04 CSF CMV >>not  done?   ANTIBIOTICS: Rocephin 1/24>>1/30 Vancomycin 1/24>>1/30; 2/2 > Acyclovir 1/24>> 1/26, 1/27>> Ampicillin 1/24>> 1/27 Bactrim M/W/F for PCP prophylaxis Meropenem 1/30>>2/1; 2/2 > Pen G 2/1 >> 2/2 Cipro 2/1 >> 2/2 Meropenem/vancomycin-2/15-  SIGNIFICANT EVENTS: 1/24 Transferred from [facility] 1/25 Underwent LP here 2/05 Trach 2/06 To ATC 2/8  patient developed ileus  LINES/TUBES: L IJ CVL 1/24 >> ETT 1/24 >> 2/5 Foley 1/24 >> OGT 1/24 >> 2/5 Trach (df) 2/5 >>  Consultants:  Infectious disease. Critical care medicine. Neurology. Psychiatry  Subjective: Patient was seen and examined at bedside. Patient was alert awake and asking to advance the diet. He was angry that we were not advancing diet. Education provided to the patient. He reported generalized body pain and not improving with current dose of oxycodone. He was asking for IV Dilaudid for the pain management as needed.  Objective: Vitals:   01/03/17 0314 01/03/17 0343 01/03/17 0625 01/03/17 0748  BP:   125/71   Pulse:  85 81 84  Resp:  _0 Temp:   99.2 F (37.3 C)   TempSrc:   Oral   SpO2:  93% 95% 92%  Weight: 83 kg (183 lb)     Height:        Intake/Output Summary (Last 24 hours) at 01/03/17 1115 Last data filed at 01/03/17 0816  Gross per 24 hour  Intake          1255.42 ml  Output             1875 ml  Net          -619.58 ml   Filed Weights   01/01/17 0527 01/02/17 0500 01/03/17 0314  Weight: 87.1 kg (192 lb) 82.3 kg (181 lb 8 oz) 83 kg (183 lb)    Examination:  General exam: Chronically ill looking male lying in bed comfortable Neck: Tracheostomy tube capped Respiratory system: Clear to auscultation. Respiratory effort normal.  Cardiovascular system: S1 & S2 heard, RRR.  No pedal edema. Gastrointestinal system: Abdomen is nondistended, soft and nontender. Normal bowel sounds heard. Central nervous system: Alert awake and following commands Psychiatry: Judgement and  insight appear impaired.     Data Reviewed: I have personally reviewed following labs and imaging studies  CBC:  Recent Labs Lab 12/31/16 0546 01/02/17 0600  WBC 12.4* 7.9  HGB 12.4* 11.5*  HCT 38.7* 35.7*  MCV 95.3 94.4  PLT 239 998   Basic Metabolic Panel:  Recent Labs Lab 12/30/16 0046 12/31/16 0546 01/02/17 0600  NA 134* 136 136  K 4.8 4.2 4.3  CL 102 103 97*  CO2 _1 GLUCOSE 319* 94 72  BUN 34* 27* 20  CREATININE 1.25* 1.18 1.31*  CALCIUM 8.8* 8.8* 8.5*   GFR: Estimated Creatinine Clearance: 78.3 mL/min (by C-G formula based on SCr of 1.31 mg/dL (H)). Liver Function Tests:  Recent Labs Lab 12/31/16 0546 01/02/17 0600  AST 33 47*  ALT 62 62  ALKPHOS 87 81  BILITOT 0.6 0.6  PROT 6.8 6.6  ALBUMIN 2.1* 2.0*   No results for input(s): LIPASE, AMYLASE in the last 168 hours. No results for input(s): AMMONIA in the last 168 hours. Coagulation Profile: No results for input(s): INR, PROTIME in the last 168 hours. Cardiac Enzymes: No results for input(s): CKTOTAL, CKMB, CKMBINDEX,  TROPONINI in the last 168 hours. BNP (last 3 results) No results for input(s): PROBNP in the last 8760 hours. HbA1C: No results for input(s): HGBA1C in the last 72 hours. CBG:  Recent Labs Lab 01/02/17 2003 01/02/17 2204 01/03/17 0008 01/03/17 0354 01/03/17 0748  GLUCAP 96 107* 121* 163* 82   Lipid Profile: No results for input(s): CHOL, HDL, LDLCALC, TRIG, CHOLHDL, LDLDIRECT in the last 72 hours. Thyroid Function Tests: No results for input(s): TSH, T4TOTAL, FREET4, T3FREE, THYROIDAB in the last 72 hours. Anemia Panel: No results for input(s): VITAMINB12, FOLATE, FERRITIN, TIBC, IRON, RETICCTPCT in the last 72 hours. Sepsis Labs: No results for input(s): PROCALCITON, LATICACIDVEN in the last 168 hours.  No results found for this or any previous visit (from the past 240 hour(s)).       Radiology Studies: No results found.      Scheduled Meds: .  aspirin  325 mg Oral Daily  . chlorhexidine gluconate (MEDLINE KIT)  15 mL Mouth Rinse BID  . clonazePAM  0.5 mg Oral TID  . darunavir-cobicistat  1 tablet Oral Q breakfast  . dolutegravir  50 mg Oral Daily  . emtricitabine-tenofovir AF  1 tablet Oral Daily  . gabapentin  300 mg Oral TID  . heparin  5,000 Units Subcutaneous Q8H  . insulin aspart  0-15 Units Subcutaneous Q4H  . insulin glargine  18 Units Subcutaneous BID  . lactulose  10 g Oral BID  . mouth rinse  15 mL Mouth Rinse QID  . OLANZapine  5 mg Oral QHS  . pantoprazole sodium  40 mg Oral Daily  . scopolamine  1 patch Transdermal Q72H  . sulfamethoxazole-trimethoprim  20 mL Oral Daily   Continuous Infusions: . sodium chloride 1,000 mL (01/03/17 1059)  . feeding supplement (JEVITY 1.2 CAL) Stopped (12/24/16 1832)     LOS: 33 days    Madyn Ivins Tanna Furry, MD [facility] Pager [phone]  If 7PM-7AM, please contact night-coverage [email or web address] Password [email or web address] 01/03/2017, 11:15 AM

## 2017-01-03 NOTE — Progress Notes (Signed)
Patient was decannulated. Stoma is cover with gauze secured with tape. Patient doesn't show any symptoms of distress. Will continue to monitor.

## 2017-01-04 ENCOUNTER — Inpatient Hospital Stay
Admission: RE | Admit: 2017-01-04 | Discharge: 2017-01-31 | Disposition: A | Discharge status: Nursing Home (Includes ICF) | Payer: Medicaid Other | Source: Ambulatory Visit | Attending: Internal Medicine | Admitting: Internal Medicine

## 2017-01-04 DIAGNOSIS — J9601 Acute respiratory failure with hypoxia: Secondary | ICD-10-CM

## 2017-01-04 DIAGNOSIS — B20 Human immunodeficiency virus [HIV] disease: Secondary | ICD-10-CM

## 2017-01-04 DIAGNOSIS — Z8701 Personal history of pneumonia (recurrent): Principal | ICD-10-CM

## 2017-01-04 DIAGNOSIS — M6281 Muscle weakness (generalized): Secondary | ICD-10-CM

## 2017-01-04 DIAGNOSIS — R1314 Dysphagia, pharyngoesophageal phase: Secondary | ICD-10-CM

## 2017-01-04 LAB — GLUCOSE, CAPILLARY
GLUCOSE-CAPILLARY: 108 mg/dL — AB (ref 65–99)
Glucose-Capillary: 126 mg/dL — ABNORMAL HIGH (ref 65–99)
Glucose-Capillary: 138 mg/dL — ABNORMAL HIGH (ref 65–99)
Glucose-Capillary: 84 mg/dL (ref 65–99)

## 2017-01-04 MED ORDER — OXYCODONE HCL 5 MG PO TABS: 5.0000 mg | ORAL_TABLET | ORAL | 0 refills | Status: DC | PRN

## 2017-01-04 MED ORDER — FENTANYL 25 MCG/HR TD PT72: 25.0000 ug | MEDICATED_PATCH | TRANSDERMAL | 0 refills | Status: DC

## 2017-01-04 MED ORDER — CLONAZEPAM 1 MG PO TABS: 1.0000 mg | ORAL_TABLET | Freq: Three times a day (TID) | ORAL | 0 refills | Status: AC | PRN

## 2017-01-04 NOTE — Clinical Social Work Placement (Signed)
   CLINICAL SOCIAL WORK PLACEMENT  NOTE  Date:  01/04/2017  Patient Details  Name: Lawrence Kane MRN: [id number] Date of Birth: [birth year]  Clinical Social Work is seeking post-discharge placement for this patient at the Skilled  Nursing Facility level of care (*CSW will initial, date and re-position this form in  chart as items are completed):  Yes   Patient/family provided with [facility] Clinical Social Work Department's list of facilities offering this level of care within the geographic area requested by the patient (or if unable, by the patient's family).  Yes   Patient/family informed of their freedom to choose among providers that offer the needed level of care, that participate in Medicare, Medicaid or managed care program needed by the patient, have an available bed and are willing to accept the patient.  Yes   Patient/family informed of [facility]'s ownership interest in [facility] and [facility], as well as of the fact that they are under no obligation to receive care at these facilities.  PASRR submitted to EDS on 12/15/16     PASRR number received on 12/15/16     Existing PASRR number confirmed on       FL2 transmitted to all facilities in geographic area requested by pt/family on 12/27/16     FL2 transmitted to all facilities within larger geographic area on       Patient informed that his/her managed care company has contracts with or will negotiate with certain facilities, including the following:        Yes   Patient/family informed of bed offers received.  Patient [facility] bed [facility]     Physician recommends and patient chooses bed at      Patient to be transferred to [facility] on 01/04/17.  Patient to be transferred to facility by ptar     Patient family notified on 01/04/17 of transfer.  Name of family member notified:  Magda PaganiniAudrey     PHYSICIAN Please sign FL2, Please sign DNR     Additional Comment:     _______________________________________________ Burna SisUris, Pleasant Britz H, LCSW 01/04/2017, 1:25 PM

## 2017-01-04 NOTE — Progress Notes (Signed)
Pt remains on DTP list for local facility.  CSW consulted to look into SNF and hospice options near [place]  Patient is not appropriate for hospice placement at this time- would need to have less than a month prognosis to be appropriate for looking into these options.  CSW attempting to look into options near [place] but barriers include that patient has [place] Medicaid which [place] facility- Patient also continues to be limited by expensive HIV antiretroviral medications.  CSW will continue to follow  Burna SisJenna H. Skylynn Burkley, LCSW Clinical Social Worker [phone]

## 2017-01-04 NOTE — Progress Notes (Signed)
Pt refused Lactulose, Insulin Lantus and insulin Novolog, stating he is not diabetic, MD notified.

## 2017-01-04 NOTE — Progress Notes (Signed)
Patient will discharge to [facility]  Anticipated discharge date: 2/27 Family notified: Magda Paganiniudrey (mom) Transportation by [facility]- scheduled at 2pm Report #: [phone]  CSW signing off.  Burna SisJenna H. Yeni Jiggetts, LCSW Clinical Social Worker [phone]

## 2017-01-04 NOTE — Progress Notes (Addendum)
Patient refused Insulin for AM and lunch (Lantus and Novolog) . He said he doesn't have Diabetes and he will not take Insulin. Education provided. MD aware. Patient refused to be transferred from chair to bed. He was all night in the chair but he said he is good where he is.  Foley was removed by MD order, patient being D/C to SNF. Will continue to monitor.

## 2017-01-04 NOTE — Progress Notes (Signed)
Pt refused continuous pulse oxymetry, the is constantly taking probe off. Education provided and possible consequences explained.

## 2017-01-04 NOTE — NC FL2 (Signed)
[place] LEVEL OF CARE SCREENING TOOL     IDENTIFICATION  Patient Name: Lawrence Kane Birthdate: March 07, 1966 Sex: male Admission Date (Current Location): 12/01/2016  [place] and [place] Number:  [name] [id number] [place] and Address:  [facility], [place] [address] [place], [place] [address]      Provider Number: [id number]  Attending Physician Name and Address:  Rosita Fire, MD  Relative Name and Phone Number:  Neoma Laming [phone]    Current Level of Care: Hospital Recommended Level of Care: [place] Prior Approval Number:    Date Approved/Denied:   PASRR Number: [id number]  Discharge Plan: SNF    Current Diagnoses: Patient Active Problem List   Diagnosis Date Noted  . Dysphagia   . Adjustment disorder with mixed anxiety and depressed mood 12/30/2016  . Goals of care, counseling/discussion   . Palliative care encounter   . Respiratory distress   . Agitation   . Hypernatremia   . Leukocytosis   . Acute blood loss anemia   . Pain   . Acute respiratory failure with hypoxia ([place])   . Tracheostomy status ([place])   . Hypoxemia   . Muscle weakness (generalized)   . Ileus ([place])   . Abdominal distention   . Transverse myelitis ([place])   . Encounter for orogastric (OG) tube placement   . Somnolence   . Paraplegia ([place])   . Acute pulmonary edema (H[facility])   . Neurosyphilis   . Herpesviral meningitis   . Cerebral embolism with cerebral infarction 12/08/2016  . Endotracheally intubated   . Meningoencephalitis   . Acute encephalopathy   . Acute respiratory failure ([place]) 12/02/2016  . Altered mental status   . COPD (chronic obstructive pulmonary disease) ([place]) 12/01/2016  . Substance abuse 12/01/2016  . Genital warts 12/01/2016  . HIV (human immunodeficiency virus infection) ([place]) 12/01/2016  . AIDS (acquired immune deficiency syndrome) ([place]) 12/01/2016  . Noncompliance 12/01/2016  . Bacterial  meningitis 12/01/2016  . Hypertensive urgency 12/01/2016  . Septic shock ([place]) 12/01/2016  . Transaminitis 12/01/2016  . Cocaine abuse 12/01/2016  . Open wound 12/01/2016    Orientation RESPIRATION BLADDER Height & Weight     Self, Time, Situation, Place  Normal Incontinent Weight: 183 lb (83 kg) Height:  _0  (193 cm)  BEHAVIORAL SYMPTOMS/MOOD NEUROLOGICAL BOWEL NUTRITION STATUS   (NONE)   Incontinent Diet (see DC summary)  AMBULATORY STATUS COMMUNICATION OF NEEDS Skin   Extensive Assist Verbally Skin abrasions (abrasion on scrotum/ warts- requring gauze dressing changes PRN)                       Personal Care Assistance Level of Assistance  Bathing, Dressing, Feeding Bathing Assistance: Maximum assistance Feeding assistance: Limited assistance Dressing Assistance: Maximum assistance Total Care Assistance:  (NA)   Functional Limitations Info  Speech Sight Info: Adequate Hearing Info: Adequate Speech Info: Impaired (uses PMV but very soft speech at this time and still having to mouth most words)    [place]  PT (By licensed PT), OT (By licensed OT)     PT Frequency: 5/wk OT Frequency: 5/wk            Contractures Contractures Info: Not present    Additional Factors Info  Code Status, Allergies, Isolation Precautions, Insulin Sliding Scale, Psychotropic Code Status Info: DNR Allergies Info: Iodine, Ketorolac, Tylenol Acetaminophen Psychotropic Info: klonopin Insulin Sliding Scale Info: 8/day Isolation Precautions Info: ESBL, MRSA Suctioning  Needs: NA   Current Medications (01/04/2017):  This is the current hospital active medication list Current Facility-Administered Medications  Medication Dose Route Frequency Provider Last Rate Last Dose  . 0.9 %  sodium chloride infusion   Intravenous Continuous Dron Tanna Furry, MD 100 mL/hr at 01/04/17 [phone]    . acetaminophen (TYLENOL) tablet 650 mg  650 mg Oral Q4H PRN Velna Ochs, MD    650 mg at 01/03/17 0215  . aspirin tablet 325 mg  325 mg Oral Daily Reyne Dumas, MD   325 mg at 01/03/17 1027  . chlorhexidine gluconate (MEDLINE KIT) (PERIDEX) 0.12 % solution 15 mL  15 mL Mouth Rinse BID Laverle Hobby, MD   15 mL at 01/03/17 0815  . chlorproMAZINE (THORAZINE) 25 mg in sodium chloride 0.9 % 25 mL IVPB  25 mg Intravenous Q8H PRN Florinda Marker, MD   25 mg at 12/07/16 2121  . clonazePAM (KLONOPIN) tablet 0.5 mg  0.5 mg Oral TID Reyne Dumas, MD   0.5 mg at 01/03/17 2110  . darunavir-cobicistat (PREZCOBIX) 800-150 MG per tablet 1 tablet  1 tablet Oral Q breakfast Brand Males, MD   1 tablet at 01/03/17 0814  . docusate (COLACE) 50 MG/5ML liquid 100 mg  100 mg Oral BID PRN Reyne Dumas, MD      . dolutegravir (TIVICAY) tablet 50 mg  50 mg Oral Daily Velna Ochs, MD   50 mg at 01/03/17 1027  . emtricitabine-tenofovir AF (DESCOVY) 200-25 MG per tablet 1 tablet  1 tablet Oral Daily Reyne Dumas, MD   1 tablet at 01/03/17 1026  . feeding supplement (JEVITY 1.2 CAL) liquid 1,000 mL  1,000 mL Per Tube Continuous Rush Farmer, MD   Stopped at 12/24/16 1832  . gabapentin (NEURONTIN) capsule 300 mg  300 mg Oral TID Rosita Fire, MD   300 mg at 01/03/17 2110  . guaiFENesin-dextromethorphan (ROBITUSSIN DM) 100-10 MG/5ML syrup 5 mL  5 mL Oral Q4H PRN Reyne Dumas, MD      . haloperidol lactate (HALDOL) injection 5 mg  5 mg Intravenous Q6H PRN Raylene Miyamoto, MD   5 mg at 12/22/16 1430  . heparin injection 5,000 Units  5,000 Units Subcutaneous Q8H Velna Ochs, MD   5,000 Units at 01/03/17 1415  . hydrALAZINE (APRESOLINE) injection 10 mg  10 mg Intravenous Q6H PRN Praveen Mannam, MD   10 mg at 12/24/16 0424  . HYDROmorphone (DILAUDID) injection 0.5 mg  0.5 mg Intravenous Q4H PRN Dron Tanna Furry, MD   0.5 mg at 01/03/17 2135  . insulin aspart (novoLOG) injection 0-15 Units  0-15 Units Subcutaneous Q4H Bonnielee Haff, MD   1 Units at 01/03/17 1222  . insulin  glargine (LANTUS) injection 18 Units  18 Units Subcutaneous BID Reyne Dumas, MD   18 Units at 01/03/17 1028  . lactulose (CHRONULAC) 10 GM/15ML solution 10 g  10 g Oral BID Reyne Dumas, MD   10 g at 01/03/17 1027  . MEDLINE mouth rinse  15 mL Mouth Rinse QID Laverle Hobby, MD   15 mL at 01/04/17 0400  . OLANZapine (ZYPREXA) tablet 5 mg  5 mg Oral QHS Jannette Fogo, NP   5 mg at 01/03/17 2109  . oxyCODONE (Oxy IR/ROXICODONE) immediate release tablet 5 mg  5 mg Oral Q6H PRN Jannette Fogo, NP   5 mg at 01/04/17 0051  . pantoprazole sodium (PROTONIX) 40 mg/20 mL oral suspension 40 mg  40 mg Oral Daily Nayana Abrol,  MD   40 mg at 01/03/17 1025  . phenol (CHLORASEPTIC) mouth spray 1 spray  1 spray Mouth/Throat PRN Jannette Fogo, NP      . RESOURCE THICKENUP CLEAR   Oral PRN Reyne Dumas, MD      . scopolamine (TRANSDERM-SCOP) 1 MG/3DAYS 1.5 mg  1 patch Transdermal Q72H Reyne Dumas, MD   1.5 mg at 01/01/17 1330  . sennosides (SENOKOT) 8.8 MG/5ML syrup 5 mL  5 mL Oral BID PRN Reyne Dumas, MD      . sulfamethoxazole-trimethoprim (BACTRIM,SEPTRA) 200-40 MG/5ML suspension 20 mL  20 mL Oral Daily Reyne Dumas, MD   20 mL at 01/03/17 1059   Facility-Administered Medications Ordered in Other Encounters  Medication Dose Route Frequency Provider Last Rate Last Dose  . chlorhexidine gluconate (MEDLINE KIT) (PERIDEX) 0.12 % solution 15 mL  15 mL Mouth Rinse BID [place], MD      . MEDLINE mouth rinse  15 mL Mouth Rinse 10 times per day Rush Landmark, MD         Discharge Medications: Please see discharge summary for a list of discharge medications.  Relevant Imaging Results:  Relevant Lab Results:   Additional Information SS [id number], pt on HIV antiretroviral medications (Tivicay, Prezcobis, Descovy)  Oluwadamilola Rosamond, Connye Burkitt, LCSW

## 2017-01-04 NOTE — Progress Notes (Addendum)
Patient was discharged to nursing home [facility]) by MD order; discharged instructions  review and sent to facility by social worker with care notes and prescriptions; IV DIC;  patient will be transported to facility via [facility]. Facility was called and report was given to nurse Kathie RhodesBetty.

## 2017-01-04 NOTE — Progress Notes (Addendum)
Before patient left he was bladder scanned and there were 120cc urine. MD was informed and also SNF nurse was informed.

## 2017-01-04 NOTE — Discharge Summary (Addendum)
Physician Discharge Summary  BEXTON HAAK JFH:[id number] DOB: 17-Mar-1966 DOA: 12/01/2016  PCP: No primary care provider on file.  Admit date: 12/01/2016 Discharge date: 01/04/2017  Admitted From:[facility] Disposition: SNF  Recommendations for Outpatient Follow-up:  1. Follow up with PCP in 1-2 weeks 2. Please obtain BMP/CBC in one week 3. Follow up with ID 4. Please do bladder scan at SNF to make sure patient is not having urinary retention. The catheter has been discontinued on the day of discharge. If retention, may need to re-insert the catheter and follow up with urologist outpatient.  Home Health:SNF Equipment/Devices:No Discharge Condition:stable CODE STATUS:DNR Diet recommendation: dysphagia level 1 diet  Brief/Interim Summary:51 y.o.malewith history of COPD, HIV, substance abuse, noncompliance, recent surgery in [place]; who was admitted via [facility] on 12/01/16 with AMS changes and septic shock due to bacterial meningitis. He was intubated for airway protection and started on broad spectrum antibiotics as well as acyclovir for scrotal lesions. LP with high protein, low glucose and high WBC with predominance of eosinophils. ID consulted and questioned parasitic infection. Repeat LP 1/29 showed elevated glucose, elevated WBC with 28% eosinophils. BAL cytology/wet mount negative for malignant cells or parasites. MRI brain done 1/27 due to ongoing decrease in level of responsiveness, decrease in movement of BUE and flaccid BLE. MRI brain reviewed, showing small infarcts in cerebellum and cortex. ?due to vasospasms or central/embolic disease and generalized sinuitis/mastoidits. Neurology questioned HSV vasculopathy and recommended MRA brain for work up. MRA brain revealed irregular R-ICA no large vessel stenosis and beaded appearance of right cervical ICA compatible with fibromuscular dysplasia. Infarcts felt to be due to vasculitis/vasospams secondary to meningoencephalitis.  MRI total spine was negative for abscess or hematoma, generalized meningitis and radiculitis and multifocal dorsal thoracic cord signal abnormality from myelitis. 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 He has had loose stools due to partial SBO v/s ileus. Agitation improving. He has completed treatment for ESBL K-pneumoniae in BAL. He was weaned to tracheostomy 2/5. Developed ileus which is now improved. Currently on dysphagia diet.  # Acute hypoxic respiratory failure, tracheostomy placed which is decannulated by pulmonologist  - Patient was treated for ESBL Klebsiella HCAP, sepsis. Completed antibiotics on 12/30/16. Patient was on the ventilator for prolonged time underwent tracheostomy placement. Patient has been removing trach often. The patient was evaluated by pulmonologist and the tracheostomy was decannulated. Patient is breathing well and currently on room air.  #Scrotal wound, history of genital warts: Treated with IV fluconazole and evaluated by urologist. Continue local wound care.  #HSV meningitis with transverse myelitis: As per medical record, HSV positive and CSF from [facility]. Evaluated by infectious disease. The patient completed 21 days course of acyclovir. Currently on Bactrim for PCP prophylaxis. -Also treated with high-dose steroid for 5 days and then tapering dose of steroid.  -As per previous neurology consult note from 12/19/2016 recommended follow-up MRI of the spine in 2-3 weeks. On 01/03/2017,  I discussed with neurologist on call Dr. Leonel Ramsay, he recommended that the repeating MRI now will not probably change the management. I discussed this with patient's mother who agreed with the plan. -Evaluated by PT, OT and social worker. Patient is now being transferred his care to SNF. His mental status is slowly improving.  #Ileus improved: On dysphagia  diet.  #Diabetes type 2: Continue current insulin regimen. Monitor blood sugar level.  #Acute  metabolic encephalopathy: In the setting of meningitis. Patient with intermittent agitation. Mentally status improved significantly.  #HIV infection, hepatitis C virus antibody positive, chronic hepatitis B infection: Currently on antiviral medication. Outpatient follow-up with infectious disease  #Oropharyngeal dysphagia and goals of care discussion: Palliative care consult appreciated. There was family meeting on 2/25 and patient is now DO NOT RESUSCITATE. I discussed with the patient's mother during hospitalization. Recommended dysphagia 1 diet. The patient wanted to eat regular reported however there is risk of aspiration and choking. I discussed this risk with the patient's mother who agreed with the dysphagia diet and probably outpatient follow-up with speech and swallow.  Patient's mental status seems improving. He is in room air breathing well. Tolerating dysphagia diet well. He is a DO NOT RESUSCITATE. He has prolonged hospitalization. I discussed with the social worker regarding discharge plan. Patient may be able to discharge to SNF today if bed is available. At this time, I think patient benefited from inpatient hospitalization and stable enough to transfer his care to outpatient.  The foley catheter is discontinued. Need to monitor urine output at SNF.  STUDIES: 1/24 CXR >> ATX vs LLL infiltrate 1/25 CT head >> neg for acute intracranial process 1/25 LP cytology >> no malignant cells 1/25 LP >> yellow, cloudy, glucose 20, total protein >600, RBC 114>49, WBC 970, 36% neutrophil, 20% lymphs, 19% eos 1/28 BAL cell count >> 266 WBC, eos 1 1/28 BAL cytology >> no malignant cells 1/27 BAL for wet mount> neg for parasites 1/27 MRI brain >> small acute infarcts in cerebellum and cerebral cortex, sucal signal 1/29 LP >> Glucose 82, protein >600, WBC 600>885, eosinophils 28% 1/29 Echo >> LV  EF 60-65%, indeterminent diastolic function, wall motion normal 1/30 EEG >> diffuse slowing and background suppression, no seizures 2/3 MRI thoracic/lumbar/cervical >> (-) abscess. Meningitis. R/O CMV  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 1/24 Cryptococcal [name]): negative 1/24 CSF [facility]): NGTD 1/22 scrotal wound cx [name]): MRSA Toxoplasma [name]): Negative 1/25BCx x2 : NGTD 1/25 HCV ab + 1/24 Trach asp 1/24: NGTD 1/24MRSA PCR: MRSA pos 1/25 HIV quant: 80, CD4 10 1/25 CSF AFB: Smear neg, >> 1/25 CSF fungal cx: NGTD 1/25 CSF crypto ag: neg 1/25 CSF gram stain no organisms, cx: NGTD 1/27 strongyloides ab >> negative 1/27 coccidiomycosis ab >> 1/28 BAL AFB smear/cx> 1/28 BAL Fungus cx >> 1/28 BAL gram stain/Cx: 40K ESBL Klebsiella pneumoniae 1/28 BAL pneumocystis smear: Neg 1/27 O&P >> 1/29 CSF Cx >> no organisms on gram stain, >> NGTD  ANTIBIOTICS: Rocephin 1/24>>1/30 Vancomycin 1/24>>1/30; 2/2 > Acyclovir 1/24>> 1/26, 1/27>> Ampicillin 1/24>> 1/27 Bactrim M/W/F for PCP prophylaxis Meropenem 1/30>>2/1; 2/2 > Pen G 2/1 >> 2/2 Cipro 2/1 >> 2/2 Meropenem/vancomycin-2/15-  SIGNIFICANT EVENTS: 1/24 Transferred from [facility] 1/25 Underwent LP here 2/05 Trach 2/06 To ATC 2/8 patient developed ileus  LINES/TUBES: L IJ CVL 1/24 >> ETT 1/24 >> 2/5 Foley 1/24 >> OGT 1/24 >> 2/5 Trach (df) 2/5 >>  Discharge Diagnoses:  Principal Problem:   Adjustment disorder with mixed anxiety and depressed mood Active Problems:   COPD (chronic obstructive pulmonary disease) (HCC)   Substance abuse   Genital warts   HIV (human immunodeficiency virus infection) ([place])   AIDS (acquired immune deficiency syndrome) ([place])   Noncompliance   Bacterial meningitis   Hypertensive urgency   Septic shock ([place])   Transaminitis   Cocaine abuse   Open wound   Acute respiratory failure ([place])  Altered mental status   Acute  encephalopathy   Cerebral embolism with cerebral infarction   Endotracheally intubated   Meningoencephalitis   Neurosyphilis   Herpesviral meningitis   Paraplegia (HCC)   Acute pulmonary edema (HCC)   Transverse myelitis (HCC)   Encounter for orogastric (OG) tube placement   Somnolence   Abdominal distention   Ileus (HCC)   Muscle weakness (generalized)   Acute respiratory failure with hypoxia (HCC)   Tracheostomy status (HCC)   Hypoxemia   Respiratory distress   Agitation   Hypernatremia   Leukocytosis   Acute blood loss anemia   Pain   Palliative care encounter   Goals of care, counseling/discussion   Dysphagia    Discharge Instructions  Discharge Instructions    Amb Referral to Palliative Care    Complete by:  As directed    Call MD for:  difficulty breathing, headache or visual disturbances    Complete by:  As directed    Call MD for:  extreme fatigue    Complete by:  As directed    Call MD for:  hives    Complete by:  As directed    Call MD for:  persistant dizziness or light-headedness    Complete by:  As directed    Call MD for:  persistant nausea and vomiting    Complete by:  As directed    Call MD for:  severe uncontrolled pain    Complete by:  As directed    Call MD for:  temperature >100.4    Complete by:  As directed    Diet - low sodium heart healthy    Complete by:  As directed    Dys 1 (pureed solids), pudding thick liquids via spoon, and meds crushed.   Increase activity slowly    Complete by:  As directed      Allergies as of 01/04/2017      Reactions   Iodine    Ketorolac    unknown   Tylenol [acetaminophen]    unknown      Medication List    STOP taking these medications   azithromycin 600 MG tablet Commonly known as:  ZITHROMAX   clindamycin 300 MG capsule Commonly known as:  CLEOCIN   doxycycline 100 MG capsule Commonly known as:  VIBRAMYCIN   GENVOYA 150-150-200-10 MG Tabs tablet Generic drug:   elvitegravir-cobicistat-emtricitabine-tenofovir   oxyCODONE-acetaminophen 10-325 MG tablet Commonly known as:  PERCOCET   PREZISTA 800 MG tablet Generic drug:  Darunavir Ethanolate   TRUVADA 200-300 MG tablet Generic drug:  emtricitabine-tenofovir     TAKE these medications   acetaminophen 325 MG tablet Commonly known as:  TYLENOL Take 2 tablets (650 mg total) by mouth every 4 (four) hours as needed for mild pain (temp > 101.5).   aspirin 325 MG tablet Take 1 tablet (325 mg total) by mouth daily.   clonazePAM 1 MG tablet Commonly known as:  KLONOPIN Take 1 tablet (1 mg total) by mouth 3 (three) times daily as needed for anxiety.   darunavir-cobicistat 800-150 MG tablet Commonly known as:  PREZCOBIX Take 1 tablet by mouth daily with breakfast. Swallow whole. Do NOT crush, break or chew tablets. Take with food.   diphenhydrAMINE 25 MG tablet Commonly known as:  SOMINEX Take 25 mg by mouth every 6 (six) hours as needed for itching.   docusate 50 MG/5ML liquid Commonly known as:  COLACE Take 10 mLs (100 mg total) by mouth 2 (two) times daily as  needed for mild constipation.   dolutegravir 50 MG tablet Commonly known as:  TIVICAY Take 1 tablet (50 mg total) by mouth daily.   emtricitabine-tenofovir AF 200-25 MG tablet Commonly known as:  DESCOVY Take 1 tablet by mouth daily.   fentaNYL 25 MCG/HR patch Commonly known as:  DURAGESIC - dosed mcg/hr Place 1 patch (25 mcg total) onto the skin every 3 (three) days.   fluconazole 200 MG tablet Commonly known as:  DIFLUCAN Take 1 tablet (200 mg total) by mouth daily.   gabapentin 600 MG tablet Commonly known as:  NEURONTIN Take 600 mg by mouth 3 (three) times daily.   guaiFENesin-dextromethorphan 100-10 MG/5ML syrup Commonly known as:  ROBITUSSIN DM Take 5 mLs by mouth every 4 (four) hours as needed for cough.   insulin aspart 100 UNIT/ML injection Commonly known as:  NOVOLOG CBG < 70: implement hypoglycemia protocol   CBG 70 - 120: 0 units  CBG 121 - 150: 2 units  CBG 151 - 200: 3 units  CBG 201 - 250: 5 units  CBG 251 - 300: 7 units  CBG 301 - 350: 10 units  CBG 351 - 400: 12 units  CBG > 400 call MD and obtain STAT lab verification   insulin glargine 100 UNIT/ML injection Commonly known as:  LANTUS Inject 0.15 mLs (15 Units total) into the skin 2 (two) times daily.   ipratropium-albuterol 0.5-2.5 (3) MG/3ML Soln Commonly known as:  DUONEB Take 3 mLs by nebulization every 4 (four) hours as needed (shortness of breathe).   oxyCODONE 5 MG immediate release tablet Commonly known as:  Oxy IR/ROXICODONE Take 1 tablet (5 mg total) by mouth every 4 (four) hours as needed for severe pain.   pantoprazole sodium 40 mg/20 mL Pack Commonly known as:  PROTONIX Take 20 mLs (40 mg total) by mouth daily.   polyvinyl alcohol 1.4 % ophthalmic solution Commonly known as:  LIQUIFILM TEARS Place 1 drop into both eyes daily as needed for dry eyes.   sulfamethoxazole-trimethoprim 200-40 MG/5ML suspension Commonly known as:  BACTRIM,SEPTRA Take 20 mLs by mouth daily.            Durable Medical Equipment        Start     Ordered   12/28/16 [phone]  For home use only DME Hospital bed  Once    Question Answer Comment  The above medical condition requires: Patient requires the ability to reposition frequently   Head must be elevated greater than: 30 degrees   Bed type Semi-electric      12/28/16 0957   12/28/16 0958  For home use only DME wheelchair cushion (seat and back)  Once     12/28/16 0957   12/28/16 0958  For home use only DME standard manual wheelchair with seat cushion  Once    Comments:  Patient suffers from  transverse myelitis which impairs their ability to perform daily activities like ambulation in the home.  A walker, cane will not resolve  issue with performing activities of daily living. A wheelchair will allow patient to safely perform daily activities. Patient can safely propel the wheelchair  in the home or has a caregiver who can provide assistance.  Accessories: elevating leg rests (ELRs), wheel locks, extensions and anti-tippers.   12/28/16 0957      Contact information for follow-up providers    COMER, ROBERT, MD. Schedule an appointment as soon as possible for a visit in 1 month(s).   Specialty:  Infectious Diseases  Contact information: [address] [address] [phone]            Contact information for after-discharge care    [facility] .   Specialty:  Skilled Nursing Facility Contact information: [address] [address] [phone]                 Allergies  Allergen Reactions  . Iodine   . Ketorolac     unknown  . Tylenol [Acetaminophen]     unknown    Consultations: Infectious disease. Critical care medicine. Neurology. Psychiatry  Subjective: The patient was seen and examined at bedside. He is sitting on chair. The tracheostomy tube is decannulated. He is breathing well. Denied headache, dizziness, nausea, vomiting, chest pain or shortness of breath. He wanted to be discharged from the hospital.  Discharge Exam: Vitals:   01/04/17 0319 01/04/17 0437  BP:  (!) 144/82  Pulse: 75 87  Resp: 18 18  Temp:  99.7 F (37.6 C)   Vitals:   01/03/17 2010 01/03/17 2344 01/04/17 0319 01/04/17 0437  BP: 138/80   (!) 144/82  Pulse: 76 72 75 87  Resp: _0 Temp: 98 F (36.7 C)   99.7 F (37.6 C)  TempSrc: Oral   Oral  SpO2: 95% 94% 94% 92%  Weight:      Height:        General: Pt is alert, awake, not in acute distress Neck: Tracheostomy tube site has dressing on. Cardiovascular: RRR, S1/S2 +, no rubs, no gallops Respiratory: CTA bilaterally, no wheezing, no rhonchi Abdominal: Soft, NT, ND, bowel sounds + Extremities: no edema, no cyanosis Psychiatric: Judgment and insight appear impaired   The results of significant diagnostics from  this hospitalization (including imaging, microbiology, ancillary and laboratory) are listed below for reference.     Microbiology: No results found for this or any previous visit (from the past 240 hour(s)).   Labs: BNP (last 3 results) No results for input(s): BNP in the last 8760 hours. Basic Metabolic Panel:  Recent Labs Lab 12/30/16 0046 12/31/16 0546 01/02/17 0600  NA 134* 136 136  K 4.8 4.2 4.3  CL 102 103 97*  CO2 _1 GLUCOSE 319* 94 72  BUN 34* 27* 20  CREATININE 1.25* 1.18 1.31*  CALCIUM 8.8* 8.8* 8.5*   Liver Function Tests:  Recent Labs Lab 12/31/16 0546 01/02/17 0600  AST 33 47*  ALT 62 62  ALKPHOS 87 81  BILITOT 0.6 0.6  PROT 6.8 6.6  ALBUMIN 2.1* 2.0*   No results for input(s): LIPASE, AMYLASE in the last 168 hours. No results for input(s): AMMONIA in the last 168 hours. CBC:  Recent Labs Lab 12/31/16 0546 01/02/17 0600  WBC 12.4* 7.9  HGB 12.4* 11.5*  HCT 38.7* 35.7*  MCV 95.3 94.4  PLT 239 203   Cardiac Enzymes: No results for input(s): CKTOTAL, CKMB, CKMBINDEX, TROPONINI in the last 168 hours. BNP: Invalid input(s): POCBNP CBG:  Recent Labs Lab 01/03/17 2008 01/03/17 2348 01/04/17 0433 01/04/17 0822 01/04/17 1200  GLUCAP 74 138* 84 108* 126*   D-Dimer No results for input(s): DDIMER in the last 72 hours. Hgb A1c No results for input(s): HGBA1C in the last 72 hours. Lipid Profile No results for input(s): CHOL, HDL, LDLCALC, TRIG, CHOLHDL, LDLDIRECT in the last 72 hours. Thyroid function studies No results for input(s): TSH, T4TOTAL, T3FREE, THYROIDAB in the  last 72 hours.  Invalid input(s): FREET3 Anemia work up No results for input(s): VITAMINB12, FOLATE, FERRITIN, TIBC, IRON, RETICCTPCT in the last 72 hours. Urinalysis No results found for: COLORURINE, APPEARANCEUR, LABSPEC, [place], GLUCOSEU, HGBUR, BILIRUBINUR, KETONESUR, PROTEINUR, UROBILINOGEN, NITRITE, LEUKOCYTESUR Sepsis Labs Invalid input(s):  PROCALCITONIN,  WBC,  LACTICIDVEN Microbiology No results found for this or any previous visit (from the past 240 hour(s)).   Time coordinating discharge: 35 minutes  SIGNED:   Rosita Fire, MD  [facility] 01/04/2017, 1:47 PM  If 7PM-7AM, please contact night-coverage [email or web address] Password [email or web address]

## 2017-01-05 ENCOUNTER — Encounter: Payer: Self-pay | Admitting: Internal Medicine

## 2017-01-05 ENCOUNTER — Other Ambulatory Visit: Payer: Self-pay | Admitting: *Deleted

## 2017-01-05 MED ORDER — OXYCODONE HCL 5 MG PO TABS: 5.0000 mg | ORAL_TABLET | ORAL | 0 refills | Status: DC | PRN

## 2017-01-05 NOTE — Progress Notes (Signed)
Location:   [facility] Room Number: 151/P Place of Service:  SNF (31) Provider:  Edmon Crape  No primary care provider on file.  No care team member to display  Extended Emergency Contact Information Primary Emergency Contact: ?,[facility] Address: [address]          [place], [place] [address] [place] of [place] Home Phone: [phone] Relation: None Secondary Emergency Contact: Rae [place] of [place] Home Phone: [phone] Mobile Phone: [phone] Relation: Mother  Code Status:  DNR Goals of care: Advanced Directive information Advanced Directives 01/05/2017  Does Patient Have a Medical Advance Directive? Yes  Type of Advance Directive Out of facility DNR (pink MOST or yellow form)  Does patient want to make changes to medical advance directive? No - Patient declined  Would patient like information on creating a medical advance directive? No - Patient declined     Chief Complaint  Patient presents with  . Acute Visit    HPI:  Pt is a 51 y.o. male seen today for an acute visit for    Past Medical History:  Diagnosis Date  . COPD (chronic obstructive pulmonary disease) (HCC)   . Genital warts   . HIV (human immunodeficiency virus infection) (HCC)   . Osteoporosis    Past Surgical History:  Procedure Laterality Date  . PROSTATE SURGERY  11/17/2016    Allergies  Allergen Reactions  . Iodine   . Ketorolac     unknown  . Tylenol [Acetaminophen]     unknown    Allergies as of 01/05/2017      Reactions   Iodine    Ketorolac    unknown   Tylenol [acetaminophen]    unknown      Medication List       Accurate as of 01/05/17 10:55 AM. Always use your most recent med list.          aspirin 325 MG tablet Take 1 tablet (325 mg total) by mouth daily.   clonazePAM 1 MG tablet Commonly known as:  KLONOPIN Take 1 tablet (1 mg total) by mouth 3 (three) times daily as needed for anxiety.     darunavir-cobicistat 800-150 MG tablet Commonly known as:  PREZCOBIX Take 1 tablet by mouth daily with breakfast. Swallow whole. Do NOT crush, break or chew tablets. Take with food.   diphenhydrAMINE 25 MG tablet Commonly known as:  SOMINEX Take 25 mg by mouth every 6 (six) hours as needed for itching.   docusate 50 MG/5ML liquid Commonly known as:  COLACE Take 10 mLs (100 mg total) by mouth 2 (two) times daily as needed for mild constipation.   dolutegravir 50 MG tablet Commonly known as:  TIVICAY Take 1 tablet (50 mg total) by mouth daily.   emtricitabine-tenofovir AF 200-25 MG tablet Commonly known as:  DESCOVY Take 1 tablet by mouth daily.   fentaNYL 25 MCG/HR patch Commonly known as:  DURAGESIC - dosed mcg/hr Place 1 patch (25 mcg total) onto the skin every 3 (three) days.   fluconazole 200 MG tablet Commonly known as:  DIFLUCAN Take 1 tablet (200 mg total) by mouth daily.   gabapentin 600 MG tablet Commonly known as:  NEURONTIN Take 600 mg by mouth 3 (three) times daily.   guaiFENesin-dextromethorphan 100-10 MG/5ML syrup Commonly known as:  ROBITUSSIN DM Take 5 mLs by mouth every 4 (four) hours as needed for cough.   insulin aspart 100 UNIT/ML injection Commonly known as:  NOVOLOG CBG <  70: implement hypoglycemia protocol  CBG 70 - 120: 0 units  CBG 121 - 150: 2 units  CBG 151 - 200: 3 units  CBG 201 - 250: 5 units  CBG 251 - 300: 7 units  CBG 301 - 350: 10 units  CBG 351 - 400: 12 units  CBG > 400 call MD and obtain STAT lab verification   insulin glargine 100 UNIT/ML injection Commonly known as:  LANTUS Inject 0.15 mLs (15 Units total) into the skin 2 (two) times daily.   ipratropium-albuterol 0.5-2.5 (3) MG/3ML Soln Commonly known as:  DUONEB Take 3 mLs by nebulization every 4 (four) hours as needed (shortness of breathe).   oxyCODONE 5 MG immediate release tablet Commonly known as:  Oxy IR/ROXICODONE Take 1 tablet (5 mg total) by mouth every 4 (four)  hours as needed for severe pain.   pantoprazole sodium 40 mg/20 mL Pack Commonly known as:  PROTONIX Take 20 mLs (40 mg total) by mouth daily.   polyvinyl alcohol 1.4 % ophthalmic solution Commonly known as:  LIQUIFILM TEARS Place 1 drop into both eyes daily as needed for dry eyes.   sulfamethoxazole-trimethoprim 200-40 MG/5ML suspension Commonly known as:  BACTRIM,SEPTRA Take 20 mLs by mouth daily.       Review of Systems   There is no immunization history on file for this patient. Pertinent  Health Maintenance Due  Topic Date Due  . COLONOSCOPY  12/06/2015  . INFLUENZA VACCINE  06/08/2016   No flowsheet data found. Functional Status Survey:    Vitals:   01/05/17 1013  BP: (!) 179/86  Pulse: 99  Resp: (!) 22  Temp: 98.9 F (37.2 C)  TempSrc: Oral  SpO2: 100%   There is no height or weight on file to calculate BMI. Physical Exam  Labs reviewed:  Recent Labs  12/12/16 0319 12/13/16 0219 12/15/16 0439  12/19/16 0910  12/30/16 0046 12/31/16 0546 01/02/17 0600  NA 133* 136 138  < > 149*  < > 134* 136 136  K 5.2* 4.1 3.5  < > 3.5  < > 4.8 4.2 4.3  CL 90* 97* 97*  < > 113*  < > 102 103 97*  CO2 34* 35* 30  < > 27  < > 23 26 28   GLUCOSE 245* 223* 219*  < > 217*  < > 319* 94 72  BUN 23* 24* 28*  < > 51*  < > 34* 27* 20  CREATININE 0.83 0.82 0.79  < > 1.16  < > 1.25* 1.18 1.31*  CALCIUM 8.1* 8.4* 9.1  < > 9.3  < > 8.8* 8.8* 8.5*  MG 1.7 2.0  --   --  2.5*  --   --   --   --   PHOS 3.2 2.6 3.8  --   --   --   --   --   --   < > = values in this interval not displayed.  Recent Labs  12/26/16 0622 12/31/16 0546 01/02/17 0600  AST 55* 33 47*  ALT 83* 62 62  ALKPHOS 62 87 81  BILITOT 1.1 0.6 0.6  PROT 6.9 6.8 6.6  ALBUMIN 2.0* 2.1* 2.0*    Recent Labs  11/28/16 2205 12/02/16 0009  12/27/16 0511 12/31/16 0546 01/02/17 0600  WBC 10.9* 20.1*  < > 7.6 12.4* 7.9  NEUTROABS 7.0 18.7*  --   --   --   --   HGB 9.1* 9.0*  < > 12.1* 12.4* 11.5*  HCT  27.4* 28.2*  < > 38.0* 38.7* 35.7*  MCV 95.8 97.2  < > 94.8 95.3 94.4  PLT 260 229  < > 289 239 203  < > = values in this interval not displayed. Lab Results  Component Value Date   TSH 0.982 12/19/2016   Lab Results  Component Value Date   HGBA1C 6.6 (H) 12/03/2016   Lab Results  Component Value Date   CHOL 141 12/09/2016   HDL 16 (L) 12/09/2016   LDLCALC 93 12/09/2016   TRIG 217 (H) 12/12/2016   CHOLHDL 8.8 12/09/2016    Significant Diagnostic Results in last 30 days:  Ct Abdomen Pelvis Wo Contrast  Result Date: 12/17/2016 CLINICAL DATA:  Bowel obstruction EXAM: CT ABDOMEN AND PELVIS WITHOUT CONTRAST TECHNIQUE: Multidetector CT imaging of the abdomen and pelvis was performed following the standard protocol without IV contrast. COMPARISON:  12/16/2016, CT 11/29/2016 FINDINGS: Lower chest: Atelectasis or partial consolidation in the right lower lobe. Dense partial left lower lobe consolidation concerning for a pneumonia. Normal heart size. Hepatobiliary: No focal hepatic abnormality. The gallbladder is enlarged up to 5.1 cm. No calcified stones. No wall thickening. No biliary dilatation. Pancreas: Unremarkable. No pancreatic ductal dilatation or surrounding inflammatory changes. Spleen: Normal in size without focal abnormality. Adrenals/Urinary Tract: Adrenal glands are unremarkable. Kidneys are normal, without renal calculi, focal lesion, or hydronephrosis. Bladder contains a Foley catheter Stomach/Bowel: Esophageal tube is present in the stomach and the tip terminates in the proximal duodenum. There are borderline to slightly dilated fluid-filled loops of small bowel throughout the abdomen. There is enlarged fluid filled right colon. No wall thickening. The appendix is not well identified. Vascular/Lymphatic: Non aneurysmal aorta. No grossly enlarged lymph nodes. Reproductive: No masses. Other: No free air.  No free fluid. Musculoskeletal: No acute or suspicious bone lesions IMPRESSION: 1.  Borderline to mildly enlarged loops of small bowel throughout the abdomen with dilated fluid-filled right colon and transverse colon, findings are suggestive of an ileus. Partial bowel obstruction included in the differential but felt less likely. 2. Partial consolidation in the left lower lobe concerning for pneumonia. Electronically Signed   By: Jasmine Pang M.D.   On: 12/17/2016 01:36   Dg Chest 1 View  Result Date: 12/26/2016 CLINICAL DATA:  Altered mental status.  HIV disease EXAM: CHEST 1 VIEW COMPARISON:  December 23, 2016 FINDINGS: Tracheostomy catheter tip is 7.0 cm above the carina. No pneumothorax. There is airspace consolidation throughout much the left lower lobe which was also present previously but appears somewhat more coalesced that this time. There is a small left pleural effusion. Right lung is clear. Heart size and pulmonary vascularity are normal. No adenopathy. No bone lesions. IMPRESSION: Airspace consolidation consistent with pneumonia left lower lobe with small left pleural effusion. Lungs elsewhere clear. Tracheostomy as described. No pneumothorax. Cardiac silhouette within normal limits. Electronically Signed   By: Bretta Bang III M.D.   On: 12/26/2016 09:00   Dg Abd 1 View  Result Date: 12/23/2016 CLINICAL DATA:  Feeding tube placement EXAM: ABDOMEN - 1 VIEW COMPARISON:  December 22, 2016 FINDINGS: Feeding tube tip is at the junction of the second and third portions of the duodenum. Bowel gas pattern is normal. No bowel obstruction. No free air. Moderate stool is present in the colon. IMPRESSION: Feeding tube tip at junction of second and third portions of duodenum. Bowel gas pattern normal. Electronically Signed   By: Bretta Bang III M.D.   On: 12/23/2016 13:45  Mr Shirlee Latch NU Contrast  Result Date: 12/09/2016 CLINICAL DATA:  Altered mental status, follow-up multiple embolic infarcts. Cerebral spinal fluid positive for HSV. History of HIV/aids. EXAM: MRA HEAD  WITHOUT CONTRAST MRA NECK WITHOUT AND WITH CONTRAST TECHNIQUE: Angiographic images of the Circle of Willis were obtained using MRA technique without intravenous contrast. Angiographic images of the neck were obtained using MRA technique without and with intravenous contrast. Carotid stenosis measurements (when applicable) are obtained utilizing NASCET criteria, using the distal internal carotid diameter as the denominator. CONTRAST:  20 cc MultiHance COMPARISON:  MRI of the head December 04, 2016 FINDINGS: MRA HEAD FINDINGS ANTERIOR CIRCULATION: Thready irregular RIGHT cervical internal carotid artery through the level of the carotid siphon. Robust LEFT internal carotid artery. Bilateral anterior cerebral arteries arise from LEFT A1-2 junction. Normal appearance of the anterior and middle cerebral arteries. No large vessel occlusion, high-grade stenosis, abnormal luminal irregularity, aneurysm. POSTERIOR CIRCULATION: LEFT vertebral artery is dominant. Basilar artery is patent, with normal flow related enhancement of the main branch vessels. Normal flow related enhancement of the posterior cerebral arteries. No large vessel occlusion, high-grade stenosis, abnormal luminal irregularity, aneurysm. ANATOMIC VARIANTS: None. MRA NECK FINDINGS- delayed phase, venous contamination. ANTERIOR CIRCULATION: The common carotid arteries are widely patent bilaterally. The carotid bifurcation is patent bilaterally and there is no hemodynamically significant carotid stenosis by NASCET criteria. Beaded appearance of the RIGHT cervical internal carotid artery to the intracranial segments. POSTERIOR CIRCULATION: Bilateral vertebral arteries are patent to the vertebrobasilar junction. No evidence for atherosclerosis or flow limiting stenosis. Bilateral posterior communicating arteries present. Source images and MIP image were reviewed. IMPRESSION: MRA HEAD: Irregular RIGHT internal carotid artery, vessel remains patent. No emergent large  vessel occlusion or severe stenosis. No intracranial vasculopathy by MRA though catheter angiography is more sensitive. MRA NECK: Limited due to delayed phase. Beaded appearance of the RIGHT cervical internal carotid artery most compatible with fibromuscular dysplasia though focal HIV vasculopathy is possible. Electronically Signed   By: Awilda Metro M.D.   On: 12/09/2016 02:37   Mr Maxine Glenn Neck W Contrast  Result Date: 12/09/2016 CLINICAL DATA:  Altered mental status, follow-up multiple embolic infarcts. Cerebral spinal fluid positive for HSV. History of HIV/aids. EXAM: MRA HEAD WITHOUT CONTRAST MRA NECK WITHOUT AND WITH CONTRAST TECHNIQUE: Angiographic images of the Circle of Willis were obtained using MRA technique without intravenous contrast. Angiographic images of the neck were obtained using MRA technique without and with intravenous contrast. Carotid stenosis measurements (when applicable) are obtained utilizing NASCET criteria, using the distal internal carotid diameter as the denominator. CONTRAST:  20 cc MultiHance COMPARISON:  MRI of the head December 04, 2016 FINDINGS: MRA HEAD FINDINGS ANTERIOR CIRCULATION: Thready irregular RIGHT cervical internal carotid artery through the level of the carotid siphon. Robust LEFT internal carotid artery. Bilateral anterior cerebral arteries arise from LEFT A1-2 junction. Normal appearance of the anterior and middle cerebral arteries. No large vessel occlusion, high-grade stenosis, abnormal luminal irregularity, aneurysm. POSTERIOR CIRCULATION: LEFT vertebral artery is dominant. Basilar artery is patent, with normal flow related enhancement of the main branch vessels. Normal flow related enhancement of the posterior cerebral arteries. No large vessel occlusion, high-grade stenosis, abnormal luminal irregularity, aneurysm. ANATOMIC VARIANTS: None. MRA NECK FINDINGS- delayed phase, venous contamination. ANTERIOR CIRCULATION: The common carotid arteries are widely  patent bilaterally. The carotid bifurcation is patent bilaterally and there is no hemodynamically significant carotid stenosis by NASCET criteria. Beaded appearance of the RIGHT cervical internal carotid artery to the intracranial segments. POSTERIOR  CIRCULATION: Bilateral vertebral arteries are patent to the vertebrobasilar junction. No evidence for atherosclerosis or flow limiting stenosis. Bilateral posterior communicating arteries present. Source images and MIP image were reviewed. IMPRESSION: MRA HEAD: Irregular RIGHT internal carotid artery, vessel remains patent. No emergent large vessel occlusion or severe stenosis. No intracranial vasculopathy by MRA though catheter angiography is more sensitive. MRA NECK: Limited due to delayed phase. Beaded appearance of the RIGHT cervical internal carotid artery most compatible with fibromuscular dysplasia though focal HIV vasculopathy is possible. Electronically Signed   By: Awilda Metro M.D.   On: 12/09/2016 02:37   Mr Brain Wo Contrast  Result Date: 12/26/2016 CLINICAL DATA:  Altered mental status EXAM: MRI HEAD WITHOUT CONTRAST TECHNIQUE: Multiplanar, multiecho pulse sequences of the brain and surrounding structures were obtained without intravenous contrast. COMPARISON:  12/09/2016 FINDINGS: Brain: Resolved areas of restricted diffusion. On sagittal T1 weighted imaging there are small areas of T1 hyperintensity along the areas of previously described infarct along the sylvian fissures, likely cortical laminar necrosis. No blood products are noted. Mild FLAIR hyperintensity in the regions of fourth ventricular and left inferior cerebellar disease previously. No signs of acute or interval infarct. No swelling or evidence of sulcal/intraventricular debris. No hydrocephalus or mass. Vascular: Preserved flow voids Skull and upper cervical spine: Negative Sinuses/Orbits: Fluid levels in bilateral maxillary and sphenoid sinuses and in the left more than right  mastoids. IMPRESSION: 1. No acute or interval finding. Acute findings on previous exam are resolved. 2. Expected evolution of small scattered infarcts seen previously. 3. Bilateral paranasal sinus and mastoid fluid levels. Electronically Signed   By: Marnee Spring M.D.   On: 12/26/2016 08:32   Mr Cervical Spine W Wo Contrast  Result Date: 12/11/2016 CLINICAL DATA:  Paraplegia. Evaluate for abscess or post LP epidural hematoma. HIV and cocaine use. EXAM: MRI TOTAL SPINE WITHOUT AND WITH CONTRAST TECHNIQUE: Multisequence MR imaging of the spine from the cervical spine to the sacrum was performed prior to and following IV contrast administration for evaluation of spinal metastatic disease. CONTRAST:  20mL MULTIHANCE GADOBENATE DIMEGLUMINE 529 MG/ML IV SOLN COMPARISON:  None. FINDINGS: MRI CERVICAL SPINE FINDINGS Alignment: Physiologic. Vertebrae: No fracture, evidence of discitis, or bone lesion. Hypointense marrow usually from anemia. Cord: The cord at has normal signal and morphology, but there is diffuse superficial enhancement in which also extends along the bilateral nerve roots. 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 There is also folia enhancement and superficial brainstem enhancement compatible with meningitis. Intubation with fluid throughout the paranasal sinuses and airway above the cuff. Disc levels: No herniation or impingement. MRI THORACIC SPINE FINDINGS Alignment:  Physiologic. Vertebrae: No fracture, evidence of discitis, or bone lesion. Hypointense marrow likely from anemia Cord: Predominately dorsal dural thickening throughout the thoracic canal. Diffuse cord signal abnormality with patchy appearance mainly along the posterior half of the peripheral cord. No notable swelling. Some of these areas  enhance, including at in the T5 and T6 region. Direct infection or patchy infarct from perforators (expected the dorsal cord vessels would be greater involved in this patient who has been intubated and supine) Paraspinal and other soft tissues: Consolidation in both lower lobes. Disc levels: No herniation or impingement. MRI LUMBAR SPINE FINDINGS Segmentation:  Standard. Alignment:  Physiologic. Vertebrae: No fracture, evidence of discitis, or bone lesion. Hypointense marrow likely from anemia.  Conus medullaris: Extends to the L1 level and appears normal. Paraspinal and other soft tissues: Negative Disc levels: Mild disc desiccation and bulging at L3-4 and L4-5. Findings are most likely infectious, both by imaging and based on clinical circumstances. Neurosarcoid can have similar findings, but usually and a different clinical setting. There is no specific imaging feature to differentiate bacterial, viral, and helminth infection. IMPRESSION: 1. No abscess or hematoma as questioned clinically. 2. Generalized meningitis and radiculitis. 3. 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 4. Findings are nonspecific. In addition to the pathogens considered in the chart, would also check CMV. Neurosarcoid can have the same findings, but usually in a different clinical setting. Electronically Signed   By: Marnee Spring M.D.   On: 12/11/2016 13:49   Mr Thoracic Spine W Wo Contrast  Result Date: 12/11/2016 CLINICAL DATA:  Paraplegia. Evaluate for abscess or post LP epidural hematoma. HIV and cocaine use. EXAM: MRI TOTAL SPINE WITHOUT AND WITH CONTRAST TECHNIQUE: Multisequence MR imaging of the spine from the cervical spine to the sacrum was performed prior to and following IV contrast administration for evaluation of spinal metastatic disease. CONTRAST:  20mL MULTIHANCE GADOBENATE DIMEGLUMINE 529 MG/ML IV SOLN COMPARISON:  None. FINDINGS: MRI CERVICAL SPINE FINDINGS Alignment:  Physiologic. Vertebrae: No fracture, evidence of discitis, or bone lesion. Hypointense marrow usually from anemia. Cord: The cord at has normal signal and morphology, but there is diffuse superficial enhancement in which also extends along the bilateral nerve roots. 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 There is also folia enhancement and superficial brainstem enhancement compatible with meningitis. Intubation with fluid throughout the paranasal sinuses and airway above the cuff. Disc levels: No herniation or impingement. MRI THORACIC SPINE FINDINGS Alignment:  Physiologic. Vertebrae: No fracture, evidence of discitis, or bone lesion. Hypointense marrow likely from anemia Cord: Predominately dorsal dural thickening throughout the thoracic canal. Diffuse cord signal abnormality with patchy appearance mainly along the posterior half of the peripheral cord. No notable swelling. Some of these areas enhance, including at in the T5 and T6 region. Direct infection or patchy infarct from perforators (expected the dorsal cord vessels would be greater involved in this patient who has been intubated and supine) Paraspinal and other soft tissues: Consolidation in both lower lobes. Disc levels: No herniation or impingement. MRI LUMBAR SPINE FINDINGS Segmentation:  Standard. Alignment:  Physiologic. Vertebrae: No fracture, evidence of discitis, or bone lesion. Hypointense marrow likely from anemia. Conus medullaris: Extends to the L1 level and appears normal. Paraspinal and other soft tissues: Negative Disc levels: Mild disc desiccation and bulging at L3-4 and L4-5. Findings are most likely infectious, both by imaging and based on clinical circumstances. Neurosarcoid can have similar findings, but usually and a different clinical  setting. There is no specific imaging feature to differentiate bacterial, viral, and helminth infection. IMPRESSION: 1. No abscess or hematoma as questioned clinically. 2. Generalized meningitis and radiculitis. 3. 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 4. Findings are nonspecific. In addition to the pathogens considered in the chart, would also check CMV. Neurosarcoid can have the same findings, but usually in a different clinical setting. Electronically Signed   By: Marnee Spring M.D.   On: 12/11/2016 13:49   Mr Lumbar Spine W Wo Contrast  Result Date: 12/11/2016 CLINICAL DATA:  Paraplegia. Evaluate for abscess or post LP epidural hematoma. HIV and cocaine use. EXAM: MRI TOTAL SPINE WITHOUT AND WITH CONTRAST TECHNIQUE: Multisequence MR imaging of the spine from the cervical spine to the sacrum was performed prior to and following IV contrast administration for evaluation of spinal metastatic disease. CONTRAST:  20mL MULTIHANCE GADOBENATE DIMEGLUMINE 529 MG/ML IV SOLN COMPARISON:  None. FINDINGS: MRI CERVICAL SPINE FINDINGS Alignment: Physiologic. Vertebrae: No fracture, evidence of discitis, or bone lesion. Hypointense marrow usually from anemia. Cord: The cord at has normal signal and morphology, but there is diffuse superficial enhancement in which also extends along the bilateral nerve roots. 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 There is also folia enhancement and superficial brainstem enhancement compatible with meningitis. Intubation with fluid throughout the paranasal sinuses and airway above the cuff. Disc levels: No herniation or impingement. MRI THORACIC SPINE FINDINGS Alignment:  Physiologic. Vertebrae: No fracture, evidence of discitis, or bone  lesion. Hypointense marrow likely from anemia Cord: Predominately dorsal dural thickening throughout the thoracic canal. Diffuse cord signal abnormality with patchy appearance mainly along the posterior half of the peripheral cord. No notable swelling. Some of these areas enhance, including at in the T5 and T6 region. Direct infection or patchy infarct from perforators (expected the dorsal cord vessels would be greater involved in this patient who has been intubated and supine) Paraspinal and other soft tissues: Consolidation in both lower lobes. Disc levels: No herniation or impingement. MRI LUMBAR SPINE FINDINGS Segmentation:  Standard. Alignment:  Physiologic. Vertebrae: No fracture, evidence of discitis, or bone lesion. Hypointense marrow likely from anemia. Conus medullaris: Extends to the L1 level and appears normal. Paraspinal and other soft tissues: Negative Disc levels: Mild disc desiccation and bulging at L3-4 and L4-5. Findings are most likely infectious, both by imaging and based on clinical circumstances. Neurosarcoid can have similar findings, but usually and a different clinical setting. There is no specific imaging feature to differentiate bacterial, viral, and helminth infection. IMPRESSION: 1. No abscess or hematoma as questioned clinically. 2. Generalized meningitis and radiculitis. 3. 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 4. Findings are nonspecific. In addition to the pathogens considered in the chart, would also check CMV. Neurosarcoid can have the same findings, but usually in a different clinical setting. Electronically Signed   By: Marnee Spring M.D.   On: 12/11/2016 13:49   Dg Chest Port 1 View  Result Date: 12/23/2016 CLINICAL DATA:  51 year old male with history of cough. EXAM: PORTABLE CHEST 1 VIEW COMPARISON:  Chest x-ray 12/19/2016. FINDINGS: Worsening patchy multifocal airspace disease in the left mid to lower lung and at the  right lung base, concerning for progressive multilobar bronchopneumonia. Probable small left pleural effusion appears slightly increased compared to the prior study. No definite right pleural effusion. No evidence of pulmonary edema. Heart size appears borderline to mildly enlarged. Upper mediastinal contours are within normal limits. Feeding tube extends into the stomach, but the tip is below the lower margin of the images. Previously noted nasogastric tube has been removed. Tracheostomy tube in place with tip terminating 5.8 cm above the carina. IMPRESSION: 1. Worsening bilateral lower lobe multilobar bronchopneumonia (left greater than right) with small left parapneumonic pleural effusion. 2. Support apparatus, as above. Electronically Signed   By: Trudie Reed M.D.   On: 12/23/2016 13:20  Dg Chest Port 1 View  Result Date: 12/19/2016 CLINICAL DATA:  Respiratory distress EXAM: PORTABLE CHEST 1 VIEW COMPARISON:  12/16/2016 FINDINGS: Support devices are unchanged. Heart is borderline enlarged. Left lower lobe atelectasis or infiltrate with small left effusion. This is similar to prior study. No focal opacity on the right. IMPRESSION: Stable left lower lobe atelectasis or infiltrate with small left effusion. Electronically Signed   By: Charlett NoseKevin  Dover M.D.   On: 12/19/2016 07:32   Dg Chest Port 1 View  Result Date: 12/16/2016 CLINICAL DATA:  Acute respiratory failure EXAM: PORTABLE CHEST 1 VIEW COMPARISON:  12/15/2016 FINDINGS: Cardiac shadow is stable. Feeding catheter and tracheostomy tube are again seen. The left jugular central line is not well appreciated on this exam. Persistent left retrocardiac density is noted. No focal infiltrate is seen on the right. No pneumothorax is seen. IMPRESSION: Persistent left basilar infiltrate. Electronically Signed   By: Alcide CleverMark  Lukens M.D.   On: 12/16/2016 07:54   Dg Chest Port 1 View  Result Date: 12/15/2016 CLINICAL DATA:  Pulmonary edema. EXAM: PORTABLE CHEST 1  VIEW COMPARISON:  12/13/2016 and 12/12/2016 FINDINGS: Tracheostomy tube is in place. Left jugular vein catheter is unchanged with the tip at the junction of the jugular vein and left innominate vein. Pulmonary vascular congestion has improved. The infiltrate and effusion at the left lung base process. New slight atelectasis at the right lung base. IMPRESSION: 1. A persistent infiltrate and effusion at the left lung base, stable. 2. New slight atelectasis at the right lung base. 3. Improved pulmonary vascular prominence. Electronically Signed   By: Francene BoyersJames  Maxwell M.D.   On: 12/15/2016 07:34   Dg Chest Port 1 View  Result Date: 12/13/2016 CLINICAL DATA:  Respiratory failure EXAM: PORTABLE CHEST 1 VIEW COMPARISON:  12/12/2016 FINDINGS: Tracheostomy tube is noted. Left jugular central line is again seen and stable. The nasogastric catheter has been removed. Left retrocardiac density is noted consistent with atelectasis/infiltrate. Small left pleural effusion is noted. Mild vascular congestion is noted. IMPRESSION: Stable left retrocardiac infiltrate and small effusion. Mild vascular congestion is seen. Electronically Signed   By: Alcide CleverMark  Lukens M.D.   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 Respiratory failure. Initial encounter. EXAM: PORTABLE CHEST 1 VIEW COMPARISON:  12/09/2016 and earlier. FINDINGS: Portable AP semi upright view at 0443 hours. Endotracheal tube tip between the level the clavicles and carina. Enteric tube courses to the abdomen and side hole projects at the level of the gastric body. Stable left IJ central line, tip projects at the subclavian and jugular venous confluence. Stable lung volumes. Stable cardiac size and mediastinal contours. Continued dense retrocardiac opacity obscuring the left hemidiaphragm. Mild superimposed bilateral perihilar reticulonodular opacity. No pneumothorax  or pulmonary edema. Ventilation has not significantly changed since 12/08/2016. IMPRESSION: 1. Stable lines and tubes. Note that the left IJ central line tip is at the confluence of the left subclavian and jugular veins. 2. Since 12/08/2016 there is Left lower lobe collapse or consolidation with superimposed bilateral perihilar reticulonodular opacity. Favor bilateral pneumonia. Electronically Signed   By: Odessa FlemingH  Hall M.D.   On: 12/12/2016 07:05   Dg Chest Port 1 View  Result Date: 12/09/2016 CLINICAL DATA:  Intubation.  Shortness of breath. EXAM: PORTABLE CHEST 1 VIEW COMPARISON:  12/08/2016. FINDINGS: Endotracheal tube and NG tube in stable position. Stable cardiomegaly. Bilateral pulmonary infiltrates/edema are again noted, slightly  progressed from prior exam. Small left pleural effusion noted on today's exam. No pneumothorax . IMPRESSION: 1. Lines and tubes in stable position. 2. Stable cardiomegaly. 3. Bilateral pulmonary infiltrates/edema, slightly progressed from prior exam. Small left pleural effusion noted on today's exam . Electronically Signed   By: Maisie Fus  Register   On: 12/09/2016 07:18   Dg Chest Port 1 View  Result Date: 12/08/2016 CLINICAL DATA:  Intubation. EXAM: PORTABLE CHEST 1 VIEW COMPARISON:  12/07/2016. FINDINGS: Interim advancement of NG tube below left hemidiaphragm. Endotracheal tube, left IJ line in stable position. Cardiomegaly. Bibasilar pulmonary infiltrates. No pleural effusion or pneumothorax. IMPRESSION: 1. Interim advancement of NG tube below left hemidiaphragm. Endotracheal tube and left IJ line stable position. 2.  Bibasilar atelectasis and infiltrates again noted.  No change. Electronically Signed   By: Maisie Fus  Register   On: 12/08/2016 06:48   Dg Chest Port 1 View  Result Date: 12/07/2016 CLINICAL DATA:  Intubated, shortness of breath, respiratory failure EXAM: PORTABLE CHEST 1 VIEW COMPARISON:  12/06/2016 FINDINGS: Endotracheal tube is 5.4 cm above the carina. NG tube  has retracted and is within the mid esophagus. Left jugular peripheral IV is stable in position. Stable heart size and vascularity. Mild streaky bibasilar atelectasis/airspace disease, worse on the left. No significant interval change. No large effusion or pneumothorax. Overall stable exam. IMPRESSION: Interval retraction of the NG tube which is within the mid esophagus. Persistent bibasilar atelectasis/patchy airspace process. Basilar pneumonia not excluded. No developing effusion or pneumothorax. Electronically Signed   By: Judie Petit.  Shick M.D.   On: 12/07/2016 08:01   Dg Abd Portable 1v  Result Date: 12/23/2016 CLINICAL DATA:  Enteric tube placement EXAM: PORTABLE ABDOMEN - 1 VIEW COMPARISON:  Abdominal radiograph from earlier today FINDINGS: Weighted enteric tube terminates in the right upper quadrant of the abdomen in the region of the pylorus. No dilated small bowel loops. No evidence of pneumatosis or pneumoperitoneum. Patchy left lung base opacity. IMPRESSION: Weighted enteric tube terminates in the right upper quadrant in the region of the pylorus. Nonobstructive bowel gas pattern. Patchy left lung base opacity. Electronically Signed   By: Delbert Phenix M.D.   On: 12/23/2016 17:19   Dg Abd Portable 1v  Result Date: 12/22/2016 CLINICAL DATA:  Assess feeding tube position. EXAM: PORTABLE ABDOMEN - 1 VIEW COMPARISON:  KUB of December 20, 2016 FINDINGS: The radiodense tip of the feeding tube lies in the region of the pylorus or first portion of the duodenum. There is some looping of the feeding tube in the gastric cardia. The bowel gas pattern is within the limits of normal. The bony structures exhibit no acute abnormalities. IMPRESSION: There has been distal migration of the feeding tube since at the tip lies in the region of the pylorus or first portion of the duodenum. Electronically Signed   By: David  Swaziland M.D.   On: 12/22/2016 15:31   Dg Abd Portable 1v  Result Date: 12/20/2016 CLINICAL DATA:   Ileus EXAM: PORTABLE ABDOMEN - 1 VIEW COMPARISON:  To 09/2017 FINDINGS: Feeding tube tip in the duodenum near the ligament of Treitz, unchanged. Small bowel dilatation has progressed. Mild small bowel distention. Gas in the colon without dilatation. NG tube in the stomach. IMPRESSION: Mildly dilated small bowel loops with progression since the prior study. Electronically Signed   By: Marlan Palau M.D.   On: 12/20/2016 06:41   Dg Abd Portable 1v  Result Date: 12/19/2016 CLINICAL DATA:  Ileus EXAM: PORTABLE ABDOMEN - 1 VIEW COMPARISON:  12/18/2016 FINDINGS: NG tube is in the stomach. Feeding tube tip in the distal duodenum. Nonobstructive bowel gas pattern. Gas throughout nondistended large and small bowel. No free air organomegaly. IMPRESSION: Improved bowel distention.  Nonobstructive pattern currently. Feeding tube tip remains in the distal duodenum. Electronically Signed   By: Charlett Nose M.D.   On: 12/19/2016 09:12   Dg Abd Portable 1v  Result Date: 12/18/2016 CLINICAL DATA:  Ileus. EXAM: PORTABLE ABDOMEN - 1 VIEW COMPARISON:  12/17/2016. FINDINGS: Improved gaseous distention of the abdomen. Support tubes and apparatus appears stable. IMPRESSION: Improved gaseous distention. Electronically Signed   By: Elsie Stain M.D.   On: 12/18/2016 08:26   Dg Abd Portable 1v  Result Date: 12/17/2016 CLINICAL DATA:  Followup abdominal distension. EXAM: PORTABLE ABDOMEN - 1 VIEW FINDINGS: There is an NG tube with its tip in the antropyloric region of the stomach. There is also a feeding tube with its tip in the fourth portion of the duodenum. The lung bases are grossly clear. Persistent air distended small bowel and colon suggesting a diffuse ileus. No free air. IMPRESSION: Persistent diffuse ileus bowel gas pattern. NG and feeding tubes in good position. Electronically Signed   By: Rudie Meyer M.D.   On: 12/17/2016 09:39   Dg Abd Portable 1v  Result Date: 12/16/2016 CLINICAL DATA:  Assess nasogastric tube  positioning. EXAM: PORTABLE ABDOMEN - 1 VIEW COMPARISON:  KUB of December 16, 2016 at 9:22 a.m. FINDINGS: The radiodense tipped feeding tube tip projects below the inferior margin of the image. The orogastric tube has its proximal port in the gastric cardia with the tip re-curved upon itself pointing toward the GE junction. There are loops of moderately distended gas-filled small and large bowel present. There is dense atelectasis or pneumonia in the left lower lobe. IMPRESSION: The tip of the feeding tube projects below the inferior margin of the image. Both the proximal port and the tip of the orogastric tube lie in the region of the gastric cardia but the tip of the tube is directed back toward the GE junction. Electronically Signed   By: David  Swaziland M.D.   On: 12/16/2016 15:58   Dg Abd Portable 1v  Result Date: 12/16/2016 CLINICAL DATA:  Abdominal distention EXAM: PORTABLE ABDOMEN - 1 VIEW COMPARISON:  December 14, 2016 FINDINGS: Feeding tube tip is in the third portion of the duodenum, unchanged. There are multiple loops of dilated bowel without bowel wall thickening. No free air evident. IMPRESSION: Bowel gas pattern is concerning for ileus or possibly early bowel obstruction. No demonstrable free air. Feeding tube tip in third portion duodenum. Electronically Signed   By: Bretta Bang III M.D.   On: 12/16/2016 09:37   Dg Abd Portable 1v  Result Date: 12/14/2016 CLINICAL DATA:  Nasogastric tube placement. EXAM: PORTABLE ABDOMEN - 1 VIEW COMPARISON:  12/07/2016 FINDINGS: Nasogastric tube enters the stomach in has its tip at the proximal fourth portion of the duodenum. Upper abdominal bowel gas pattern is unremarkable. Atelectasis persists at the left base. IMPRESSION: Nasogastric tube tip at the proximal fourth portion of the duodenum. Electronically Signed   By: Paulina Fusi M.D.   On: 12/14/2016 16:26   Dg Abd Portable 1v  Result Date: 12/07/2016 CLINICAL DATA:  Adjusted OG tube EXAM: PORTABLE  ABDOMEN - 1 VIEW COMPARISON:  12/01/2016 FINDINGS: Enteric tube terminates in the proximal gastric body. Nonobstructive bowel gas pattern. Visualized osseous structures are within normal limits. IMPRESSION: Enteric tube terminates in the proximal gastric  body. Electronically Signed   By: Charline Bills M.D.   On: 12/07/2016 09:20   Dg Swallowing Func-speech Pathology  Result Date: 12/30/2016 Objective Swallowing Evaluation: Type of Study: MBS-Modified Barium Swallow Study Patient Details Name: Lawrence Kane MRN: [id number] Date of Birth: 12/29/1965 Today's Date: 12/30/2016 Time: SLP Start Time (ACUTE ONLY): 1306-SLP Stop Time (ACUTE ONLY): 1331 SLP Time Calculation (min) (ACUTE ONLY): 25 min Past Medical History: Past Medical History: Diagnosis Date . COPD (chronic obstructive pulmonary disease) (HCC)  . Genital warts  . HIV (human immunodeficiency virus infection) (HCC)  . Osteoporosis  Past Surgical History: Past Surgical History: Procedure Laterality Date . PROSTATE SURGERY  11/17/2016 HPI: 51 year old man with HIV/AIDS (first diagnosed 2003, COPD, and cocaine substance abuse, recent surgery in [place] (?) who presented to [facility] on 1/24 with complaint of altered mental status. Found to be hypertensive to 240/112. 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 MBS 2/19 recommended Dys 1, pudding thick liquids.  No Data Recorded Assessment / Plan / Recommendation CHL IP CLINICAL IMPRESSIONS 12/30/2016 Clinical Impression Swallow function mildy decreased from prior 2/19 with increased motor impairments observed. 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 Mr. Persons exhibited a mod-severe pharyngeal dysphagia characterized by both silent aspiration and penetration with nectar and honey thick liquids, which was present with several trials. Verbal cues  to cough, throat clear, and second swallows did not effectively remove the aspirated/penetrated barium or residue. Delayed swallow initiation to valleculae and pyriform sinuses noted throughout the study due to decreased sensation. Significantly increased vallecular and pyriform sinus residue compared to prior MBS (12/27/16) due to reduced tongue base retraction and laryngeal elevation; impaired cervical esophageal relaxation resulted in barium stasis above and below the UES and slow transit (not observed during 2/19 MBS). Educated pt re: severity of swallow impairments and the high aspiration risk associated primarily with consistencies thinner than pudding thick. Pt was adamant about consuming a regular diet and thin liquids despite SLP education. Recommend Dys 1 (pureed solids), pudding thick liquids via spoon, and meds crushed. MD notified who stated she will speak with pt and pt's mother. ST will f/u. SLP Visit Diagnosis Dysphagia, pharyngeal phase (R13.13);Dysphagia, pharyngoesophageal phase (R13.14) Attention and concentration deficit following -- Frontal lobe and executive function deficit following -- Impact on safety and function Severe aspiration risk   CHL IP TREATMENT RECOMMENDATION 12/30/2016 Treatment Recommendations Therapy as outlined in treatment plan below   Prognosis 12/30/2016 Prognosis for Safe Diet Advancement Fair Barriers to Reach Goals Severity of deficits Barriers/Prognosis Comment -- CHL IP DIET RECOMMENDATION 12/30/2016 SLP Diet Recommendations Pudding thick liquid;Dysphagia 1 (Puree) solids Liquid Administration via Spoon;No straw Medication Administration Crushed with puree Compensations Slow rate;Small sips/bites Postural Changes Seated upright at 90 degrees   CHL IP OTHER RECOMMENDATIONS 12/30/2016 Recommended Consults -- Oral Care Recommendations Oral care BID Other Recommendations Order thickener from pharmacy   CHL IP FOLLOW UP RECOMMENDATIONS 12/30/2016 Follow up Recommendations Other  (comment)   CHL IP FREQUENCY AND DURATION 12/30/2016 Speech Therapy Frequency (ACUTE ONLY) min 2x/week Treatment Duration 2 weeks      CHL IP ORAL PHASE 12/30/2016 Oral Phase WFL Oral - Pudding Teaspoon -- Oral - Pudding Cup -- Oral - Honey Teaspoon -- Oral - Honey Cup WFL Oral - Nectar Teaspoon -- Oral - Nectar Cup WFL Oral - Nectar Straw -- Oral - Thin Teaspoon --  Oral - Thin Cup -- Oral - Thin Straw -- Oral - Puree WFL Oral - Mech Soft -- Oral - Regular -- Oral - Multi-Consistency -- Oral - Pill -- Oral Phase - Comment --  CHL IP PHARYNGEAL PHASE 12/30/2016 Pharyngeal Phase Impaired Pharyngeal- Pudding Teaspoon -- Pharyngeal -- Pharyngeal- Pudding Cup -- Pharyngeal -- Pharyngeal- Honey Teaspoon NT Pharyngeal -- Pharyngeal- Honey Cup Delayed swallow initiation-vallecula;Delayed swallow initiation-pyriform sinuses;Penetration/Aspiration during swallow;Pharyngeal residue - valleculae;Pharyngeal residue - pyriform;Pharyngeal residue - cp segment;Reduced tongue base retraction;Reduced laryngeal elevation Pharyngeal Material enters airway, remains ABOVE vocal cords then ejected out;Material enters airway, passes BELOW cords without attempt by patient to eject out (silent aspiration) Pharyngeal- Nectar Teaspoon NT Pharyngeal -- Pharyngeal- Nectar Cup Delayed swallow initiation-vallecula;Delayed swallow initiation-pyriform sinuses;Penetration/Aspiration during swallow;Pharyngeal residue - valleculae;Pharyngeal residue - pyriform;Pharyngeal residue - cp segment;Reduced tongue base retraction Pharyngeal Material enters airway, passes BELOW cords without attempt by patient to eject out (silent aspiration) Pharyngeal- Nectar Straw -- Pharyngeal -- Pharyngeal- Thin Teaspoon -- Pharyngeal -- Pharyngeal- Thin Cup -- Pharyngeal -- Pharyngeal- Thin Straw -- Pharyngeal -- Pharyngeal- Puree Pharyngeal residue - valleculae;Pharyngeal residue - pyriform;Reduced tongue base retraction;Reduced laryngeal elevation Pharyngeal -- Pharyngeal-  Mechanical Soft -- Pharyngeal -- Pharyngeal- Regular -- Pharyngeal -- Pharyngeal- Multi-consistency -- Pharyngeal -- Pharyngeal- Pill -- Pharyngeal -- Pharyngeal Comment --  CHL IP CERVICAL ESOPHAGEAL PHASE 12/30/2016 Cervical Esophageal Phase Impaired Pudding Teaspoon -- Pudding Cup -- Honey Teaspoon -- Honey Cup Reduced cricopharyngeal relaxation Nectar Teaspoon -- Nectar Cup Reduced cricopharyngeal relaxation Nectar Straw -- Thin Teaspoon -- Thin Cup -- Thin Straw -- Puree Reduced cricopharyngeal relaxation Mechanical Soft -- Regular -- Multi-consistency -- Pill -- Cervical Esophageal Comment -- No flowsheet data found. Royce Macadamia 12/30/2016, 3:15 PM  Breck Coons Lonell Face.Ed CCC-SLP Pager [phone] [phone]             Dg Swallowing Func-speech Pathology  Result Date: 12/27/2016 Objective Swallowing Evaluation: Type of Study: MBS-Modified Barium Swallow Study Patient Details Name: Lawrence Kane MRN: [id number] Date of Birth: [birth year] Today's Date: 12/27/2016 Time: SLP Start Time (ACUTE ONLY): 0930-SLP Stop Time (ACUTE ONLY): 0950 SLP Time Calculation (min) (ACUTE ONLY): 20 min Past Medical History: Past Medical History: Diagnosis Date . COPD (chronic obstructive pulmonary disease) (HCC)  . Genital warts  . HIV (human immunodeficiency virus infection) (HCC)  . Osteoporosis  Past Surgical History: Past Surgical History: Procedure Laterality Date . PROSTATE SURGERY  11/17/2016 HPI: 51 year old man with HIV/AIDS (first diagnosed 2003, COPD, and cocaine substance abuse, recent surgery in [place] (?) who presented to [facility] on 1/24 with complaint of altered mental status. Found to be hypertensive to 240/112. 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 No Data Recorded Assessment / Plan / Recommendation CHL IP CLINICAL IMPRESSIONS 12/27/2016 Clinical Impression  MBS completed wearing Passy-Muir speaking valve. Pharyngeal impairments primarily originate from decreased sensation with minimal motor deficits. Valleculae was site of swallow initiation for most trials. Pyriform sinsus appear shallow and small with min-mild residue silently aspirated after the swallow. Larygneal penetration consistent during the swallow with honey thick barium without awareness. Recommend Dys 1 (puree) only and pudding thick liquids (nothing thinner than puree/pudding), donn speaking valve with all meals/meds, slow rate, small bites.   SLP Visit Diagnosis Dysphagia, pharyngeal phase (R13.13) Attention and concentration deficit following -- Frontal lobe and executive function deficit following -- Impact on safety and function Moderate aspiration risk  CHL IP TREATMENT RECOMMENDATION 12/27/2016 Treatment Recommendations Therapy as outlined in treatment plan below   Prognosis 12/27/2016 Prognosis for Safe Diet Advancement Good Barriers to Reach Goals -- Barriers/Prognosis Comment -- CHL IP DIET RECOMMENDATION 12/27/2016 SLP Diet Recommendations Dysphagia 1 (Puree) solids;Pudding thick liquid Liquid Administration via Spoon Medication Administration Crushed with puree Compensations Slow rate;Small sips/bites Postural Changes Seated upright at 90 degrees   CHL IP OTHER RECOMMENDATIONS 12/27/2016 Recommended Consults -- Oral Care Recommendations Oral care BID Other Recommendations Order thickener from pharmacy   CHL IP FOLLOW UP RECOMMENDATIONS 12/27/2016 Follow up Recommendations (No Data)   CHL IP FREQUENCY AND DURATION 12/27/2016 Speech Therapy Frequency (ACUTE ONLY) min 2x/week Treatment Duration 2 weeks      CHL IP ORAL PHASE 12/27/2016 Oral Phase WFL Oral - Pudding Teaspoon -- Oral - Pudding Cup -- Oral - Honey Teaspoon -- Oral - Honey Cup -- Oral - Nectar Teaspoon -- Oral - Nectar Cup -- Oral - Nectar Straw -- Oral - Thin Teaspoon -- Oral - Thin Cup -- Oral - Thin Straw -- Oral - Puree -- Oral - Mech  Soft -- Oral - Regular -- Oral - Multi-Consistency -- Oral - Pill -- Oral Phase - Comment --  CHL IP PHARYNGEAL PHASE 12/27/2016 Pharyngeal Phase Impaired Pharyngeal- Pudding Teaspoon -- Pharyngeal -- Pharyngeal- Pudding Cup -- Pharyngeal -- Pharyngeal- Honey Teaspoon Delayed swallow initiation-vallecula;Pharyngeal residue - valleculae;Pharyngeal residue - pyriform;Reduced tongue base retraction;Reduced laryngeal elevation;Penetration/Apiration after swallow Pharyngeal Material enters airway, passes BELOW cords without attempt by patient to eject out (silent aspiration);Material enters airway, remains ABOVE vocal cords and not ejected out Pharyngeal- Honey Cup -- Pharyngeal -- Pharyngeal- Nectar Teaspoon Delayed swallow initiation-vallecula;Penetration/Aspiration during swallow;Pharyngeal residue - valleculae;Pharyngeal residue - pyriform;Reduced tongue base retraction;Reduced laryngeal elevation Pharyngeal Material enters airway, passes BELOW cords without attempt by patient to eject out (silent aspiration) Pharyngeal- Nectar Cup -- Pharyngeal -- Pharyngeal- Nectar Straw -- Pharyngeal -- Pharyngeal- Thin Teaspoon -- Pharyngeal -- Pharyngeal- Thin Cup -- Pharyngeal -- Pharyngeal- Thin Straw -- Pharyngeal -- Pharyngeal- Puree Delayed swallow initiation-vallecula Pharyngeal -- Pharyngeal- Mechanical Soft -- Pharyngeal -- Pharyngeal- Regular -- Pharyngeal -- Pharyngeal- Multi-consistency -- Pharyngeal -- Pharyngeal- Pill -- Pharyngeal -- Pharyngeal Comment --  [facility] IP CERVICAL ESOPHAGEAL PHASE 12/27/2016 Cervical Esophageal Phase WFL Pudding Teaspoon -- Pudding Cup -- Honey Teaspoon -- Honey Cup -- Nectar Teaspoon -- Nectar Cup -- Nectar Straw -- Thin Teaspoon -- Thin Cup -- Thin Straw -- Puree -- Mechanical Soft -- Regular -- Multi-consistency -- Pill -- Cervical Esophageal Comment -- No flowsheet data found. Royce Macadamia 12/27/2016, 10:44 AM Breck Coons [facility] Pager [phone]                Assessment/Plan There are no diagnoses linked to this encounter.      [place], [place] [phone]   This encounter was created in error - please disregard.  This encounter was created in error - please disregard.  This encounter was created in error - please disregard.

## 2017-01-05 NOTE — Telephone Encounter (Signed)
[facility] #[phone] Fax: [phone]   

## 2017-01-06 ENCOUNTER — Encounter: Payer: Self-pay | Admitting: Internal Medicine

## 2017-01-06 ENCOUNTER — Encounter (HOSPITAL_COMMUNITY)
Admission: RE | Admit: 2017-01-06 | Discharge: 2017-01-06 | Disposition: A | Discharge status: Home / Self Care | Payer: Medicaid Other | Source: Skilled Nursing Facility | Attending: Internal Medicine | Admitting: Internal Medicine

## 2017-01-06 ENCOUNTER — Non-Acute Institutional Stay (SKILLED_NURSING_FACILITY): Payer: Medicaid Other | Admitting: Internal Medicine

## 2017-01-06 DIAGNOSIS — B20 Human immunodeficiency virus [HIV] disease: Secondary | ICD-10-CM | POA: Insufficient documentation

## 2017-01-06 DIAGNOSIS — J9601 Acute respiratory failure with hypoxia: Secondary | ICD-10-CM | POA: Diagnosis not present

## 2017-01-06 DIAGNOSIS — G373 Acute transverse myelitis in demyelinating disease of central nervous system: Secondary | ICD-10-CM

## 2017-01-06 DIAGNOSIS — E118 Type 2 diabetes mellitus with unspecified complications: Secondary | ICD-10-CM | POA: Insufficient documentation

## 2017-01-06 DIAGNOSIS — D62 Acute posthemorrhagic anemia: Secondary | ICD-10-CM | POA: Insufficient documentation

## 2017-01-06 DIAGNOSIS — G009 Bacterial meningitis, unspecified: Secondary | ICD-10-CM | POA: Insufficient documentation

## 2017-01-06 DIAGNOSIS — B003 Herpesviral meningitis: Secondary | ICD-10-CM | POA: Diagnosis not present

## 2017-01-06 DIAGNOSIS — G0490 Encephalitis and encephalomyelitis, unspecified: Secondary | ICD-10-CM

## 2017-01-06 DIAGNOSIS — D72829 Elevated white blood cell count, unspecified: Secondary | ICD-10-CM | POA: Insufficient documentation

## 2017-01-06 DIAGNOSIS — Z21 Asymptomatic human immunodeficiency virus [HIV] infection status: Secondary | ICD-10-CM

## 2017-01-06 LAB — FUNGUS CULTURE WITH STAIN

## 2017-01-06 LAB — FUNGUS CULTURE RESULT

## 2017-01-06 LAB — FUNGAL ORGANISM REFLEX

## 2017-01-06 NOTE — Progress Notes (Signed)
Location:   [place] Room Number: 151/P Place of Service:  SNF (31) Provider:  [place]ville Lewis  No PCP Per Patient  Patient Care Team: No Pcp Per Patient as PCP - General (General Practice)  Extended Emergency Contact Information Primary Emergency Contact: ?,[facility] Address: [address]          [place], [place] [address] Johnnette Litter of [place] Phone: [phone] Relation: None Secondary Emergency Contact: Herbert Moors [place] of [place] Phone: [phone] Mobile Phone: [phone] Relation: Mother  Code Status:  DNR Goals of care: Advanced Directive information Advanced Directives 01/06/2017  Does Patient Have a Medical Advance Directive? Yes  Type of Advance Directive Out of facility DNR (pink MOST or yellow form)  Does patient want to make changes to medical advance directive? No - Patient declined  Would patient like information on creating a medical advance directive? No - Patient declined     Chief Complaint  Patient presents with  . Acute Visit   Status post hospitalization for HSV meningitis.  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  Also history of HIV infection and oropharyngeal dysphagia  HPI:  Pt is a 51 y.o. male seen today for an acute visit for admission skilled nursing after a lengthy hospitalization with the above issues.  Patient is had a very complicated hospital stay.--He does have a history of COPD HIV substance abuse and noncompliance with recent surgery in [place] he was admitted to [facility] in January 2018 with mental status changes and septic shock because of bacterial meningitis.  He required intubation.   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  Apparently this resolved.  He also completed treatment for ESBL Klibsiella pneumonia.--With sepsis.  He completed antibiotics on 12/30/2016.  He was on a ventilator at one point did undergo tracheostomy placement this has since been removed.  In regards to the scrotal wound with history of genital warts this was treated with IV flucanazole recommendation to continue local wound care.  -HSV meningitis with transverse myelitis.  He was seen by infectious disease and completed a 21 day course of the cycle varies currently on Bactrim for PCP prophylaxis.  He also received high-dose steroids for 5 days with tapering.  There is recommendation for an MRI of the spine in 2-3 weeks.  In regards to HIV infection with hepatitis C virus antibody positive and chronic hepatitis B infection continues on antiviral medications he will need outpatient follow-up with infectious disease  He also has a history diabetes type 2 on the patient denies being a diabetic he has been refusing his insulin nonetheless his blood sugars actually appeared to be stable largely in the lower mid 100s during his stay here so for.  Regards to oropharyngeal dysphagia-he did receive a palliative care consult the hospitall appears her was a family meeting-his situation was discuss with his mother-there was recommendation for dysphagia 1 diet.  Patient wants to be regular but there is a risk of aspiration and choking.  This apparently was discussed with the patient's mother who agreed with the dysphagia diet.  Nursing here has  spoken with the patient's mother who lives in [place] and at some point she would like to take her son up to [place] and be under hospice care-however this is not finalized and there may be some insurance-financial  issues involved which may complicate matters         Past Medical History:  Diagnosis Date  . COPD (chronic obstructive pulmonary disease) ([place])   . Genital warts   . HIV (human immunodeficiency virus infection) ([place])   . Osteoporosis    Past Surgical History:  Procedure Laterality Date  . PROSTATE SURGERY  11/17/2016    Allergies  Allergen Reactions  . Iodine   . Ketorolac     unknown  . Tylenol [Acetaminophen]     unknown    Current Facility-Administered Medications on File Prior to Visit  Medication Dose Route Frequency Provider Last Rate Last Dose  . chlorhexidine gluconate (MEDLINE KIT) (PERIDEX) 0.12 % solution 15 mL  15 mL Mouth Rinse BID [place], MD      . MEDLINE mouth rinse  15 mL Mouth Rinse 10 times per day Rush Landmark, MD       Current Outpatient Prescriptions on File Prior to Visit  Medication Sig Dispense Refill  . aspirin 325 MG tablet Take 1 tablet (325 mg total) by mouth daily. 30 tablet 0  . clonazePAM (KLONOPIN) 1 MG tablet Take 1 tablet (1 mg total) by mouth 3 (three) times daily as needed for anxiety. 12 tablet 0  . darunavir-cobicistat (PREZCOBIX) 800-150 MG tablet Take 1 tablet by mouth daily with breakfast. Swallow whole. Do NOT crush, break or chew tablets. Take with food. 30 tablet 0  . diphenhydrAMINE (SOMINEX) 25 MG tablet Take 25 mg by mouth every 6 (six) hours as needed for itching.    . docusate (COLACE) 50 MG/5ML liquid Take 10 mLs (100 mg total) by mouth 2 (two) times daily as needed for mild constipation. 100 mL 0  . dolutegravir (TIVICAY) 50 MG tablet Take 1 tablet (50 mg total) by mouth daily. 30 tablet 1  . emtricitabine-tenofovir AF (DESCOVY) 200-25 MG tablet Take 1 tablet by mouth daily. 30 tablet 0  . fentaNYL (DURAGESIC - DOSED MCG/HR) 25 MCG/HR patch Place 1 patch (25 mcg total) onto the skin every 3 (three) days. 5 patch 0  . fluconazole (DIFLUCAN) 200 MG tablet Take 1 tablet (200 mg total) by mouth  daily. 30 tablet 0  . gabapentin (NEURONTIN) 600 MG tablet Take 600 mg by mouth 3 (three) times daily.    Marland Kitchen guaiFENesin-dextromethorphan (ROBITUSSIN DM) 100-10 MG/5ML syrup Take 5 mLs by mouth every 4 (four) hours as needed for cough. 118 mL 0  . insulin aspart (NOVOLOG) 100 UNIT/ML injection CBG < 70: implement hypoglycemia protocol  CBG 70 - 120: 0 units  CBG 121 - 150: 2 units  CBG 151 - 200: 3 units  CBG 201 - 250: 5 units  CBG 251 - 300: 7 units  CBG 301 - 350: 10 units  CBG 351 - 400: 12 units  CBG > 400 call MD and obtain STAT lab verification 10 mL 11  . insulin glargine (LANTUS) 100 UNIT/ML injection Inject 0.15 mLs (15 Units total) into the skin 2 (two) times daily. 10 mL 11  . ipratropium-albuterol (DUONEB) 0.5-2.5 (3) MG/3ML SOLN Take 3 mLs by nebulization every 4 (four) hours as needed (shortness of breathe).    Marland Kitchen oxyCODONE (OXY IR/ROXICODONE) 5 MG immediate release tablet Take  1 tablet (5 mg total) by mouth every 4 (four) hours as needed for severe pain. 180 tablet 0  . pantoprazole sodium (PROTONIX) 40 mg/20 mL PACK Take 20 mLs (40 mg total) by mouth daily. 30 each 1  . polyvinyl alcohol (LIQUIFILM TEARS) 1.4 % ophthalmic solution Place 1 drop into both eyes daily as needed for dry eyes.    Marland Kitchen sulfamethoxazole-trimethoprim (BACTRIM,SEPTRA) 200-40 MG/5ML suspension Take 20 mLs by mouth daily. 100 mL 2     Review of Systems  This is somewhat difficult to obtain since patient is not speaking all lab.  In general is not complaining of fever or chills.  Skin is not complaining of itching he does have a history of genital lesions.  Head ears eyes nose mouth and throat does not complain of visual changes is not complaining specifically of sore throat.  Respiratory does not complain of any shortness of breath.  Cardiac is not complaining of chest pain currently.  GI-at times does complain of abdominal discomfort he says this has been present during his hospitalization as  well-per nursing staff he is having regular bowel movements.  GU per nursing he is not having any trouble voiding. There is some history of retention in the past.  He is not complaining of suprapubic tenderness at this time.  Muscle skeletal-continues to have somewhat diffuse pain complaints although these are somewhat difficult to localize.  Neurologic is not complaining of dizziness or headache.  Psych appears mildly agitated with exam per nursing this is not new and he has refused blood draws and CBGs at times and is refusing his insulin    There is no immunization history on file for this patient. Pertinent  Health Maintenance Due  Topic Date Due  . COLONOSCOPY  12/06/2015  . INFLUENZA VACCINE  10/08/2017 (Originally 06/08/2016)   No flowsheet data found. Functional Status Survey:    Vitals:   01/06/17 0954  BP: (!) 147/88  Pulse: (!) 101  Resp: 20  Temp: 97.8 F (36.6 C)  TempSrc: Oral  SpO2: 98%    Physical Exam  In general this is a frail appearing middle-aged male he does not appear to be in any distress. Skin Is warm and dry --he does have a history of genital lesions but apparently are improving . Also  former tracheostomy site is currently covered with dry left leg edema.   dressing  Eyes sclera and conjunctiva appear relatively clear visual acuity appears intact.  Oropharynx he does have some slight brownish white film on his tongue.  Chest largely clear to auscultation there is some scattered rhonchi on expiration and posterior lung fields there is no labored breathing-lung sounds largely clear after coughing.  Heart is distant heart sounds from what I could ascertain was regular rate and rhythm he has minimal lower extremity edema pedal pulses are intact.  Abdomen is soft he is complaining of diffuse tenderness  this has been somewhat chronic---bowel sounds are active  Muscle skeletal does have general frailty but is able to move all extremities  4.  Neurologic appears grossly intact could not really appreciate lateralizing findings limited exam since patient was in bed.  Psych somewhat difficult to fully assess since patient speaks in a whisper but he appears to be grossly alert and oriented    Labs reviewed:  Recent Labs  12/12/16 0319 12/13/16 0219 12/15/16 0439  12/19/16 0910  12/30/16 0046 12/31/16 0546 01/02/17 0600  NA 133* 136 138  < > 149*  < >  134* 136 136  K 5.2* 4.1 3.5  < > 3.5  < > 4.8 4.2 4.3  CL 90* 97* 97*  < > 113*  < > 102 103 97*  CO2 34* 35* 30  < > 27  < > _0 GLUCOSE 245* 223* 219*  < > 217*  < > 319* 94 72  BUN 23* 24* 28*  < > 51*  < > 34* 27* 20  CREATININE 0.83 0.82 0.79  < > 1.16  < > 1.25* 1.18 1.31*  CALCIUM 8.1* 8.4* 9.1  < > 9.3  < > 8.8* 8.8* 8.5*  MG 1.7 2.0  --   --  2.5*  --   --   --   --   PHOS 3.2 2.6 3.8  --   --   --   --   --   --   < > = values in this interval not displayed.  Recent Labs  12/26/16 0622 12/31/16 0546 01/02/17 0600  AST 55* 33 47*  ALT 83* 62 62  ALKPHOS 62 87 81  BILITOT 1.1 0.6 0.6  PROT 6.9 6.8 6.6  ALBUMIN 2.0* 2.1* 2.0*    Recent Labs  11/28/16 2205 12/02/16 0009  12/27/16 0511 12/31/16 0546 01/02/17 0600  WBC 10.9* 20.1*  < > 7.6 12.4* 7.9  NEUTROABS 7.0 18.7*  --   --   --   --   HGB 9.1* 9.0*  < > 12.1* 12.4* 11.5*  HCT 27.4* 28.2*  < > 38.0* 38.7* 35.7*  MCV 95.8 97.2  < > 94.8 95.3 94.4  PLT 260 229  < > 289 239 203  < > = values in this interval not displayed. Lab Results  Component Value Date   TSH 0.982 12/19/2016   Lab Results  Component Value Date   HGBA1C 6.6 (H) 12/03/2016   Lab Results  Component Value Date   CHOL 141 12/09/2016   HDL 16 (L) 12/09/2016   LDLCALC 93 12/09/2016   TRIG 217 (H) 12/12/2016   CHOLHDL 8.8 12/09/2016    Significant Diagnostic Results in last 30 days:  Ct Abdomen Pelvis Wo Contrast  Result Date: 12/17/2016 CLINICAL DATA:  Bowel obstruction EXAM: CT ABDOMEN AND PELVIS WITHOUT  CONTRAST TECHNIQUE: Multidetector CT imaging of the abdomen and pelvis was performed following the standard protocol without IV contrast. COMPARISON:  12/16/2016, CT 11/29/2016 FINDINGS: Lower chest: Atelectasis or partial consolidation in the right lower lobe. Dense partial left lower lobe consolidation concerning for a pneumonia. Normal heart size. Hepatobiliary: No focal hepatic abnormality. The gallbladder is enlarged up to 5.1 cm. No calcified stones. No wall thickening. No biliary dilatation. Pancreas: Unremarkable. No pancreatic ductal dilatation or surrounding inflammatory changes. Spleen: Normal in size without focal abnormality. Adrenals/Urinary Tract: Adrenal glands are unremarkable. Kidneys are normal, without renal calculi, focal lesion, or hydronephrosis. Bladder contains a Foley catheter Stomach/Bowel: Esophageal tube is present in the stomach and the tip terminates in the proximal duodenum. There are borderline to slightly dilated fluid-filled loops of small bowel throughout the abdomen. There is enlarged fluid filled right colon. No wall thickening. The appendix is not well identified. Vascular/Lymphatic: Non aneurysmal aorta. No grossly enlarged lymph nodes. Reproductive: No masses. Other: No free air.  No free fluid. Musculoskeletal: No acute or suspicious bone lesions IMPRESSION: 1. Borderline to mildly enlarged loops of small bowel throughout the abdomen with dilated fluid-filled right colon and transverse colon, findings are suggestive of an ileus. Partial bowel  obstruction included in the differential but felt less likely. 2. Partial consolidation in the left lower lobe concerning for pneumonia. Electronically Signed   By: Donavan Foil M.D.   On: 12/17/2016 01:36   Dg Chest 1 View  Result Date: 12/26/2016 CLINICAL DATA:  Altered mental status.  HIV disease EXAM: CHEST 1 VIEW COMPARISON:  December 23, 2016 FINDINGS: Tracheostomy catheter tip is 7.0 cm above the carina. No pneumothorax.  There is airspace consolidation throughout much the left lower lobe which was also present previously but appears somewhat more coalesced that this time. There is a small left pleural effusion. Right lung is clear. Heart size and pulmonary vascularity are normal. No adenopathy. No bone lesions. IMPRESSION: Airspace consolidation consistent with pneumonia left lower lobe with small left pleural effusion. Lungs elsewhere clear. Tracheostomy as described. No pneumothorax. Cardiac silhouette within normal limits. Electronically Signed   By: Lowella Grip III M.D.   On: 12/26/2016 09:00   Dg Abd 1 View  Result Date: 12/23/2016 CLINICAL DATA:  Feeding tube placement EXAM: ABDOMEN - 1 VIEW COMPARISON:  December 22, 2016 FINDINGS: Feeding tube tip is at the junction of the second and third portions of the duodenum. Bowel gas pattern is normal. No bowel obstruction. No free air. Moderate stool is present in the colon. IMPRESSION: Feeding tube tip at junction of second and third portions of duodenum. Bowel gas pattern normal. Electronically Signed   By: Lowella Grip III M.D.   On: 12/23/2016 13:45   Mr Jodene Nam Head Wo Contrast  Result Date: 12/09/2016 CLINICAL DATA:  Altered mental status, follow-up multiple embolic infarcts. Cerebral spinal fluid positive for HSV. History of HIV/aids. EXAM: MRA HEAD WITHOUT CONTRAST MRA NECK WITHOUT AND WITH CONTRAST TECHNIQUE: Angiographic images of the Circle of Willis were obtained using MRA technique without intravenous contrast. Angiographic images of the neck were obtained using MRA technique without and with intravenous contrast. Carotid stenosis measurements (when applicable) are obtained utilizing NASCET criteria, using the distal internal carotid diameter as the denominator. CONTRAST:  20 cc MultiHance COMPARISON:  MRI of the head December 04, 2016 FINDINGS: MRA HEAD FINDINGS ANTERIOR CIRCULATION: Thready irregular RIGHT cervical internal carotid artery through the level  of the carotid siphon. Robust LEFT internal carotid artery. Bilateral anterior cerebral arteries arise from LEFT A1-2 junction. Normal appearance of the anterior and middle cerebral arteries. No large vessel occlusion, high-grade stenosis, abnormal luminal irregularity, aneurysm. POSTERIOR CIRCULATION: LEFT vertebral artery is dominant. Basilar artery is patent, with normal flow related enhancement of the main branch vessels. Normal flow related enhancement of the posterior cerebral arteries. No large vessel occlusion, high-grade stenosis, abnormal luminal irregularity, aneurysm. ANATOMIC VARIANTS: None. MRA NECK FINDINGS- delayed phase, venous contamination. ANTERIOR CIRCULATION: The common carotid arteries are widely patent bilaterally. The carotid bifurcation is patent bilaterally and there is no hemodynamically significant carotid stenosis by NASCET criteria. Beaded appearance of the RIGHT cervical internal carotid artery to the intracranial segments. POSTERIOR CIRCULATION: Bilateral vertebral arteries are patent to the vertebrobasilar junction. No evidence for atherosclerosis or flow limiting stenosis. Bilateral posterior communicating arteries present. Source images and MIP image were reviewed. IMPRESSION: MRA HEAD: Irregular RIGHT internal carotid artery, vessel remains patent. No emergent large vessel occlusion or severe stenosis. No intracranial vasculopathy by MRA though catheter angiography is more sensitive. MRA NECK: Limited due to delayed phase. Beaded appearance of the RIGHT cervical internal carotid artery most compatible with fibromuscular dysplasia though focal HIV vasculopathy is possible. Electronically Signed   By: Sandie Ano  Bloomer M.D.   On: 12/09/2016 02:37   Mr Jodene Nam Neck W Contrast  Result Date: 12/09/2016 CLINICAL DATA:  Altered mental status, follow-up multiple embolic infarcts. Cerebral spinal fluid positive for HSV. History of HIV/aids. EXAM: MRA HEAD WITHOUT CONTRAST MRA NECK WITHOUT  AND WITH CONTRAST TECHNIQUE: Angiographic images of the Circle of Willis were obtained using MRA technique without intravenous contrast. Angiographic images of the neck were obtained using MRA technique without and with intravenous contrast. Carotid stenosis measurements (when applicable) are obtained utilizing NASCET criteria, using the distal internal carotid diameter as the denominator. CONTRAST:  20 cc MultiHance COMPARISON:  MRI of the head December 04, 2016 FINDINGS: MRA HEAD FINDINGS ANTERIOR CIRCULATION: Thready irregular RIGHT cervical internal carotid artery through the level of the carotid siphon. Robust LEFT internal carotid artery. Bilateral anterior cerebral arteries arise from LEFT A1-2 junction. Normal appearance of the anterior and middle cerebral arteries. No large vessel occlusion, high-grade stenosis, abnormal luminal irregularity, aneurysm. POSTERIOR CIRCULATION: LEFT vertebral artery is dominant. Basilar artery is patent, with normal flow related enhancement of the main branch vessels. Normal flow related enhancement of the posterior cerebral arteries. No large vessel occlusion, high-grade stenosis, abnormal luminal irregularity, aneurysm. ANATOMIC VARIANTS: None. MRA NECK FINDINGS- delayed phase, venous contamination. ANTERIOR CIRCULATION: The common carotid arteries are widely patent bilaterally. The carotid bifurcation is patent bilaterally and there is no hemodynamically significant carotid stenosis by NASCET criteria. Beaded appearance of the RIGHT cervical internal carotid artery to the intracranial segments. POSTERIOR CIRCULATION: Bilateral vertebral arteries are patent to the vertebrobasilar junction. No evidence for atherosclerosis or flow limiting stenosis. Bilateral posterior communicating arteries present. Source images and MIP image were reviewed. IMPRESSION: MRA HEAD: Irregular RIGHT internal carotid artery, vessel remains patent. No emergent large vessel occlusion or severe  stenosis. No intracranial vasculopathy by MRA though catheter angiography is more sensitive. MRA NECK: Limited due to delayed phase. Beaded appearance of the RIGHT cervical internal carotid artery most compatible with fibromuscular dysplasia though focal HIV vasculopathy is possible. Electronically Signed   By: Elon Alas M.D.   On: 12/09/2016 02:37   Mr Brain Wo Contrast  Result Date: 12/26/2016 CLINICAL DATA:  Altered mental status EXAM: MRI HEAD WITHOUT CONTRAST TECHNIQUE: Multiplanar, multiecho pulse sequences of the brain and surrounding structures were obtained without intravenous contrast. COMPARISON:  12/09/2016 FINDINGS: Brain: Resolved areas of restricted diffusion. On sagittal T1 weighted imaging there are small areas of T1 hyperintensity along the areas of previously described infarct along the sylvian fissures, likely cortical laminar necrosis. No blood products are noted. Mild FLAIR hyperintensity in the regions of fourth ventricular and left inferior cerebellar disease previously. No signs of acute or interval infarct. No swelling or evidence of sulcal/intraventricular debris. No hydrocephalus or mass. Vascular: Preserved flow voids Skull and upper cervical spine: Negative Sinuses/Orbits: Fluid levels in bilateral maxillary and sphenoid sinuses and in the left more than right mastoids. IMPRESSION: 1. No acute or interval finding. Acute findings on previous exam are resolved. 2. Expected evolution of small scattered infarcts seen previously. 3. Bilateral paranasal sinus and mastoid fluid levels. Electronically Signed   By: Monte Fantasia M.D.   On: 12/26/2016 08:32   Mr Cervical Spine W Wo Contrast  Result Date: 12/11/2016 CLINICAL DATA:  Paraplegia. Evaluate for abscess or post LP epidural hematoma. HIV and cocaine use. EXAM: MRI TOTAL SPINE WITHOUT AND WITH CONTRAST TECHNIQUE: Multisequence MR imaging of the spine from the cervical spine to the sacrum was performed prior to and  following  IV contrast administration for evaluation of spinal metastatic disease. CONTRAST:  52m MULTIHANCE GADOBENATE DIMEGLUMINE 529 MG/ML IV SOLN COMPARISON:  None. FINDINGS: MRI CERVICAL SPINE FINDINGS Alignment: Physiologic. Vertebrae: No fracture, evidence of discitis, or bone lesion. Hypointense marrow usually from anemia. Cord: The cord at has normal signal and morphology, but there is diffuse superficial enhancement in which also extends along the bilateral nerve roots. 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 There is also folia enhancement and superficial brainstem enhancement compatible with meningitis. Intubation with fluid throughout the paranasal sinuses and airway above the cuff. Disc levels: No herniation or impingement. MRI THORACIC SPINE FINDINGS Alignment:  Physiologic. Vertebrae: No fracture, evidence of discitis, or bone lesion. Hypointense marrow likely from anemia Cord: Predominately dorsal dural thickening throughout the thoracic canal. Diffuse cord signal abnormality with patchy appearance mainly along the posterior half of the peripheral cord. No notable swelling. Some of these areas enhance, including at in the T5 and T6 region. Direct infection or patchy infarct from perforators (expected the dorsal cord vessels would be greater involved in this patient who has been intubated and supine) Paraspinal and other soft tissues: Consolidation in both lower lobes. Disc levels: No herniation or impingement. MRI LUMBAR SPINE FINDINGS Segmentation:  Standard. Alignment:  Physiologic. Vertebrae: No fracture, evidence of discitis, or bone lesion. Hypointense marrow likely from anemia. Conus medullaris: Extends to the L1 level and appears normal. Paraspinal and other soft tissues: Negative Disc levels:  Mild disc desiccation and bulging at L3-4 and L4-5. Findings are most likely infectious, both by imaging and based on clinical circumstances. Neurosarcoid can have similar findings, but usually and a different clinical setting. There is no specific imaging feature to differentiate bacterial, viral, and helminth infection. IMPRESSION: 1. No abscess or hematoma as questioned clinically. 2. Generalized meningitis and radiculitis. 3. 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 4. Findings are nonspecific. In addition to the pathogens considered in the chart, would also check CMV. Neurosarcoid can have the same findings, but usually in a different clinical setting. Electronically Signed   By: JMonte FantasiaM.D.   On: 12/11/2016 13:49   Mr Thoracic Spine W Wo Contrast  Result Date: 12/11/2016 CLINICAL DATA:  Paraplegia. Evaluate for abscess or post LP epidural hematoma. HIV and cocaine use. EXAM: MRI TOTAL SPINE WITHOUT AND WITH CONTRAST TECHNIQUE: Multisequence MR imaging of the spine from the cervical spine to the sacrum was performed prior to and following IV contrast administration for evaluation of spinal metastatic disease. CONTRAST:  247mMULTIHANCE GADOBENATE DIMEGLUMINE 529 MG/ML IV SOLN COMPARISON:  None. FINDINGS: MRI CERVICAL SPINE FINDINGS Alignment: Physiologic. Vertebrae: No fracture, evidence of discitis, or bone lesion. Hypointense marrow usually from anemia. Cord: The cord at has normal signal and morphology, but there is diffuse superficial enhancement in which also extends along the bilateral nerve roots. 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 There is also folia enhancement and superficial brainstem enhancement compatible with meningitis. Intubation  with fluid throughout the paranasal sinuses and airway above the cuff. Disc levels: No herniation or impingement. MRI THORACIC SPINE FINDINGS Alignment:  Physiologic. Vertebrae: No fracture, evidence of discitis, or bone lesion. Hypointense marrow likely from  anemia Cord: Predominately dorsal dural thickening throughout the thoracic canal. Diffuse cord signal abnormality with patchy appearance mainly along the posterior half of the peripheral cord. No notable swelling. Some of these areas enhance, including at in the T5 and T6 region. Direct infection or patchy infarct from perforators (expected the dorsal cord vessels would be greater involved in this patient who has been intubated and supine) Paraspinal and other soft tissues: Consolidation in both lower lobes. Disc levels: No herniation or impingement. MRI LUMBAR SPINE FINDINGS Segmentation:  Standard. Alignment:  Physiologic. Vertebrae: No fracture, evidence of discitis, or bone lesion. Hypointense marrow likely from anemia. Conus medullaris: Extends to the L1 level and appears normal. Paraspinal and other soft tissues: Negative Disc levels: Mild disc desiccation and bulging at L3-4 and L4-5. Findings are most likely infectious, both by imaging and based on clinical circumstances. Neurosarcoid can have similar findings, but usually and a different clinical setting. There is no specific imaging feature to differentiate bacterial, viral, and helminth infection. IMPRESSION: 1. No abscess or hematoma as questioned clinically. 2. Generalized meningitis and radiculitis. 3. 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 4. Findings are nonspecific. In addition to the pathogens considered in the chart, would also check CMV. Neurosarcoid can have the same findings, but usually in a different clinical setting. Electronically Signed   By: Monte Fantasia M.D.   On: 12/11/2016 13:49   Mr Lumbar Spine W Wo Contrast  Result Date:  12/11/2016 CLINICAL DATA:  Paraplegia. Evaluate for abscess or post LP epidural hematoma. HIV and cocaine use. EXAM: MRI TOTAL SPINE WITHOUT AND WITH CONTRAST TECHNIQUE: Multisequence MR imaging of the spine from the cervical spine to the sacrum was performed prior to and following IV contrast administration for evaluation of spinal metastatic disease. CONTRAST:  79m MULTIHANCE GADOBENATE DIMEGLUMINE 529 MG/ML IV SOLN COMPARISON:  None. FINDINGS: MRI CERVICAL SPINE FINDINGS Alignment: Physiologic. Vertebrae: No fracture, evidence of discitis, or bone lesion. Hypointense marrow usually from anemia. Cord: The cord at has normal signal and morphology, but there is diffuse superficial enhancement in which also extends along the bilateral nerve roots. 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 There is also folia enhancement and superficial brainstem enhancement compatible with meningitis. Intubation with fluid throughout the paranasal sinuses and airway above the cuff. Disc levels: No herniation or impingement. MRI THORACIC SPINE FINDINGS Alignment:  Physiologic. Vertebrae: No fracture, evidence of discitis, or bone lesion. Hypointense marrow likely from anemia Cord: Predominately dorsal dural thickening throughout the thoracic canal. Diffuse cord signal abnormality with patchy appearance mainly along the posterior half of the peripheral cord. No notable swelling. Some of these areas enhance, including at in the T5 and T6 region. Direct infection or patchy infarct from perforators (expected the dorsal cord vessels would be greater involved in this patient who has been intubated and supine) Paraspinal and other soft tissues: Consolidation in both lower lobes. Disc levels: No herniation or impingement. MRI LUMBAR SPINE  FINDINGS Segmentation:  Standard. Alignment:  Physiologic. Vertebrae: No fracture, evidence of discitis, or bone lesion. Hypointense marrow likely from anemia. Conus medullaris: Extends to the L1 level and appears normal. Paraspinal and other soft tissues: Negative Disc levels: Mild disc desiccation and bulging at L3-4 and L4-5. Findings are most likely infectious, both by imaging and based on clinical circumstances. Neurosarcoid can have  similar findings, but usually and a different clinical setting. There is no specific imaging feature to differentiate bacterial, viral, and helminth infection. IMPRESSION: 1. No abscess or hematoma as questioned clinically. 2. Generalized meningitis and radiculitis. 3. 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 4. Findings are nonspecific. In addition to the pathogens considered in the chart, would also check CMV. Neurosarcoid can have the same findings, but usually in a different clinical setting. Electronically Signed   By: Monte Fantasia M.D.   On: 12/11/2016 13:49   Dg Chest Port 1 View  Result Date: 12/23/2016 CLINICAL DATA:  51 year old male with history of cough. EXAM: PORTABLE CHEST 1 VIEW COMPARISON:  Chest x-ray 12/19/2016. FINDINGS: Worsening patchy multifocal airspace disease in the left mid to lower lung and at the right lung base, concerning for progressive multilobar bronchopneumonia. Probable small left pleural effusion appears slightly increased compared to the prior study. No definite right pleural effusion. No evidence of pulmonary edema. Heart size appears borderline to mildly enlarged. Upper mediastinal contours are within normal limits. Feeding tube extends into the stomach, but the tip is below the lower margin of the images. Previously noted nasogastric tube has been removed. Tracheostomy tube in place with tip terminating 5.8 cm above the carina. IMPRESSION: 1. Worsening bilateral lower lobe multilobar  bronchopneumonia (left greater than right) with small left parapneumonic pleural effusion. 2. Support apparatus, as above. Electronically Signed   By: Vinnie Langton M.D.   On: 12/23/2016 13:20   Dg Chest Port 1 View  Result Date: 12/19/2016 CLINICAL DATA:  Respiratory distress EXAM: PORTABLE CHEST 1 VIEW COMPARISON:  12/16/2016 FINDINGS: Support devices are unchanged. Heart is borderline enlarged. Left lower lobe atelectasis or infiltrate with small left effusion. This is similar to prior study. No focal opacity on the right. IMPRESSION: Stable left lower lobe atelectasis or infiltrate with small left effusion. Electronically Signed   By: Rolm Baptise M.D.   On: 12/19/2016 07:32   Dg Chest Port 1 View  Result Date: 12/16/2016 CLINICAL DATA:  Acute respiratory failure EXAM: PORTABLE CHEST 1 VIEW COMPARISON:  12/15/2016 FINDINGS: Cardiac shadow is stable. Feeding catheter and tracheostomy tube are again seen. The left jugular central line is not well appreciated on this exam. Persistent left retrocardiac density is noted. No focal infiltrate is seen on the right. No pneumothorax is seen. IMPRESSION: Persistent left basilar infiltrate. Electronically Signed   By: Inez Catalina M.D.   On: 12/16/2016 07:54   Dg Chest Port 1 View  Result Date: 12/15/2016 CLINICAL DATA:  Pulmonary edema. EXAM: PORTABLE CHEST 1 VIEW COMPARISON:  12/13/2016 and 12/12/2016 FINDINGS: Tracheostomy tube is in place. Left jugular vein catheter is unchanged with the tip at the junction of the jugular vein and left innominate vein. Pulmonary vascular congestion has improved. The infiltrate and effusion at the left lung base process. New slight atelectasis at the right lung base. IMPRESSION: 1. A persistent infiltrate and effusion at the left lung base, stable. 2. New slight atelectasis at the right lung base. 3. Improved pulmonary vascular prominence. Electronically Signed   By: Lorriane Shire M.D.   On: 12/15/2016 07:34   Dg Chest  Port 1 View  Result Date: 12/13/2016 CLINICAL DATA:  Respiratory failure EXAM: PORTABLE CHEST 1 VIEW COMPARISON:  12/12/2016 FINDINGS: Tracheostomy tube is noted. Left jugular central line is again seen and stable. The nasogastric catheter has been removed. Left retrocardiac density is noted consistent with atelectasis/infiltrate. Small left pleural effusion is noted.  Mild vascular congestion is noted. IMPRESSION: Stable left retrocardiac infiltrate and small effusion. Mild vascular congestion is seen. Electronically Signed   By: Inez Catalina M.D.   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 Respiratory failure. Initial encounter. EXAM: PORTABLE CHEST 1 VIEW COMPARISON:  12/09/2016 and earlier. FINDINGS: Portable AP semi upright view at 0443 hours. Endotracheal tube tip between the level the clavicles and carina. Enteric tube courses to the abdomen and side hole projects at the level of the gastric body. Stable left IJ central line, tip projects at the subclavian and jugular venous confluence. Stable lung volumes. Stable cardiac size and mediastinal contours. Continued dense retrocardiac opacity obscuring the left hemidiaphragm. Mild superimposed bilateral perihilar reticulonodular opacity. No pneumothorax or pulmonary edema. Ventilation has not significantly changed since 12/08/2016. IMPRESSION: 1. Stable lines and tubes. Note that the left IJ central line tip is at the confluence of the left subclavian and jugular veins. 2. Since 12/08/2016 there is Left lower lobe collapse or consolidation with superimposed bilateral perihilar reticulonodular opacity. Favor bilateral pneumonia. Electronically Signed   By: Genevie Ann M.D.   On: 12/12/2016 07:05   Dg Chest Port 1 View  Result Date: 12/09/2016 CLINICAL DATA:  Intubation.  Shortness of breath. EXAM: PORTABLE CHEST 1 VIEW COMPARISON:  12/08/2016. FINDINGS:  Endotracheal tube and NG tube in stable position. Stable cardiomegaly. Bilateral pulmonary infiltrates/edema are again noted, slightly progressed from prior exam. Small left pleural effusion noted on today's exam. No pneumothorax . IMPRESSION: 1. Lines and tubes in stable position. 2. Stable cardiomegaly. 3. Bilateral pulmonary infiltrates/edema, slightly progressed from prior exam. Small left pleural effusion noted on today's exam . Electronically Signed   By: Marcello Moores  Register   On: 12/09/2016 07:18   Dg Chest Port 1 View  Result Date: 12/08/2016 CLINICAL DATA:  Intubation. EXAM: PORTABLE CHEST 1 VIEW COMPARISON:  12/07/2016. FINDINGS: Interim advancement of NG tube below left hemidiaphragm. Endotracheal tube, left IJ line in stable position. Cardiomegaly. Bibasilar pulmonary infiltrates. No pleural effusion or pneumothorax. IMPRESSION: 1. Interim advancement of NG tube below left hemidiaphragm. Endotracheal tube and left IJ line stable position. 2.  Bibasilar atelectasis and infiltrates again noted.  No change. Electronically Signed   By: Marcello Moores  Register   On: 12/08/2016 06:48   Dg Abd Portable 1v  Result Date: 12/23/2016 CLINICAL DATA:  Enteric tube placement EXAM: PORTABLE ABDOMEN - 1 VIEW COMPARISON:  Abdominal radiograph from earlier today FINDINGS: Weighted enteric tube terminates in the right upper quadrant of the abdomen in the region of the pylorus. No dilated small bowel loops. No evidence of pneumatosis or pneumoperitoneum. Patchy left lung base opacity. IMPRESSION: Weighted enteric tube terminates in the right upper quadrant in the region of the pylorus. Nonobstructive bowel gas pattern. Patchy left lung base opacity. Electronically Signed   By: Ilona Sorrel M.D.   On: 12/23/2016 17:19   Dg Abd Portable 1v  Result Date: 12/22/2016 CLINICAL DATA:  Assess feeding tube position. EXAM: PORTABLE ABDOMEN - 1 VIEW COMPARISON:  KUB of December 20, 2016 FINDINGS: The radiodense tip of the feeding  tube lies in the region of the pylorus or first portion of the duodenum. There is some looping of the feeding tube in the gastric cardia. The bowel gas pattern is within the limits of normal. The bony structures exhibit no acute abnormalities. IMPRESSION: There has been distal migration of the feeding  tube since at the tip lies in the region of the pylorus or first portion of the duodenum. Electronically Signed   By: David  Martinique M.D.   On: 12/22/2016 15:31   Dg Abd Portable 1v  Result Date: 12/20/2016 CLINICAL DATA:  Ileus EXAM: PORTABLE ABDOMEN - 1 VIEW COMPARISON:  To 09/2017 FINDINGS: Feeding tube tip in the duodenum near the ligament of Treitz, unchanged. Small bowel dilatation has progressed. Mild small bowel distention. Gas in the colon without dilatation. NG tube in the stomach. IMPRESSION: Mildly dilated small bowel loops with progression since the prior study. Electronically Signed   By: Franchot Gallo M.D.   On: 12/20/2016 06:41   Dg Abd Portable 1v  Result Date: 12/19/2016 CLINICAL DATA:  Ileus EXAM: PORTABLE ABDOMEN - 1 VIEW COMPARISON:  12/18/2016 FINDINGS: NG tube is in the stomach. Feeding tube tip in the distal duodenum. Nonobstructive bowel gas pattern. Gas throughout nondistended large and small bowel. No free air organomegaly. IMPRESSION: Improved bowel distention.  Nonobstructive pattern currently. Feeding tube tip remains in the distal duodenum. Electronically Signed   By: Rolm Baptise M.D.   On: 12/19/2016 09:12   Dg Abd Portable 1v  Result Date: 12/18/2016 CLINICAL DATA:  Ileus. EXAM: PORTABLE ABDOMEN - 1 VIEW COMPARISON:  12/17/2016. FINDINGS: Improved gaseous distention of the abdomen. Support tubes and apparatus appears stable. IMPRESSION: Improved gaseous distention. Electronically Signed   By: Staci Righter M.D.   On: 12/18/2016 08:26   Dg Abd Portable 1v  Result Date: 12/17/2016 CLINICAL DATA:  Followup abdominal distension. EXAM: PORTABLE ABDOMEN - 1 VIEW FINDINGS:  There is an NG tube with its tip in the antropyloric region of the stomach. There is also a feeding tube with its tip in the fourth portion of the duodenum. The lung bases are grossly clear. Persistent air distended small bowel and colon suggesting a diffuse ileus. No free air. IMPRESSION: Persistent diffuse ileus bowel gas pattern. NG and feeding tubes in good position. Electronically Signed   By: Marijo Sanes M.D.   On: 12/17/2016 09:39   Dg Abd Portable 1v  Result Date: 12/16/2016 CLINICAL DATA:  Assess nasogastric tube positioning. EXAM: PORTABLE ABDOMEN - 1 VIEW COMPARISON:  KUB of December 16, 2016 at 9:22 a.m. FINDINGS: The radiodense tipped feeding tube tip projects below the inferior margin of the image. The orogastric tube has its proximal port in the gastric cardia with the tip re-curved upon itself pointing toward the GE junction. There are loops of moderately distended gas-filled small and large bowel present. There is dense atelectasis or pneumonia in the left lower lobe. IMPRESSION: The tip of the feeding tube projects below the inferior margin of the image. Both the proximal port and the tip of the orogastric tube lie in the region of the gastric cardia but the tip of the tube is directed back toward the GE junction. Electronically Signed   By: David  Martinique M.D.   On: 12/16/2016 15:58   Dg Abd Portable 1v  Result Date: 12/16/2016 CLINICAL DATA:  Abdominal distention EXAM: PORTABLE ABDOMEN - 1 VIEW COMPARISON:  December 14, 2016 FINDINGS: Feeding tube tip is in the third portion of the duodenum, unchanged. There are multiple loops of dilated bowel without bowel wall thickening. No free air evident. IMPRESSION: Bowel gas pattern is concerning for ileus or possibly early bowel obstruction. No demonstrable free air. Feeding tube tip in third portion duodenum. Electronically Signed   By: Lowella Grip III M.D.   On: 12/16/2016  09:37   Dg Abd Portable 1v  Result Date: 12/14/2016 CLINICAL DATA:   Nasogastric tube placement. EXAM: PORTABLE ABDOMEN - 1 VIEW COMPARISON:  12/07/2016 FINDINGS: Nasogastric tube enters the stomach in has its tip at the proximal fourth portion of the duodenum. Upper abdominal bowel gas pattern is unremarkable. Atelectasis persists at the left base. IMPRESSION: Nasogastric tube tip at the proximal fourth portion of the duodenum. Electronically Signed   By: Nelson Chimes M.D.   On: 12/14/2016 16:26   Dg Swallowing Func-speech Pathology  Result Date: 12/30/2016 Objective Swallowing Evaluation: Type of Study: MBS-Modified Barium Swallow Study Patient Details Name: KHIAN REMO MRN: [id number] Date of Birth: 06-18-1966 Today's Date: 12/30/2016 Time: SLP Start Time (ACUTE ONLY): 1306-SLP Stop Time (ACUTE ONLY): 1331 SLP Time Calculation (min) (ACUTE ONLY): 25 min Past Medical History: Past Medical History: Diagnosis Date . COPD (chronic obstructive pulmonary disease) ([place])  . Genital warts  . HIV (human immunodeficiency virus infection) ([place])  . Osteoporosis  Past Surgical History: Past Surgical History: Procedure Laterality Date . PROSTATE SURGERY  11/17/2016 HPI: 51 year old man with HIV/AIDS (first diagnosed 2003, COPD, and cocaine substance abuse, recent surgery in [place] (?) who presented to [facility] on 1/24 with complaint of altered mental status. Found to be hypertensive to 240/112. 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 MBS 2/19 recommended Dys 1, pudding thick liquids.  No Data Recorded Assessment / Plan / Recommendation CHL IP CLINICAL IMPRESSIONS 12/30/2016 Clinical Impression Swallow function mildy decreased from prior 2/19 with increased motor impairments observed. 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 Mr. Azizi exhibited a mod-severe pharyngeal dysphagia characterized by both silent aspiration and  penetration with nectar and honey thick liquids, which was present with several trials. Verbal cues to cough, throat clear, and second swallows did not effectively remove the aspirated/penetrated barium or residue. Delayed swallow initiation to valleculae and pyriform sinuses noted throughout the study due to decreased sensation. Significantly increased vallecular and pyriform sinus residue compared to prior MBS (12/27/16) due to reduced tongue base retraction and laryngeal elevation; impaired cervical esophageal relaxation resulted in barium stasis above and below the UES and slow transit (not observed during 2/19 MBS). Educated pt re: severity of swallow impairments and the high aspiration risk associated primarily with consistencies thinner than pudding thick. Pt was adamant about consuming a regular diet and thin liquids despite SLP education. Recommend Dys 1 (pureed solids), pudding thick liquids via spoon, and meds crushed. MD notified who stated she will speak with pt and pt's mother. ST will f/u. SLP Visit Diagnosis Dysphagia, pharyngeal phase (R13.13);Dysphagia, pharyngoesophageal phase (R13.14) Attention and concentration deficit following -- Frontal lobe and executive function deficit following -- Impact on safety and function Severe aspiration risk   CHL IP TREATMENT RECOMMENDATION 12/30/2016 Treatment Recommendations Therapy as outlined in treatment plan below   Prognosis 12/30/2016 Prognosis for Safe Diet Advancement Fair Barriers to Reach Goals Severity of deficits Barriers/Prognosis Comment -- CHL IP DIET RECOMMENDATION 12/30/2016 SLP Diet Recommendations Pudding thick liquid;Dysphagia 1 (Puree) solids Liquid Administration via Spoon;No straw Medication Administration Crushed with puree Compensations Slow rate;Small sips/bites Postural Changes Seated upright at 90 degrees   CHL IP OTHER RECOMMENDATIONS 12/30/2016 Recommended Consults -- Oral Care Recommendations Oral care BID Other Recommendations Order  thickener from pharmacy   CHL IP FOLLOW UP RECOMMENDATIONS 12/30/2016 Follow up Recommendations Other (comment)   CHL  IP FREQUENCY AND DURATION 12/30/2016 Speech Therapy Frequency (ACUTE ONLY) min 2x/week Treatment Duration 2 weeks      CHL IP ORAL PHASE 12/30/2016 Oral Phase WFL Oral - Pudding Teaspoon -- Oral - Pudding Cup -- Oral - Honey Teaspoon -- Oral - Honey Cup WFL Oral - Nectar Teaspoon -- Oral - Nectar Cup WFL Oral - Nectar Straw -- Oral - Thin Teaspoon -- Oral - Thin Cup -- Oral - Thin Straw -- Oral - Puree WFL Oral - Mech Soft -- Oral - Regular -- Oral - Multi-Consistency -- Oral - Pill -- Oral Phase - Comment --  CHL IP PHARYNGEAL PHASE 12/30/2016 Pharyngeal Phase Impaired Pharyngeal- Pudding Teaspoon -- Pharyngeal -- Pharyngeal- Pudding Cup -- Pharyngeal -- Pharyngeal- Honey Teaspoon NT Pharyngeal -- Pharyngeal- Honey Cup Delayed swallow initiation-vallecula;Delayed swallow initiation-pyriform sinuses;Penetration/Aspiration during swallow;Pharyngeal residue - valleculae;Pharyngeal residue - pyriform;Pharyngeal residue - cp segment;Reduced tongue base retraction;Reduced laryngeal elevation Pharyngeal Material enters airway, remains ABOVE vocal cords then ejected out;Material enters airway, passes BELOW cords without attempt by patient to eject out (silent aspiration) Pharyngeal- Nectar Teaspoon NT Pharyngeal -- Pharyngeal- Nectar Cup Delayed swallow initiation-vallecula;Delayed swallow initiation-pyriform sinuses;Penetration/Aspiration during swallow;Pharyngeal residue - valleculae;Pharyngeal residue - pyriform;Pharyngeal residue - cp segment;Reduced tongue base retraction Pharyngeal Material enters airway, passes BELOW cords without attempt by patient to eject out (silent aspiration) Pharyngeal- Nectar Straw -- Pharyngeal -- Pharyngeal- Thin Teaspoon -- Pharyngeal -- Pharyngeal- Thin Cup -- Pharyngeal -- Pharyngeal- Thin Straw -- Pharyngeal -- Pharyngeal- Puree Pharyngeal residue - valleculae;Pharyngeal  residue - pyriform;Reduced tongue base retraction;Reduced laryngeal elevation Pharyngeal -- Pharyngeal- Mechanical Soft -- Pharyngeal -- Pharyngeal- Regular -- Pharyngeal -- Pharyngeal- Multi-consistency -- Pharyngeal -- Pharyngeal- Pill -- Pharyngeal -- Pharyngeal Comment --  CHL IP CERVICAL ESOPHAGEAL PHASE 12/30/2016 Cervical Esophageal Phase Impaired Pudding Teaspoon -- Pudding Cup -- Honey Teaspoon -- Honey Cup Reduced cricopharyngeal relaxation Nectar Teaspoon -- Nectar Cup Reduced cricopharyngeal relaxation Nectar Straw -- Thin Teaspoon -- Thin Cup -- Thin Straw -- Puree Reduced cricopharyngeal relaxation Mechanical Soft -- Regular -- Multi-consistency -- Pill -- Cervical Esophageal Comment -- No flowsheet data found. [place] Siren 12/30/2016, 3:15 PM  Orbie Pyo Colvin Caroli.Ed CCC-SLP Pager [phone] [phone]             Dg Swallowing Func-speech Pathology  Result Date: 12/27/2016 Objective Swallowing Evaluation: Type of Study: MBS-Modified Barium Swallow Study Patient Details Name: MONTARIUS KITAGAWA MRN: [id number] Date of Birth: February 25, 1966 Today's Date: 12/27/2016 Time: SLP Start Time (ACUTE ONLY): 0930-SLP Stop Time (ACUTE ONLY): 0950 SLP Time Calculation (min) (ACUTE ONLY): 20 min Past Medical History: Past Medical History: Diagnosis Date . COPD (chronic obstructive pulmonary disease) ([place])  . Genital warts  . HIV (human immunodeficiency virus infection) ([place])  . Osteoporosis  Past Surgical History: Past Surgical History: Procedure Laterality Date . PROSTATE SURGERY  11/17/2016 HPI: 51 year old man with HIV/AIDS (first diagnosed 2003, COPD, and cocaine substance abuse, recent surgery in [place] (?) who presented to [facility] on 1/24 with complaint of altered mental status. Found to be hypertensive to 240/112. 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 No  Data Recorded Assessment / Plan / Recommendation CHL IP CLINICAL IMPRESSIONS 12/27/2016 Clinical Impression MBS completed wearing Passy-Muir speaking valve. Pharyngeal impairments primarily originate from decreased sensation with minimal motor deficits. Valleculae was site of swallow initiation for most trials. Pyriform sinsus appear shallow and small with min-mild residue silently aspirated  after the swallow. Larygneal penetration consistent during the swallow with honey thick barium without awareness. Recommend Dys 1 (puree) only and pudding thick liquids (nothing thinner than puree/pudding), donn speaking valve with all meals/meds, slow rate, small bites.   SLP Visit Diagnosis Dysphagia, pharyngeal phase (R13.13) Attention and concentration deficit following -- Frontal lobe and executive function deficit following -- Impact on safety and function Moderate aspiration risk   CHL IP TREATMENT RECOMMENDATION 12/27/2016 Treatment Recommendations Therapy as outlined in treatment plan below   Prognosis 12/27/2016 Prognosis for Safe Diet Advancement Good Barriers to Reach Goals -- Barriers/Prognosis Comment -- CHL IP DIET RECOMMENDATION 12/27/2016 SLP Diet Recommendations Dysphagia 1 (Puree) solids;Pudding thick liquid Liquid Administration via Spoon Medication Administration Crushed with puree Compensations Slow rate;Small sips/bites Postural Changes Seated upright at 90 degrees   CHL IP OTHER RECOMMENDATIONS 12/27/2016 Recommended Consults -- Oral Care Recommendations Oral care BID Other Recommendations Order thickener from pharmacy   CHL IP FOLLOW UP RECOMMENDATIONS 12/27/2016 Follow up Recommendations (No Data)   CHL IP FREQUENCY AND DURATION 12/27/2016 Speech Therapy Frequency (ACUTE ONLY) min 2x/week Treatment Duration 2 weeks      CHL IP ORAL PHASE 12/27/2016 Oral Phase WFL Oral - Pudding Teaspoon -- Oral - Pudding Cup -- Oral - Honey Teaspoon -- Oral - Honey Cup -- Oral - Nectar Teaspoon -- Oral - Nectar Cup -- Oral -  Nectar Straw -- Oral - Thin Teaspoon -- Oral - Thin Cup -- Oral - Thin Straw -- Oral - Puree -- Oral - Mech Soft -- Oral - Regular -- Oral - Multi-Consistency -- Oral - Pill -- Oral Phase - Comment --  CHL IP PHARYNGEAL PHASE 12/27/2016 Pharyngeal Phase Impaired Pharyngeal- Pudding Teaspoon -- Pharyngeal -- Pharyngeal- Pudding Cup -- Pharyngeal -- Pharyngeal- Honey Teaspoon Delayed swallow initiation-vallecula;Pharyngeal residue - valleculae;Pharyngeal residue - pyriform;Reduced tongue base retraction;Reduced laryngeal elevation;Penetration/Apiration after swallow Pharyngeal Material enters airway, passes BELOW cords without attempt by patient to eject out (silent aspiration);Material enters airway, remains ABOVE vocal cords and not ejected out Pharyngeal- Honey Cup -- Pharyngeal -- Pharyngeal- Nectar Teaspoon Delayed swallow initiation-vallecula;Penetration/Aspiration during swallow;Pharyngeal residue - valleculae;Pharyngeal residue - pyriform;Reduced tongue base retraction;Reduced laryngeal elevation Pharyngeal Material enters airway, passes BELOW cords without attempt by patient to eject out (silent aspiration) Pharyngeal- Nectar Cup -- Pharyngeal -- Pharyngeal- Nectar Straw -- Pharyngeal -- Pharyngeal- Thin Teaspoon -- Pharyngeal -- Pharyngeal- Thin Cup -- Pharyngeal -- Pharyngeal- Thin Straw -- Pharyngeal -- Pharyngeal- Puree Delayed swallow initiation-vallecula Pharyngeal -- Pharyngeal- Mechanical Soft -- Pharyngeal -- Pharyngeal- Regular -- Pharyngeal -- Pharyngeal- Multi-consistency -- Pharyngeal -- Pharyngeal- Pill -- Pharyngeal -- Pharyngeal Comment --  CHL IP CERVICAL ESOPHAGEAL PHASE 12/27/2016 Cervical Esophageal Phase WFL Pudding Teaspoon -- Pudding Cup -- Honey Teaspoon -- Honey Cup -- Nectar Teaspoon -- Nectar Cup -- Nectar Straw -- Thin Teaspoon -- Thin Cup -- Thin Straw -- Puree -- Mechanical Soft -- Regular -- Multi-consistency -- Pill -- Cervical Esophageal Comment -- No flowsheet data found.  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  Recommendation for follow-up MRI of spine in 2-3 weeks.  He is here for rehabilitation strengthening-again his mother hopes that he can come up to [place]-and be  placed under hospice care although again  this has not been finalized   #2 acute hypoxic respiratory failure status post tracheostomy which is been since been removed he was treated for ESBL-Klebsiella healthcare associated pneumonia sepsis-she's completed antibiotics at one point was on a ventilator currently is breathing well on room air. He continues on when necessary nebulizers  #3-history of HIV infection with hepatitis C virus antibody positive and chronic hepatitis B infection-currently continues his antiviral medications he will need followed by infectious disease.  #4 oropharyngeal dysphagia-he is on a dysphagia diet-he will be evaluated by speech therapy here.  He really wants regular diet but again is an aspiration risk and this was discussed in the hospital.  #5 history of altered mental status this apparently has improved with treatment for his infections-he continues to speak in a whisper and at times is somewhat difficult to fully understand-he is refusing his insulin however blood sugars appear to be stable without the insulin will discontinue his Lantus and monitor.  #6 history of ileus apparently this improved nursing reports he's having regular bowel movements-he is complaining of some chronic abdominal discomfort however will obtain an abdominal x-ray also will obtain a CBC with differential and CMP tomorrow morning if patient will allow-apparently he refused labs this morning I did discuss this with him --says he will allow   Although  although this may change--  #7 history of scrotal genital lesions-evaluation by wound care this is largely resolved with residual warts  I do note he does continue on Diflucan once a day.  #8 pain management-patient continues complaining of pain although I suspect there is some element of medication seeking behavior here  when I entered the room he appeared toe comfortable and this is nursing's impression as well but this will have to be watched he is on significant pain management including a Duragesic patch 25 g as well as ox Oxy IR 5 mg every 4 hours for breakthrough pain he is also on Neurontin 600 mg 3 times a day-  #   #9-history of anxiety he continues on Klonopin 3 times a day at this point will monitor  Of note Will try to obtain blood work for a CBC with differential and CMP-also obtain abdominal x-ray.  He will need infectious disease follow-up-we'll discontinue his Lantus and monitor blood sugars.  Also will need reimaging MRI of the spine in approximately 2-3 weeks.  [id number] note greater than 1 hour spent assessing patient-discussing his status with nursing staff-reviewing his chart-reviewing his labs-and coordinating and formulating a plan of care for numerous diagnoses-of note greater than 50% of time spent coordinating care

## 2017-01-07 ENCOUNTER — Encounter (HOSPITAL_COMMUNITY)
Admission: AD | Admit: 2017-01-07 | Discharge: 2017-01-07 | Disposition: A | Discharge status: Home / Self Care | Payer: Medicaid Other | Source: Skilled Nursing Facility | Attending: Internal Medicine | Admitting: Internal Medicine

## 2017-01-07 ENCOUNTER — Ambulatory Visit (HOSPITAL_COMMUNITY): Payer: Medicaid Other | Attending: Internal Medicine

## 2017-01-07 DIAGNOSIS — J189 Pneumonia, unspecified organism: Secondary | ICD-10-CM | POA: Insufficient documentation

## 2017-01-07 DIAGNOSIS — Z8701 Personal history of pneumonia (recurrent): Secondary | ICD-10-CM | POA: Insufficient documentation

## 2017-01-07 LAB — CBC WITH DIFFERENTIAL/PLATELET
BASOS ABS: 0 10*3/uL (ref 0.0–0.1)
Basophils Relative: 0 %
Eosinophils Absolute: 0.3 10*3/uL (ref 0.0–0.7)
Eosinophils Relative: 3 %
HEMATOCRIT: 34.9 % — AB (ref 39.0–52.0)
HEMOGLOBIN: 11.5 g/dL — AB (ref 13.0–17.0)
LYMPHS PCT: 14 %
Lymphs Abs: 1.2 10*3/uL (ref 0.7–4.0)
MCH: 30.9 pg (ref 26.0–34.0)
MCHC: 33 g/dL (ref 30.0–36.0)
MCV: 93.8 fL (ref 78.0–100.0)
Monocytes Absolute: 0.8 10*3/uL (ref 0.1–1.0)
Monocytes Relative: 9 %
NEUTROS ABS: 6.4 10*3/uL (ref 1.7–7.7)
NEUTROS PCT: 74 %
PLATELETS: 203 10*3/uL (ref 150–400)
RBC: 3.72 MIL/uL — AB (ref 4.22–5.81)
RDW: 16.9 % — ABNORMAL HIGH (ref 11.5–15.5)
WBC: 8.7 10*3/uL (ref 4.0–10.5)

## 2017-01-07 LAB — COMPREHENSIVE METABOLIC PANEL
ALT: 53 U/L (ref 17–63)
AST: 34 U/L (ref 15–41)
Albumin: 2.4 g/dL — ABNORMAL LOW (ref 3.5–5.0)
Alkaline Phosphatase: 61 U/L (ref 38–126)
Anion gap: 8 (ref 5–15)
BILIRUBIN TOTAL: 0.5 mg/dL (ref 0.3–1.2)
BUN: 23 mg/dL — AB (ref 6–20)
CHLORIDE: 95 mmol/L — AB (ref 101–111)
CO2: 30 mmol/L (ref 22–32)
CREATININE: 1.31 mg/dL — AB (ref 0.61–1.24)
Calcium: 8.7 mg/dL — ABNORMAL LOW (ref 8.9–10.3)
GFR calc Af Amer: >60 mL/min (ref >60)
Glucose, Bld: 140 mg/dL — ABNORMAL HIGH (ref 65–99)
Potassium: 4.6 mmol/L (ref 3.5–5.1)
Sodium: 133 mmol/L — ABNORMAL LOW (ref 135–145)
Total Protein: 7.2 g/dL (ref 6.5–8.1)

## 2017-01-09 ENCOUNTER — Encounter (HOSPITAL_COMMUNITY)
Admission: RE | Admit: 2017-01-09 | Discharge: 2017-01-09 | Disposition: A | Discharge status: Home / Self Care | Payer: Medicaid Other

## 2017-01-09 ENCOUNTER — Non-Acute Institutional Stay (SKILLED_NURSING_FACILITY): Payer: Medicaid Other | Admitting: Internal Medicine

## 2017-01-09 DIAGNOSIS — Z794 Long term (current) use of insulin: Secondary | ICD-10-CM | POA: Diagnosis not present

## 2017-01-09 DIAGNOSIS — B20 Human immunodeficiency virus [HIV] disease: Secondary | ICD-10-CM | POA: Diagnosis not present

## 2017-01-09 DIAGNOSIS — R339 Retention of urine, unspecified: Secondary | ICD-10-CM | POA: Diagnosis not present

## 2017-01-09 DIAGNOSIS — G373 Acute transverse myelitis in demyelinating disease of central nervous system: Secondary | ICD-10-CM | POA: Diagnosis not present

## 2017-01-09 DIAGNOSIS — Z21 Asymptomatic human immunodeficiency virus [HIV] infection status: Secondary | ICD-10-CM

## 2017-01-09 DIAGNOSIS — E119 Type 2 diabetes mellitus without complications: Secondary | ICD-10-CM | POA: Diagnosis not present

## 2017-01-09 LAB — URINALYSIS, ROUTINE W REFLEX MICROSCOPIC
Bilirubin Urine: NEGATIVE
Glucose, UA: NEGATIVE mg/dL
Ketones, ur: NEGATIVE mg/dL
Nitrite: NEGATIVE
Protein, ur: 100 mg/dL — AB
SPECIFIC GRAVITY, URINE: 1.011 (ref 1.005–1.030)
pH: 7 (ref 5.0–8.0)

## 2017-01-09 NOTE — Progress Notes (Signed)
01/09/17  Facility; [facility] Chief complaint admission to the facility post stay at [facility] from 12/01/16 through 01/04/17;  History; this is a 51 year old man who has a history of HIV, substance abuse, medical noncompliance. [facility] and [facility] mental status due to bacterial meningitis. He was intubated and started on broad-spectrum antibiotics as well as acyclovir for scrotal lesions. He will underwent a lumbar punctures that showed a white count high with predominance of eosinophils question parasitic infection. Cytology negative for malignant cells or parasites. MRI was done that showed small infarcts in the cerebellum and cortex due to vasospasm or embolic disease. 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 MRA suggested fibromuscular dysplasia. His infarcts were felt to be secondary to vasculitis and vasospasm secondary to meningoencephalitis. MRI of total spine was negative for abscess hematoma. 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 He was treated for a complicated UTI ESBL Klebsiella and a tracheostomy placed on 2/5 although that since been removed.. The patient completed a 21 day course of acyclovir. Now he is on Bactrim for PCP prophylaxis.  Apparently in the facility the patient is complaining bitterly about his diet currently a pured diet and pudding thick liquids which she refuses. He was on insulin in the hospital with a diagnosis of type 2 diabetes although the patient apparently has been refusing his insulin here.  His tracheostomy has been removed. His Foley catheter was also removed before he came here. He had a prolonged I'll he is but is on its dysphagia diet.  Past Medical History:  Diagnosis Date  . COPD (chronic obstructive pulmonary disease) ([place])   . Genital warts   . HIV (human immunodeficiency virus infection) ([place])   . Osteoporosis      Past Surgical History:  Procedure Laterality Date  . PROSTATE SURGERY  11/17/2016      Current Facility-Administered Medications on File Prior to Visit  Medication Dose Route Frequency Provider Last Rate Last Dose  . chlorhexidine gluconate (MEDLINE KIT) (PERIDEX) 0.12 % solution 15 mL  15 mL Mouth Rinse BID [place], MD      . MEDLINE mouth rinse  15 mL Mouth Rinse 10 times per day [place], MD       Current Outpatient Prescriptions on File Prior to Visit  Medication Sig Dispense Refill  . aspirin 325 MG tablet Take 1 tablet (325 mg total) by mouth daily. 30 tablet 0  . clonazePAM (KLONOPIN) 1 MG tablet Take 1 tablet (1 mg total) by mouth 3 (three) times daily as needed for anxiety. 12 tablet 0  . darunavir-cobicistat (PREZCOBIX) 800-150 MG tablet Take 1 tablet by mouth daily with breakfast. Swallow whole. Do NOT crush, break or chew tablets. Take with food. 30 tablet 0  . diphenhydrAMINE (SOMINEX) 25 MG tablet Take 25 mg by mouth every 6 (six) hours as needed for itching.    . docusate (COLACE) 50 MG/5ML liquid Take 10 mLs (100 mg total) by mouth 2 (two) times daily as needed for mild constipation. 100 mL 0  . dolutegravir (TIVICAY) 50 MG tablet Take 1 tablet (50 mg total) by mouth daily. 30 tablet 1  . emtricitabine-tenofovir AF (DESCOVY) 200-25 MG tablet Take 1 tablet by mouth daily. 30 tablet 0  . fentaNYL (DURAGESIC - DOSED MCG/HR) 25 MCG/HR patch Place 1 patch (25 mcg total) onto the skin every  3 (three) days. 5 patch 0  . fluconazole (DIFLUCAN) 200 MG tablet Take 1 tablet (200 mg total) by mouth daily. 30 tablet 0  . gabapentin (NEURONTIN) 600 MG tablet Take 600 mg by mouth 3 (three) times daily.    Marland Kitchen guaiFENesin-dextromethorphan (ROBITUSSIN DM) 100-10 MG/5ML syrup Take 5 mLs by mouth every 4 (four) hours as needed for cough. 118 mL 0  . insulin aspart (NOVOLOG) 100 UNIT/ML injection CBG < 70: implement hypoglycemia protocol  CBG 70 - 120: 0  units  CBG 121 - 150: 2 units  CBG 151 - 200: 3 units  CBG 201 - 250: 5 units  CBG 251 - 300: 7 units  CBG 301 - 350: 10 units  CBG 351 - 400: 12 units  CBG > 400 call MD and obtain STAT lab verification 10 mL 11  . insulin glargine (LANTUS) 100 UNIT/ML injection Inject 0.15 mLs (15 Units total) into the skin 2 (two) times daily. 10 mL 11  . ipratropium-albuterol (DUONEB) 0.5-2.5 (3) MG/3ML SOLN Take 3 mLs by nebulization every 4 (four) hours as needed (shortness of breathe).    Marland Kitchen oxyCODONE (OXY IR/ROXICODONE) 5 MG immediate release tablet Take 1 tablet (5 mg total) by mouth every 4 (four) hours as needed for severe pain. 180 tablet 0  . pantoprazole sodium (PROTONIX) 40 mg/20 mL PACK Take 20 mLs (40 mg total) by mouth daily. 30 each 1  . polyvinyl alcohol (LIQUIFILM TEARS) 1.4 % ophthalmic solution Place 1 drop into both eyes daily as needed for dry eyes.    Marland Kitchen sulfamethoxazole-trimethoprim (BACTRIM,SEPTRA) 200-40 MG/5ML suspension Take 20 mLs by mouth daily. 100 mL 2    Social;  reports that he has been smoking.  He has never used smokeless tobacco. He reports that he drinks about 0.6 oz of alcohol per week . He reports that he does not use drugs.I have no information on his previous living status.  Review of systems Gen. patient does not complain of fever or chills Respiratory no overt shortness of breath or cough. Cardiac no chest pain GI; he is complaining of lower abdominal and back pain. Not clear about his bowel status. No vomiting GU states he is voiding. Tells me they took his catheter out. He has a resolving scrotal wound per the notes. Neurologic; weakness in both legs although he states he can still feel his legs Musculoskeletal; he is complaining of lower back pain Mental status; I note that he was seen in the hospital by psychiatry did not think he had capacity Skin no other rashes Endocrine not really clear whether he has been on diabetes medications previously or not. CBGs  in the 120s to 130s he has been refusing both insulins  Physical exam Gen. patient is calm and cooperative in bed HEENT pupils equal and reactive. Oral exam reveals his tongue to be coated. He has a reasonable gag reflex Neck; tracheostomy scar. The orifice appears to be closed Lymph nonpalpable in the cervical axilla rate or inguinal area Respiratory clear entry bilaterally Cardiac heart sounds are normal he appears to be euvolemic GI no liver no spleen. There is no overt acute findings GU bladder distended to the level of the umbilicus Musculoskeletal; no evidence of active synovitis Neurologic; the patient has bilateral lower extremity paralysis. There is muscular wasting but no fasciculations. He has absent knee and ankle jerks. Both plantar responses are equivocal. He claims to have normal sensation in his lower legs I like another chance to retest this.  Mental status he is orientated to the month year. He is able to name the president. Told me he thought this was [place]. States his responsible party is his mother who lives in [place]  Impression/plan #1 complications of what was felt to be HSV infection with meningoencephalitis and transverse myelitis. The absence of hyperreflexia and without a clear sensory level I think would be odd for transverse myelitis #2 severe urinary retention he is going to need a Foley catheter. He is already had greater than 1000 cc on catheterization. We'll clamp the catheter and release and an hour #3 he has a history of prostate surgery and January. He'll definitely need a prostate exam I did not do this today. #4 he has apparent scrotal lesion which is improving. He was seen by urology in the hospital. I did not look at this today. Also apparent genital warts #5 I think this man is probably competent at this point. He is not eating and drinking because of the consistency of his food. I'm likely to try and change his diet. #6 HIV; On Descoby, and  prezcobix. He is on Bactroban and Diflucan I think his prophylaxis #7 type 2 diabetes. I'll need to verify the history here. He is currently on insulin both Lantus and NovoLog that he is refusing blood sugars are running in the 120s to 130s.

## 2017-01-10 ENCOUNTER — Other Ambulatory Visit (HOSPITAL_COMMUNITY): Payer: Self-pay | Admitting: Specialist

## 2017-01-10 DIAGNOSIS — R1319 Other dysphagia: Secondary | ICD-10-CM

## 2017-01-12 ENCOUNTER — Non-Acute Institutional Stay (SKILLED_NURSING_FACILITY): Payer: Medicaid Other | Admitting: Internal Medicine

## 2017-01-12 ENCOUNTER — Encounter: Payer: Self-pay | Admitting: Internal Medicine

## 2017-01-12 DIAGNOSIS — N390 Urinary tract infection, site not specified: Secondary | ICD-10-CM | POA: Diagnosis not present

## 2017-01-12 LAB — URINE CULTURE: Culture: >=100000 — AB

## 2017-01-12 NOTE — Progress Notes (Signed)
Location:   [place] Room Number: 151/P Place of Service:  SNF (31) Provider:  [place]ville Lewis  No PCP Per Patient  Patient Care Team: No Pcp Per Patient as PCP - General (General Practice)  Extended Emergency Contact Information Primary Emergency Contact: ?,[facility] Address: [address]          [place], [place] [address] Johnnette Litter of [place] Phone: [phone] Relation: None Secondary Emergency Contact: Herbert Moors [place] of [place] Phone: [phone] Mobile Phone: [phone] Relation: Mother  Code Status:  DNR Goals of care: Advanced Directive information Advanced Directives 01/12/2017  Does Patient Have a Medical Advance Directive? Yes  Type of Advance Directive Out of facility DNR (pink MOST or yellow form)  Does patient want to make changes to medical advance directive? No - Patient declined  Would patient like information on creating a medical advance directive? No - Patient declined     Chief Complaint  Patient presents with  . Acute Visit  Secondary to UTI  HPI:  Pt is a 51 y.o. male seen today for an acute visit for follow-up of UTI.  Patient was found to have urinary retention over the weekend-he does have a indwelling Foley catheter-the urine culture also was obtained which is grown out Escherichia coli ESBL-.  He has been afebrile clinically appears to be a bit stronger here Appears to be somewhat more comfortable.  He has a very complicated medical history including HIV-substance abuse medical noncompliance.  He presented initially the hospital with septic shock and altered mental status because of bacterial meningitis.  He was treated aggressively.  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  MRA suggested fibromuscular dysplasia.  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  He was also treated for complicated UTI with ESBL Klebsiella.  At one time he can't do trach but this has since been removed.  He completed a 21 day course of acyclovir he is on Bactrim for PCP prophylaxis.  Currently he is denying any fever or chills he appears to be doing better feeling stronger although still is quite weak.       Past Medical History:  Diagnosis Date  . COPD (chronic obstructive pulmonary disease) ([place])   . Genital warts   . HIV (human immunodeficiency virus infection) ([place])   . Osteoporosis    Past Surgical History:  Procedure Laterality Date  . PROSTATE SURGERY  11/17/2016    Allergies  Allergen Reactions  . Iodine   . Ketorolac     unknown  . Tylenol [Acetaminophen]     unknown    Current Facility-Administered Medications on File Prior to Visit  Medication Dose Route Frequency Provider Last Rate Last Dose  . chlorhexidine gluconate (MEDLINE KIT) (PERIDEX) 0.12 % solution 15 mL  15 mL Mouth Rinse BID [place], MD      . MEDLINE mouth rinse  15 mL Mouth Rinse 10 times per day Rush Landmark, MD       Current Outpatient Prescriptions on File Prior to Visit  Medication Sig Dispense Refill  . aspirin 325 MG tablet Take 1 tablet (325 mg total) by mouth daily. 30 tablet 0  . clonazePAM (KLONOPIN) 1 MG tablet Take 1 tablet (1 mg total) by mouth 3 (three) times daily  as needed for anxiety. 12 tablet 0  . darunavir-cobicistat (PREZCOBIX) 800-150 MG tablet Take 1 tablet by mouth daily with breakfast. Swallow whole. Do NOT crush, break or chew tablets. Take with food. 30 tablet 0  . diphenhydrAMINE (SOMINEX) 25 MG tablet Take 25 mg by mouth every 6 (six) hours as needed for itching.    . docusate (COLACE) 50 MG/5ML liquid Take 10 mLs (100 mg total) by mouth 2 (two) times daily as needed for mild constipation. 100 mL 0  . dolutegravir (TIVICAY) 50 MG tablet  Take 1 tablet (50 mg total) by mouth daily. 30 tablet 1  . emtricitabine-tenofovir AF (DESCOVY) 200-25 MG tablet Take 1 tablet by mouth daily. 30 tablet 0  . fentaNYL (DURAGESIC - DOSED MCG/HR) 25 MCG/HR patch Place 1 patch (25 mcg total) onto the skin every 3 (three) days. 5 patch 0  . fluconazole (DIFLUCAN) 200 MG tablet Take 1 tablet (200 mg total) by mouth daily. 30 tablet 0  . gabapentin (NEURONTIN) 600 MG tablet Take 600 mg by mouth 3 (three) times daily.    Marland Kitchen guaiFENesin-dextromethorphan (ROBITUSSIN DM) 100-10 MG/5ML syrup Take 5 mLs by mouth every 4 (four) hours as needed for cough. 118 mL 0  . insulin aspart (NOVOLOG) 100 UNIT/ML injection CBG < 70: implement hypoglycemia protocol  CBG 70 - 120: 0 units  CBG 121 - 150: 2 units  CBG 151 - 200: 3 units  CBG 201 - 250: 5 units  CBG 251 - 300: 7 units  CBG 301 - 350: 10 units  CBG 351 - 400: 12 units  CBG > 400 call MD and obtain STAT lab verification 10 mL 11  . ipratropium-albuterol (DUONEB) 0.5-2.5 (3) MG/3ML SOLN Take 3 mLs by nebulization every 4 (four) hours as needed (shortness of breathe).    Marland Kitchen oxyCODONE (OXY IR/ROXICODONE) 5 MG immediate release tablet Take 1 tablet (5 mg total) by mouth every 4 (four) hours as needed for severe pain. 180 tablet 0  . pantoprazole sodium (PROTONIX) 40 mg/20 mL PACK Take 20 mLs (40 mg total) by mouth daily. 30 each 1  . polyvinyl alcohol (LIQUIFILM TEARS) 1.4 % ophthalmic solution Place 1 drop into both eyes daily as needed for dry eyes.    Marland Kitchen sulfamethoxazole-trimethoprim (BACTRIM,SEPTRA) 200-40 MG/5ML suspension Take 20 mLs by mouth daily. 100 mL 2    Review of Systems   General is not complaining any fever or chills.  Respiratory does not complain of over shortness breath or coughing.  Cardiac is not complaining of any chest pain.  GI is not really complaining of abdominal pain today apparently is having bowel movements.  GU does not complain of overt dysuria again has had his history of  urinary retention.  Muscle skeletal continues to complain at times of back pain but per nursing may feel this is better controlled he appears to be comfortable currently in bed.  Neurologic is not complaining of dizziness or headache.  Psych he is cooperative pleasant today--per nursing staff he appears to be in improving spirits     There is no immunization history on file for this patient. Pertinent  Health Maintenance Due  Topic Date Due  . FOOT EXAM  12/06/1975  . OPHTHALMOLOGY EXAM  12/06/1975  . URINE MICROALBUMIN  12/06/1975  . COLONOSCOPY  12/06/2015  . INFLUENZA VACCINE  10/08/2017 (Originally 06/08/2016)  . HEMOGLOBIN A1C  06/02/2017   No flowsheet data found. Functional Status Survey:    Vitals:   01/12/17 1500  BP: 128/75  Pulse: 86  Resp: 20  Temp: 98.4 F (36.9 C)  TempSrc: Oral    Physical Exam  In general this is a frail appearing middle-aged male in no acute distress appears to be lying fairly comfortably in bed.  Head ears eyes nose mouth and throat pupils are reactive to light visual acuity appears grossly intact.  Chest is clear to auscultation there is no labored breathing.  Heart is regular rate and rhythm without murmur gallop or rub.  Abdomen is soft does not appear to be acutely tender there are positive bowel sounds.  GU could not really appreciate suprapubic distention he does have a Foley catheter placed with a small amount of amber colored urine.  Musculoskeletal does have general frailty with significant bilateral lower extremity paralysis-weakness-he does have diffuse muscle wasting.  Neurologic he is alert cranial nerves appear grossly intact speech is clear but he does not talk a lot.  Psych he is pleasant and cooperative     Labs reviewed:  Recent Labs  12/12/16 0319 12/13/16 0219 12/15/16 0439  12/19/16 0910  12/31/16 0546 01/02/17 0600 01/07/17 0700  NA 133* 136 138  < > 149*  < > 136 136 133*  K 5.2* 4.1 3.5  < >  3.5  < > 4.2 4.3 4.6  CL 90* 97* 97*  < > 113*  < > 103 97* 95*  CO2 34* 35* 30  < > 27  < > _0 GLUCOSE 245* 223* 219*  < > 217*  < > 94 72 140*  BUN 23* 24* 28*  < > 51*  < > 27* 20 23*  CREATININE 0.83 0.82 0.79  < > 1.16  < > 1.18 1.31* 1.31*  CALCIUM 8.1* 8.4* 9.1  < > 9.3  < > 8.8* 8.5* 8.7*  MG 1.7 2.0  --   --  2.5*  --   --   --   --   PHOS 3.2 2.6 3.8  --   --   --   --   --   --   < > = values in this interval not displayed.  Recent Labs  12/31/16 0546 01/02/17 0600 01/07/17 0700  AST 33 47* 34  ALT 62 62 53  ALKPHOS 87 81 61  BILITOT 0.6 0.6 0.5  PROT 6.8 6.6 7.2  ALBUMIN 2.1* 2.0* 2.4*    Recent Labs  11/28/16 2205 12/02/16 0009  12/31/16 0546 01/02/17 0600 01/07/17 0700  WBC 10.9* 20.1*  < > 12.4* 7.9 8.7  NEUTROABS 7.0 18.7*  --   --   --  6.4  HGB 9.1* 9.0*  < > 12.4* 11.5* 11.5*  HCT 27.4* 28.2*  < > 38.7* 35.7* 34.9*  MCV 95.8 97.2  < > 95.3 94.4 93.8  PLT 260 229  < > 239 203 203  < > = values in this interval not displayed. Lab Results  Component Value Date   TSH 0.982 12/19/2016   Lab Results  Component Value Date   HGBA1C 6.6 (H) 12/03/2016   Lab Results  Component Value Date   CHOL 141 12/09/2016   HDL 16 (L) 12/09/2016   LDLCALC 93 12/09/2016   TRIG 217 (H) 12/12/2016   CHOLHDL 8.8 12/09/2016    Significant Diagnostic Results in last 30 days:  Ct Abdomen Pelvis Wo Contrast  Result Date: 12/17/2016 CLINICAL DATA:  Bowel obstruction EXAM: CT ABDOMEN AND PELVIS WITHOUT CONTRAST TECHNIQUE: Multidetector CT imaging of the  abdomen and pelvis was performed following the standard protocol without IV contrast. COMPARISON:  12/16/2016, CT 11/29/2016 FINDINGS: Lower chest: Atelectasis or partial consolidation in the right lower lobe. Dense partial left lower lobe consolidation concerning for a pneumonia. Normal heart size. Hepatobiliary: No focal hepatic abnormality. The gallbladder is enlarged up to 5.1 cm. No calcified stones. No wall  thickening. No biliary dilatation. Pancreas: Unremarkable. No pancreatic ductal dilatation or surrounding inflammatory changes. Spleen: Normal in size without focal abnormality. Adrenals/Urinary Tract: Adrenal glands are unremarkable. Kidneys are normal, without renal calculi, focal lesion, or hydronephrosis. Bladder contains a Foley catheter Stomach/Bowel: Esophageal tube is present in the stomach and the tip terminates in the proximal duodenum. There are borderline to slightly dilated fluid-filled loops of small bowel throughout the abdomen. There is enlarged fluid filled right colon. No wall thickening. The appendix is not well identified. Vascular/Lymphatic: Non aneurysmal aorta. No grossly enlarged lymph nodes. Reproductive: No masses. Other: No free air.  No free fluid. Musculoskeletal: No acute or suspicious bone lesions IMPRESSION: 1. Borderline to mildly enlarged loops of small bowel throughout the abdomen with dilated fluid-filled right colon and transverse colon, findings are suggestive of an ileus. Partial bowel obstruction included in the differential but felt less likely. 2. Partial consolidation in the left lower lobe concerning for pneumonia. Electronically Signed   By: Donavan Foil M.D.   On: 12/17/2016 01:36   Dg Chest 1 View  Result Date: 12/26/2016 CLINICAL DATA:  Altered mental status.  HIV disease EXAM: CHEST 1 VIEW COMPARISON:  December 23, 2016 FINDINGS: Tracheostomy catheter tip is 7.0 cm above the carina. No pneumothorax. There is airspace consolidation throughout much the left lower lobe which was also present previously but appears somewhat more coalesced that this time. There is a small left pleural effusion. Right lung is clear. Heart size and pulmonary vascularity are normal. No adenopathy. No bone lesions. IMPRESSION: Airspace consolidation consistent with pneumonia left lower lobe with small left pleural effusion. Lungs elsewhere clear. Tracheostomy as described. No  pneumothorax. Cardiac silhouette within normal limits. Electronically Signed   By: Lowella Grip III M.D.   On: 12/26/2016 09:00   Dg Chest 2 View  Result Date: 01/07/2017 CLINICAL DATA:  Recurrent pneumonia, followup exam EXAM: CHEST  2 VIEW COMPARISON:  12/26/2016 FINDINGS: Cardiac shadow is stable. Previously seen infiltrate in the left lung has improved significantly although residual is noted in the lower lobe. No effusion is seen. No new focal infiltrate is noted. No bony abnormality is seen. IMPRESSION: Improved but persistent left lower lobe pneumonia. Continued follow-up is recommended. Electronically Signed   By: Inez Catalina M.D.   On: 01/07/2017 12:51   Dg Abd 1 View  Result Date: 12/23/2016 CLINICAL DATA:  Feeding tube placement EXAM: ABDOMEN - 1 VIEW COMPARISON:  December 22, 2016 FINDINGS: Feeding tube tip is at the junction of the second and third portions of the duodenum. Bowel gas pattern is normal. No bowel obstruction. No free air. Moderate stool is present in the colon. IMPRESSION: Feeding tube tip at junction of second and third portions of duodenum. Bowel gas pattern normal. Electronically Signed   By: Lowella Grip III M.D.   On: 12/23/2016 13:45   Mr Brain Wo Contrast  Result Date: 12/26/2016 CLINICAL DATA:  Altered mental status EXAM: MRI HEAD WITHOUT CONTRAST TECHNIQUE: Multiplanar, multiecho pulse sequences of the brain and surrounding structures were obtained without intravenous contrast. COMPARISON:  12/09/2016 FINDINGS: Brain: Resolved areas of restricted diffusion. On sagittal T1  weighted imaging there are small areas of T1 hyperintensity along the areas of previously described infarct along the sylvian fissures, likely cortical laminar necrosis. No blood products are noted. Mild FLAIR hyperintensity in the regions of fourth ventricular and left inferior cerebellar disease previously. No signs of acute or interval infarct. No swelling or evidence of  sulcal/intraventricular debris. No hydrocephalus or mass. Vascular: Preserved flow voids Skull and upper cervical spine: Negative Sinuses/Orbits: Fluid levels in bilateral maxillary and sphenoid sinuses and in the left more than right mastoids. IMPRESSION: 1. No acute or interval finding. Acute findings on previous exam are resolved. 2. Expected evolution of small scattered infarcts seen previously. 3. Bilateral paranasal sinus and mastoid fluid levels. Electronically Signed   By: Monte Fantasia M.D.   On: 12/26/2016 08:32   Dg Chest Port 1 View  Result Date: 12/23/2016 CLINICAL DATA:  51 year old male with history of cough. EXAM: PORTABLE CHEST 1 VIEW COMPARISON:  Chest x-ray 12/19/2016. FINDINGS: Worsening patchy multifocal airspace disease in the left mid to lower lung and at the right lung base, concerning for progressive multilobar bronchopneumonia. Probable small left pleural effusion appears slightly increased compared to the prior study. No definite right pleural effusion. No evidence of pulmonary edema. Heart size appears borderline to mildly enlarged. Upper mediastinal contours are within normal limits. Feeding tube extends into the stomach, but the tip is below the lower margin of the images. Previously noted nasogastric tube has been removed. Tracheostomy tube in place with tip terminating 5.8 cm above the carina. IMPRESSION: 1. Worsening bilateral lower lobe multilobar bronchopneumonia (left greater than right) with small left parapneumonic pleural effusion. 2. Support apparatus, as above. Electronically Signed   By: Vinnie Langton M.D.   On: 12/23/2016 13:20   Dg Chest Port 1 View  Result Date: 12/19/2016 CLINICAL DATA:  Respiratory distress EXAM: PORTABLE CHEST 1 VIEW COMPARISON:  12/16/2016 FINDINGS: Support devices are unchanged. Heart is borderline enlarged. Left lower lobe atelectasis or infiltrate with small left effusion. This is similar to prior study. No focal opacity on the right.  IMPRESSION: Stable left lower lobe atelectasis or infiltrate with small left effusion. Electronically Signed   By: Rolm Baptise M.D.   On: 12/19/2016 07:32   Dg Chest Port 1 View  Result Date: 12/16/2016 CLINICAL DATA:  Acute respiratory failure EXAM: PORTABLE CHEST 1 VIEW COMPARISON:  12/15/2016 FINDINGS: Cardiac shadow is stable. Feeding catheter and tracheostomy tube are again seen. The left jugular central line is not well appreciated on this exam. Persistent left retrocardiac density is noted. No focal infiltrate is seen on the right. No pneumothorax is seen. IMPRESSION: Persistent left basilar infiltrate. Electronically Signed   By: Inez Catalina M.D.   On: 12/16/2016 07:54   Dg Chest Port 1 View  Result Date: 12/15/2016 CLINICAL DATA:  Pulmonary edema. EXAM: PORTABLE CHEST 1 VIEW COMPARISON:  12/13/2016 and 12/12/2016 FINDINGS: Tracheostomy tube is in place. Left jugular vein catheter is unchanged with the tip at the junction of the jugular vein and left innominate vein. Pulmonary vascular congestion has improved. The infiltrate and effusion at the left lung base process. New slight atelectasis at the right lung base. IMPRESSION: 1. A persistent infiltrate and effusion at the left lung base, stable. 2. New slight atelectasis at the right lung base. 3. Improved pulmonary vascular prominence. Electronically Signed   By: Lorriane Shire M.D.   On: 12/15/2016 07:34   Dg Chest Port 1 View  Result Date: 12/13/2016 CLINICAL DATA:  Respiratory failure  EXAM: PORTABLE CHEST 1 VIEW COMPARISON:  12/12/2016 FINDINGS: Tracheostomy tube is noted. Left jugular central line is again seen and stable. The nasogastric catheter has been removed. Left retrocardiac density is noted consistent with atelectasis/infiltrate. Small left pleural effusion is noted. Mild vascular congestion is noted. IMPRESSION: Stable left retrocardiac infiltrate and small effusion. Mild vascular congestion is seen. Electronically Signed   By: Inez Catalina M.D.   On: 12/13/2016 17:10   Dg Abd Portable 1v  Result Date: 12/23/2016 CLINICAL DATA:  Enteric tube placement EXAM: PORTABLE ABDOMEN - 1 VIEW COMPARISON:  Abdominal radiograph from earlier today FINDINGS: Weighted enteric tube terminates in the right upper quadrant of the abdomen in the region of the pylorus. No dilated small bowel loops. No evidence of pneumatosis or pneumoperitoneum. Patchy left lung base opacity. IMPRESSION: Weighted enteric tube terminates in the right upper quadrant in the region of the pylorus. Nonobstructive bowel gas pattern. Patchy left lung base opacity. Electronically Signed   By: Ilona Sorrel M.D.   On: 12/23/2016 17:19   Dg Abd Portable 1v  Result Date: 12/22/2016 CLINICAL DATA:  Assess feeding tube position. EXAM: PORTABLE ABDOMEN - 1 VIEW COMPARISON:  KUB of December 20, 2016 FINDINGS: The radiodense tip of the feeding tube lies in the region of the pylorus or first portion of the duodenum. There is some looping of the feeding tube in the gastric cardia. The bowel gas pattern is within the limits of normal. The bony structures exhibit no acute abnormalities. IMPRESSION: There has been distal migration of the feeding tube since at the tip lies in the region of the pylorus or first portion of the duodenum. Electronically Signed   By: David  Martinique M.D.   On: 12/22/2016 15:31   Dg Abd Portable 1v  Result Date: 12/20/2016 CLINICAL DATA:  Ileus EXAM: PORTABLE ABDOMEN - 1 VIEW COMPARISON:  To 09/2017 FINDINGS: Feeding tube tip in the duodenum near the ligament of Treitz, unchanged. Small bowel dilatation has progressed. Mild small bowel distention. Gas in the colon without dilatation. NG tube in the stomach. IMPRESSION: Mildly dilated small bowel loops with progression since the prior study. Electronically Signed   By: Franchot Gallo M.D.   On: 12/20/2016 06:41   Dg Abd Portable 1v  Result Date: 12/19/2016 CLINICAL DATA:  Ileus EXAM: PORTABLE ABDOMEN - 1 VIEW  COMPARISON:  12/18/2016 FINDINGS: NG tube is in the stomach. Feeding tube tip in the distal duodenum. Nonobstructive bowel gas pattern. Gas throughout nondistended large and small bowel. No free air organomegaly. IMPRESSION: Improved bowel distention.  Nonobstructive pattern currently. Feeding tube tip remains in the distal duodenum. Electronically Signed   By: Rolm Baptise M.D.   On: 12/19/2016 09:12   Dg Abd Portable 1v  Result Date: 12/18/2016 CLINICAL DATA:  Ileus. EXAM: PORTABLE ABDOMEN - 1 VIEW COMPARISON:  12/17/2016. FINDINGS: Improved gaseous distention of the abdomen. Support tubes and apparatus appears stable. IMPRESSION: Improved gaseous distention. Electronically Signed   By: Staci Righter M.D.   On: 12/18/2016 08:26   Dg Abd Portable 1v  Result Date: 12/17/2016 CLINICAL DATA:  Followup abdominal distension. EXAM: PORTABLE ABDOMEN - 1 VIEW FINDINGS: There is an NG tube with its tip in the antropyloric region of the stomach. There is also a feeding tube with its tip in the fourth portion of the duodenum. The lung bases are grossly clear. Persistent air distended small bowel and colon suggesting a diffuse ileus. No free air. IMPRESSION: Persistent diffuse ileus bowel gas pattern.  NG and feeding tubes in good position. Electronically Signed   By: Marijo Sanes M.D.   On: 12/17/2016 09:39   Dg Abd Portable 1v  Result Date: 12/16/2016 CLINICAL DATA:  Assess nasogastric tube positioning. EXAM: PORTABLE ABDOMEN - 1 VIEW COMPARISON:  KUB of December 16, 2016 at 9:22 a.m. FINDINGS: The radiodense tipped feeding tube tip projects below the inferior margin of the image. The orogastric tube has its proximal port in the gastric cardia with the tip re-curved upon itself pointing toward the GE junction. There are loops of moderately distended gas-filled small and large bowel present. There is dense atelectasis or pneumonia in the left lower lobe. IMPRESSION: The tip of the feeding tube projects below the  inferior margin of the image. Both the proximal port and the tip of the orogastric tube lie in the region of the gastric cardia but the tip of the tube is directed back toward the GE junction. Electronically Signed   By: David  Martinique M.D.   On: 12/16/2016 15:58   Dg Abd Portable 1v  Result Date: 12/16/2016 CLINICAL DATA:  Abdominal distention EXAM: PORTABLE ABDOMEN - 1 VIEW COMPARISON:  December 14, 2016 FINDINGS: Feeding tube tip is in the third portion of the duodenum, unchanged. There are multiple loops of dilated bowel without bowel wall thickening. No free air evident. IMPRESSION: Bowel gas pattern is concerning for ileus or possibly early bowel obstruction. No demonstrable free air. Feeding tube tip in third portion duodenum. Electronically Signed   By: Lowella Grip III M.D.   On: 12/16/2016 09:37   Dg Abd Portable 1v  Result Date: 12/14/2016 CLINICAL DATA:  Nasogastric tube placement. EXAM: PORTABLE ABDOMEN - 1 VIEW COMPARISON:  12/07/2016 FINDINGS: Nasogastric tube enters the stomach in has its tip at the proximal fourth portion of the duodenum. Upper abdominal bowel gas pattern is unremarkable. Atelectasis persists at the left base. IMPRESSION: Nasogastric tube tip at the proximal fourth portion of the duodenum. Electronically Signed   By: Nelson Chimes M.D.   On: 12/14/2016 16:26   Dg Swallowing Func-speech Pathology  Result Date: 12/30/2016 Objective Swallowing Evaluation: Type of Study: MBS-Modified Barium Swallow Study Patient Details Name: ELIESER TETRICK MRN: [id number] Date of Birth: 01-31-1966 Today's Date: 12/30/2016 Time: SLP Start Time (ACUTE ONLY): 1306-SLP Stop Time (ACUTE ONLY): 1331 SLP Time Calculation (min) (ACUTE ONLY): 25 min Past Medical History: Past Medical History: Diagnosis Date . COPD (chronic obstructive pulmonary disease) ([place])  . Genital warts  . HIV (human immunodeficiency virus infection) ([place])  . Osteoporosis  Past Surgical History: Past Surgical History: Procedure  Laterality Date . PROSTATE SURGERY  11/17/2016 HPI: 51 year old man with HIV/AIDS (first diagnosed 2003, COPD, and cocaine substance abuse, recent surgery in [place] (?) who presented to [facility] on 1/24 with complaint of altered mental status. Found to be hypertensive to 240/112. 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 MBS 2/19 recommended Dys 1, pudding thick liquids.  No Data Recorded Assessment / Plan / Recommendation CHL IP CLINICAL IMPRESSIONS 12/30/2016 Clinical Impression Swallow function mildy decreased from prior 2/19 with increased motor impairments observed. 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 Mr. Sakuma exhibited a mod-severe pharyngeal dysphagia characterized by both silent aspiration and penetration with nectar and honey thick liquids, which was present with several trials. Verbal cues to cough, throat clear, and second swallows did not effectively remove  the aspirated/penetrated barium or residue. Delayed swallow initiation to valleculae and pyriform sinuses noted throughout the study due to decreased sensation. Significantly increased vallecular and pyriform sinus residue compared to prior MBS (12/27/16) due to reduced tongue base retraction and laryngeal elevation; impaired cervical esophageal relaxation resulted in barium stasis above and below the UES and slow transit (not observed during 2/19 MBS). Educated pt re: severity of swallow impairments and the high aspiration risk associated primarily with consistencies thinner than pudding thick. Pt was adamant about consuming a regular diet and thin liquids despite SLP education. Recommend Dys 1 (pureed solids), pudding thick liquids via spoon, and meds crushed. MD notified who stated she will speak with pt and pt's mother. ST will f/u. SLP Visit Diagnosis Dysphagia, pharyngeal  phase (R13.13);Dysphagia, pharyngoesophageal phase (R13.14) Attention and concentration deficit following -- Frontal lobe and executive function deficit following -- Impact on safety and function Severe aspiration risk   CHL IP TREATMENT RECOMMENDATION 12/30/2016 Treatment Recommendations Therapy as outlined in treatment plan below   Prognosis 12/30/2016 Prognosis for Safe Diet Advancement Fair Barriers to Reach Goals Severity of deficits Barriers/Prognosis Comment -- CHL IP DIET RECOMMENDATION 12/30/2016 SLP Diet Recommendations Pudding thick liquid;Dysphagia 1 (Puree) solids Liquid Administration via Spoon;No straw Medication Administration Crushed with puree Compensations Slow rate;Small sips/bites Postural Changes Seated upright at 90 degrees   CHL IP OTHER RECOMMENDATIONS 12/30/2016 Recommended Consults -- Oral Care Recommendations Oral care BID Other Recommendations Order thickener from pharmacy   CHL IP FOLLOW UP RECOMMENDATIONS 12/30/2016 Follow up Recommendations Other (comment)   CHL IP FREQUENCY AND DURATION 12/30/2016 Speech Therapy Frequency (ACUTE ONLY) min 2x/week Treatment Duration 2 weeks      CHL IP ORAL PHASE 12/30/2016 Oral Phase WFL Oral - Pudding Teaspoon -- Oral - Pudding Cup -- Oral - Honey Teaspoon -- Oral - Honey Cup WFL Oral - Nectar Teaspoon -- Oral - Nectar Cup WFL Oral - Nectar Straw -- Oral - Thin Teaspoon -- Oral - Thin Cup -- Oral - Thin Straw -- Oral - Puree WFL Oral - Mech Soft -- Oral - Regular -- Oral - Multi-Consistency -- Oral - Pill -- Oral Phase - Comment --  CHL IP PHARYNGEAL PHASE 12/30/2016 Pharyngeal Phase Impaired Pharyngeal- Pudding Teaspoon -- Pharyngeal -- Pharyngeal- Pudding Cup -- Pharyngeal -- Pharyngeal- Honey Teaspoon NT Pharyngeal -- Pharyngeal- Honey Cup Delayed swallow initiation-vallecula;Delayed swallow initiation-pyriform sinuses;Penetration/Aspiration during swallow;Pharyngeal residue - valleculae;Pharyngeal residue - pyriform;Pharyngeal residue - cp  segment;Reduced tongue base retraction;Reduced laryngeal elevation Pharyngeal Material enters airway, remains ABOVE vocal cords then ejected out;Material enters airway, passes BELOW cords without attempt by patient to eject out (silent aspiration) Pharyngeal- Nectar Teaspoon NT Pharyngeal -- Pharyngeal- Nectar Cup Delayed swallow initiation-vallecula;Delayed swallow initiation-pyriform sinuses;Penetration/Aspiration during swallow;Pharyngeal residue - valleculae;Pharyngeal residue - pyriform;Pharyngeal residue - cp segment;Reduced tongue base retraction Pharyngeal Material enters airway, passes BELOW cords without attempt by patient to eject out (silent aspiration) Pharyngeal- Nectar Straw -- Pharyngeal -- Pharyngeal- Thin Teaspoon -- Pharyngeal -- Pharyngeal- Thin Cup -- Pharyngeal -- Pharyngeal- Thin Straw -- Pharyngeal -- Pharyngeal- Puree Pharyngeal residue - valleculae;Pharyngeal residue - pyriform;Reduced tongue base retraction;Reduced laryngeal elevation Pharyngeal -- Pharyngeal- Mechanical Soft -- Pharyngeal -- Pharyngeal- Regular -- Pharyngeal -- Pharyngeal- Multi-consistency -- Pharyngeal -- Pharyngeal- Pill -- Pharyngeal -- Pharyngeal Comment --  CHL IP CERVICAL ESOPHAGEAL PHASE 12/30/2016 Cervical Esophageal Phase Impaired Pudding Teaspoon -- Pudding Cup -- Honey Teaspoon -- Honey Cup Reduced cricopharyngeal relaxation Nectar Teaspoon -- Nectar Cup Reduced cricopharyngeal relaxation Nectar Straw -- Thin Teaspoon --  Thin Cup -- Thin Straw -- Puree Reduced cricopharyngeal relaxation Mechanical Soft -- Regular -- Multi-consistency -- Pill -- Cervical Esophageal Comment -- No flowsheet data found. [place] Siren 12/30/2016, 3:15 PM  Orbie Pyo Colvin Caroli.Ed CCC-SLP Pager [phone]             Dg Swallowing Func-speech Pathology  Result Date: 12/27/2016 Objective Swallowing Evaluation: Type of Study: MBS-Modified Barium Swallow Study Patient Details Name: MAXEN ROWLAND MRN: [id number] Date of  Birth: 09-29-1966 Today's Date: 12/27/2016 Time: SLP Start Time (ACUTE ONLY): 0930-SLP Stop Time (ACUTE ONLY): 0950 SLP Time Calculation (min) (ACUTE ONLY): 20 min Past Medical History: Past Medical History: Diagnosis Date . COPD (chronic obstructive pulmonary disease) ([place])  . Genital warts  . HIV (human immunodeficiency virus infection) ([place])  . Osteoporosis  Past Surgical History: Past Surgical History: Procedure Laterality Date . PROSTATE SURGERY  11/17/2016 HPI: 51 year old man with HIV/AIDS (first diagnosed 2003, COPD, and cocaine substance abuse, recent surgery in [place] (?) who presented to [facility] on 1/24 with complaint of altered mental status. Found to be hypertensive to 240/112. 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 No Data Recorded Assessment / Plan / Recommendation CHL IP CLINICAL IMPRESSIONS 12/27/2016 Clinical Impression MBS completed wearing Passy-Muir speaking valve. Pharyngeal impairments primarily originate from decreased sensation with minimal motor deficits. Valleculae was site of swallow initiation for most trials. Pyriform sinsus appear shallow and small with min-mild residue silently aspirated after the swallow. Larygneal penetration consistent during the swallow with honey thick barium without awareness. Recommend Dys 1 (puree) only and pudding thick liquids (nothing thinner than puree/pudding), donn speaking valve with all meals/meds, slow rate, small bites.   SLP Visit Diagnosis Dysphagia, pharyngeal phase (R13.13) Attention and concentration deficit following -- Frontal lobe and executive function deficit following -- Impact on safety and function Moderate aspiration risk   CHL IP TREATMENT RECOMMENDATION 12/27/2016 Treatment Recommendations Therapy as outlined in treatment plan below   Prognosis 12/27/2016 Prognosis for Safe Diet  Advancement Good Barriers to Reach Goals -- Barriers/Prognosis Comment -- CHL IP DIET RECOMMENDATION 12/27/2016 SLP Diet Recommendations Dysphagia 1 (Puree) solids;Pudding thick liquid Liquid Administration via Spoon Medication Administration Crushed with puree Compensations Slow rate;Small sips/bites Postural Changes Seated upright at 90 degrees   CHL IP OTHER RECOMMENDATIONS 12/27/2016 Recommended Consults -- Oral Care Recommendations Oral care BID Other Recommendations Order thickener from pharmacy   CHL IP FOLLOW UP RECOMMENDATIONS 12/27/2016 Follow up Recommendations (No Data)   CHL IP FREQUENCY AND DURATION 12/27/2016 Speech Therapy Frequency (ACUTE ONLY) min 2x/week Treatment Duration 2 weeks      CHL IP ORAL PHASE 12/27/2016 Oral Phase WFL Oral - Pudding Teaspoon -- Oral - Pudding Cup -- Oral - Honey Teaspoon -- Oral - Honey Cup -- Oral - Nectar Teaspoon -- Oral - Nectar Cup -- Oral - Nectar Straw -- Oral - Thin Teaspoon -- Oral - Thin Cup -- Oral - Thin Straw -- Oral - Puree -- Oral - Mech Soft -- Oral - Regular -- Oral - Multi-Consistency -- Oral - Pill -- Oral Phase - Comment --  CHL IP PHARYNGEAL PHASE 12/27/2016 Pharyngeal Phase Impaired Pharyngeal- Pudding Teaspoon -- Pharyngeal -- Pharyngeal- Pudding Cup -- Pharyngeal -- Pharyngeal- Honey Teaspoon Delayed swallow initiation-vallecula;Pharyngeal residue - valleculae;Pharyngeal residue - pyriform;Reduced tongue base retraction;Reduced laryngeal elevation;Penetration/Apiration after swallow Pharyngeal Material enters airway, passes BELOW cords without attempt by patient to  eject out (silent aspiration);Material enters airway, remains ABOVE vocal cords and not ejected out Pharyngeal- Honey Cup -- Pharyngeal -- Pharyngeal- Nectar Teaspoon Delayed swallow initiation-vallecula;Penetration/Aspiration during swallow;Pharyngeal residue - valleculae;Pharyngeal residue - pyriform;Reduced tongue base retraction;Reduced laryngeal elevation Pharyngeal Material enters  airway, passes BELOW cords without attempt by patient to eject out (silent aspiration) Pharyngeal- Nectar Cup -- Pharyngeal -- Pharyngeal- Nectar Straw -- Pharyngeal -- Pharyngeal- Thin Teaspoon -- Pharyngeal -- Pharyngeal- Thin Cup -- Pharyngeal -- Pharyngeal- Thin Straw -- Pharyngeal -- Pharyngeal- Puree Delayed swallow initiation-vallecula Pharyngeal -- Pharyngeal- Mechanical Soft -- Pharyngeal -- Pharyngeal- Regular -- Pharyngeal -- Pharyngeal- Multi-consistency -- Pharyngeal -- Pharyngeal- Pill -- Pharyngeal -- Pharyngeal Comment --  CHL IP CERVICAL ESOPHAGEAL PHASE 12/27/2016 Cervical Esophageal Phase WFL Pudding Teaspoon -- Pudding Cup -- Honey Teaspoon -- Honey Cup -- Nectar Teaspoon -- Nectar Cup -- Nectar Straw -- Thin Teaspoon -- Thin Cup -- Thin Straw -- Puree -- Mechanical Soft -- Regular -- Multi-consistency -- Pill -- Cervical Esophageal Comment -- No flowsheet data found. [place] Siren 12/27/2016, 10:44 AM Orbie Pyo Colvin Caroli.Ed Engineer, agricultural [phone]               Assessment/Plan . #1 UTI ESBL he will need to be on contact precautions have spoken to nursing about this-will treat with Macrobid 100 mg twice a day for 7 days-I have reviewed sensitivities and discuss this with Dr. Dellia Nims as well.  Also will update a CBC with differential tomorrow to keep an eye on his white count again he appears to be doing better here has been somewhat stronger.  In regards to pain he continues on a fentanyl patch 25 g a day as well as OxyIR 5 mg every 4 hours when necessary-at this point will monitor he appears to be fairly comfortable nursing staff reports this as well but will have to be monitored   [id number]--of note greater than 25 minutes spent assessing patient-discussing his status with nursing staff-reviewing his chart-his labs-and discussion with Dr. Dellia Nims about plan of care-of note greater than 50% of time spent coordinating plan of care

## 2017-01-13 ENCOUNTER — Other Ambulatory Visit: Payer: Self-pay | Admitting: *Deleted

## 2017-01-13 ENCOUNTER — Encounter (HOSPITAL_COMMUNITY)
Admission: RE | Admit: 2017-01-13 | Discharge: 2017-01-13 | Disposition: A | Discharge status: Home / Self Care | Payer: Medicaid Other | Source: Skilled Nursing Facility | Attending: Internal Medicine | Admitting: Internal Medicine

## 2017-01-13 DIAGNOSIS — G009 Bacterial meningitis, unspecified: Secondary | ICD-10-CM | POA: Insufficient documentation

## 2017-01-13 DIAGNOSIS — D62 Acute posthemorrhagic anemia: Secondary | ICD-10-CM | POA: Insufficient documentation

## 2017-01-13 DIAGNOSIS — B20 Human immunodeficiency virus [HIV] disease: Secondary | ICD-10-CM | POA: Insufficient documentation

## 2017-01-13 DIAGNOSIS — D72829 Elevated white blood cell count, unspecified: Secondary | ICD-10-CM | POA: Insufficient documentation

## 2017-01-13 DIAGNOSIS — E118 Type 2 diabetes mellitus with unspecified complications: Secondary | ICD-10-CM | POA: Insufficient documentation

## 2017-01-13 LAB — CULTURE, FUNGUS WITHOUT SMEAR

## 2017-01-13 MED ORDER — FENTANYL 25 MCG/HR TD PT72: 25.0000 ug | MEDICATED_PATCH | TRANSDERMAL | 0 refills | Status: AC

## 2017-01-13 NOTE — Telephone Encounter (Signed)
[facility] #[phone] Fax: [phone]   

## 2017-01-14 ENCOUNTER — Encounter (HOSPITAL_COMMUNITY)
Admission: RE | Admit: 2017-01-14 | Discharge: 2017-01-14 | Disposition: A | Discharge status: Home / Self Care | Payer: Medicaid Other | Source: Skilled Nursing Facility | Attending: Internal Medicine | Admitting: Internal Medicine

## 2017-01-14 DIAGNOSIS — M6281 Muscle weakness (generalized): Secondary | ICD-10-CM | POA: Insufficient documentation

## 2017-01-14 DIAGNOSIS — B20 Human immunodeficiency virus [HIV] disease: Secondary | ICD-10-CM | POA: Insufficient documentation

## 2017-01-14 DIAGNOSIS — G009 Bacterial meningitis, unspecified: Secondary | ICD-10-CM | POA: Insufficient documentation

## 2017-01-14 DIAGNOSIS — M818 Other osteoporosis without current pathological fracture: Secondary | ICD-10-CM | POA: Insufficient documentation

## 2017-01-14 DIAGNOSIS — K219 Gastro-esophageal reflux disease without esophagitis: Secondary | ICD-10-CM | POA: Insufficient documentation

## 2017-01-17 ENCOUNTER — Ambulatory Visit (HOSPITAL_COMMUNITY): Payer: Medicaid Other | Attending: Internal Medicine | Admitting: Speech Pathology

## 2017-01-17 ENCOUNTER — Encounter (HOSPITAL_COMMUNITY): Payer: Self-pay | Admitting: Speech Pathology

## 2017-01-17 ENCOUNTER — Ambulatory Visit (HOSPITAL_COMMUNITY)
Admission: RE | Admit: 2017-01-17 | Discharge: 2017-01-17 | Disposition: A | Discharge status: Home / Self Care | Payer: Medicaid Other | Source: Ambulatory Visit | Attending: Internal Medicine | Admitting: Internal Medicine

## 2017-01-17 DIAGNOSIS — R1319 Other dysphagia: Secondary | ICD-10-CM | POA: Diagnosis present

## 2017-01-17 DIAGNOSIS — R1312 Dysphagia, oropharyngeal phase: Secondary | ICD-10-CM | POA: Insufficient documentation

## 2017-01-17 LAB — ACID FAST CULTURE WITH REFLEXED SENSITIVITIES

## 2017-01-17 LAB — ACID FAST CULTURE WITH REFLEXED SENSITIVITIES (MYCOBACTERIA): Acid Fast Culture: NEGATIVE

## 2017-01-17 NOTE — Therapy (Signed)
[facility] [address] [place], [place], [address] Phone: [phone]   Fax:  [phone]  Modified Barium Swallow  Patient Details  Name: Lawrence NorthDarren E Kane MRN: [id number] Date of Birth: 18-Oct-1966 No Data Recorded  Encounter Date: 01/17/2017      End of Session - 01/17/17 1942    Visit Number 1   Number of Visits 1   Authorization Type Medicaid   SLP Start Time 1440   SLP Stop Time  1513   SLP Time Calculation (min) 33 min   Activity Tolerance Patient tolerated treatment well      Past Medical History:  Diagnosis Date  . COPD (chronic obstructive pulmonary disease) ([facility])   . Genital warts   . HIV (human immunodeficiency virus infection) ([facility])   . Osteoporosis     Past Surgical History:  Procedure Laterality Date  . PROSTATE SURGERY  11/17/2016    There were no vitals filed for this visit.      Subjective Assessment - 01/17/17 1936    Subjective Pt seen upright in [place] chair for MBSS   Special Tests MBSS   Currently in Pain? No/denies             General - 01/17/17 1835      General Information   HPI Lawrence Kane is a [age] year old man who has a history of HIV, substance abuse, medical noncompliance. He was referred for MBSS by Dr. Baltazar NajjarMichael Robson. [facility] with recommendation for pudding thick liquids and puree. He presented to hospital with septic shock and altered mental status due to bacterial meningitis. He was intubated and started on broad-spectrum antibiotics as well as acyclovir for scrotal lesions. He will underwent a lumbar punctures that showed a white count high with predominance of eosinophils question parasitic infection. Cytology negative for malignant cells or parasites. MRI was done that showed small infarcts in the cerebellum and cortex due to vasospasm or embolic disease. 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 MRAsuggested fibromuscular dysplasia. His infarcts were felt to be secondary to vasculitis and vasospasm secondary to meningoencephalitis. MRI of total spine was negative for abscess hematoma. 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 He was treated for a complicated UTI ESBL Klebsiella and a tracheostomy placed on 2/5 although that since been removed.. The patient completed a 21 day course of acyclovir. Now he is on Bactrim for PCP prophylaxis. [facility] 01/04[facility]17.   Type of Study MBS-Modified Barium Swallow Study   Previous Swallow Assessment MBS 12/27/2016 and 12/30/2016   Diet Prior to this Study Dysphagia 1 (puree);Pudding-thick liquids   Temperature Spikes Noted No   Respiratory Status Room air   Behavior/Cognition Alert;Cooperative;Pleasant mood   Oral Cavity Assessment Within Functional Limits   Oral Care Completed by SLP No   Oral Cavity - Dentition Adequate natural dentition   Vision Functional for self feeding   Self-Feeding Abilities Able to feed self   Patient Positioning Upright in chair   Baseline Vocal Quality Low vocal intensity   Volitional Cough Strong   Volitional Swallow Able to elicit   Anatomy Within functional limits   Pharyngeal Secretions Not observed secondary MBS            Oral Preparation/Oral Phase - 01/17/17 1938      Oral Preparation/Oral Phase   Oral Phase Impaired  Electrical stimulation - Oral Phase   Was Electrical Stimulation Used No          Pharyngeal Phase - 01/17/17 1939      Pharyngeal Phase   Pharyngeal Phase Impaired     Pharyngeal - Nectar   Pharyngeal- Nectar Cup Swallow initiation at pyriform sinus;Reduced tongue base retraction;Reduced epiglottic inversion;Pharyngeal residue - valleculae;Pharyngeal residue - pyriform  trace/mild residuals; repeat swallow effective     Pharyngeal - Thin   Pharyngeal- Thin Teaspoon Swallow initiation at  vallecula;Reduced epiglottic inversion;Reduced tongue base retraction  Pt swallowed 3x for one tsp   Pharyngeal- Thin Cup Swallow initiation at vallecula;Swallow initiation at pyriform sinus;Reduced epiglottic inversion;Reduced tongue base retraction;Penetration/Aspiration during swallow;Pharyngeal residue - valleculae;Pharyngeal residue - pyriform;Pharyngeal residue - cp segment   Pharyngeal Material does not enter airway;Material enters airway, remains ABOVE vocal cords then ejected out   Pharyngeal- Thin Straw Swallow initiation at pyriform sinus;Reduced epiglottic inversion;Reduced tongue base retraction;Penetration/Aspiration during swallow;Pharyngeal residue - cp segment   Pharyngeal Material does not enter airway;Material enters airway, remains ABOVE vocal cords then ejected out     Pharyngeal - Solids   Pharyngeal- Puree Swallow initiation at vallecula;Reduced epiglottic inversion;Reduced tongue base retraction;Pharyngeal residue - valleculae;Pharyngeal residue - pyriform;Pharyngeal residue - cp segment   Pharyngeal- Mechanical Soft Swallow initiation at vallecula;Reduced epiglottic inversion;Reduced tongue base retraction;Pharyngeal residue - valleculae   Pharyngeal- Pill Swallow initiation at vallecula;Reduced epiglottic inversion;Reduced tongue base retraction     Pharyngeal Phase - Comment   Pharyngeal Comment Pt noted to have trace, silent aspiration of thins when taking mixed consistencies (pill and thin; thin and crackers); cued cough was effective in clearing     Electrical Stimulation - Pharyngeal Phase   Was Electrical Stimulation Used No          Cricopharyngeal Phase - 01/17/17 1940      Cervical Esophageal Phase   Cervical Esophageal Phase Impaired     Cervical Esophageal Phase - Thin   Thin Cup Reduced cricopharyngeal relaxation           Plan - 01/17/17 1943    Clinical Impression Statement Pt assessed in the lateral position with barium tinged thin, NTL,  puree, mixed consistencies, regular textures, and barium tablet. Pt presents with mild oral phase dysphagia with delayed oral transit and piecemeal deglutition; mild/moderate pharyngeal phase dysphagia characterized by swallow trigger at the level of the valleculae for small sips thin, puree, and solids; swallow trigger at the level of the pyriforms with larger sips thin. Pt with decreased tongue base retraction and epiglottic deflection resulting in flash penetration of thins during the swallow and mild vallecular, pyriform, and UES residuals. Repeat swallows are effective in reducing residuals and pt tends to implement independently. Pt noted to have more difficulty swallowing barium tablet with thin (premature spillage of thins and stasis in oral cavity with pill which eventually moved to valleculae and eventually cleared with sequential cup sips thin) and with mixed consistencies (Pt chewing crackers and asked to take a sip of liquid). Although unwitnessed, Pt aspirated trace amount of thins silently as it was noted along anterior tracheal wall post swallow. SLP cued pt to cough and he was successful in clearing aspirated material. Recommend D3/mech soft (due to suspected impulsivity but ok to clinically upgrade per treating SLP) and thin liquids, no straws, avoid mixed consistencies and cue Pt to cough periodically after thins. Treating SLP may decide to continue with NTL during meals for safety and advance as appropriate. PO medications whole  in puree or crushed. Continued dysphagia therapy may be indicated to focus on increasing breath support, vocal fold closure, tongue base retraction.      Treatment/Interventions Aspiration precaution training;SLP instruction and feedback;Compensatory strategies;Patient/family education;Diet toleration management by SLP;Pharyngeal strengthening exercises   Potential to Achieve Goals Fair   Potential Considerations Ability to learn/carryover information   Consulted  and Agree with Plan of Care Patient      Patient will benefit from skilled therapeutic intervention in order to improve the following deficits and impairments:   Dysphagia, oropharyngeal phase        Recommendations/Treatment - 01/17/17 1941      Swallow Evaluation Recommendations   SLP Diet Recommendations Dysphagia 3 (mechanical soft);Thin   Liquid Administration via Cup   Medication Administration Whole meds with puree   Supervision Patient able to self feed   Compensations Slow rate;Small sips/bites;Multiple dry swallows after each bite/sip   Postural Changes Seated upright at 90 degrees;Remain upright for at least 30 minutes after feeds/meals          Prognosis - 01/17/17 1941      Prognosis   Prognosis for Safe Diet Advancement Fair   Barriers to Reach Goals Cognitive deficits     Individuals Consulted   Consulted and Agree with Results and Recommendations Patient   Report Sent to  Referring physician;Facility (Comment)      Problem List Patient Active Problem List   Diagnosis Date Noted  . Dysphagia   . Adjustment disorder with mixed anxiety and depressed mood 12/30/2016  . Goals of care, counseling/discussion   . Palliative care encounter   . Respiratory distress   . Agitation   . Hypernatremia   . Leukocytosis   . Acute blood loss anemia   . Pain   . Acute respiratory failure with hypoxia (HCC)   . Tracheostomy status (HCC)   . Hypoxemia   . Muscle weakness (generalized)   . Ileus (HCC)   . Abdominal distention   . Transverse myelitis (HCC)   . Encounter for orogastric (OG) tube placement   . Somnolence   . Paraplegia (HCC)   . Acute pulmonary edema (HCC)   . Neurosyphilis   . Herpesviral meningitis   . Cerebral embolism with cerebral infarction 12/08/2016  . Endotracheally intubated   . Meningoencephalitis   . Acute encephalopathy   . Acute respiratory failure (HCC) 12/02/2016  . Altered mental status   . COPD (chronic obstructive  pulmonary disease) (HCC) 12/01/2016  . Substance abuse 12/01/2016  . Genital warts 12/01/2016  . HIV (human immunodeficiency virus infection) (HCC) 12/01/2016  . AIDS (acquired immune deficiency syndrome) (HCC) 12/01/2016  . Noncompliance 12/01/2016  . Bacterial meningitis 12/01/2016  . Hypertensive urgency 12/01/2016  . Septic shock (HCC) 12/01/2016  . Transaminitis 12/01/2016  . Cocaine abuse 12/01/2016  . Open wound 12/01/2016   Thank you,  Havery Moros, CCC-SLP [phone]  [facility] 01/17/2017, 8:39 PM  [place] [facility] [address] [place], [place], [address] Phone: [phone]   Fax:  [phone]  Name: CONSTANTIN HILLERY MRN: [id number] Date of Birth: 1966-10-31

## 2017-01-19 ENCOUNTER — Non-Acute Institutional Stay (SKILLED_NURSING_FACILITY): Payer: Medicaid Other | Admitting: Internal Medicine

## 2017-01-19 DIAGNOSIS — G373 Acute transverse myelitis in demyelinating disease of central nervous system: Secondary | ICD-10-CM

## 2017-01-19 DIAGNOSIS — N390 Urinary tract infection, site not specified: Secondary | ICD-10-CM | POA: Diagnosis not present

## 2017-01-19 DIAGNOSIS — G8220 Paraplegia, unspecified: Secondary | ICD-10-CM | POA: Diagnosis not present

## 2017-01-19 NOTE — Progress Notes (Signed)
This is an acute visit.  Level care skilled.  Facility is [facility].  Chief complaint-acute visit follow-up transverse myelitis-pain management  History of present illness-this a 51 year old male with a complicated history including HIV-substance abuse-medical noncompliance-.  He presented to hospital with septic shock and altered mental status due to bacterial meningitis.  Require intubation and was started on broad-spectrum robotics as well as acyclovir.  Lumbar puncture showed a white count high with predominance of eosinophils--question parasitic intervention.  MRI shows small infarcts in the cerebellum and cortex due to vasospasm or embolic disease.  When she is just the vasculopathy was suggested by neurology but pattern on MRI did not really demonstrate this.  MRA suggested fibromuscular dysplasia.  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  He also completed course for ESBL Klebsiella.  He is on Bactrim for PCP prophylaxis.  Clinically he appears to have improved somewhat during his stay here he is working with therapy saw him.  He is finishing treatment for Escherichia coli ESBL with Macrobid-he does have an indwelling Foley catheter secondary to urinary retention issues.  He is not complaining of fever chills vital signs appear to be stable at times he will have a slightly elevated temperature currently is afebrile tonight at times it appears he does have a temperature slightly over 99.  We did attempt to do a CBC with differential late last week the patient refused lab draw we did discuss this with him again tonight and he is agreed to try to do this in the morning  His main complaint continues to be pain-he is on extensive medication including Duragesic patch 25 g-oxycodone 5 mg every 4 hours when necessary--he will take this at times  every 4-6 hours and overnight does not take it much--there appears to be some variability here.  He is also on Neurontin 600 mg 3 times a day.  He also continues on Klonopin 1 mg 3 times a day when necessary.  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  Tonight when we entered the room he appeared to be comfortable-with passive range of motion by nursing he did grimace-however later when he was talking to nursing and I attempted a some gentle range of motion I really did not appreciate grimacing.  Again this is somewhat challenging with his history of substance abuse in the past.  Past Medical History:  Diagnosis Date  . COPD (chronic obstructive pulmonary disease) ([place])   . Genital warts   . HIV (human immunodeficiency virus infection) ([place])   . Osteoporosis         Past Surgical History:  Procedure Laterality Date  . PROSTATE SURGERY  11/17/2016         Allergies  Allergen Reactions  . Iodine   . Ketorolac     unknown  . Tylenol [Acetaminophen]     unknown             Current Facility-Administered Medications on File Prior to Visit  Medication Dose Route Frequency Provider Last Rate Last Dose  . chlorhexidine gluconate (MEDLINE KIT) (PERIDEX) 0.12 % solution 15 mL  15 mL Mouth Rinse BID Freeland, MD      . MEDLINE mouth  rinse  15 mL Mouth Rinse 10 times per day Rush Landmark, MD             Current Outpatient Prescriptions on File Prior to Visit  Medication Sig Dispense Refill  . aspirin 325 MG tablet Take 1 tablet (325 mg total) by mouth daily. 30 tablet 0  . clonazePAM (KLONOPIN) 1 MG tablet Take 1 tablet (1 mg total) by mouth 3 (three) times daily as needed for anxiety. 12 tablet 0  . darunavir-cobicistat (PREZCOBIX) 800-150 MG tablet Take 1 tablet by mouth daily with  breakfast. Swallow whole. Do NOT crush, break or chew tablets. Take with food. 30 tablet 0  . diphenhydrAMINE (SOMINEX) 25 MG tablet Take 25 mg by mouth every 6 (six) hours as needed for itching.    . docusate (COLACE) 50 MG/5ML liquid Take 10 mLs (100 mg total) by mouth 2 (two) times daily as needed for mild constipation. 100 mL 0  . dolutegravir (TIVICAY) 50 MG tablet Take 1 tablet (50 mg total) by mouth daily. 30 tablet 1  . emtricitabine-tenofovir AF (DESCOVY) 200-25 MG tablet Take 1 tablet by mouth daily. 30 tablet 0  . fentaNYL (DURAGESIC - DOSED MCG/HR) 25 MCG/HR patch Place 1 patch (25 mcg total) onto the skin every 3 (three) days. 5 patch 0  . fluconazole (DIFLUCAN) 200 MG tablet Take 1 tablet (200 mg total) by mouth daily. 30 tablet 0  . gabapentin (NEURONTIN) 600 MG tablet Take 600 mg by mouth 3 (three) times daily.    Marland Kitchen guaiFENesin-dextromethorphan (ROBITUSSIN DM) 100-10 MG/5ML syrup Take 5 mLs by mouth every 4 (four) hours as needed for cough. 118 mL 0  . insulin aspart (NOVOLOG) 100 UNIT/ML injection CBG < 70:   implement hypoglycemia protocol            CBG 70 - 120:         0 units     CBG 121 - 150:       2 units     CBG 151 - 200:       3 units     CBG 201 - 250:       5 units     CBG 251 - 300:       7 units     CBG 301 - 350:       10 units     CBG 351 - 400:       12 units     CBG > 400  call MD and obtain STAT lab verification 10 mL 11  . ipratropium-albuterol (DUONEB) 0.5-2.5 (3) MG/3ML SOLN Take 3 mLs by nebulization every 4 (four) hours as needed (shortness of breathe).    Marland Kitchen oxyCODONE (OXY IR/ROXICODONE) 5 MG immediate release tablet Take 1 tablet (5 mg total) by mouth every 4 (four) hours as needed for severe pain. 180 tablet 0  . pantoprazole sodium (PROTONIX) 40 mg/20 mL PACK Take 20 mLs (40 mg total) by mouth daily. 30 each 1  . polyvinyl alcohol (LIQUIFILM TEARS) 1.4 % ophthalmic solution Place 1 drop into both eyes daily as needed for dry eyes.    Marland Kitchen  sulfamethoxazole-trimethoprim (BACTRIM,SEPTRA) 200-40 MG/5ML suspension Take 20 mLs by mouth daily. 100 mL 2    Review of Systems   General is not complaining any fever or chills.  Respiratory does not complain of  shortness breath or coughing.  Cardiac is not complaining of any chest pain.  GI is not really  complaining of abdominal pain today apparently is having bowel movements.  GU does not complain of overt dysuria again has had his history of urinary retention.Has an indwelling Foley catheter is completing a course of Macrobid  Muscle skeletal continues to complain at times of pain basically from his hip down to his legs  Neurologic is not complaining of dizziness or headache.  Psych --at times continues to have noncompliance-but apparently has been more cooperative recently does not complain of overt anxiety or depression     There is no immunization history on file for this patient.     Pertinent  Health Maintenance Due  Topic Date Due  . FOOT EXAM  12/06/1975  . OPHTHALMOLOGY EXAM  12/06/1975  . URINE MICROALBUMIN  12/06/1975  . COLONOSCOPY  12/06/2015  . INFLUENZA VACCINE  10/08/2017 (Originally 06/08/2016)  . HEMOGLOBIN A1C  06/02/2017   No flowsheet data found. Functional Status Survey:                           Thank you 98.4 pulse 81 respirations 18 blood pressure 127/68  Physical Exam  In general this is a frail appearing middle-aged male in no acute distress appears to be lying  comfortably in bed.  Head ears eyes nose mouth and throat --- visual acuity appears grossly intact.  Chest is clear to auscultation there is no labored breathing.--Respiratory effort is somewhat poor  Heart is regular rate and rhythm without murmur gallop or rub.  Abdomen is soft does not appear to be acutely tender there are positive bowel sounds.  GU could not really appreciate suprapubic distention he does have a Foley catheter placed with   amber colored urine.  Musculoskeletal does have general frailty with significant bilateral lower extremity paralysis-weakness-he does have diffuse muscle wasting. As noted above with passive range of motion by nursing had grimacing--a bit later appeared to be more comfortable as noted above  Neurologic he is alert cranial nerves appear grossly intact speech is clear but he does not speak a whole lot-did speak somewhat more when  we asked him about pictures on his window sill and the history behind them  Psych he is alert and oriented was cooperative with exam     Labs reviewed:  RecentLabs(withinlast365days)   Recent Labs  12/12/16 0319 12/13/16 0219 12/15/16 0439  12/19/16 0910  12/31/16 0546 01/02/17 0600 01/07/17 0700  NA 133* 136 138  < > 149*  < > 136 136 133*  K 5.2* 4.1 3.5  < > 3.5  < > 4.2 4.3 4.6  CL 90* 97* 97*  < > 113*  < > 103 97* 95*  CO2 34* 35* 30  < > 27  < > 26 28 30   GLUCOSE 245* 223* 219*  < > 217*  < > 94 72 140*  BUN 23* 24* 28*  < > 51*  < > 27* 20 23*  CREATININE 0.83 0.82 0.79  < > 1.16  < > 1.18 1.31* 1.31*  CALCIUM 8.1* 8.4* 9.1  < > 9.3  < > 8.8* 8.5* 8.7*  MG 1.7 2.0  --   --  2.5*  --   --   --   --   PHOS 3.2 2.6 3.8  --   --   --   --   --   --   < > = values in this interval not displayed.    RecentLabs(withinlast365days)   Recent Labs  12/31/16 0546 01/02/17 0600 01/07/17 0700  AST 33 47* 34  ALT 62 62 53  ALKPHOS 87 81 61  BILITOT 0.6 0.6 0.5  PROT 6.8 6.6 7.2  ALBUMIN 2.1* 2.0* 2.4*      RecentLabs(withinlast365days)   Recent Labs  11/28/16 2205 12/02/16 0009  12/31/16 0546 01/02/17 0600 01/07/17 0700  WBC 10.9* 20.1*  < > 12.4* 7.9 8.7  NEUTROABS 7.0 18.7*  --   --   --  6.4  HGB 9.1* 9.0*  < > 12.4* 11.5* 11.5*  HCT 27.4* 28.2*  < > 38.7* 35.7* 34.9*  MCV 95.8 97.2  < > 95.3 94.4 93.8  PLT 260 229  < > 239 203 203  < > = values in this interval not displayed.   RecentLabs        Lab Results  Component Value Date   TSH 0.982 12/19/2016     RecentLabs  Lab Results  Component Value Date   HGBA1C 6.6 (H) 12/03/2016     RecentLabs       Lab Results  Component Value Date   CHOL 141 12/09/2016   HDL 16 (L) 12/09/2016   LDLCALC 93 12/09/2016   TRIG 217 (H) 12/12/2016   CHOLHDL 8.8 12/09/2016      Significant Diagnostic Results in last 30 days:   ImagingResults  Ct Abdomen Pelvis Wo Contrast  Result Date: 12/17/2016 CLINICAL DATA:  Bowel obstruction EXAM: CT ABDOMEN AND PELVIS WITHOUT CONTRAST TECHNIQUE: Multidetector CT imaging of the abdomen and pelvis was performed following the standard protocol without IV contrast. COMPARISON:  12/16/2016, CT 11/29/2016 FINDINGS: Lower chest: Atelectasis or partial consolidation in the right lower lobe. Dense partial left lower lobe consolidation concerning for a pneumonia. Normal heart size. Hepatobiliary: No focal hepatic abnormality. The gallbladder is enlarged up to 5.1 cm. No calcified stones. No wall thickening. No biliary dilatation. Pancreas: Unremarkable. No pancreatic ductal dilatation or surrounding inflammatory changes. Spleen: Normal in size without focal abnormality. Adrenals/Urinary Tract: Adrenal glands are unremarkable. Kidneys are normal, without renal calculi, focal lesion, or hydronephrosis. Bladder contains a Foley catheter Stomach/Bowel: Esophageal tube is present in the stomach and the tip terminates in the proximal duodenum. There are borderline to slightly dilated fluid-filled loops of small bowel throughout the abdomen. There is enlarged fluid filled right colon. No wall thickening. The appendix is not well identified. Vascular/Lymphatic: Non aneurysmal aorta. No grossly enlarged lymph nodes. Reproductive: No masses. Other: No free air.  No free fluid. Musculoskeletal: No acute or suspicious bone lesions IMPRESSION: 1. Borderline to mildly enlarged loops of small bowel throughout the abdomen  with dilated fluid-filled right colon and transverse colon, findings are suggestive of an ileus. Partial bowel obstruction included in the differential but felt less likely. 2. Partial consolidation in the left lower lobe concerning for pneumonia. Electronically Signed   By: Donavan Foil M.D.   On: 12/17/2016 01:36   Dg Chest 1 View  Result Date: 12/26/2016 CLINICAL DATA:  Altered mental status.  HIV disease EXAM: CHEST 1 VIEW COMPARISON:  December 23, 2016 FINDINGS: Tracheostomy catheter tip is 7.0 cm above the carina. No pneumothorax. There is airspace consolidation throughout much the left lower lobe which was also present previously but appears somewhat more coalesced that this time. There is a small left pleural effusion. Right lung is clear. Heart size and pulmonary vascularity are normal. No adenopathy. No bone lesions. IMPRESSION: Airspace consolidation consistent with pneumonia left lower lobe with small left pleural effusion. Lungs elsewhere clear.  Tracheostomy as described. No pneumothorax. Cardiac silhouette within normal limits. Electronically Signed   By: Lowella Grip III M.D.   On: 12/26/2016 09:00   Dg Chest 2 View  Result Date: 01/07/2017 CLINICAL DATA:  Recurrent pneumonia, followup exam EXAM: CHEST  2 VIEW COMPARISON:  12/26/2016 FINDINGS: Cardiac shadow is stable. Previously seen infiltrate in the left lung has improved significantly although residual is noted in the lower lobe. No effusion is seen. No new focal infiltrate is noted. No bony abnormality is seen. IMPRESSION: Improved but persistent left lower lobe pneumonia. Continued follow-up is recommended. Electronically Signed   By: Inez Catalina M.D.   On: 01/07/2017 12:51   Dg Abd 1 View  Result Date: 12/23/2016 CLINICAL DATA:  Feeding tube placement EXAM: ABDOMEN - 1 VIEW COMPARISON:  December 22, 2016 FINDINGS: Feeding tube tip is at the junction of the second and third portions of the duodenum. Bowel gas pattern is  normal. No bowel obstruction. No free air. Moderate stool is present in the colon. IMPRESSION: Feeding tube tip at junction of second and third portions of duodenum. Bowel gas pattern normal. Electronically Signed   By: Lowella Grip III M.D.   On: 12/23/2016 13:45   Mr Brain Wo Contrast  Result Date: 12/26/2016 CLINICAL DATA:  Altered mental status EXAM: MRI HEAD WITHOUT CONTRAST TECHNIQUE: Multiplanar, multiecho pulse sequences of the brain and surrounding structures were obtained without intravenous contrast. COMPARISON:  12/09/2016 FINDINGS: Brain: Resolved areas of restricted diffusion. On sagittal T1 weighted imaging there are small areas of T1 hyperintensity along the areas of previously described infarct along the sylvian fissures, likely cortical laminar necrosis. No blood products are noted. Mild FLAIR hyperintensity in the regions of fourth ventricular and left inferior cerebellar disease previously. No signs of acute or interval infarct. No swelling or evidence of sulcal/intraventricular debris. No hydrocephalus or mass. Vascular: Preserved flow voids Skull and upper cervical spine: Negative Sinuses/Orbits: Fluid levels in bilateral maxillary and sphenoid sinuses and in the left more than right mastoids. IMPRESSION: 1. No acute or interval finding. Acute findings on previous exam are resolved. 2. Expected evolution of small scattered infarcts seen previously. 3. Bilateral paranasal sinus and mastoid fluid levels. Electronically Signed   By: Monte Fantasia M.D.   On: 12/26/2016 08:32   Dg Chest Port 1 View  Result Date: 12/23/2016 CLINICAL DATA:  51 year old male with history of cough. EXAM: PORTABLE CHEST 1 VIEW COMPARISON:  Chest x-ray 12/19/2016. FINDINGS: Worsening patchy multifocal airspace disease in the left mid to lower lung and at the right lung base, concerning for progressive multilobar bronchopneumonia. Probable small left pleural effusion appears slightly increased compared  to the prior study. No definite right pleural effusion. No evidence of pulmonary edema. Heart size appears borderline to mildly enlarged. Upper mediastinal contours are within normal limits. Feeding tube extends into the stomach, but the tip is below the lower margin of the images. Previously noted nasogastric tube has been removed. Tracheostomy tube in place with tip terminating 5.8 cm above the carina. IMPRESSION: 1. Worsening bilateral lower lobe multilobar bronchopneumonia (left greater than right) with small left parapneumonic pleural effusion. 2. Support apparatus, as above. Electronically Signed   By: Vinnie Langton M.D.   On: 12/23/2016 13:20   Dg Chest Port 1 View  Result Date: 12/19/2016 CLINICAL DATA:  Respiratory distress EXAM: PORTABLE CHEST 1 VIEW COMPARISON:  12/16/2016 FINDINGS: Support devices are unchanged. Heart is borderline enlarged. Left lower lobe atelectasis or infiltrate with small left effusion.  This is similar to prior study. No focal opacity on the right. IMPRESSION: Stable left lower lobe atelectasis or infiltrate with small left effusion. Electronically Signed   By: Rolm Baptise M.D.   On: 12/19/2016 07:32   Dg Chest Port 1 View  Result Date: 12/16/2016 CLINICAL DATA:  Acute respiratory failure EXAM: PORTABLE CHEST 1 VIEW COMPARISON:  12/15/2016 FINDINGS: Cardiac shadow is stable. Feeding catheter and tracheostomy tube are again seen. The left jugular central line is not well appreciated on this exam. Persistent left retrocardiac density is noted. No focal infiltrate is seen on the right. No pneumothorax is seen. IMPRESSION: Persistent left basilar infiltrate. Electronically Signed   By: Inez Catalina M.D.   On: 12/16/2016 07:54   Dg Chest Port 1 View  Result Date: 12/15/2016 CLINICAL DATA:  Pulmonary edema. EXAM: PORTABLE CHEST 1 VIEW COMPARISON:  12/13/2016 and 12/12/2016 FINDINGS: Tracheostomy tube is in place. Left jugular vein catheter is unchanged with the tip at  the junction of the jugular vein and left innominate vein. Pulmonary vascular congestion has improved. The infiltrate and effusion at the left lung base process. New slight atelectasis at the right lung base. IMPRESSION: 1. A persistent infiltrate and effusion at the left lung base, stable. 2. New slight atelectasis at the right lung base. 3. Improved pulmonary vascular prominence. Electronically Signed   By: Lorriane Shire M.D.   On: 12/15/2016 07:34   Dg Chest Port 1 View  Result Date: 12/13/2016 CLINICAL DATA:  Respiratory failure EXAM: PORTABLE CHEST 1 VIEW COMPARISON:  12/12/2016 FINDINGS: Tracheostomy tube is noted. Left jugular central line is again seen and stable. The nasogastric catheter has been removed. Left retrocardiac density is noted consistent with atelectasis/infiltrate. Small left pleural effusion is noted. Mild vascular congestion is noted. IMPRESSION: Stable left retrocardiac infiltrate and small effusion. Mild vascular congestion is seen. Electronically Signed   By: Inez Catalina M.D.   On: 12/13/2016 17:10   Dg Abd Portable 1v  Result Date: 12/23/2016 CLINICAL DATA:  Enteric tube placement EXAM: PORTABLE ABDOMEN - 1 VIEW COMPARISON:  Abdominal radiograph from earlier today FINDINGS: Weighted enteric tube terminates in the right upper quadrant of the abdomen in the region of the pylorus. No dilated small bowel loops. No evidence of pneumatosis or pneumoperitoneum. Patchy left lung base opacity. IMPRESSION: Weighted enteric tube terminates in the right upper quadrant in the region of the pylorus. Nonobstructive bowel gas pattern. Patchy left lung base opacity. Electronically Signed   By: Ilona Sorrel M.D.   On: 12/23/2016 17:19   Dg Abd Portable 1v  Result Date: 12/22/2016 CLINICAL DATA:  Assess feeding tube position. EXAM: PORTABLE ABDOMEN - 1 VIEW COMPARISON:  KUB of December 20, 2016 FINDINGS: The radiodense tip of the feeding tube lies in the region of the pylorus or first  portion of the duodenum. There is some looping of the feeding tube in the gastric cardia. The bowel gas pattern is within the limits of normal. The bony structures exhibit no acute abnormalities. IMPRESSION: There has been distal migration of the feeding tube since at the tip lies in the region of the pylorus or first portion of the duodenum. Electronically Signed   By: David  Martinique M.D.   On: 12/22/2016 15:31   Dg Abd Portable 1v  Result Date: 12/20/2016 CLINICAL DATA:  Ileus EXAM: PORTABLE ABDOMEN - 1 VIEW COMPARISON:  To 09/2017 FINDINGS: Feeding tube tip in the duodenum near the ligament of Treitz, unchanged. Small bowel dilatation has progressed. Mild  small bowel distention. Gas in the colon without dilatation. NG tube in the stomach. IMPRESSION: Mildly dilated small bowel loops with progression since the prior study. Electronically Signed   By: Franchot Gallo M.D.   On: 12/20/2016 06:41   Dg Abd Portable 1v  Result Date: 12/19/2016 CLINICAL DATA:  Ileus EXAM: PORTABLE ABDOMEN - 1 VIEW COMPARISON:  12/18/2016 FINDINGS: NG tube is in the stomach. Feeding tube tip in the distal duodenum. Nonobstructive bowel gas pattern. Gas throughout nondistended large and small bowel. No free air organomegaly. IMPRESSION: Improved bowel distention.  Nonobstructive pattern currently. Feeding tube tip remains in the distal duodenum. Electronically Signed   By: Rolm Baptise M.D.   On: 12/19/2016 09:12   Dg Abd Portable 1v  Result Date: 12/18/2016 CLINICAL DATA:  Ileus. EXAM: PORTABLE ABDOMEN - 1 VIEW COMPARISON:  12/17/2016. FINDINGS: Improved gaseous distention of the abdomen. Support tubes and apparatus appears stable. IMPRESSION: Improved gaseous distention. Electronically Signed   By: Staci Righter M.D.   On: 12/18/2016 08:26   Dg Abd Portable 1v  Result Date: 12/17/2016 CLINICAL DATA:  Followup abdominal distension. EXAM: PORTABLE ABDOMEN - 1 VIEW FINDINGS: There is an NG tube with its tip in the  antropyloric region of the stomach. There is also a feeding tube with its tip in the fourth portion of the duodenum. The lung bases are grossly clear. Persistent air distended small bowel and colon suggesting a diffuse ileus. No free air. IMPRESSION: Persistent diffuse ileus bowel gas pattern. NG and feeding tubes in good position. Electronically Signed   By: Marijo Sanes M.D.   On: 12/17/2016 09:39   Dg Abd Portable 1v  Result Date: 12/16/2016 CLINICAL DATA:  Assess nasogastric tube positioning. EXAM: PORTABLE ABDOMEN - 1 VIEW COMPARISON:  KUB of December 16, 2016 at 9:22 a.m. FINDINGS: The radiodense tipped feeding tube tip projects below the inferior margin of the image. The orogastric tube has its proximal port in the gastric cardia with the tip re-curved upon itself pointing toward the GE junction. There are loops of moderately distended gas-filled small and large bowel present. There is dense atelectasis or pneumonia in the left lower lobe. IMPRESSION: The tip of the feeding tube projects below the inferior margin of the image. Both the proximal port and the tip of the orogastric tube lie in the region of the gastric cardia but the tip of the tube is directed back toward the GE junction. Electronically Signed   By: David  Martinique M.D.   On: 12/16/2016 15:58   Dg Abd Portable 1v  Result Date: 12/16/2016 CLINICAL DATA:  Abdominal distention EXAM: PORTABLE ABDOMEN - 1 VIEW COMPARISON:  December 14, 2016 FINDINGS: Feeding tube tip is in the third portion of the duodenum, unchanged. There are multiple loops of dilated bowel without bowel wall thickening. No free air evident. IMPRESSION: Bowel gas pattern is concerning for ileus or possibly early bowel obstruction. No demonstrable free air. Feeding tube tip in third portion duodenum. Electronically Signed   By: Lowella Grip III M.D.   On: 12/16/2016 09:37   Dg Abd Portable 1v  Result Date: 12/14/2016 CLINICAL DATA:  Nasogastric tube placement.  EXAM: PORTABLE ABDOMEN - 1 VIEW COMPARISON:  12/07/2016 FINDINGS: Nasogastric tube enters the stomach in has its tip at the proximal fourth portion of the duodenum. Upper abdominal bowel gas pattern is unremarkable. Atelectasis persists at the left base. IMPRESSION: Nasogastric tube tip at the proximal fourth portion of the duodenum. Electronically Signed   By:  Nelson Chimes M.D.   On: 12/14/2016 16:26   Dg Swallowing Func-speech Pathology  Result Date: 12/30/2016 Objective Swallowing Evaluation: Type of Study: MBS-Modified Barium Swallow Study Patient Details Name: DORMAN CALDERWOOD MRN: [id number] Date of Birth: [birth year] Today's Date: 12/30/2016 Time: SLP Start Time (ACUTE ONLY): 1306-SLP Stop Time (ACUTE ONLY): 1331 SLP Time Calculation (min) (ACUTE ONLY): 25 min Past Medical History: Past Medical History: Diagnosis Date . COPD (chronic obstructive pulmonary disease) ([place])  . Genital warts  . HIV (human immunodeficiency virus infection) ([place])  . Osteoporosis  Past Surgical History: Past Surgical History: Procedure Laterality Date . PROSTATE SURGERY  11/17/2016 HPI: 51 year old man with HIV/AIDS (first diagnosed 2003, COPD, and cocaine substance abuse, recent surgery in [place] (?) who presented to [facility] on 1/24 with complaint of altered mental status. Found to be hypertensive to 240/112. 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 MBS 2/19 recommended Dys 1, pudding thick liquids.  No Data Recorded Assessment / Plan / Recommendation CHL IP CLINICAL IMPRESSIONS 12/30/2016 Clinical Impression Swallow function mildy decreased from prior 2/19 with increased motor impairments observed. 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 Mr. Fichera exhibited a mod-severe pharyngeal dysphagia characterized by both silent aspiration and penetration with nectar and  honey thick liquids, which was present with several trials. Verbal cues to cough, throat clear, and second swallows did not effectively remove the aspirated/penetrated barium or residue. Delayed swallow initiation to valleculae and pyriform sinuses noted throughout the study due to decreased sensation. Significantly increased vallecular and pyriform sinus residue compared to prior MBS (12/27/16) due to reduced tongue base retraction and laryngeal elevation; impaired cervical esophageal relaxation resulted in barium stasis above and below the UES and slow transit (not observed during 2/19 MBS). Educated pt re: severity of swallow impairments and the high aspiration risk associated primarily with consistencies thinner than pudding thick. Pt was adamant about consuming a regular diet and thin liquids despite SLP education. Recommend Dys 1 (pureed solids), pudding thick liquids via spoon, and meds crushed. MD notified who stated she will speak with pt and pt's mother. ST will f/u. SLP Visit Diagnosis Dysphagia, pharyngeal phase (R13.13);Dysphagia, pharyngoesophageal phase (R13.14) Attention and concentration deficit following -- Frontal lobe and executive function deficit following -- Impact on safety and function Severe aspiration risk   CHL IP TREATMENT RECOMMENDATION 12/30/2016 Treatment Recommendations Therapy as outlined in treatment plan below   Prognosis 12/30/2016 Prognosis for Safe Diet Advancement Fair Barriers to Reach Goals Severity of deficits Barriers/Prognosis Comment -- CHL IP DIET RECOMMENDATION 12/30/2016 SLP Diet Recommendations Pudding thick liquid;Dysphagia 1 (Puree) solids Liquid Administration via Spoon;No straw Medication Administration Crushed with puree Compensations Slow rate;Small sips/bites Postural Changes Seated upright at 90 degrees   CHL IP OTHER RECOMMENDATIONS 12/30/2016 Recommended Consults -- Oral Care Recommendations Oral care BID Other Recommendations Order thickener from pharmacy   CHL  IP FOLLOW UP RECOMMENDATIONS 12/30/2016 Follow up Recommendations Other (comment)   CHL IP FREQUENCY AND DURATION 12/30/2016 Speech Therapy Frequency (ACUTE ONLY) min 2x/week Treatment Duration 2 weeks      CHL IP ORAL PHASE 12/30/2016 Oral Phase WFL Oral - Pudding Teaspoon -- Oral - Pudding Cup -- Oral - Honey Teaspoon -- Oral - Honey Cup WFL Oral - Nectar Teaspoon -- Oral - Nectar Cup WFL Oral - Nectar Straw -- Oral - Thin Teaspoon -- Oral - Thin Cup -- Oral -  Thin Straw -- Oral - Puree WFL Oral - Mech Soft -- Oral - Regular -- Oral - Multi-Consistency -- Oral - Pill -- Oral Phase - Comment --  CHL IP PHARYNGEAL PHASE 12/30/2016 Pharyngeal Phase Impaired Pharyngeal- Pudding Teaspoon -- Pharyngeal -- Pharyngeal- Pudding Cup -- Pharyngeal -- Pharyngeal- Honey Teaspoon NT Pharyngeal -- Pharyngeal- Honey Cup Delayed swallow initiation-vallecula;Delayed swallow initiation-pyriform sinuses;Penetration/Aspiration during swallow;Pharyngeal residue - valleculae;Pharyngeal residue - pyriform;Pharyngeal residue - cp segment;Reduced tongue base retraction;Reduced laryngeal elevation Pharyngeal Material enters airway, remains ABOVE vocal cords then ejected out;Material enters airway, passes BELOW cords without attempt by patient to eject out (silent aspiration) Pharyngeal- Nectar Teaspoon NT Pharyngeal -- Pharyngeal- Nectar Cup Delayed swallow initiation-vallecula;Delayed swallow initiation-pyriform sinuses;Penetration/Aspiration during swallow;Pharyngeal residue - valleculae;Pharyngeal residue - pyriform;Pharyngeal residue - cp segment;Reduced tongue base retraction Pharyngeal Material enters airway, passes BELOW cords without attempt by patient to eject out (silent aspiration) Pharyngeal- Nectar Straw -- Pharyngeal -- Pharyngeal- Thin Teaspoon -- Pharyngeal -- Pharyngeal- Thin Cup -- Pharyngeal -- Pharyngeal- Thin Straw -- Pharyngeal -- Pharyngeal- Puree Pharyngeal residue - valleculae;Pharyngeal residue - pyriform;Reduced  tongue base retraction;Reduced laryngeal elevation Pharyngeal -- Pharyngeal- Mechanical Soft -- Pharyngeal -- Pharyngeal- Regular -- Pharyngeal -- Pharyngeal- Multi-consistency -- Pharyngeal -- Pharyngeal- Pill -- Pharyngeal -- Pharyngeal Comment --  CHL IP CERVICAL ESOPHAGEAL PHASE 12/30/2016 Cervical Esophageal Phase Impaired Pudding Teaspoon -- Pudding Cup -- Honey Teaspoon -- Honey Cup Reduced cricopharyngeal relaxation Nectar Teaspoon -- Nectar Cup Reduced cricopharyngeal relaxation Nectar Straw -- Thin Teaspoon -- Thin Cup -- Thin Straw -- Puree Reduced cricopharyngeal relaxation Mechanical Soft -- Regular -- Multi-consistency -- Pill -- Cervical Esophageal Comment -- No flowsheet data found. [place] Siren 12/30/2016, 3:15 PM  Orbie Pyo Colvin Caroli.Ed CCC-SLP Pager [phone]             Dg Swallowing Func-speech Pathology  Result Date: 12/27/2016 Objective Swallowing Evaluation: Type of Study: MBS-Modified Barium Swallow Study Patient Details Name: ZACHAREE GADDIE MRN: [id number] Date of Birth: 09-Jun-1966 Today's Date: 12/27/2016 Time: SLP Start Time (ACUTE ONLY): 0930-SLP Stop Time (ACUTE ONLY): 0950 SLP Time Calculation (min) (ACUTE ONLY): 20 min Past Medical History: Past Medical History: Diagnosis Date . COPD (chronic obstructive pulmonary disease) ([place])  . Genital warts  . HIV (human immunodeficiency virus infection) ([place])  . Osteoporosis  Past Surgical History: Past Surgical History: Procedure Laterality Date . PROSTATE SURGERY  11/17/2016 HPI: 51 year old man with HIV/AIDS (first diagnosed 2003, COPD, and cocaine substance abuse, recent surgery in [place] (?) who presented to [facility] on 1/24 with complaint of altered mental status. Found to be hypertensive to 240/112. 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 No Data Recorded Assessment /  Plan / Recommendation CHL IP CLINICAL IMPRESSIONS 12/27/2016 Clinical Impression MBS completed wearing Passy-Muir speaking valve. Pharyngeal impairments primarily originate from decreased sensation with minimal motor deficits. Valleculae was site of swallow initiation for most trials. Pyriform sinsus appear shallow and small with min-mild residue silently aspirated after the swallow. Larygneal penetration consistent during the swallow with honey thick barium without awareness. Recommend Dys 1 (puree) only and pudding thick liquids (nothing thinner than puree/pudding), donn speaking valve with all meals/meds, slow rate, small bites.   SLP Visit Diagnosis Dysphagia, pharyngeal phase (R13.13) Attention and concentration deficit following -- Frontal lobe and executive function deficit following -- Impact on safety and function Moderate aspiration risk   CHL IP TREATMENT RECOMMENDATION 12/27/2016  Treatment Recommendations Therapy as outlined in treatment plan below   Prognosis 12/27/2016 Prognosis for Safe Diet Advancement Good Barriers to Reach Goals -- Barriers/Prognosis Comment -- CHL IP DIET RECOMMENDATION 12/27/2016 SLP Diet Recommendations Dysphagia 1 (Puree) solids;Pudding thick liquid Liquid Administration via Spoon Medication Administration Crushed with puree Compensations Slow rate;Small sips/bites Postural Changes Seated upright at 90 degrees   CHL IP OTHER RECOMMENDATIONS 12/27/2016 Recommended Consults -- Oral Care Recommendations Oral care BID Other Recommendations Order thickener from pharmacy   CHL IP FOLLOW UP RECOMMENDATIONS 12/27/2016 Follow up Recommendations (No Data)   CHL IP FREQUENCY AND DURATION 12/27/2016 Speech Therapy Frequency (ACUTE ONLY) min 2x/week Treatment Duration 2 weeks      CHL IP ORAL PHASE 12/27/2016 Oral Phase WFL Oral - Pudding Teaspoon -- Oral - Pudding Cup -- Oral - Honey Teaspoon -- Oral - Honey Cup -- Oral - Nectar Teaspoon -- Oral - Nectar Cup -- Oral - Nectar Straw -- Oral - Thin  Teaspoon -- Oral - Thin Cup -- Oral - Thin Straw -- Oral - Puree -- Oral - Mech Soft -- Oral - Regular -- Oral - Multi-Consistency -- Oral - Pill -- Oral Phase - Comment --  CHL IP PHARYNGEAL PHASE 12/27/2016 Pharyngeal Phase Impaired Pharyngeal- Pudding Teaspoon -- Pharyngeal -- Pharyngeal- Pudding Cup -- Pharyngeal -- Pharyngeal- Honey Teaspoon Delayed swallow initiation-vallecula;Pharyngeal residue - valleculae;Pharyngeal residue - pyriform;Reduced tongue base retraction;Reduced laryngeal elevation;Penetration/Apiration after swallow Pharyngeal Material enters airway, passes BELOW cords without attempt by patient to eject out (silent aspiration);Material enters airway, remains ABOVE vocal cords and not ejected out Pharyngeal- Honey Cup -- Pharyngeal -- Pharyngeal- Nectar Teaspoon Delayed swallow initiation-vallecula;Penetration/Aspiration during swallow;Pharyngeal residue - valleculae;Pharyngeal residue - pyriform;Reduced tongue base retraction;Reduced laryngeal elevation Pharyngeal Material enters airway, passes BELOW cords without attempt by patient to eject out (silent aspiration) Pharyngeal- Nectar Cup -- Pharyngeal -- Pharyngeal- Nectar Straw -- Pharyngeal -- Pharyngeal- Thin Teaspoon -- Pharyngeal -- Pharyngeal- Thin Cup -- Pharyngeal -- Pharyngeal- Thin Straw -- Pharyngeal -- Pharyngeal- Puree Delayed swallow initiation-vallecula Pharyngeal -- Pharyngeal- Mechanical Soft -- Pharyngeal -- Pharyngeal- Regular -- Pharyngeal -- Pharyngeal- Multi-consistency -- Pharyngeal -- Pharyngeal- Pill -- Pharyngeal -- Pharyngeal Comment --  CHL IP CERVICAL ESOPHAGEAL PHASE 12/27/2016 Cervical Esophageal Phase WFL Pudding Teaspoon -- Pudding Cup -- Honey Teaspoon -- Honey Cup -- Nectar Teaspoon -- Nectar Cup -- Nectar Straw -- Thin Teaspoon -- Thin Cup -- Thin Straw -- Puree -- Mechanical Soft -- Regular -- Multi-consistency -- Pill -- Cervical Esophageal Comment -- No flowsheet data found. [place] Siren 12/27/2016,  10:44 AM Orbie Pyo Colvin Caroli.Ed CCC-SLP Pager [phone]                Assessment and plan.  #1-pain management-challenging issue with patient's involved history as noted above-I did discuss this with nursing as well as with Dr. Prudy Feeler this point Dr. Dellia Nims is hesitant to increase his pain medication secondary to sedation concerns-will monitor continue Duragesic patch 25 g-oxycodone 5 mg every 4 hours when necessary-as well as Neurontin 600 mg 3 times a day--in addition he is on Klonopin 1 mg 3 times a day when necessary for anxiety.  Again this will have to be monitored carefully  if pain complaints persist will readdress-again this is somewhat complicated with his history of substance abuse and also concerns for oversedation   #2 history of UTI ESBL Escherichia coli-he is completing a seven-day course of Macrobid appears to be stable  Continues with an indwelling Foley catheter.  #3 we  have attempted again to labs they were refused apparently last week he has agreed to  Let lab  try again this week will attempt to obtain a CBC with differential and CMP tomorrow if patient will allow.  [id number] -

## 2017-01-20 ENCOUNTER — Encounter (HOSPITAL_COMMUNITY)
Admission: RE | Admit: 2017-01-20 | Discharge: 2017-01-20 | Disposition: A | Discharge status: Home / Self Care | Payer: Medicaid Other | Source: Skilled Nursing Facility | Attending: Internal Medicine | Admitting: Internal Medicine

## 2017-01-20 DIAGNOSIS — G009 Bacterial meningitis, unspecified: Secondary | ICD-10-CM | POA: Insufficient documentation

## 2017-01-20 DIAGNOSIS — K219 Gastro-esophageal reflux disease without esophagitis: Secondary | ICD-10-CM | POA: Insufficient documentation

## 2017-01-20 DIAGNOSIS — E870 Hyperosmolality and hypernatremia: Secondary | ICD-10-CM | POA: Insufficient documentation

## 2017-01-20 DIAGNOSIS — M6281 Muscle weakness (generalized): Secondary | ICD-10-CM | POA: Insufficient documentation

## 2017-01-20 DIAGNOSIS — M818 Other osteoporosis without current pathological fracture: Secondary | ICD-10-CM | POA: Insufficient documentation

## 2017-01-20 DIAGNOSIS — B20 Human immunodeficiency virus [HIV] disease: Secondary | ICD-10-CM | POA: Insufficient documentation

## 2017-01-20 LAB — COMPREHENSIVE METABOLIC PANEL
ALT: 32 U/L (ref 17–63)
ANION GAP: 8 (ref 5–15)
AST: 30 U/L (ref 15–41)
Albumin: 2.4 g/dL — ABNORMAL LOW (ref 3.5–5.0)
Alkaline Phosphatase: 51 U/L (ref 38–126)
BUN: 16 mg/dL (ref 6–20)
CHLORIDE: 96 mmol/L — AB (ref 101–111)
CO2: 29 mmol/L (ref 22–32)
Calcium: 9.2 mg/dL (ref 8.9–10.3)
Creatinine, Ser: 1.25 mg/dL — ABNORMAL HIGH (ref 0.61–1.24)
GFR calc non Af Amer: >60 mL/min (ref >60)
Glucose, Bld: 103 mg/dL — ABNORMAL HIGH (ref 65–99)
POTASSIUM: 4.4 mmol/L (ref 3.5–5.1)
SODIUM: 133 mmol/L — AB (ref 135–145)
Total Bilirubin: 0.4 mg/dL (ref 0.3–1.2)
Total Protein: 7.6 g/dL (ref 6.5–8.1)

## 2017-01-20 LAB — CBC WITH DIFFERENTIAL/PLATELET
Basophils Absolute: 0.1 10*3/uL (ref 0.0–0.1)
Basophils Relative: 1 %
EOS ABS: 0.3 10*3/uL (ref 0.0–0.7)
EOS PCT: 3 %
HCT: 33.9 % — ABNORMAL LOW (ref 39.0–52.0)
Hemoglobin: 10.9 g/dL — ABNORMAL LOW (ref 13.0–17.0)
Lymphocytes Relative: 32 %
Lymphs Abs: 2.7 10*3/uL (ref 0.7–4.0)
MCH: 29.2 pg (ref 26.0–34.0)
MCHC: 32.2 g/dL (ref 30.0–36.0)
MCV: 90.9 fL (ref 78.0–100.0)
Monocytes Absolute: 1.1 10*3/uL — ABNORMAL HIGH (ref 0.1–1.0)
Monocytes Relative: 13 %
Neutro Abs: 4.3 10*3/uL (ref 1.7–7.7)
Neutrophils Relative %: 51 %
PLATELETS: 380 10*3/uL (ref 150–400)
RBC: 3.73 MIL/uL — ABNORMAL LOW (ref 4.22–5.81)
RDW: 16.9 % — ABNORMAL HIGH (ref 11.5–15.5)
WBC: 8.4 10*3/uL (ref 4.0–10.5)

## 2017-01-24 ENCOUNTER — Encounter: Payer: Self-pay | Admitting: Internal Medicine

## 2017-01-24 NOTE — Progress Notes (Signed)
Location:   [facility] Room Number: 151/P Place of Service:  SNF (31) Provider:  Granville Lewis  No PCP Per Patient  Patient Care Team: No Pcp Per Patient as PCP - General (General Practice)  Extended Emergency Contact Information Primary Emergency Contact: ?,[facility] Address: [address]          [place], [place] [address] Lawrence Kane Phone: [phone] Relation: None Secondary Emergency Contact: Lawrence Kane States of [place] Phone: [phone] Mobile Phone: [phone] Relation: Mother  Code Status:  DNR Goals of care: Advanced Directive information Advanced Directives 01/24/2017  Does Patient Have a Medical Advance Directive? Yes  Type of Advance Directive Out of facility DNR (pink MOST or yellow form)  Does patient want to make changes to medical advance directive? No - Patient declined  Would patient like information on creating a medical advance directive? No - Patient declined     Chief Complaint  Patient presents with  . Acute Visit    HPI:  Pt is a 51 y.o. male seen today for an acute visit for    Past Medical History:  Diagnosis Date  . COPD (chronic obstructive pulmonary disease) ([place])   . Genital warts   . HIV (human immunodeficiency virus infection) ([place])   . Osteoporosis    Past Surgical History:  Procedure Laterality Date  . PROSTATE SURGERY  11/17/2016    Allergies  Allergen Reactions  . Iodine   . Ketorolac     unknown  . Tylenol [Acetaminophen]     unknown   Current Facility-Administered Medications on File Prior to Visit  Medication Dose Route Frequency Provider Last Rate Last Dose  . chlorhexidine gluconate (MEDLINE KIT) (PERIDEX) 0.12 % solution 15 mL  15 mL Mouth Rinse BID No Name, MD      . MEDLINE mouth rinse  15 mL Mouth Rinse 10 times per day Rush Landmark, MD       Current Outpatient Prescriptions on File Prior to Visit  Medication Sig  Dispense Refill  . aspirin 325 MG tablet Take 1 tablet (325 mg total) by mouth daily. 30 tablet 0  . Balsam [place]-Castor Oil (VENELEX) OINT Apply to left buttocks and sacrum q shift & prn    . clonazePAM (KLONOPIN) 1 MG tablet Take 1 tablet (1 mg total) by mouth 3 (three) times daily as needed for anxiety. 12 tablet 0  . darunavir-cobicistat (PREZCOBIX) 800-150 MG tablet Take 1 tablet by mouth daily with breakfast. Swallow whole. Do NOT crush, break or chew tablets. Take with food. 30 tablet 0  . diphenhydrAMINE (SOMINEX) 25 MG tablet Take 25 mg by mouth every 6 (six) hours as needed for itching.    . docusate (COLACE) 50 MG/5ML liquid Take 10 mLs (100 mg total) by mouth 2 (two) times daily as needed for mild constipation. 100 mL 0  . dolutegravir (TIVICAY) 50 MG tablet Take 1 tablet (50 mg total) by mouth daily. 30 tablet 1  . emtricitabine-tenofovir AF (DESCOVY) 200-25 MG tablet Take 1 tablet by mouth daily. 30 tablet 0  . fentaNYL (DURAGESIC - DOSED MCG/HR) 25 MCG/HR patch Place 1 patch (25 mcg total) onto the skin every 3 (three) days. 10 patch 0  . fluconazole (DIFLUCAN) 200 MG tablet Take 1 tablet (200 mg total) by mouth daily. 30 tablet 0  . gabapentin (NEURONTIN) 600 MG tablet Take 600 mg by mouth 3 (three) times daily.    Marland Kitchen  guaiFENesin-dextromethorphan (ROBITUSSIN DM) 100-10 MG/5ML syrup Take 5 mLs by mouth every 4 (four) hours as needed for cough. 118 mL 0  . insulin aspart (NOVOLOG) 100 UNIT/ML injection CBG < 70: implement hypoglycemia protocol  CBG 70 - 120: 0 units  CBG 121 - 150: 2 units  CBG 151 - 200: 3 units  CBG 201 - 250: 5 units  CBG 251 - 300: 7 units  CBG 301 - 350: 10 units  CBG 351 - 400: 12 units  CBG > 400 call MD and obtain STAT lab verification 10 mL 11  . ipratropium-albuterol (DUONEB) 0.5-2.5 (3) MG/3ML SOLN Take 3 mLs by nebulization every 4 (four) hours as needed (shortness of breathe).    Marland Kitchen oxyCODONE (OXY IR/ROXICODONE) 5 MG immediate release tablet Take 1  tablet (5 mg total) by mouth every 4 (four) hours as needed for severe pain. 180 tablet 0  . pantoprazole sodium (PROTONIX) 40 mg/20 mL PACK Take 20 mLs (40 mg total) by mouth daily. 30 each 1  . polyvinyl alcohol (LIQUIFILM TEARS) 1.4 % ophthalmic solution Place 1 drop into both eyes daily as needed for dry eyes.    Marland Kitchen sulfamethoxazole-trimethoprim (BACTRIM,SEPTRA) 200-40 MG/5ML suspension Take 20 mLs by mouth daily. 100 mL 2    Review of Systems   There is no immunization history on file for this patient. Pertinent  Health Maintenance Due  Topic Date Due  . FOOT EXAM  12/06/1975  . OPHTHALMOLOGY EXAM  12/06/1975  . URINE MICROALBUMIN  12/06/1975  . COLONOSCOPY  12/06/2015  . INFLUENZA VACCINE  10/08/2017 (Originally 06/08/2016)  . HEMOGLOBIN A1C  06/02/2017   No flowsheet data found. Functional Status Survey:    There were no vitals filed for this visit. There is no height or weight on file to calculate BMI. Physical Exam  Labs reviewed:  Recent Labs  12/12/16 0319 12/13/16 0219 12/15/16 0439  12/19/16 0910  01/02/17 0600 01/07/17 0700 01/20/17 0700  NA 133* 136 138  < > 149*  < > 136 133* 133*  K 5.2* 4.1 3.5  < > 3.5  < > 4.3 4.6 4.4  CL 90* 97* 97*  < > 113*  < > 97* 95* 96*  CO2 34* 35* 30  < > 27  < > _0 GLUCOSE 245* 223* 219*  < > 217*  < > 72 140* 103*  BUN 23* 24* 28*  < > 51*  < > 20 23* 16  CREATININE 0.83 0.82 0.79  < > 1.16  < > 1.31* 1.31* 1.25*  CALCIUM 8.1* 8.4* 9.1  < > 9.3  < > 8.5* 8.7* 9.2  MG 1.7 2.0  --   --  2.5*  --   --   --   --   PHOS 3.2 2.6 3.8  --   --   --   --   --   --   < > = values in this interval not displayed.  Recent Labs  01/02/17 0600 01/07/17 0700 01/20/17 0700  AST 47* 34 30  ALT 62 53 32  ALKPHOS 81 61 51  BILITOT 0.6 0.5 0.4  PROT 6.6 7.2 7.6  ALBUMIN 2.0* 2.4* 2.4*    Recent Labs  12/02/16 0009  01/02/17 0600 01/07/17 0700 01/20/17 0700  WBC 20.1*  < > 7.9 8.7 8.4  NEUTROABS 18.7*  --   --  6.4  4.3  HGB 9.0*  < > 11.5* 11.5* 10.9*  HCT 28.2*  < >  35.7* 34.9* 33.9*  MCV 97.2  < > 94.4 93.8 90.9  PLT 229  < > 203 203 380  < > = values in this interval not displayed. Lab Results  Component Value Date   TSH 0.982 12/19/2016   Lab Results  Component Value Date   HGBA1C 6.6 (H) 12/03/2016   Lab Results  Component Value Date   CHOL 141 12/09/2016   HDL 16 (L) 12/09/2016   LDLCALC 93 12/09/2016   TRIG 217 (H) 12/12/2016   CHOLHDL 8.8 12/09/2016    Significant Diagnostic Results in last 30 days:  Dg Chest 1 View  Result Date: 12/26/2016 CLINICAL DATA:  Altered mental status.  HIV disease EXAM: CHEST 1 VIEW COMPARISON:  December 23, 2016 FINDINGS: Tracheostomy catheter tip is 7.0 cm above the carina. No pneumothorax. There is airspace consolidation throughout much the left lower lobe which was also present previously but appears somewhat more coalesced that this time. There is a small left pleural effusion. Right lung is clear. Heart size and pulmonary vascularity are normal. No adenopathy. No bone lesions. IMPRESSION: Airspace consolidation consistent with pneumonia left lower lobe with small left pleural effusion. Lungs elsewhere clear. Tracheostomy as described. No pneumothorax. Cardiac silhouette within normal limits. Electronically Signed   By: Lowella Grip III M.D.   On: 12/26/2016 09:00   Dg Chest 2 View  Result Date: 01/07/2017 CLINICAL DATA:  Recurrent pneumonia, followup exam EXAM: CHEST  2 VIEW COMPARISON:  12/26/2016 FINDINGS: Cardiac shadow is stable. Previously seen infiltrate in the left lung has improved significantly although residual is noted in the lower lobe. No effusion is seen. No new focal infiltrate is noted. No bony abnormality is seen. IMPRESSION: Improved but persistent left lower lobe pneumonia. Continued follow-up is recommended. Electronically Signed   By: Inez Catalina M.D.   On: 01/07/2017 12:51   Mr Brain Wo Contrast  Result Date:  12/26/2016 CLINICAL DATA:  Altered mental status EXAM: MRI HEAD WITHOUT CONTRAST TECHNIQUE: Multiplanar, multiecho pulse sequences of the brain and surrounding structures were obtained without intravenous contrast. COMPARISON:  12/09/2016 FINDINGS: Brain: Resolved areas of restricted diffusion. On sagittal T1 weighted imaging there are small areas of T1 hyperintensity along the areas of previously described infarct along the sylvian fissures, likely cortical laminar necrosis. No blood products are noted. Mild FLAIR hyperintensity in the regions of fourth ventricular and left inferior cerebellar disease previously. No signs of acute or interval infarct. No swelling or evidence of sulcal/intraventricular debris. No hydrocephalus or mass. Vascular: Preserved flow voids Skull and upper cervical spine: Negative Sinuses/Orbits: Fluid levels in bilateral maxillary and sphenoid sinuses and in the left more than right mastoids. IMPRESSION: 1. No acute or interval finding. Acute findings on previous exam are resolved. 2. Expected evolution of small scattered infarcts seen previously. 3. Bilateral paranasal sinus and mastoid fluid levels. Electronically Signed   By: Monte Fantasia M.D.   On: 12/26/2016 08:32   Dg Op Swallowing Func-medicare/speech Path  Result Date: 01/18/2017 [place], [place], [address] Phone: [phone]   Fax:  [phone] Modified Barium Swallow Patient Details Name: GRASON BRAILSFORD MRN: [id number] Date of Birth: [birth year] No Data Recorded Encounter Date: 01/17/2017   End of Session - 01/17/17 1942   Visit Number 1  Number of Visits 1  Authorization Type Medicaid  SLP Start Time 1440  SLP Stop Time  7588  SLP Time Calculation (min) 33 min  Activity Tolerance Patient tolerated  treatment well  Past Medical History: Diagnosis Date . COPD (chronic obstructive pulmonary disease) ([place])  . Genital warts  . HIV (human immunodeficiency virus  infection) ([place])  . Osteoporosis  Past Surgical History: Procedure Laterality Date . PROSTATE SURGERY  11/17/2016 There were no vitals filed for this visit.   Subjective Assessment - 01/17/17 1936   Subjective Pt seen upright in [place] chair for MBSS  Special Tests MBSS  Currently in Pain? No/denies    General - 01/17/17 [place]    General Information  HPI Marrion Sanagustin is a 51 year old man who has a history of HIV, substance abuse, medical noncompliance. He was referred for MBSS by Dr. Linton Ham. Pt with previous MBSS at [facility] in February with recommendation for pudding thick liquids and puree. He presented to hospital with septic shock and altered mental status due to bacterial meningitis. He was intubated and started on broad-spectrum antibiotics as well as acyclovir for scrotal lesions. He will underwent a lumbar punctures that showed a white count high with predominance of eosinophils question parasitic infection. Cytology negative for malignant cells or parasites. MRI was done that showed small infarcts in the cerebellum and cortex due to vasospasm or embolic disease. 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 MRAsuggested fibromuscular dysplasia. His infarcts were felt to be secondary to vasculitis and vasospasm secondary to meningoencephalitis. MRI of total spine was negative for abscess hematoma. 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 He was treated for a complicated UTI ESBL Klebsiella and a tracheostomy placed on 2/5 although that since been removed.. The patient completed a 21 day course of acyclovir. Now he is on Bactrim for PCP prophylaxis. Pt was at [facility] from 12/01/16 through 01/04/17.  Type of Study MBS-Modified Barium Swallow Study  Previous Swallow Assessment MBS 12/27/2016 and 12/30/2016  Diet Prior to this Study Dysphagia 1 (puree);Pudding-thick liquids  Temperature Spikes Noted No  Respiratory Status  Room air  Behavior/Cognition Alert;Cooperative;Pleasant mood  Oral Cavity Assessment Within Functional Limits  Oral Care Completed by SLP No  Oral Cavity - Dentition Adequate natural dentition  Vision Functional for self feeding  Self-Feeding Abilities Able to feed self  Patient Positioning Upright in chair  Baseline Vocal Quality Low vocal intensity  Volitional Cough Strong  Volitional Swallow Able to elicit  Anatomy Within functional limits  Pharyngeal Secretions Not observed secondary MBS    Oral Preparation/Oral Phase - 01/17/17 1938    Oral Preparation/Oral Phase  Oral Phase Impaired   Electrical stimulation - Oral Phase  Was Electrical Stimulation Used No    Pharyngeal Phase - 01/17/17 1939    Pharyngeal Phase  Pharyngeal Phase Impaired   Pharyngeal - Nectar  Pharyngeal- Nectar Cup Swallow initiation at pyriform sinus;Reduced tongue base retraction;Reduced epiglottic inversion;Pharyngeal residue - valleculae;Pharyngeal residue - pyriform trace/mild residuals; repeat swallow effective   Pharyngeal - Thin  Pharyngeal- Thin Teaspoon Swallow initiation at vallecula;Reduced epiglottic inversion;Reduced tongue base retraction Pt swallowed 3x for one tsp  Pharyngeal- Thin Cup Swallow initiation at vallecula;Swallow initiation at pyriform sinus;Reduced epiglottic inversion;Reduced tongue base retraction;Penetration/Aspiration during swallow;Pharyngeal residue - valleculae;Pharyngeal residue - pyriform;Pharyngeal residue - cp segment  Pharyngeal Material does not enter airway;Material enters airway, remains ABOVE vocal cords then ejected out  Pharyngeal- Thin Straw Swallow initiation at pyriform sinus;Reduced epiglottic inversion;Reduced tongue base retraction;Penetration/Aspiration during swallow;Pharyngeal residue - cp segment  Pharyngeal Material does not enter airway;Material enters airway, remains ABOVE vocal cords then ejected out  Pharyngeal - Solids  Pharyngeal- Puree Swallow initiation at vallecula;Reduced  epiglottic inversion;Reduced tongue base retraction;Pharyngeal residue - valleculae;Pharyngeal residue - pyriform;Pharyngeal residue - cp segment  Pharyngeal- Mechanical Soft Swallow initiation at vallecula;Reduced epiglottic inversion;Reduced tongue base retraction;Pharyngeal residue - valleculae  Pharyngeal- Pill Swallow initiation at vallecula;Reduced epiglottic inversion;Reduced tongue base retraction   Pharyngeal Phase - Comment  Pharyngeal Comment Pt noted to have trace, silent aspiration of thins when taking mixed consistencies (pill and thin; thin and crackers); cued cough was effective in clearing   Electrical Stimulation - Pharyngeal Phase  Was Electrical Stimulation Used No    Cricopharyngeal Phase - 01/17/17 1940    Cervical Esophageal Phase  Cervical Esophageal Phase Impaired   Cervical Esophageal Phase - Thin  Thin Cup Reduced cricopharyngeal relaxation    Plan - 01/17/17 1943   Clinical Impression Statement Pt assessed in the lateral position with barium tinged thin, NTL, puree, mixed consistencies, regular textures, and barium tablet. Pt presents with mild oral phase dysphagia with delayed oral transit and piecemeal deglutition; mild/moderate pharyngeal phase dysphagia characterized by swallow trigger at the level of the valleculae for small sips thin, puree, and solids; swallow trigger at the level of the pyriforms with larger sips thin. Pt with decreased tongue base retraction and epiglottic deflection resulting in flash penetration of thins during the swallow and mild vallecular, pyriform, and UES residuals. Repeat swallows are effective in reducing residuals and pt tends to implement independently. Pt noted to have more difficulty swallowing barium tablet with thin (premature spillage of thins and stasis in oral cavity with pill which eventually moved to valleculae and eventually cleared with sequential cup sips thin) and with mixed consistencies (Pt chewing crackers and asked to take a sip of  liquid). Although unwitnessed, Pt aspirated trace amount of thins silently as it was noted along anterior tracheal wall post swallow. SLP cued pt to cough and he was successful in clearing aspirated material. Recommend D3/mech soft (due to suspected impulsivity but ok to clinically upgrade per treating SLP) and thin liquids, no straws, avoid mixed consistencies and cue Pt to cough periodically after thins. Treating SLP may decide to continue with NTL during meals for safety and advance as appropriate. PO medications whole in puree or crushed. Continued dysphagia therapy may be indicated to focus on increasing breath support, vocal fold closure, tongue base retraction.   Treatment/Interventions Aspiration precaution training;SLP instruction and feedback;Compensatory strategies;Patient/family education;Diet toleration management by SLP;Pharyngeal strengthening exercises  Potential to Achieve Goals Fair  Potential Considerations Ability to learn/carryover information  Consulted and Agree with Plan of Care Patient  Patient will benefit from skilled therapeutic intervention in order to improve the following deficits and impairments:  Dysphagia, oropharyngeal phase   Recommendations/Treatment - 01/17/17 1941    Swallow Evaluation Recommendations  SLP Diet Recommendations Dysphagia 3 (mechanical soft);Thin  Liquid Administration via Cup  Medication Administration Whole meds with puree  Supervision Patient able to self feed  Compensations Slow rate;Small sips/bites;Multiple dry swallows after each bite/sip  Postural Changes Seated upright at 90 degrees;Remain upright for at least 30 minutes after feeds/meals    Prognosis - 01/17/17 1941    Prognosis  Prognosis for Safe Diet Advancement Fair  Barriers to Reach Goals Cognitive deficits   Individuals Consulted  Consulted and Agree with Results and Recommendations Patient  Report Sent to  Referring physician;Facility (Comment)  Problem List Patient Active Problem List   Diagnosis Date Noted . Dysphagia  . Adjustment disorder with mixed anxiety and depressed mood 12/30/2016 .  Goals of care, counseling/discussion  . Palliative care encounter  . Respiratory distress  . Agitation  . Hypernatremia  . Leukocytosis  . Acute blood loss anemia  . Pain  . Acute respiratory failure with hypoxia ([place])  . Tracheostomy status ([place])  . Hypoxemia  . Muscle weakness (generalized)  . Ileus ([place])  . Abdominal distention  . Transverse myelitis ([place])  . Encounter for orogastric (OG) tube placement  . Somnolence  . Paraplegia ([place])  . Acute pulmonary edema ([facility])  . Neurosyphilis  . Herpesviral meningitis  . Cerebral embolism with cerebral infarction 12/08/2016 . Endotracheally intubated  . Meningoencephalitis  . Acute encephalopathy  . Acute respiratory failure ([place]) 12/02/2016 . Altered mental status  . COPD (chronic obstructive pulmonary disease) ([place]) 12/01/2016 . Substance abuse 12/01/2016 . Genital warts 12/01/2016 . HIV (human immunodeficiency virus infection) ([place]) 12/01/2016 . AIDS (acquired immune deficiency syndrome) ([place]) 12/01/2016 . Noncompliance 12/01/2016 . Bacterial meningitis 12/01/2016 . Hypertensive urgency 12/01/2016 . Septic shock ([place]) 12/01/2016 . Transaminitis 12/01/2016 . Cocaine abuse 12/01/2016 . Open wound 12/01/2016 Thank you, Genene Churn, [facility] 01/17/2017, 8:39 PM [place], [place], [address] Phone: [phone]   Fax:  [phone] Name: Lawrence Kane MRN: [id number] Date of Birth: 21-May-1966 CLINICAL DATA:  HIV, suspected transverse myelitis, dysphagia, history COPD EXAM: MODIFIED BARIUM SWALLOW TECHNIQUE: Different consistencies of barium were administered orally to the patient by the Speech Pathologist. Imaging of the pharynx was performed in the lateral projection. FLUOROSCOPY TIME:  Fluoroscopy Time:  3 minutes 18 seconds Radiation Exposure Index (if provided by the  fluoroscopic device): 22.9 mGy Number of Acquired Spot Images: Screen capture of cine series during fluoroscopy COMPARISON:  None. FINDINGS: Thin liquid- spillover of contrast to the piriform sinuses. Flash laryngeal penetration with cup presentation. No aspiration. Mild vallecular and minimal piriform sinus residuals. Flash laryngeal penetration was seen on the second of 3 sequential swallows by straw presentation. Nectar thick liquid- mild spillover of contrast to the piriform sinuses. Vallecular residual. No laryngeal penetration or aspiration. Honey- not evaluated Pure- normal oropharyngeal motion. No laryngeal penetration or aspiration. Vallecular and minimal piriform sinus residuals. Cracker-no laryngeal penetration or aspiration. Vallecular and piriform sinus residuals noted. Pure with cracker- no laryngeal penetration or aspiration. Vallecular and piriform sinuses residuals noted. Barium tablet - patient swallow a 12.5 mm diameter barium tablet, which transiently delayed in the vallecula cells clear by repeat swallow. Small amount of thin barium contrast was identified in the proximal trachea following the barium tablet likely representing unwitnessed aspiration of thin barium. This was cleared with a voluntary cough. IMPRESSION: Swallowing dysfunction as above. Please refer to the Speech Pathologists report for complete details and recommendations. Electronically Signed   By: Lavonia Dana M.D.   On: 01/17/2017 15:28   Dg Swallowing Func-speech Pathology  Result Date: 12/30/2016 Objective Swallowing Evaluation: Type of Study: MBS-Modified Barium Swallow Study Patient Details Name: JOAQUIN KNEBEL MRN: [id number] Date of Birth: Jul 18, 1966 Today's Date: 12/30/2016 Time: SLP Start Time (ACUTE ONLY): 1306-SLP Stop Time (ACUTE ONLY): 1331 SLP Time Calculation (min) (ACUTE ONLY): 25 min Past Medical History: Past Medical History: Diagnosis Date . COPD (chronic obstructive pulmonary disease) ([place])  . Genital  warts  . HIV (human immunodeficiency virus infection) ([place])  . Osteoporosis  Past Surgical History: Past Surgical History: Procedure Laterality Date . PROSTATE SURGERY  11/17/2016 HPI: 51 year old man with HIV/AIDS (first diagnosed 2003, COPD, and cocaine substance abuse, recent [place] in  [place] (?) who presented to [facility] on 1/24 with complaint of altered mental status. Found to be hypertensive to 240/112. 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 MBS 2/19 recommended Dys 1, pudding thick liquids.  No Data Recorded Assessment / Plan / Recommendation CHL IP CLINICAL IMPRESSIONS 12/30/2016 Clinical Impression Swallow function mildy decreased from prior 2/19 with increased motor impairments observed. 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 Mr. Vi exhibited a mod-severe pharyngeal dysphagia characterized by both silent aspiration and penetration with nectar and honey thick liquids, which was present with several trials. Verbal cues to cough, throat clear, and second swallows did not effectively remove the aspirated/penetrated barium or residue. Delayed swallow initiation to valleculae and pyriform sinuses noted throughout the study due to decreased sensation. Significantly increased vallecular and pyriform sinus residue compared to prior MBS (12/27/16) due to reduced tongue base retraction and laryngeal elevation; impaired cervical esophageal relaxation resulted in barium stasis above and below the UES and slow transit (not observed during 2/19 MBS). Educated pt re: severity of swallow impairments and the high aspiration risk associated primarily with consistencies thinner than pudding thick. Pt was adamant about consuming a regular diet and thin liquids despite SLP education. Recommend Dys 1 (pureed solids), pudding thick liquids via spoon, and  meds crushed. MD notified who stated she will speak with pt and pt's mother. ST will f/u. SLP Visit Diagnosis Dysphagia, pharyngeal phase (R13.13);Dysphagia, pharyngoesophageal phase (R13.14) Attention and concentration deficit following -- Frontal lobe and executive function deficit following -- Impact on safety and function Severe aspiration risk   CHL IP TREATMENT RECOMMENDATION 12/30/2016 Treatment Recommendations Therapy as outlined in treatment plan below   Prognosis 12/30/2016 Prognosis for Safe Diet Advancement Fair Barriers to Reach Goals Severity of deficits Barriers/Prognosis Comment -- CHL IP DIET RECOMMENDATION 12/30/2016 SLP Diet Recommendations Pudding thick liquid;Dysphagia 1 (Puree) solids Liquid Administration via Spoon;No straw Medication Administration Crushed with puree Compensations Slow rate;Small sips/bites Postural Changes Seated upright at 90 degrees   CHL IP OTHER RECOMMENDATIONS 12/30/2016 Recommended Consults -- Oral Care Recommendations Oral care BID Other Recommendations Order thickener from pharmacy   CHL IP FOLLOW UP RECOMMENDATIONS 12/30/2016 Follow up Recommendations Other (comment)   CHL IP FREQUENCY AND DURATION 12/30/2016 Speech Therapy Frequency (ACUTE ONLY) min 2x/week Treatment Duration 2 weeks      CHL IP ORAL PHASE 12/30/2016 Oral Phase WFL Oral - Pudding Teaspoon -- Oral - Pudding Cup -- Oral - Honey Teaspoon -- Oral - Honey Cup WFL Oral - Nectar Teaspoon -- Oral - Nectar Cup WFL Oral - Nectar Straw -- Oral - Thin Teaspoon -- Oral - Thin Cup -- Oral - Thin Straw -- Oral - Puree WFL Oral - Mech Soft -- Oral - Regular -- Oral - Multi-Consistency -- Oral - Pill -- Oral Phase - Comment --  CHL IP PHARYNGEAL PHASE 12/30/2016 Pharyngeal Phase Impaired Pharyngeal- Pudding Teaspoon -- Pharyngeal -- Pharyngeal- Pudding Cup -- Pharyngeal -- Pharyngeal- Honey Teaspoon NT Pharyngeal -- Pharyngeal- Honey Cup Delayed swallow initiation-vallecula;Delayed swallow initiation-pyriform  sinuses;Penetration/Aspiration during swallow;Pharyngeal residue - valleculae;Pharyngeal residue - pyriform;Pharyngeal residue - cp segment;Reduced tongue base retraction;Reduced laryngeal elevation Pharyngeal Material enters airway, remains ABOVE vocal cords then ejected out;Material enters airway, passes BELOW cords without attempt by patient to eject out (silent aspiration) Pharyngeal- Nectar Teaspoon NT Pharyngeal -- Pharyngeal- Nectar Cup Delayed swallow initiation-vallecula;Delayed swallow initiation-pyriform sinuses;Penetration/Aspiration during swallow;Pharyngeal  residue - valleculae;Pharyngeal residue - pyriform;Pharyngeal residue - cp segment;Reduced tongue base retraction Pharyngeal Material enters airway, passes BELOW cords without attempt by patient to eject out (silent aspiration) Pharyngeal- Nectar Straw -- Pharyngeal -- Pharyngeal- Thin Teaspoon -- Pharyngeal -- Pharyngeal- Thin Cup -- Pharyngeal -- Pharyngeal- Thin Straw -- Pharyngeal -- Pharyngeal- Puree Pharyngeal residue - valleculae;Pharyngeal residue - pyriform;Reduced tongue base retraction;Reduced laryngeal elevation Pharyngeal -- Pharyngeal- Mechanical Soft -- Pharyngeal -- Pharyngeal- Regular -- Pharyngeal -- Pharyngeal- Multi-consistency -- Pharyngeal -- Pharyngeal- Pill -- Pharyngeal -- Pharyngeal Comment --  CHL IP CERVICAL ESOPHAGEAL PHASE 12/30/2016 Cervical Esophageal Phase Impaired Pudding Teaspoon -- Pudding Cup -- Honey Teaspoon -- Honey Cup Reduced cricopharyngeal relaxation Nectar Teaspoon -- Nectar Cup Reduced cricopharyngeal relaxation Nectar Straw -- Thin Teaspoon -- Thin Cup -- Thin Straw -- Puree Reduced cricopharyngeal relaxation Mechanical Soft -- Regular -- Multi-consistency -- Pill -- Cervical Esophageal Comment -- No flowsheet data found. [place] Siren 12/30/2016, 3:15 PM  Orbie Pyo Colvin Caroli.Ed CCC-SLP Pager [phone] term5             Dg Swallowing Func-speech Pathology  Result Date: 12/27/2016 Objective  Swallowing Evaluation: Type of Study: MBS-Modified Barium Swallow Study Patient Details Name: DEION SWIFT MRN: [id number] Date of Birth: Oct 19, 1966 Today's Date: 12/27/2016 Time: SLP Start Time (ACUTE ONLY): 0930-SLP Stop Time (ACUTE ONLY): 0950 SLP Time Calculation (min) (ACUTE ONLY): 20 min Past Medical History: Past Medical History: Diagnosis Date . COPD (chronic obstructive pulmonary disease) ([place])  . Genital warts  . HIV (human immunodeficiency virus infection) ([place])  . Osteoporosis  Past Surgical History: Past Surgical History: Procedure Laterality Date . PROSTATE SURGERY  11/17/2016 HPI: 51 year old man with HIV/AIDS (first diagnosed 2003, COPD, and cocaine substance abuse, recent surgery in [place] (?) who presented to [facility] on 1/24 with complaint of altered mental status. Found to be hypertensive to 240/112. 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 No Data Recorded Assessment / Plan / Recommendation CHL IP CLINICAL IMPRESSIONS 12/27/2016 Clinical Impression MBS completed wearing Passy-Muir speaking valve. Pharyngeal impairments primarily originate from decreased sensation with minimal motor deficits. Valleculae was site of swallow initiation for most trials. Pyriform sinsus appear shallow and small with min-mild residue silently aspirated after the swallow. Larygneal penetration consistent during the swallow with honey thick barium without awareness. Recommend Dys 1 (puree) only and pudding thick liquids (nothing thinner than puree/pudding), donn speaking valve with all meals/meds, slow rate, small bites.   SLP Visit Diagnosis Dysphagia, pharyngeal phase (R13.13) Attention and concentration deficit following -- Frontal lobe and executive function deficit following -- Impact on safety and function Moderate aspiration risk   CHL IP TREATMENT RECOMMENDATION  12/27/2016 Treatment Recommendations Therapy as outlined in treatment plan below   Prognosis 12/27/2016 Prognosis for Safe Diet Advancement Good Barriers to Reach Goals -- Barriers/Prognosis Comment -- CHL IP DIET RECOMMENDATION 12/27/2016 SLP Diet Recommendations Dysphagia 1 (Puree) solids;Pudding thick liquid Liquid Administration via Spoon Medication Administration Crushed with puree Compensations Slow rate;Small sips/bites Postural Changes Seated upright at 90 degrees   CHL IP OTHER RECOMMENDATIONS 12/27/2016 Recommended Consults -- Oral Care Recommendations Oral care BID Other Recommendations Order thickener from pharmacy   CHL IP FOLLOW UP RECOMMENDATIONS 12/27/2016 Follow up Recommendations (No Data)   CHL IP FREQUENCY AND DURATION 12/27/2016 Speech Therapy Frequency (ACUTE ONLY) min 2x/week Treatment Duration 2 weeks      CHL IP ORAL PHASE  12/27/2016 Oral Phase WFL Oral - Pudding Teaspoon -- Oral - Pudding Cup -- Oral - Honey Teaspoon -- Oral - Honey Cup -- Oral - Nectar Teaspoon -- Oral - Nectar Cup -- Oral - Nectar Straw -- Oral - Thin Teaspoon -- Oral - Thin Cup -- Oral - Thin Straw -- Oral - Puree -- Oral - Mech Soft -- Oral - Regular -- Oral - Multi-Consistency -- Oral - Pill -- Oral Phase - Comment --  CHL IP PHARYNGEAL PHASE 12/27/2016 Pharyngeal Phase Impaired Pharyngeal- Pudding Teaspoon -- Pharyngeal -- Pharyngeal- Pudding Cup -- Pharyngeal -- Pharyngeal- Honey Teaspoon Delayed swallow initiation-vallecula;Pharyngeal residue - valleculae;Pharyngeal residue - pyriform;Reduced tongue base retraction;Reduced laryngeal elevation;Penetration/Apiration after swallow Pharyngeal Material enters airway, passes BELOW cords without attempt by patient to eject out (silent aspiration);Material enters airway, remains ABOVE vocal cords and not ejected out Pharyngeal- Honey Cup -- Pharyngeal -- Pharyngeal- Nectar Teaspoon Delayed swallow initiation-vallecula;Penetration/Aspiration during swallow;Pharyngeal residue -  valleculae;Pharyngeal residue - pyriform;Reduced tongue base retraction;Reduced laryngeal elevation Pharyngeal Material enters airway, passes BELOW cords without attempt by patient to eject out (silent aspiration) Pharyngeal- Nectar Cup -- Pharyngeal -- Pharyngeal- Nectar Straw -- Pharyngeal -- Pharyngeal- Thin Teaspoon -- Pharyngeal -- Pharyngeal- Thin Cup -- Pharyngeal -- Pharyngeal- Thin Straw -- Pharyngeal -- Pharyngeal- Puree Delayed swallow initiation-vallecula Pharyngeal -- Pharyngeal- Mechanical Soft -- Pharyngeal -- Pharyngeal- Regular -- Pharyngeal -- Pharyngeal- Multi-consistency -- Pharyngeal -- Pharyngeal- Pill -- Pharyngeal -- Pharyngeal Comment --  CHL IP CERVICAL ESOPHAGEAL PHASE 12/27/2016 Cervical Esophageal Phase WFL Pudding Teaspoon -- Pudding Cup -- Honey Teaspoon -- Honey Cup -- Nectar Teaspoon -- Nectar Cup -- Nectar Straw -- Thin Teaspoon -- Thin Cup -- Thin Straw -- Puree -- Mechanical Soft -- Regular -- Multi-consistency -- Pill -- Cervical Esophageal Comment -- No flowsheet data found. [place] Siren 12/27/2016, 10:44 AM Orbie Pyo Colvin Caroli.Ed CCC-SLP Pager [phone]               Assessment/Plan There are no diagnoses linked to this encounter.

## 2017-01-25 ENCOUNTER — Encounter: Payer: Self-pay | Admitting: Internal Medicine

## 2017-01-25 ENCOUNTER — Non-Acute Institutional Stay (SKILLED_NURSING_FACILITY): Payer: Medicaid Other | Admitting: Internal Medicine

## 2017-01-25 DIAGNOSIS — G8220 Paraplegia, unspecified: Secondary | ICD-10-CM

## 2017-01-25 DIAGNOSIS — E871 Hypo-osmolality and hyponatremia: Secondary | ICD-10-CM

## 2017-01-25 DIAGNOSIS — R52 Pain, unspecified: Secondary | ICD-10-CM

## 2017-01-25 DIAGNOSIS — G373 Acute transverse myelitis in demyelinating disease of central nervous system: Secondary | ICD-10-CM | POA: Diagnosis not present

## 2017-01-25 NOTE — Progress Notes (Signed)
Location:   [facility] Room Number: 151/P Place of Service:  SNF (31) Provider:  Granville Lewis  No PCP Per Patient  Patient Care Team: No Pcp Per Patient as PCP - General (General Practice)  Extended Emergency Contact Information Primary Emergency Contact: ?,[facility] Address: [address]          [place], [place] [address] Johnnette Litter of [place] Phone: [phone] Relation: None Secondary Emergency Contact: Herbert [place] Phone: [phone] Mobile Phone: [phone] Relation: Mother  Code Status:  DNR Goals of care: Advanced Directive information Advanced Directives 01/25/2017  Does Patient Have a Medical Advance Directive? Yes  Type of Advance Directive Out of facility DNR (pink MOST or yellow form)  Does patient want to make changes to medical advance directive? No - Patient declined  Would patient like information on creating a medical advance directive? No - Patient declined     Chief Complaint  Patient presents with  . Acute Visit   Secondary to pain management HPI:  Pt is a 51 y.o. male seen today for an acute visit for pain management.  this a 51 year old male with a complicated history including HIV-substance abuse-medical noncompliance-.  He presented to hospital with septic shock and altered mental status due to bacterial meningitis.  Require intubation and was started on broad-spectrum robotics as well as acyclovir.  Lumbar puncture showed a white count high with predominance of eosinophils--question parasitic intervention.  MRI shows small infarcts in the cerebellum and cortex due to vasospasm or embolic disease.  When she is just the vasculopathy was suggested by neurology but pattern on MRI did not really demonstrate this.  MRA suggested fibromuscular dysplasia.  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  He also completed course for ESBL Klebsiella.  He is on Bactrim for PCP prophylaxis.  Clinically he appears to continue to improve  somewhat during his stay here he is working with therapy  He is finishing treatment for Escherichia coli ESBL with Macrobid-he does have an indwelling Foley catheter secondary to urinary retention issues.  He is not complaining of fever chills vital signs appear to be stable at times he will have a slightly elevated temperature in the 99's  We did do a CBC last week and white count was within normal range.   His main complaint continues to be pain-he is on extensive medication including Duragesic patch 25 g-oxycodone 5 mg every 4 hours as needed.  He says the oxycodone helps a bit but is not strong enough He is also on Neurontin 600 mg 3 times a day.  He also continues on Klonopin 1 mg 3 times a day when necessary  He says the pain is mainly from his hip down to his legs this has been somewhat chronic he does have a history of paralysis with history of meningitis-- transverse myelitis  Last week there was some hesitancyto increasepain medication secondary to his numerous medications which could be sedating --however nursing staff says this week the pain appears to be somewhat increased and feel he would benefit from stronger pain control.  This is complicated with the previous history of substance abuse    Past Medical History:  Diagnosis Date  . COPD (chronic obstructive pulmonary disease) ([place])   . Genital warts   . HIV (human immunodeficiency virus infection) ([place])   . Osteoporosis  Past Surgical History:  Procedure Laterality Date  . PROSTATE SURGERY  11/17/2016    Allergies  Allergen Reactions  . Iodine   . Ketorolac     unknown  . Tylenol [Acetaminophen]     unknown    Current Facility-Administered Medications on File Prior to Visit  Medication Dose Route Frequency  Provider Last Rate Last Dose  . chlorhexidine gluconate (MEDLINE KIT) (PERIDEX) 0.12 % solution 15 mL  15 mL Mouth Rinse BID Greenwood, MD      . MEDLINE mouth rinse  15 mL Mouth Rinse 10 times per day Rush Landmark, MD       Current Outpatient Prescriptions on File Prior to Visit  Medication Sig Dispense Refill  . aspirin 325 MG tablet Take 1 tablet (325 mg total) by mouth daily. 30 tablet 0  . Balsam [place]-Castor Oil (VENELEX) OINT Apply to left buttocks and sacrum q shift & prn    . clonazePAM (KLONOPIN) 1 MG tablet Take 1 tablet (1 mg total) by mouth 3 (three) times daily as needed for anxiety. 12 tablet 0  . darunavir-cobicistat (PREZCOBIX) 800-150 MG tablet Take 1 tablet by mouth daily with breakfast. Swallow whole. Do NOT crush, break or chew tablets. Take with food. 30 tablet 0  . diphenhydrAMINE (SOMINEX) 25 MG tablet Take 25 mg by mouth every 6 (six) hours as needed for itching.    . docusate (COLACE) 50 MG/5ML liquid Take 10 mLs (100 mg total) by mouth 2 (two) times daily as needed for mild constipation. 100 mL 0  . dolutegravir (TIVICAY) 50 MG tablet Take 1 tablet (50 mg total) by mouth daily. 30 tablet 1  . emtricitabine-tenofovir AF (DESCOVY) 200-25 MG tablet Take 1 tablet by mouth daily. 30 tablet 0  . fentaNYL (DURAGESIC - DOSED MCG/HR) 25 MCG/HR patch Place 1 patch (25 mcg total) onto the skin every 3 (three) days. 10 patch 0  . fluconazole (DIFLUCAN) 200 MG tablet Take 1 tablet (200 mg total) by mouth daily. 30 tablet 0  . gabapentin (NEURONTIN) 600 MG tablet Take 600 mg by mouth 3 (three) times daily.    Marland Kitchen guaiFENesin-dextromethorphan (ROBITUSSIN DM) 100-10 MG/5ML syrup Take 5 mLs by mouth every 4 (four) hours as needed for cough. 118 mL 0  . insulin aspart (NOVOLOG) 100 UNIT/ML injection CBG < 70: implement hypoglycemia protocol  CBG 70 - 120: 0 units  CBG 121 - 150: 2 units  CBG 151 - 200: 3 units  CBG 201 - 250: 5 units  CBG 251 - 300: 7 units  CBG 301  - 350: 10 units  CBG 351 - 400: 12 units  CBG > 400 call MD and obtain STAT lab verification 10 mL 11  . ipratropium-albuterol (DUONEB) 0.5-2.5 (3) MG/3ML SOLN Take 3 mLs by nebulization every 4 (four) hours as needed (shortness of breathe).    Marland Kitchen oxyCODONE (OXY IR/ROXICODONE) 5 MG immediate release tablet Take 1 tablet (5 mg total) by mouth every 4 (four) hours as needed for severe pain. 180 tablet 0  . pantoprazole sodium (PROTONIX) 40 mg/20 mL PACK Take 20 mLs (40 mg total) by mouth daily. 30 each 1  . polyvinyl alcohol (LIQUIFILM TEARS) 1.4 % ophthalmic solution Place 1 drop into both eyes daily as needed for dry eyes.    Marland Kitchen sulfamethoxazole-trimethoprim (BACTRIM,SEPTRA) 200-40 MG/5ML suspension Take 20 mLs by mouth daily. 100 mL 2     Review of Systems General is not complaining any  fever or chills.  Respiratory does not complain of  shortness breath or coughing.  Cardiac is not complaining of any chest pain.  GI is not really complaining of abdominal pain nursing staff has not reported any issues with diarrhea constipation nausea or vomiting  GU does not complain of overt dysuria again has had his history of urinary retention.Has an indwelling Foley catheter has completed a course of Macrobid for UTI  Muscle skeletal continues to complain at times of pain basically from his hip down to his legs  Neurologic is not complaining of dizziness or headache.  Psych --at times continues to have noncompliance-but apparently has been more cooperative recently does not complain of overt anxiety or depression      There is no immunization history on file for this patient. Pertinent  Health Maintenance Due  Topic Date Due  . FOOT EXAM  12/06/1975  . OPHTHALMOLOGY EXAM  12/06/1975  . URINE MICROALBUMIN  12/06/1975  . COLONOSCOPY  12/06/2015  . INFLUENZA VACCINE  10/08/2017 (Originally 06/08/2016)  . HEMOGLOBIN A1C  06/02/2017   No flowsheet data found. Functional Status  Survey:      Physical Exam  In general this is a frail appearing middle-aged male in no acute distress appears to be lying  comfortably in bed.--But does appear to have pain with any attempted movement of his lower extremities  Head ears eyes nose mouth and throat --- visual acuity appears grossly intact.  Chest is clear to auscultation there is no labored breathing.--Respiratory effort is somewhat poor  Heart is regular rate and rhythm without murmur gallop or rub.  Abdomen is soft does not appear to betender to palpation---there  are positive bowel sounds.  GU could not really appreciate suprapubic distention he does have a Foley catheter placed with  amber colored urine.  Musculoskeletal does have general frailty with significant bilateral lower extremity paralysis-weakness-he does have diffuse muscle wasting. --there is some tenderness to palpation of both legs-per nursing staff he does complain of this quite a bit  Neurologic he is alert cranial nerves appear grossly intact speech is clear   Psych he is alert and oriented was cooperative with exam  Labs reviewed:  Recent Labs  12/12/16 0319 12/13/16 0219 12/15/16 0439  12/19/16 0910  01/02/17 0600 01/07/17 0700 01/20/17 0700  NA 133* 136 138  < > 149*  < > 136 133* 133*  K 5.2* 4.1 3.5  < > 3.5  < > 4.3 4.6 4.4  CL 90* 97* 97*  < > 113*  < > 97* 95* 96*  CO2 34* 35* 30  < > 27  < > 28 30 29   GLUCOSE 245* 223* 219*  < > 217*  < > 72 140* 103*  BUN 23* 24* 28*  < > 51*  < > 20 23* 16  CREATININE 0.83 0.82 0.79  < > 1.16  < > 1.31* 1.31* 1.25*  CALCIUM 8.1* 8.4* 9.1  < > 9.3  < > 8.5* 8.7* 9.2  MG 1.7 2.0  --   --  2.5*  --   --   --   --   PHOS 3.2 2.6 3.8  --   --   --   --   --   --   < > = values in this interval not displayed.  Recent Labs  01/02/17 0600 01/07/17 0700 01/20/17 0700  AST 47* 34 30  ALT 62 53 32  ALKPHOS 81 61 51  BILITOT 0.6  0.5 0.4  PROT 6.6 7.2 7.6  ALBUMIN 2.0* 2.4* 2.4*     Recent Labs  12/02/16 0009  01/02/17 0600 01/07/17 0700 01/20/17 0700  WBC 20.1*  < > 7.9 8.7 8.4  NEUTROABS 18.7*  --   --  6.4 4.3  HGB 9.0*  < > 11.5* 11.5* 10.9*  HCT 28.2*  < > 35.7* 34.9* 33.9*  MCV 97.2  < > 94.4 93.8 90.9  PLT 229  < > 203 203 380  < > = values in this interval not displayed. Lab Results  Component Value Date   TSH 0.982 12/19/2016   Lab Results  Component Value Date   HGBA1C 6.6 (H) 12/03/2016   Lab Results  Component Value Date   CHOL 141 12/09/2016   HDL 16 (L) 12/09/2016   LDLCALC 93 12/09/2016   TRIG 217 (H) 12/12/2016   CHOLHDL 8.8 12/09/2016    Significant Diagnostic Results in last 30 days:  Dg Chest 2 View  Result Date: 01/07/2017 CLINICAL DATA:  Recurrent pneumonia, followup exam EXAM: CHEST  2 VIEW COMPARISON:  12/26/2016 FINDINGS: Cardiac shadow is stable. Previously seen infiltrate in the left lung has improved significantly although residual is noted in the lower lobe. No effusion is seen. No new focal infiltrate is noted. No bony abnormality is seen. IMPRESSION: Improved but persistent left lower lobe pneumonia. Continued follow-up is recommended. Electronically Signed   By: Inez Catalina M.D.   On: 01/07/2017 12:51   Dg Op Swallowing Func-medicare/speech Path  Result Date: 01/18/2017 [place], [place], [address] Phone: [phone]   Fax:  [phone] Modified Barium Swallow Patient Details Name: Lawrence Kane MRN: [id number] Date of Birth: April 20, 1966 No Data Recorded Encounter Date: 01/17/2017   End of Session - 01/17/17 1942   Visit Number 1  Number of Visits 1  Authorization Type Medicaid  SLP Start Time 1440  SLP Stop Time  1513  SLP Time Calculation (min) 33 min  Activity Tolerance Patient tolerated treatment well  Past Medical History: Diagnosis Date . COPD (chronic obstructive pulmonary disease) ([place])  . Genital warts  . HIV (human immunodeficiency virus infection)  ([place])  . Osteoporosis  Past Surgical History: Procedure Laterality Date . PROSTATE SURGERY  11/17/2016 There were no vitals filed for this visit.   Subjective Assessment - 01/17/17 1936   Subjective Pt seen upright in [place] chair for MBSS  Special Tests MBSS  Currently in Pain? No/denies    General - 01/17/17 [place]    General Information  HPI Aime Kampe is a 51 year old man who has a history of HIV, substance abuse, medical noncompliance. He was referred for MBSS by Dr. Linton Ham. Pt with previous MBSS at [facility] in February with recommendation for pudding thick liquids and puree. He presented to hospital with septic shock and altered mental status due to bacterial meningitis. He was intubated and started on broad-spectrum antibiotics as well as acyclovir for scrotal lesions. He will underwent a lumbar punctures that showed a white count high with predominance of eosinophils question parasitic infection. Cytology negative for malignant cells or parasites. MRI was done that showed small infarcts in the cerebellum and cortex due to vasospasm or embolic disease. 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 MRAsuggested fibromuscular dysplasia. His infarcts were felt to be secondary to vasculitis and vasospasm secondary to meningoencephalitis. MRI of total spine was negative for abscess hematoma. 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 He was treated for a complicated UTI ESBL Klebsiella and a tracheostomy placed on 2/5 although that since been removed.. The patient completed a 21 day course of acyclovir. Now he is on Bactrim for PCP prophylaxis. Pt was at [facility] from 12/01/16 through 01/04/17.  Type of Study MBS-Modified Barium Swallow Study  Previous Swallow Assessment MBS 12/27/2016 and 12/30/2016  Diet Prior to this Study Dysphagia 1 (puree);Pudding-thick liquids  Temperature Spikes Noted No  Respiratory Status Room air   Behavior/Cognition Alert;Cooperative;Pleasant mood  Oral Cavity Assessment Within Functional Limits  Oral Care Completed by SLP No  Oral Cavity - Dentition Adequate natural dentition  Vision Functional for self feeding  Self-Feeding Abilities Able to feed self  Patient Positioning Upright in chair  Baseline Vocal Quality Low vocal intensity  Volitional Cough Strong  Volitional Swallow Able to elicit  Anatomy Within functional limits  Pharyngeal Secretions Not observed secondary MBS    Oral Preparation/Oral Phase - 01/17/17 1938    Oral Preparation/Oral Phase  Oral Phase Impaired   Electrical stimulation - Oral Phase  Was Electrical Stimulation Used No    Pharyngeal Phase - 01/17/17 1939    Pharyngeal Phase  Pharyngeal Phase Impaired   Pharyngeal - Nectar  Pharyngeal- Nectar Cup Swallow initiation at pyriform sinus;Reduced tongue base retraction;Reduced epiglottic inversion;Pharyngeal residue - valleculae;Pharyngeal residue - pyriform trace/mild residuals; repeat swallow effective   Pharyngeal - Thin  Pharyngeal- Thin Teaspoon Swallow initiation at vallecula;Reduced epiglottic inversion;Reduced tongue base retraction Pt swallowed 3x for one tsp  Pharyngeal- Thin Cup Swallow initiation at vallecula;Swallow initiation at pyriform sinus;Reduced epiglottic inversion;Reduced tongue base retraction;Penetration/Aspiration during swallow;Pharyngeal residue - valleculae;Pharyngeal residue - pyriform;Pharyngeal residue - cp segment  Pharyngeal Material does not enter airway;Material enters airway, remains ABOVE vocal cords then ejected out  Pharyngeal- Thin Straw Swallow initiation at pyriform sinus;Reduced epiglottic inversion;Reduced tongue base retraction;Penetration/Aspiration during swallow;Pharyngeal residue - cp segment  Pharyngeal Material does not enter airway;Material enters airway, remains ABOVE vocal cords then ejected out   Pharyngeal - Solids  Pharyngeal- Puree Swallow initiation at vallecula;Reduced epiglottic  inversion;Reduced tongue base retraction;Pharyngeal residue - valleculae;Pharyngeal residue - pyriform;Pharyngeal residue - cp segment  Pharyngeal- Mechanical Soft Swallow initiation at vallecula;Reduced epiglottic inversion;Reduced tongue base retraction;Pharyngeal residue - valleculae  Pharyngeal- Pill Swallow initiation at vallecula;Reduced epiglottic inversion;Reduced tongue base retraction   Pharyngeal Phase - Comment  Pharyngeal Comment Pt noted to have trace, silent aspiration of thins when taking mixed consistencies (pill and thin; thin and crackers); cued cough was effective in clearing   Electrical Stimulation - Pharyngeal Phase  Was Electrical Stimulation Used No    Cricopharyngeal Phase - 01/17/17 1940    Cervical Esophageal Phase  Cervical Esophageal Phase Impaired   Cervical Esophageal Phase - Thin  Thin Cup Reduced cricopharyngeal relaxation    Plan - 01/17/17 1943   Clinical Impression Statement Pt assessed in the lateral position with barium tinged thin, NTL, puree, mixed consistencies, regular textures, and barium tablet. Pt presents with mild oral phase dysphagia with delayed oral transit and piecemeal deglutition; mild/moderate pharyngeal phase dysphagia characterized by swallow trigger at the level of the valleculae for small sips thin, puree, and solids; swallow trigger at the level of the pyriforms with larger sips thin. Pt with decreased tongue base retraction and epiglottic deflection resulting in flash penetration of thins during the swallow and mild vallecular, pyriform, and UES residuals. Repeat swallows are effective in reducing residuals and pt tends to implement  independently. Pt noted to have more difficulty swallowing barium tablet with thin (premature spillage of thins and stasis in oral cavity with pill which eventually moved to valleculae and eventually cleared with sequential cup sips thin) and with mixed consistencies (Pt chewing crackers and asked to take a sip of liquid).  Although unwitnessed, Pt aspirated trace amount of thins silently as it was noted along anterior tracheal wall post swallow. SLP cued pt to cough and he was successful in clearing aspirated material. Recommend D3/mech soft (due to suspected impulsivity but ok to clinically upgrade per treating SLP) and thin liquids, no straws, avoid mixed consistencies and cue Pt to cough periodically after thins. Treating SLP may decide to continue with NTL during meals for safety and advance as appropriate. PO medications whole in puree or crushed. Continued dysphagia therapy may be indicated to focus on increasing breath support, vocal fold closure, tongue base retraction.   Treatment/Interventions Aspiration precaution training;SLP instruction and feedback;Compensatory strategies;Patient/family education;Diet toleration management by SLP;Pharyngeal strengthening exercises  Potential to Achieve Goals Fair  Potential Considerations Ability to learn/carryover information  Consulted and Agree with Plan of Care Patient  Patient will benefit from skilled therapeutic intervention in order to improve the following deficits and impairments:  Dysphagia, oropharyngeal phase   Recommendations/Treatment - 01/17/17 1941    Swallow Evaluation Recommendations  SLP Diet Recommendations Dysphagia 3 (mechanical soft);Thin  Liquid Administration via Cup  Medication Administration Whole meds with puree  Supervision Patient able to self feed  Compensations Slow rate;Small sips/bites;Multiple dry swallows after each bite/sip  Postural Changes Seated upright at 90 degrees;Remain upright for at least 30 minutes after feeds/meals    Prognosis - 01/17/17 1941    Prognosis  Prognosis for Safe Diet Advancement Fair  Barriers to Reach Goals Cognitive deficits   Individuals Consulted  Consulted and Agree with Results and Recommendations Patient  Report Sent to  Referring physician;Facility (Comment)  Problem List Patient Active Problem List  Diagnosis Date  Noted . Dysphagia  . Adjustment disorder with mixed anxiety and depressed mood 12/30/2016 . Goals of care, counseling/discussion  . Palliative care encounter  . Respiratory distress  . Agitation  . Hypernatremia  . Leukocytosis  . Acute blood loss anemia  . Pain  . Acute respiratory failure with hypoxia ([place])  . Tracheostomy status ([place])  . Hypoxemia  . Muscle weakness (generalized)  . Ileus ([place])  . Abdominal distention  . Transverse myelitis ([place])  . Encounter for orogastric (OG) tube placement  . Somnolence  . Paraplegia ([place])  . Acute pulmonary edema ([facility])  . Neurosyphilis  . Herpesviral meningitis  . Cerebral embolism with cerebral infarction 12/08/2016 . Endotracheally intubated  . Meningoencephalitis  . Acute encephalopathy  . Acute respiratory failure ([place]) 12/02/2016 . Altered mental status  . COPD (chronic obstructive pulmonary disease) ([place]) 12/01/2016 . Substance abuse 12/01/2016 . Genital warts 12/01/2016 . HIV (human immunodeficiency virus infection) ([place]) 12/01/2016 . AIDS (acquired immune deficiency syndrome) ([place]) 12/01/2016 . Noncompliance 12/01/2016 . Bacterial meningitis 12/01/2016 . Hypertensive urgency 12/01/2016 . Septic shock ([place]) 12/01/2016 . Transaminitis 12/01/2016 . Cocaine abuse 12/01/2016 . Open wound 12/01/2016 Thank you, Genene Churn, [place] [facility] 01/17/2017, 8:39 PM [place], [place], [address] Phone: [phone]   Fax:  [phone] Name: Lawrence Kane MRN: [id number] Date of Birth: 1966-10-13 CLINICAL DATA:  HIV, suspected transverse myelitis, dysphagia, history COPD EXAM: MODIFIED BARIUM SWALLOW TECHNIQUE: Different consistencies of barium were administered orally to the  patient by the Speech Pathologist. Imaging of the pharynx was performed in the lateral projection. FLUOROSCOPY TIME:  Fluoroscopy Time:  3 minutes 18 seconds Radiation Exposure Index (if provided by the fluoroscopic device):  22.9 mGy Number of Acquired Spot Images: Screen capture of cine series during fluoroscopy COMPARISON:  None. FINDINGS: Thin liquid- spillover of contrast to the piriform sinuses. Flash laryngeal penetration with cup presentation. No aspiration. Mild vallecular and minimal piriform sinus residuals. Flash laryngeal penetration was seen on the second of 3 sequential swallows by straw presentation. Nectar thick liquid- mild spillover of contrast to the piriform sinuses. Vallecular residual. No laryngeal penetration or aspiration. Honey- not evaluated Pure- normal oropharyngeal motion. No laryngeal penetration or aspiration. Vallecular and minimal piriform sinus residuals. Cracker-no laryngeal penetration or aspiration. Vallecular and piriform sinus residuals noted. Pure with cracker- no laryngeal penetration or aspiration. Vallecular and piriform sinuses residuals noted. Barium tablet - patient swallow a 12.5 mm diameter barium tablet, which transiently delayed in the vallecula cells clear by repeat swallow. Small amount of thin barium contrast was identified in the proximal trachea following the barium tablet likely representing unwitnessed aspiration of thin barium. This was cleared with a voluntary cough. IMPRESSION: Swallowing dysfunction as above. Please refer to the Speech Pathologists report for complete details and recommendations. Electronically Signed   By: Lavonia Dana M.D.   On: 01/17/2017 15:28   Dg Swallowing Func-speech Pathology  Result Date: 12/30/2016 Objective Swallowing Evaluation: Type of Study: MBS-Modified Barium Swallow Study Patient Details Name: Lawrence Kane MRN: [id number] Date of Birth: [birth year] Today's Date: 12/30/2016 Time: SLP Start Time (ACUTE ONLY): 1306-SLP Stop Time (ACUTE ONLY): 1331 SLP Time Calculation (min) (ACUTE ONLY): 25 min Past Medical History: Past Medical History: Diagnosis Date . COPD (chronic obstructive pulmonary disease) ([place])  . Genital warts  . HIV (human  immunodeficiency virus infection) ([place])  . Osteoporosis  Past Surgical History: Past Surgical History: Procedure Laterality Date . PROSTATE SURGERY  11/17/2016 HPI: 51 year old man with HIV/AIDS (first diagnosed 2003, COPD, and cocaine substance abuse, [place] ([place] who presented to [facility] on 1/24 with complaint of altered mental status. Found to be hypertensive to 240/112. 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 MBS 2/19 recommended Dys 1, pudding thick liquids.  No Data Recorded Assessment / Plan / Recommendation CHL IP CLINICAL IMPRESSIONS 12/30/2016 Clinical Impression Swallow function mildy decreased from prior 2/19 with increased motor impairments observed. 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 Mr. Matthis exhibited a mod-severe pharyngeal dysphagia characterized by both silent aspiration and penetration with nectar and honey thick liquids, which was present with several trials. Verbal cues to cough, throat clear, and second swallows did not effectively remove the aspirated/penetrated barium or residue. Delayed swallow initiation to valleculae and pyriform sinuses noted throughout the study due to decreased sensation. Significantly increased vallecular and pyriform sinus residue compared to prior MBS (12/27/16) due to reduced tongue base retraction and laryngeal elevation; impaired cervical esophageal relaxation resulted in barium stasis above and below the UES and slow transit (not observed during 2/19 MBS). Educated pt re: severity of swallow impairments and the high aspiration risk associated primarily with consistencies thinner than pudding thick. Pt was adamant about consuming a regular diet and thin liquids despite SLP education. Recommend Dys 1 (pureed solids), pudding thick liquids via spoon, and meds crushed. MD  notified who stated  she will speak with pt and pt's mother. ST will f/u. SLP Visit Diagnosis Dysphagia, pharyngeal phase (R13.13);Dysphagia, pharyngoesophageal phase (R13.14) Attention and concentration deficit following -- Frontal lobe and executive function deficit following -- Impact on safety and function Severe aspiration risk   CHL IP TREATMENT RECOMMENDATION 12/30/2016 Treatment Recommendations Therapy as outlined in treatment plan below   Prognosis 12/30/2016 Prognosis for Safe Diet Advancement Fair Barriers to Reach Goals Severity of deficits Barriers/Prognosis Comment -- CHL IP DIET RECOMMENDATION 12/30/2016 SLP Diet Recommendations Pudding thick liquid;Dysphagia 1 (Puree) solids Liquid Administration via Spoon;No straw Medication Administration Crushed with puree Compensations Slow rate;Small sips/bites Postural Changes Seated upright at 90 degrees   CHL IP OTHER RECOMMENDATIONS 12/30/2016 Recommended Consults -- Oral Care Recommendations Oral care BID Other Recommendations Order thickener from pharmacy   CHL IP FOLLOW UP RECOMMENDATIONS 12/30/2016 Follow up Recommendations Other (comment)   CHL IP FREQUENCY AND DURATION 12/30/2016 Speech Therapy Frequency (ACUTE ONLY) min 2x/week Treatment Duration 2 weeks      CHL IP ORAL PHASE 12/30/2016 Oral Phase WFL Oral - Pudding Teaspoon -- Oral - Pudding Cup -- Oral - Honey Teaspoon -- Oral - Honey Cup WFL Oral - Nectar Teaspoon -- Oral - Nectar Cup WFL Oral - Nectar Straw -- Oral - Thin Teaspoon -- Oral - Thin Cup -- Oral - Thin Straw -- Oral - Puree WFL Oral - Mech Soft -- Oral - Regular -- Oral - Multi-Consistency -- Oral - Pill -- Oral Phase - Comment --  CHL IP PHARYNGEAL PHASE 12/30/2016 Pharyngeal Phase Impaired Pharyngeal- Pudding Teaspoon -- Pharyngeal -- Pharyngeal- Pudding Cup -- Pharyngeal -- Pharyngeal- Honey Teaspoon NT Pharyngeal -- Pharyngeal- Honey Cup Delayed swallow initiation-vallecula;Delayed swallow initiation-pyriform sinuses;Penetration/Aspiration  during swallow;Pharyngeal residue - valleculae;Pharyngeal residue - pyriform;Pharyngeal residue - cp segment;Reduced tongue base retraction;Reduced laryngeal elevation Pharyngeal Material enters airway, remains ABOVE vocal cords then ejected out;Material enters airway, passes BELOW cords without attempt by patient to eject out (silent aspiration) Pharyngeal- Nectar Teaspoon NT Pharyngeal -- Pharyngeal- Nectar Cup Delayed swallow initiation-vallecula;Delayed swallow initiation-pyriform sinuses;Penetration/Aspiration during swallow;Pharyngeal residue - valleculae;Pharyngeal residue - pyriform;Pharyngeal residue - cp segment;Reduced tongue base retraction Pharyngeal Material enters airway, passes BELOW cords without attempt by patient to eject out (silent aspiration) Pharyngeal- Nectar Straw -- Pharyngeal -- Pharyngeal- Thin Teaspoon -- Pharyngeal -- Pharyngeal- Thin Cup -- Pharyngeal -- Pharyngeal- Thin Straw -- Pharyngeal -- Pharyngeal- Puree Pharyngeal residue - valleculae;Pharyngeal residue - pyriform;Reduced tongue base retraction;Reduced laryngeal elevation Pharyngeal -- Pharyngeal- Mechanical Soft -- Pharyngeal -- Pharyngeal- Regular -- Pharyngeal -- Pharyngeal- Multi-consistency -- Pharyngeal -- Pharyngeal- Pill -- Pharyngeal -- Pharyngeal Comment --  CHL IP CERVICAL ESOPHAGEAL PHASE 12/30/2016 Cervical Esophageal Phase Impaired Pudding Teaspoon -- Pudding Cup -- Honey Teaspoon -- Honey Cup Reduced cricopharyngeal relaxation Nectar Teaspoon -- Nectar Cup Reduced cricopharyngeal relaxation Nectar Straw -- Thin Teaspoon -- Thin Cup -- Thin Straw -- Puree Reduced cricopharyngeal relaxation Mechanical Soft -- Regular -- Multi-consistency -- Pill -- Cervical Esophageal Comment -- No flowsheet data found. [place] Siren 12/30/2016, 3:15 PM  Orbie Pyo Colvin Caroli.Ed CCC-SLP Pager [phone]             Dg Swallowing Func-speech Pathology  Result Date: 12/27/2016 Objective Swallowing Evaluation: Type of  Study: MBS-Modified Barium Swallow Study Patient Details Name: Lawrence Kane MRN: [id number] Date of Birth: 1966-04-24 Today's Date: 12/27/2016 Time: SLP Start Time (ACUTE ONLY): 0930-SLP Stop Time (ACUTE ONLY): 0950 SLP Time Calculation (min) (ACUTE ONLY): 20 min Past Medical History: Past Medical History: Diagnosis  Date . COPD (chronic obstructive pulmonary disease) ([place])  . Genital warts  . HIV (human immunodeficiency virus infection) ([place])  . Osteoporosis  Past Surgical History: Past Surgical History: Procedure Laterality Date . PROSTATE SURGERY  11/17/2016 HPI: 51 year old man with HIV/AIDS (first diagnosed 2003, COPD, and cocaine substance abuse, recent [place] in [place] (?) who presented to [facility] on 1/24 with complaint of altered mental status. Found to be hypertensive to 240/112. 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 No Data Recorded Assessment / Plan / Recommendation CHL IP CLINICAL IMPRESSIONS 12/27/2016 Clinical Impression MBS completed wearing Passy-Muir speaking valve. Pharyngeal impairments primarily originate from decreased sensation with minimal motor deficits. Valleculae was site of swallow initiation for most trials. Pyriform sinsus appear shallow and small with min-mild residue silently aspirated after the swallow. Larygneal penetration consistent during the swallow with honey thick barium without awareness. Recommend Dys 1 (puree) only and pudding thick liquids (nothing thinner than puree/pudding), donn speaking valve with all meals/meds, slow rate, small bites.   SLP Visit Diagnosis Dysphagia, pharyngeal phase (R13.13) Attention and concentration deficit following -- Frontal lobe and executive function deficit following -- Impact on safety and function Moderate aspiration risk   CHL IP TREATMENT RECOMMENDATION 12/27/2016 Treatment  Recommendations Therapy as outlined in treatment plan below   Prognosis 12/27/2016 Prognosis for Safe Diet Advancement Good Barriers to Reach Goals -- Barriers/Prognosis Comment -- CHL IP DIET RECOMMENDATION 12/27/2016 SLP Diet Recommendations Dysphagia 1 (Puree) solids;Pudding thick liquid Liquid Administration via Spoon Medication Administration Crushed with puree Compensations Slow rate;Small sips/bites Postural Changes Seated upright at 90 degrees   CHL IP OTHER RECOMMENDATIONS 12/27/2016 Recommended Consults -- Oral Care Recommendations Oral care BID Other Recommendations Order thickener from pharmacy   CHL IP FOLLOW UP RECOMMENDATIONS 12/27/2016 Follow up Recommendations (No Data)   CHL IP FREQUENCY AND DURATION 12/27/2016 Speech Therapy Frequency (ACUTE ONLY) min 2x/week Treatment Duration 2 weeks      CHL IP ORAL PHASE 12/27/2016 Oral Phase WFL Oral - Pudding Teaspoon -- Oral - Pudding Cup -- Oral - Honey Teaspoon -- Oral - Honey Cup -- Oral - Nectar Teaspoon -- Oral - Nectar Cup -- Oral - Nectar Straw -- Oral - Thin Teaspoon -- Oral - Thin Cup -- Oral - Thin Straw -- Oral - Puree -- Oral - Mech Soft -- Oral - Regular -- Oral - Multi-Consistency -- Oral - Pill -- Oral Phase - Comment --  CHL IP PHARYNGEAL PHASE 12/27/2016 Pharyngeal Phase Impaired Pharyngeal- Pudding Teaspoon -- Pharyngeal -- Pharyngeal- Pudding Cup -- Pharyngeal -- Pharyngeal- Honey Teaspoon Delayed swallow initiation-vallecula;Pharyngeal residue - valleculae;Pharyngeal residue - pyriform;Reduced tongue base retraction;Reduced laryngeal elevation;Penetration/Apiration after swallow Pharyngeal Material enters airway, passes BELOW cords without attempt by patient to eject out (silent aspiration);Material enters airway, remains ABOVE vocal cords and not ejected out Pharyngeal- Honey Cup -- Pharyngeal -- Pharyngeal- Nectar Teaspoon Delayed swallow initiation-vallecula;Penetration/Aspiration during swallow;Pharyngeal residue - valleculae;Pharyngeal  residue - pyriform;Reduced tongue base retraction;Reduced laryngeal elevation Pharyngeal Material enters airway, passes BELOW cords without attempt by patient to eject out (silent aspiration) Pharyngeal- Nectar Cup -- Pharyngeal -- Pharyngeal- Nectar Straw -- Pharyngeal -- Pharyngeal- Thin Teaspoon -- Pharyngeal -- Pharyngeal- Thin Cup -- Pharyngeal -- Pharyngeal- Thin Straw -- Pharyngeal -- Pharyngeal- Puree Delayed swallow initiation-vallecula Pharyngeal -- Pharyngeal- Mechanical Soft -- Pharyngeal -- Pharyngeal- Regular -- Pharyngeal -- Pharyngeal- Multi-consistency -- Pharyngeal -- Pharyngeal- Pill -- Pharyngeal -- Pharyngeal  Comment --  CHL IP CERVICAL ESOPHAGEAL PHASE 12/27/2016 Cervical Esophageal Phase WFL Pudding Teaspoon -- Pudding Cup -- Honey Teaspoon -- Honey Cup -- Nectar Teaspoon -- Nectar Cup -- Nectar Straw -- Thin Teaspoon -- Thin Cup -- Thin Straw -- Puree -- Mechanical Soft -- Regular -- Multi-consistency -- Pill -- Cervical Esophageal Comment -- No flowsheet data found. [place] Siren 12/27/2016, 10:44 AM Orbie Pyo Colvin Caroli.Ed CCC-SLP Pager [phone]               Assessment/Plan  #1 pain management-this continues to be a challenging situation with patient's complicated history as noted above.  I discussed this with nursing as well as with Dr. Leighton Parody this point will cautiously increase his when necessary oxycodone up to 10 mg every 4 hours for severe pain continue the Duragesic patch at 25 g again he is on Neurontin 600 mg 3 times a day as well.--We'll need to be monitored closely again for over sedation.  Of note also an updated CBC a metabolic panel is pending for later this week to keep an eye on his white count and renal function which appears to be relatively stable he also has slightly low sodium of 133 on most recent lab will await updated labs.  Clinically he appears to be a bit stronger although again has a very fragile status with his numerous  comorbidities   [id number]-of note greater than 25 minutes spent assessing patient-discussing patient's concerns at bedside as well as with nursing staff and Dr. Sid Falcon than 50% of time spent coordinating plan of care with input as noted above.

## 2017-01-27 ENCOUNTER — Encounter (HOSPITAL_COMMUNITY)
Admission: RE | Admit: 2017-01-27 | Discharge: 2017-01-27 | Disposition: A | Discharge status: Home / Self Care | Payer: Medicaid Other | Source: Skilled Nursing Facility | Attending: Internal Medicine | Admitting: Internal Medicine

## 2017-01-27 DIAGNOSIS — E118 Type 2 diabetes mellitus with unspecified complications: Secondary | ICD-10-CM | POA: Insufficient documentation

## 2017-01-27 DIAGNOSIS — G009 Bacterial meningitis, unspecified: Secondary | ICD-10-CM | POA: Insufficient documentation

## 2017-01-27 DIAGNOSIS — M818 Other osteoporosis without current pathological fracture: Secondary | ICD-10-CM | POA: Insufficient documentation

## 2017-01-27 DIAGNOSIS — K219 Gastro-esophageal reflux disease without esophagitis: Secondary | ICD-10-CM | POA: Insufficient documentation

## 2017-01-27 DIAGNOSIS — B20 Human immunodeficiency virus [HIV] disease: Secondary | ICD-10-CM | POA: Insufficient documentation

## 2017-01-27 DIAGNOSIS — M6281 Muscle weakness (generalized): Secondary | ICD-10-CM | POA: Insufficient documentation

## 2017-01-27 NOTE — Progress Notes (Signed)
This encounter was created in error - please disregard.

## 2017-01-31 ENCOUNTER — Emergency Department (HOSPITAL_COMMUNITY)
Admission: EM | Admit: 2017-01-31 | Discharge: 2017-01-31 | Disposition: A | Discharge status: Home / Self Care | Payer: Medicaid Other | Attending: Emergency Medicine | Admitting: Emergency Medicine

## 2017-01-31 ENCOUNTER — Encounter (HOSPITAL_COMMUNITY)
Admission: RE | Admit: 2017-01-31 | Discharge: 2017-01-31 | Disposition: A | Discharge status: Home / Self Care | Payer: Medicaid Other | Source: Skilled Nursing Facility | Attending: Internal Medicine | Admitting: Internal Medicine

## 2017-01-31 ENCOUNTER — Non-Acute Institutional Stay (SKILLED_NURSING_FACILITY): Payer: Medicaid Other | Admitting: Internal Medicine

## 2017-01-31 ENCOUNTER — Encounter: Payer: Self-pay | Admitting: Internal Medicine

## 2017-01-31 ENCOUNTER — Inpatient Hospital Stay
Admission: RE | Admit: 2017-01-31 | Discharge: 2017-02-06 | Disposition: E | Payer: Medicaid Other | Source: Ambulatory Visit | Attending: Internal Medicine | Admitting: Internal Medicine

## 2017-01-31 ENCOUNTER — Encounter (HOSPITAL_COMMUNITY): Payer: Self-pay | Admitting: Emergency Medicine

## 2017-01-31 DIAGNOSIS — Z21 Asymptomatic human immunodeficiency virus [HIV] infection status: Secondary | ICD-10-CM | POA: Insufficient documentation

## 2017-01-31 DIAGNOSIS — E118 Type 2 diabetes mellitus with unspecified complications: Secondary | ICD-10-CM | POA: Insufficient documentation

## 2017-01-31 DIAGNOSIS — K219 Gastro-esophageal reflux disease without esophagitis: Secondary | ICD-10-CM | POA: Insufficient documentation

## 2017-01-31 DIAGNOSIS — R627 Adult failure to thrive: Secondary | ICD-10-CM | POA: Insufficient documentation

## 2017-01-31 DIAGNOSIS — N289 Disorder of kidney and ureter, unspecified: Secondary | ICD-10-CM | POA: Diagnosis not present

## 2017-01-31 DIAGNOSIS — F17200 Nicotine dependence, unspecified, uncomplicated: Secondary | ICD-10-CM | POA: Diagnosis not present

## 2017-01-31 DIAGNOSIS — B20 Human immunodeficiency virus [HIV] disease: Secondary | ICD-10-CM | POA: Insufficient documentation

## 2017-01-31 DIAGNOSIS — J449 Chronic obstructive pulmonary disease, unspecified: Secondary | ICD-10-CM | POA: Insufficient documentation

## 2017-01-31 DIAGNOSIS — Z7982 Long term (current) use of aspirin: Secondary | ICD-10-CM | POA: Diagnosis not present

## 2017-01-31 DIAGNOSIS — M818 Other osteoporosis without current pathological fracture: Secondary | ICD-10-CM | POA: Insufficient documentation

## 2017-01-31 DIAGNOSIS — Z79899 Other long term (current) drug therapy: Secondary | ICD-10-CM | POA: Diagnosis not present

## 2017-01-31 DIAGNOSIS — G009 Bacterial meningitis, unspecified: Secondary | ICD-10-CM | POA: Insufficient documentation

## 2017-01-31 DIAGNOSIS — M6281 Muscle weakness (generalized): Secondary | ICD-10-CM | POA: Insufficient documentation

## 2017-01-31 LAB — CBC WITH DIFFERENTIAL/PLATELET
BASOS PCT: 1 %
Basophils Absolute: 0.1 10*3/uL (ref 0.0–0.1)
EOS ABS: 1.5 10*3/uL — AB (ref 0.0–0.7)
EOS PCT: 12 %
HCT: 34 % — ABNORMAL LOW (ref 39.0–52.0)
Hemoglobin: 10.8 g/dL — ABNORMAL LOW (ref 13.0–17.0)
LYMPHS PCT: 27 %
Lymphs Abs: 3.2 10*3/uL (ref 0.7–4.0)
MCH: 29 pg (ref 26.0–34.0)
MCHC: 31.8 g/dL (ref 30.0–36.0)
MCV: 91.2 fL (ref 78.0–100.0)
Monocytes Absolute: 1.3 10*3/uL — ABNORMAL HIGH (ref 0.1–1.0)
Monocytes Relative: 11 %
NEUTROS ABS: 6 10*3/uL (ref 1.7–7.7)
Neutrophils Relative %: 49 %
PLATELETS: 321 10*3/uL (ref 150–400)
RBC: 3.73 MIL/uL — AB (ref 4.22–5.81)
RDW: 17.8 % — ABNORMAL HIGH (ref 11.5–15.5)
WBC: 12.1 10*3/uL — AB (ref 4.0–10.5)

## 2017-01-31 LAB — BASIC METABOLIC PANEL
Anion gap: 11 (ref 5–15)
BUN: 49 mg/dL — AB (ref 6–20)
CO2: 27 mmol/L (ref 22–32)
CREATININE: 2.5 mg/dL — AB (ref 0.61–1.24)
Calcium: 10.8 mg/dL — ABNORMAL HIGH (ref 8.9–10.3)
Chloride: 96 mmol/L — ABNORMAL LOW (ref 101–111)
GFR, EST AFRICAN AMERICAN: 33 mL/min — AB (ref >60)
GFR, EST NON AFRICAN AMERICAN: 28 mL/min — AB (ref >60)
Glucose, Bld: 118 mg/dL — ABNORMAL HIGH (ref 65–99)
POTASSIUM: 4.4 mmol/L (ref 3.5–5.1)
SODIUM: 134 mmol/L — AB (ref 135–145)

## 2017-01-31 LAB — ACID FAST CULTURE WITH REFLEXED SENSITIVITIES (MYCOBACTERIA): Acid Fast Culture: NEGATIVE

## 2017-01-31 NOTE — ED Notes (Signed)
[facility]

## 2017-01-31 NOTE — ED Provider Notes (Signed)
[facility]-EMERGENCY DEPT Provider Note   CSN: [id number] Arrival date & time: 2017-02-20  1447     History   Chief Complaint Chief Complaint  Patient presents with  . Failure To Thrive    HPI Lawrence Kane is a 51 y.o. male.  Level V caveat for serious illness. Patient with multiple comorbidities presents to the ER with elevated creatinine and failure to thrive. He has multiple serious and terminal illnesses well documented in his chart.  He is a do not resuscitate. Further history unable to be obtained.      Past Medical History:  Diagnosis Date  . COPD (chronic obstructive pulmonary disease) (HCC)   . Genital warts   . HIV (human immunodeficiency virus infection) (HCC)   . Osteoporosis     Patient Active Problem List   Diagnosis Date Noted  . Dysphagia   . Adjustment disorder with mixed anxiety and depressed mood 12/30/2016  . Goals of care, counseling/discussion   . Palliative care encounter   . Respiratory distress   . Agitation   . Hypernatremia   . Leukocytosis   . Acute blood loss anemia   . Pain   . Acute respiratory failure with hypoxia (HCC)   . Tracheostomy status (HCC)   . Hypoxemia   . Muscle weakness (generalized)   . Ileus (HCC)   . Abdominal distention   . Transverse myelitis (HCC)   . Encounter for orogastric (OG) tube placement   . Somnolence   . Paraplegia (HCC)   . Acute pulmonary edema (HCC)   . Neurosyphilis   . Herpesviral meningitis   . Cerebral embolism with cerebral infarction 12/08/2016  . Endotracheally intubated   . Meningoencephalitis   . Acute encephalopathy   . Acute respiratory failure (HCC) 12/02/2016  . Altered mental status   . COPD (chronic obstructive pulmonary disease) (HCC) 12/01/2016  . Substance abuse 12/01/2016  . Genital warts 12/01/2016  . HIV (human immunodeficiency virus infection) (HCC) 12/01/2016  . AIDS (acquired immune deficiency syndrome) (HCC) 12/01/2016  . Noncompliance 12/01/2016  . Bacterial  meningitis 12/01/2016  . Hypertensive urgency 12/01/2016  . Septic shock (HCC) 12/01/2016  . Transaminitis 12/01/2016  . Cocaine abuse 12/01/2016  . Open wound 12/01/2016    Past Surgical History:  Procedure Laterality Date  . PROSTATE SURGERY  11/17/2016       Home Medications    Prior to Admission medications   Medication Sig Start Date End Date Taking? Authorizing Provider  aspirin 325 MG tablet Take 1 tablet (325 mg total) by mouth daily. 12/29/16   Richarda Overlie, MD  Balsam [place]-Castor Oil [facility]) OINT Apply to left buttocks and sacrum q shift & prn    Historical Provider, MD  clonazePAM (KLONOPIN) 1 MG tablet Take 1 tablet (1 mg total) by mouth 3 (three) times daily as needed for anxiety. 01/04/17   Dron Jaynie Collins, MD  darunavir-cobicistat (PREZCOBIX) 800-150 MG tablet Take 1 tablet by mouth daily with breakfast. Swallow whole. Do NOT crush, break or chew tablets. Take with food. 12/29/16   Richarda Overlie, MD  diphenhydrAMINE (SOMINEX) 25 MG tablet Take 25 mg by mouth every 6 (six) hours as needed for itching.    Historical Provider, MD  docusate (COLACE) 50 MG/5ML liquid Take 10 mLs (100 mg total) by mouth 2 (two) times daily as needed for mild constipation. 12/29/16   Richarda Overlie, MD  dolutegravir (TIVICAY) 50 MG tablet Take 1 tablet (50 mg total) by mouth daily. 12/29/16   Germain Osgood  Abrol, MD  emtricitabine-tenofovir AF (DESCOVY) 200-25 MG tablet Take 1 tablet by mouth daily. 12/29/16   Richarda Overlie, MD  fentaNYL (DURAGESIC - DOSED MCG/HR) 25 MCG/HR patch Place 1 patch (25 mcg total) onto the skin every 3 (three) days. 01/13/17   Tiffany L Reed, DO  fluconazole (DIFLUCAN) 200 MG tablet Take 1 tablet (200 mg total) by mouth daily. 12/29/16   Richarda Overlie, MD  gabapentin (NEURONTIN) 600 MG tablet Take 600 mg by mouth 3 (three) times daily.    Historical Provider, MD  guaiFENesin-dextromethorphan (ROBITUSSIN DM) 100-10 MG/5ML syrup Take 5 mLs by mouth every 4 (four) hours as needed for  cough. 12/29/16   Richarda Overlie, MD  insulin aspart (NOVOLOG) 100 UNIT/ML injection CBG < 70: implement hypoglycemia protocol  CBG 70 - 120: 0 units  CBG 121 - 150: 2 units  CBG 151 - 200: 3 units  CBG 201 - 250: 5 units  CBG 251 - 300: 7 units  CBG 301 - 350: 10 units  CBG 351 - 400: 12 units  CBG > 400 call MD and obtain STAT lab verification 12/29/16   Richarda Overlie, MD  ipratropium-albuterol (DUONEB) 0.5-2.5 (3) MG/3ML SOLN Take 3 mLs by nebulization every 4 (four) hours as needed (shortness of breathe).    Historical Provider, MD  Oxycodone HCl 10 MG TABS Take 10 mg by mouth every 4 (four) hours as needed.    Historical Provider, MD  pantoprazole sodium (PROTONIX) 40 mg/20 mL PACK Take 20 mLs (40 mg total) by mouth daily. 12/29/16   Richarda Overlie, MD  polyvinyl alcohol (LIQUIFILM TEARS) 1.4 % ophthalmic solution Place 1 drop into both eyes daily as needed for dry eyes.    Historical Provider, MD  sulfamethoxazole-trimethoprim (BACTRIM,SEPTRA) 200-40 MG/5ML suspension Take 20 mLs by mouth daily. 12/30/16   Richarda Overlie, MD    Family History History reviewed. No pertinent family history.  Social History Social History  Substance Use Topics  . Smoking status: Current Every Day Smoker  . Smokeless tobacco: Never Used  . Alcohol use 0.6 oz/week    1 Cans of beer per week     Allergies   Iodine; Ketorolac; and Tylenol [acetaminophen]   Review of Systems Review of Systems  Reason unable to perform ROS: Serious illness.     Physical Exam Updated Vital Signs BP 130/89 (BP Location: Left Arm)   Pulse 89   Temp 97.5 F (36.4 C) (Oral)   Resp (!) 22   Ht 6\' 2"  (1.88 m)   Wt 180 lb (81.6 kg)   SpO2 94%   BMI 23.11 kg/m   Physical Exam  Constitutional:  Mumbles, looks dehydrated  HENT:  Head: Normocephalic and atraumatic.  Eyes: Conjunctivae are normal.  Neck: Neck supple.  Cardiovascular: Normal rate and regular rhythm.   Pulmonary/Chest: Effort normal and breath sounds  normal.  Abdominal: Soft. Bowel sounds are normal.  Musculoskeletal:  unable  Neurological:  unable  Skin: Skin is warm.  Psychiatric:  unable  Nursing note and vitals reviewed.    ED Treatments / Results  Labs (all labs ordered are listed, but only abnormal results are displayed) Labs Reviewed - No data to display  EKG  EKG Interpretation None       Radiology No results found.  Procedures Procedures (including critical care time)  Medications Ordered in ED Medications - No data to display   Initial Impression / Assessment and Plan / ED Course  I have reviewed the triage vital  signs and the nursing notes.  Pertinent labs & imaging results that were available during my care of the patient were reviewed by me and considered in my medical decision making (see chart for details).     I spoke with Bjorn Los[facility]honda [facility] the physician assistant at [phone].  We agreed that patient is in a "comfort care only" mode. This can be accomplished at the nursing home with oral hydration or IV hydration.  Do not think hospital admission is appropriate.  Patient is already in the nursing home.  I attempted to obtain a palliative care consult.  Nurse practitioner stated she would do a palliative consult tomorrow.  I do not think there is a clinical benefit to inpatient admission.  Final Clinical Impressions(s) / ED Diagnoses   Final diagnoses:  Failure to thrive in adult    New Prescriptions New Prescriptions   No medications on file     Donnetta HutchingBrian Cashe Gatt, MD 02/01/17 1431

## 2017-01-31 NOTE — ED Triage Notes (Signed)
Pt from [facility]. Pt sent over due to acute renal failure and failure to thrive. [facility] reported possible palliative consult as well.

## 2017-01-31 NOTE — Progress Notes (Signed)
Location:   [facility] Room Number: 151/P Place of Service:  SNF (31) Provider:  [place] Lewis  No PCP Per Patient  Patient Care Team: No Pcp Per Patient as PCP - General (General Practice)  Extended Emergency Contact Information Primary Emergency Contact: ?,[facility] Address: [address]          [place], [place] [address] [name] Phone: [phone] Relation: None Secondary Emergency Contact: Herbert Moors [place] of [place] Phone: [phone] Mobile Phone: [phone] Relation: Mother  Code Status:  DNR Goals of care: Advanced Directive information Advanced Directives 01/23/2017  Does Patient Have a Medical Advance Directive? Yes  Type of Advance Directive Out of facility DNR (pink MOST or yellow form)  Does patient want to make changes to medical advance directive? No - Patient declined  Would patient like information on creating a medical advance directive? No - Patient declined   Chief complaint-acute visit secondary to abnormal labs--with elevated creatinine and BUN    HPI:  Pt is a 51 y.o. male seen today for an acute visit for abnormal labs with a significant rise in his BUN and creatinine.  Patient has a very complicated ankle history including history of HIV-substance abuse-medical noncompliance.  InitiallyHe presented to hospital with septic shock and altered mental status due to bacterial meningitis.  He did require intubation and was started on broad-spectrum robotics as well as acyclovir.  Lumbar puncture showed a white count high with predominance of eosinophils--question parasitic intervention.  MRI showed small infarcts in the cerebellum and cortex due to vasospasm or embolic disease.  Wvasculopathy was suggested by neurology but pattern on MRI did not really demonstrate this.  MRA suggested fibromuscular dysplasia.  Infarction felt to be secondary to vasculitis and vasospasm  secondary to meningeal encephalitis.  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  He also completed course for ESBL Klebsiella.  He is on Bactrim for PCP prophylaxis.  Per staff patient appears to be weaker than he appeared last week-labs were ordered for later last week but patient does have a history of refusing lab draws-however staff was successful this morning  obtaining labs and it is concerning for a creatinine of 2.5 and a BUN of 49---11 previous creatinine creatinine was 1.25 and BUN was 16 and this has been relatively his baseline since we've been following him here.  His white count is also elevated at 12.1 this appears to be secondary to elevated monocytes and Eosinophils .  His vital signs actually appear to be stable today at times he will have a low-grade elevated temperature.  He does appear weaker today however pain has been in issue and  last week we increased his when necessary oxycodone up to 10 mg every 4 hours severe pain he is also on a Duragesic patch 25 g in addition to Neurontin 600 mg 3 times a day.  Pain is basically in his lower extremities with a history of paraplegia.  He does have an indwelling Foley catheter secondary to concerns of previous urinary retention it appears to have a moderate amount of blood-tinged urine today per nursing staff did feel the blood at times comes from possible trauma to the catheter site.  Patient is not speaking much today appears weaker but is not really complaining of any fever chills shortness of breath chest congestion.  Apparently his appetite over the last few days has decreased however and we actually have a psych consult  pending secondary to concerns of depression.     Past Medical History:  Diagnosis Date  . COPD (chronic obstructive pulmonary disease) ([place])   . Genital warts   . HIV (human immunodeficiency virus infection) ([place])   . Osteoporosis    Past Surgical History:    Procedure Laterality Date  . PROSTATE SURGERY  11/17/2016    Allergies  Allergen Reactions  . Iodine   . Ketorolac     unknown  . Tylenol [Acetaminophen]     unknown    Current Facility-Administered Medications on File Prior to Visit  Medication Dose Route Frequency Provider Last Rate Last Dose  . chlorhexidine gluconate (MEDLINE KIT) (PERIDEX) 0.12 % solution 15 mL  15 mL Mouth Rinse BID Rochester, MD      . MEDLINE mouth rinse  15 mL Mouth Rinse 10 times per day Rush Landmark, MD       Current Outpatient Prescriptions on File Prior to Visit  Medication Sig Dispense Refill  . aspirin 325 MG tablet Take 1 tablet (325 mg total) by mouth daily. 30 tablet 0  . Balsam [place]-Castor Oil (VENELEX) OINT Apply to left buttocks and sacrum q shift & prn    . clonazePAM (KLONOPIN) 1 MG tablet Take 1 tablet (1 mg total) by mouth 3 (three) times daily as needed for anxiety. 12 tablet 0  . darunavir-cobicistat (PREZCOBIX) 800-150 MG tablet Take 1 tablet by mouth daily with breakfast. Swallow whole. Do NOT crush, break or chew tablets. Take with food. 30 tablet 0  . diphenhydrAMINE (SOMINEX) 25 MG tablet Take 25 mg by mouth every 6 (six) hours as needed for itching.    . docusate (COLACE) 50 MG/5ML liquid Take 10 mLs (100 mg total) by mouth 2 (two) times daily as needed for mild constipation. 100 mL 0  . dolutegravir (TIVICAY) 50 MG tablet Take 1 tablet (50 mg total) by mouth daily. 30 tablet 1  . emtricitabine-tenofovir AF (DESCOVY) 200-25 MG tablet Take 1 tablet by mouth daily. 30 tablet 0  . fentaNYL (DURAGESIC - DOSED MCG/HR) 25 MCG/HR patch Place 1 patch (25 mcg total) onto the skin every 3 (three) days. 10 patch 0  . fluconazole (DIFLUCAN) 200 MG tablet Take 1 tablet (200 mg total) by mouth daily. 30 tablet 0  . gabapentin (NEURONTIN) 600 MG tablet Take 600 mg by mouth 3 (three) times daily.    Marland Kitchen guaiFENesin-dextromethorphan (ROBITUSSIN DM) 100-10 MG/5ML syrup Take 5 mLs by  mouth every 4 (four) hours as needed for cough. 118 mL 0  . insulin aspart (NOVOLOG) 100 UNIT/ML injection CBG < 70: implement hypoglycemia protocol  CBG 70 - 120: 0 units  CBG 121 - 150: 2 units  CBG 151 - 200: 3 units  CBG 201 - 250: 5 units  CBG 251 - 300: 7 units  CBG 301 - 350: 10 units  CBG 351 - 400: 12 units  CBG > 400 call MD and obtain STAT lab verification 10 mL 11  . ipratropium-albuterol (DUONEB) 0.5-2.5 (3) MG/3ML SOLN Take 3 mLs by nebulization every 4 (four) hours as needed (shortness of breathe).    . pantoprazole sodium (PROTONIX) 40 mg/20 mL PACK Take 20 mLs (40 mg total) by mouth daily. 30 each 1  . polyvinyl alcohol (LIQUIFILM TEARS) 1.4 % ophthalmic solution Place 1 drop into both eyes daily as needed for dry eyes.    Marland Kitchen sulfamethoxazole-trimethoprim (BACTRIM,SEPTRA) 200-40 MG/5ML suspension Take 20 mLs by mouth  daily. 100 mL 2     Review of Systems This is limited secondary patient not speaking much today.  General he is not complaining of fever chills.  Skin does have lesions morning his genital area that has been followed by wound care nursing apparently these have been somewhat of a challenge areas currently covered.  Respiratory does not overtly complain of shortness of breath or cough.  Cardiac does not complain of chest pain does not have significant lower extremity edema.  GI apparently his appetite has decreased he does not specifically complain however of abdominal pain or nausea or vomiting.  GU does have an indwelling Foley catheter with history of urinary retention does not specifically complain of  pain however.  Muscle skeletal according to nursing staff continues to complain of lower extremity discomfort.  Neurologic does not complain of dizziness headache.  Psych again there is some concern for increased depression from nursing staff  There is no immunization history on file for this patient. Pertinent  Health Maintenance Due  Topic Date  Due  . FOOT EXAM  12/06/1975  . OPHTHALMOLOGY EXAM  12/06/1975  . URINE MICROALBUMIN  12/06/1975  . COLONOSCOPY  12/06/2015  . INFLUENZA VACCINE  10/08/2017 (Originally 06/08/2016)  . HEMOGLOBIN A1C  06/02/2017   No flowsheet data found. Functional Status Survey:    Temperature is 97.3 pulse initially was 1010 however when nursing staff administered pain meds it did come down into the high 80s respirations 20 blood pressure 121/73 O2 saturation is 94% on room air Physical Exam In general this is a frail appearing middle-aged male who appears to be weaker than when I saw him last week he does not appear to be in distress but is not talking as much appears more frail.  His skin is warm he does have some slight diaphoresis. Does have a history apparently of some genital lesions are followed by wound care he does have some covering over his left inner thigh apparently this is a skin excoriation  Mucous membranes appear somewhat dry.  Chest is poor respiratory effort but could not really appreciate any overt congestion no labored breathing.  Heart distant heart sounds from what I could hear was regular rate and rhythm he does not have significant lower extremity edema.  His abdomen is soft does not appear to be acutely tender there are positive bowel sounds.  GU he has an indwelling Foley catheter draining blood-tinged amber colored urine could not really appreciate significant suprapubic tenderness.  Musculoskeletal continues to have frailty with bilateral low extremity paralysis and weakness-with diffuse muscle wasting.  Continues to have some tenderness to palpation of his legs.  Neurologic he is alert cranial nerves appear to be grossly tacky does speech but he is talking and somewhat of a whispered today.  Psych he appears alert and oriented but not speaking a lot is a subdued somewhat depressed appearance Labs reviewed:  Recent Labs  12/12/16 0319 12/13/16 0219 12/15/16 0439   12/19/16 0910  01/07/17 0700 01/20/17 0700 02/04/2017 0730  NA 133* 136 138  < > 149*  < > 133* 133* 134*  K 5.2* 4.1 3.5  < > 3.5  < > 4.6 4.4 4.4  CL 90* 97* 97*  < > 113*  < > 95* 96* 96*  CO2 34* 35* 30  < > 27  < > _0 GLUCOSE 245* 223* 219*  < > 217*  < > 140* 103* 118*  BUN 23* 24* 28*  < >  51*  < > 23* 16 49*  CREATININE 0.83 0.82 0.79  < > 1.16  < > 1.31* 1.25* 2.50*  CALCIUM 8.1* 8.4* 9.1  < > 9.3  < > 8.7* 9.2 10.8*  MG 1.7 2.0  --   --  2.5*  --   --   --   --   PHOS 3.2 2.6 3.8  --   --   --   --   --   --   < > = values in this interval not displayed.  Recent Labs  01/02/17 0600 01/07/17 0700 01/20/17 0700  AST 47* 34 30  ALT 62 53 32  ALKPHOS 81 61 51  BILITOT 0.6 0.5 0.4  PROT 6.6 7.2 7.6  ALBUMIN 2.0* 2.4* 2.4*    Recent Labs  01/07/17 0700 01/20/17 0700 01/25/2017 0730  WBC 8.7 8.4 12.1*  NEUTROABS 6.4 4.3 6.0  HGB 11.5* 10.9* 10.8*  HCT 34.9* 33.9* 34.0*  MCV 93.8 90.9 91.2  PLT 203 380 321   Lab Results  Component Value Date   TSH 0.982 12/19/2016   Lab Results  Component Value Date   HGBA1C 6.6 (H) 12/03/2016   Lab Results  Component Value Date   CHOL 141 12/09/2016   HDL 16 (L) 12/09/2016   LDLCALC 93 12/09/2016   TRIG 217 (H) 12/12/2016   CHOLHDL 8.8 12/09/2016    Significant Diagnostic Results in last 30 days:  Dg Chest 2 View  Result Date: 01/07/2017 CLINICAL DATA:  Recurrent pneumonia, followup exam EXAM: CHEST  2 VIEW COMPARISON:  12/26/2016 FINDINGS: Cardiac shadow is stable. Previously seen infiltrate in the left lung has improved significantly although residual is noted in the lower lobe. No effusion is seen. No new focal infiltrate is noted. No bony abnormality is seen. IMPRESSION: Improved but persistent left lower lobe pneumonia. Continued follow-up is recommended. Electronically Signed   By: Inez Catalina M.D.   On: 01/07/2017 12:51   Dg Op Swallowing Func-medicare/speech Path  Result Date: 01/18/2017 [place], [place], [address] Phone: [phone]   Fax:  [phone] Modified Barium Swallow Patient Details Name: ROXAS CLYMER MRN: [id number] Date of Birth: [birth year] No Data Recorded Encounter Date: 01/17/2017   End of Session - 01/17/17 1942   Visit Number 1  Number of Visits 1  Authorization Type Medicaid  SLP Start Time 1440  SLP Stop Time  1513  SLP Time Calculation (min) 33 min  Activity Tolerance Patient tolerated treatment well  Past Medical History: Diagnosis Date . COPD (chronic obstructive pulmonary disease) ([place])  . Genital warts  . HIV (human immunodeficiency virus infection) ([place])  . Osteoporosis  Past Surgical History: Procedure Laterality Date . PROSTATE SURGERY  11/17/2016 There were no vitals filed for this visit.   Subjective Assessment - 01/17/17 1936   Subjective Pt seen upright in [place] chair for MBSS  Special Tests MBSS  Currently in Pain? No/denies    General - 01/17/17 [place]    General Information  HPI Rickardo Pechacek is a 51 year old man who has a history of HIV, substance abuse, medical noncompliance. He was referred for MBSS by Dr. Linton Ham. Pt with previous MBSS at [facility] in February with recommendation for pudding thick liquids and puree. He presented to hospital with septic shock and altered mental status due to bacterial meningitis. He was intubated and started on broad-spectrum antibiotics as well as acyclovir for scrotal lesions. He will underwent a  lumbar punctures that showed a white count high with predominance of eosinophils question parasitic infection. Cytology negative for malignant cells or parasites. MRI was done that showed small infarcts in the cerebellum and cortex due to vasospasm or embolic disease. 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 MRAsuggested fibromuscular dysplasia. His infarcts were felt to be secondary to vasculitis and vasospasm  secondary to meningoencephalitis. MRI of total spine was negative for abscess hematoma. 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 He was treated for a complicated UTI ESBL Klebsiella and a tracheostomy placed on 2/5 although that since been removed.. The patient completed a 21 day course of acyclovir. Now he is on Bactrim for PCP prophylaxis. Pt was at [facility] from 12/01/16 through 01/04/17.  Type of Study MBS-Modified Barium Swallow Study  Previous Swallow Assessment MBS 12/27/2016 and 12/30/2016  Diet Prior to this Study Dysphagia 1 (puree);Pudding-thick liquids  Temperature Spikes Noted No  Respiratory Status Room air  Behavior/Cognition Alert;Cooperative;Pleasant mood  Oral Cavity Assessment Within Functional Limits  Oral Care Completed by SLP No  Oral Cavity - Dentition Adequate natural dentition  Vision Functional for self feeding  Self-Feeding Abilities Able to feed self  Patient Positioning Upright in chair  Baseline Vocal Quality Low vocal intensity  Volitional Cough Strong  Volitional Swallow Able to elicit  Anatomy Within functional limits  Pharyngeal Secretions Not observed secondary MBS    Oral Preparation/Oral Phase - 01/17/17 1938    Oral Preparation/Oral Phase  Oral Phase Impaired   Electrical stimulation - Oral Phase  Was Electrical Stimulation Used No    Pharyngeal Phase - 01/17/17 1939    Pharyngeal Phase  Pharyngeal Phase Impaired   Pharyngeal - Nectar  Pharyngeal- Nectar Cup Swallow initiation at pyriform sinus;Reduced tongue base retraction;Reduced epiglottic inversion;Pharyngeal residue - valleculae;Pharyngeal residue - pyriform trace/mild residuals; repeat swallow effective   Pharyngeal - Thin  Pharyngeal- Thin Teaspoon Swallow initiation at vallecula;Reduced epiglottic inversion;Reduced tongue base retraction Pt swallowed 3x for one tsp  Pharyngeal- Thin Cup Swallow initiation at vallecula;Swallow initiation at pyriform sinus;Reduced epiglottic inversion;Reduced  tongue base retraction;Penetration/Aspiration during swallow;Pharyngeal residue - valleculae;Pharyngeal residue - pyriform;Pharyngeal residue - cp segment  Pharyngeal Material does not enter airway;Material enters airway, remains ABOVE vocal cords then ejected out  Pharyngeal- Thin Straw Swallow initiation at pyriform sinus;Reduced epiglottic inversion;Reduced tongue base retraction;Penetration/Aspiration during swallow;Pharyngeal residue - cp segment  Pharyngeal Material does not enter airway;Material enters airway, remains ABOVE vocal cords then ejected out   Pharyngeal - Solids  Pharyngeal- Puree Swallow initiation at vallecula;Reduced epiglottic inversion;Reduced tongue base retraction;Pharyngeal residue - valleculae;Pharyngeal residue - pyriform;Pharyngeal residue - cp segment  Pharyngeal- Mechanical Soft Swallow initiation at vallecula;Reduced epiglottic inversion;Reduced tongue base retraction;Pharyngeal residue - valleculae  Pharyngeal- Pill Swallow initiation at vallecula;Reduced epiglottic inversion;Reduced tongue base retraction   Pharyngeal Phase - Comment  Pharyngeal Comment Pt noted to have trace, silent aspiration of thins when taking mixed consistencies (pill and thin; thin and crackers); cued cough was effective in clearing   Electrical Stimulation - Pharyngeal Phase  Was Electrical Stimulation Used No    Cricopharyngeal Phase - 01/17/17 1940    Cervical Esophageal Phase  Cervical Esophageal Phase Impaired   Cervical Esophageal Phase - Thin  Thin Cup Reduced cricopharyngeal relaxation    Plan - 01/17/17 1943   Clinical Impression Statement Pt assessed in the lateral position with barium tinged thin, NTL, puree, mixed consistencies, regular textures, and barium tablet. Pt  presents with mild oral phase dysphagia with delayed oral transit and piecemeal deglutition; mild/moderate pharyngeal phase dysphagia characterized by swallow trigger at the level of the valleculae for small sips thin, puree, and  solids; swallow trigger at the level of the pyriforms with larger sips thin. Pt with decreased tongue base retraction and epiglottic deflection resulting in flash penetration of thins during the swallow and mild vallecular, pyriform, and UES residuals. Repeat swallows are effective in reducing residuals and pt tends to implement independently. Pt noted to have more difficulty swallowing barium tablet with thin (premature spillage of thins and stasis in oral cavity with pill which eventually moved to valleculae and eventually cleared with sequential cup sips thin) and with mixed consistencies (Pt chewing crackers and asked to take a sip of liquid). Although unwitnessed, Pt aspirated trace amount of thins silently as it was noted along anterior tracheal wall post swallow. SLP cued pt to cough and he was successful in clearing aspirated material. Recommend D3/mech soft (due to suspected impulsivity but ok to clinically upgrade per treating SLP) and thin liquids, no straws, avoid mixed consistencies and cue Pt to cough periodically after thins. Treating SLP may decide to continue with NTL during meals for safety and advance as appropriate. PO medications whole in puree or crushed. Continued dysphagia therapy may be indicated to focus on increasing breath support, vocal fold closure, tongue base retraction.   Treatment/Interventions Aspiration precaution training;SLP instruction and feedback;Compensatory strategies;Patient/family education;Diet toleration management by SLP;Pharyngeal strengthening exercises  Potential to Achieve Goals Fair  Potential Considerations Ability to learn/carryover information  Consulted and Agree with Plan of Care Patient  Patient will benefit from skilled therapeutic intervention in order to improve the following deficits and impairments:  Dysphagia, oropharyngeal phase   Recommendations/Treatment - 01/17/17 1941    Swallow Evaluation Recommendations  SLP Diet Recommendations Dysphagia 3  (mechanical soft);Thin  Liquid Administration via Cup  Medication Administration Whole meds with puree  Supervision Patient able to self feed  Compensations Slow rate;Small sips/bites;Multiple dry swallows after each bite/sip  Postural Changes Seated upright at 90 degrees;Remain upright for at least 30 minutes after feeds/meals    Prognosis - 01/17/17 1941    Prognosis  Prognosis for Safe Diet Advancement Fair  Barriers to Reach Goals Cognitive deficits   Individuals Consulted  Consulted and Agree with Results and Recommendations Patient  Report Sent to  Referring physician;Facility (Comment)  Problem List Patient Active Problem List  Diagnosis Date Noted . Dysphagia  . Adjustment disorder with mixed anxiety and depressed mood 12/30/2016 . Goals of care, counseling/discussion  . Palliative care encounter  . Respiratory distress  . Agitation  . Hypernatremia  . Leukocytosis  . Acute blood loss anemia  . Pain  . Acute respiratory failure with hypoxia ([place])  . Tracheostomy status ([place])  . Hypoxemia  . Muscle weakness (generalized)  . Ileus ([place])  . Abdominal distention  . Transverse myelitis ([place])  . Encounter for orogastric (OG) tube placement  . Somnolence  . Paraplegia ([place])  . Acute pulmonary edema ([facility])  . Neurosyphilis  . Herpesviral meningitis  . Cerebral embolism with cerebral infarction 12/08/2016 . Endotracheally intubated  . Meningoencephalitis  . Acute encephalopathy  . Acute respiratory failure ([place]) 12/02/2016 . Altered mental status  . COPD (chronic obstructive pulmonary disease) ([place]) 12/01/2016 . Substance abuse 12/01/2016 . Genital warts 12/01/2016 . HIV (human immunodeficiency virus infection) ([place]) 12/01/2016 . AIDS (acquired immune deficiency syndrome) ([place]) 12/01/2016 . Noncompliance 12/01/2016 . Bacterial meningitis 12/01/2016 . Hypertensive  urgency 12/01/2016 . Septic shock ([place]) 12/01/2016 . Transaminitis 12/01/2016 . Cocaine abuse 12/01/2016 . Open wound 12/01/2016 Thank you, Genene Churn,  [facility] 01/17/2017, 8:39 PM [place], [place], [address] Phone: [phone]   Fax:  [phone] Name: RAJIV PARLATO MRN: [id number] Date of Birth: January 11, 1966 CLINICAL DATA:  HIV, suspected transverse myelitis, dysphagia, history COPD EXAM: MODIFIED BARIUM SWALLOW TECHNIQUE: Different consistencies of barium were administered orally to the patient by the Speech Pathologist. Imaging of the pharynx was performed in the lateral projection. FLUOROSCOPY TIME:  Fluoroscopy Time:  3 minutes 18 seconds Radiation Exposure Index (if provided by the fluoroscopic device): 22.9 mGy Number of Acquired Spot Images: Screen capture of cine series during fluoroscopy COMPARISON:  None. FINDINGS: Thin liquid- spillover of contrast to the piriform sinuses. Flash laryngeal penetration with cup presentation. No aspiration. Mild vallecular and minimal piriform sinus residuals. Flash laryngeal penetration was seen on the second of 3 sequential swallows by straw presentation. Nectar thick liquid- mild spillover of contrast to the piriform sinuses. Vallecular residual. No laryngeal penetration or aspiration. Honey- not evaluated Pure- normal oropharyngeal motion. No laryngeal penetration or aspiration. Vallecular and minimal piriform sinus residuals. Cracker-no laryngeal penetration or aspiration. Vallecular and piriform sinus residuals noted. Pure with cracker- no laryngeal penetration or aspiration. Vallecular and piriform sinuses residuals noted. Barium tablet - patient swallow a 12.5 mm diameter barium tablet, which transiently delayed in the vallecula cells clear by repeat swallow. Small amount of thin barium contrast was identified in the proximal trachea following the barium tablet likely representing unwitnessed aspiration of thin barium. This was cleared with a voluntary cough. IMPRESSION: Swallowing dysfunction as above. Please refer to the  Speech Pathologists report for complete details and recommendations. Electronically Signed   By: Lavonia Dana M.D.   On: 01/17/2017 15:28    Assessment/Plan  Renal insufficiency-creatinine and BUN have risen significantly since last lab-apparently is not eating as well and apparently is not drinking as well either.  -Appears tobe a failure to thrive etiology here with patient's numerous comorbidities-cannot rule out overtly however an acute kidney process-this was discussed with patient and will send him to the ER for expedient evaluation this was discussed with Dr. Lyndel Safe as well.  Vital signs continued to be stable but he appears to be generally declining  [id number]  Addendum I did speak with the ER physician-they feel that he is probably entering the terminal stages of his disease process-do not feel aggressive workup would be in his best interest-patient has expressed to nursing staff on numerous occasions that he is ready to die and has been open to hospice consult-this has been complicated somewhat with his mother living in [place]-andadministrative issues with patient  being able [place] cross state lines  and still receive hospice care  I did discuss patient's status with his mother via phone today along with nursing staff in attendance-his mother does agree with hospice care-we also mentioned comfort care no IVs no hospitalizations and she was in agreement with this as well.  Will order a hospice consult and comfort care as noted above- prognosis continues to be quite poor.  Emphasis on pain control and again hospice would be valuable in helping with this as well.  Marland Kitchen

## 2017-01-31 NOTE — Discharge Instructions (Signed)
Recommend palliative care consultation. Attempt oral hydration. Follow-up with primary care physician.  Intravenous fluids can be administered in the nursing home if felt appropriate

## 2017-02-02 ENCOUNTER — Other Ambulatory Visit: Payer: Self-pay | Admitting: *Deleted

## 2017-02-02 MED ORDER — OXYCODONE HCL 10 MG PO TABS: ORAL_TABLET | ORAL | 0 refills | Status: AC

## 2017-02-06 NOTE — Telephone Encounter (Signed)
[facility] #[phone] Fax: [phone]   

## 2017-02-06 DEATH — deceased

## 2017-05-03 IMAGING — CR DG ABD PORTABLE 1V
1 series · 1 of 1 positions shown · non-contrast
Comparison: December 14, 2016

CLINICAL DATA: Abdominal distention

EXAM:
PORTABLE ABDOMEN - 1 VIEW

[supine ap]
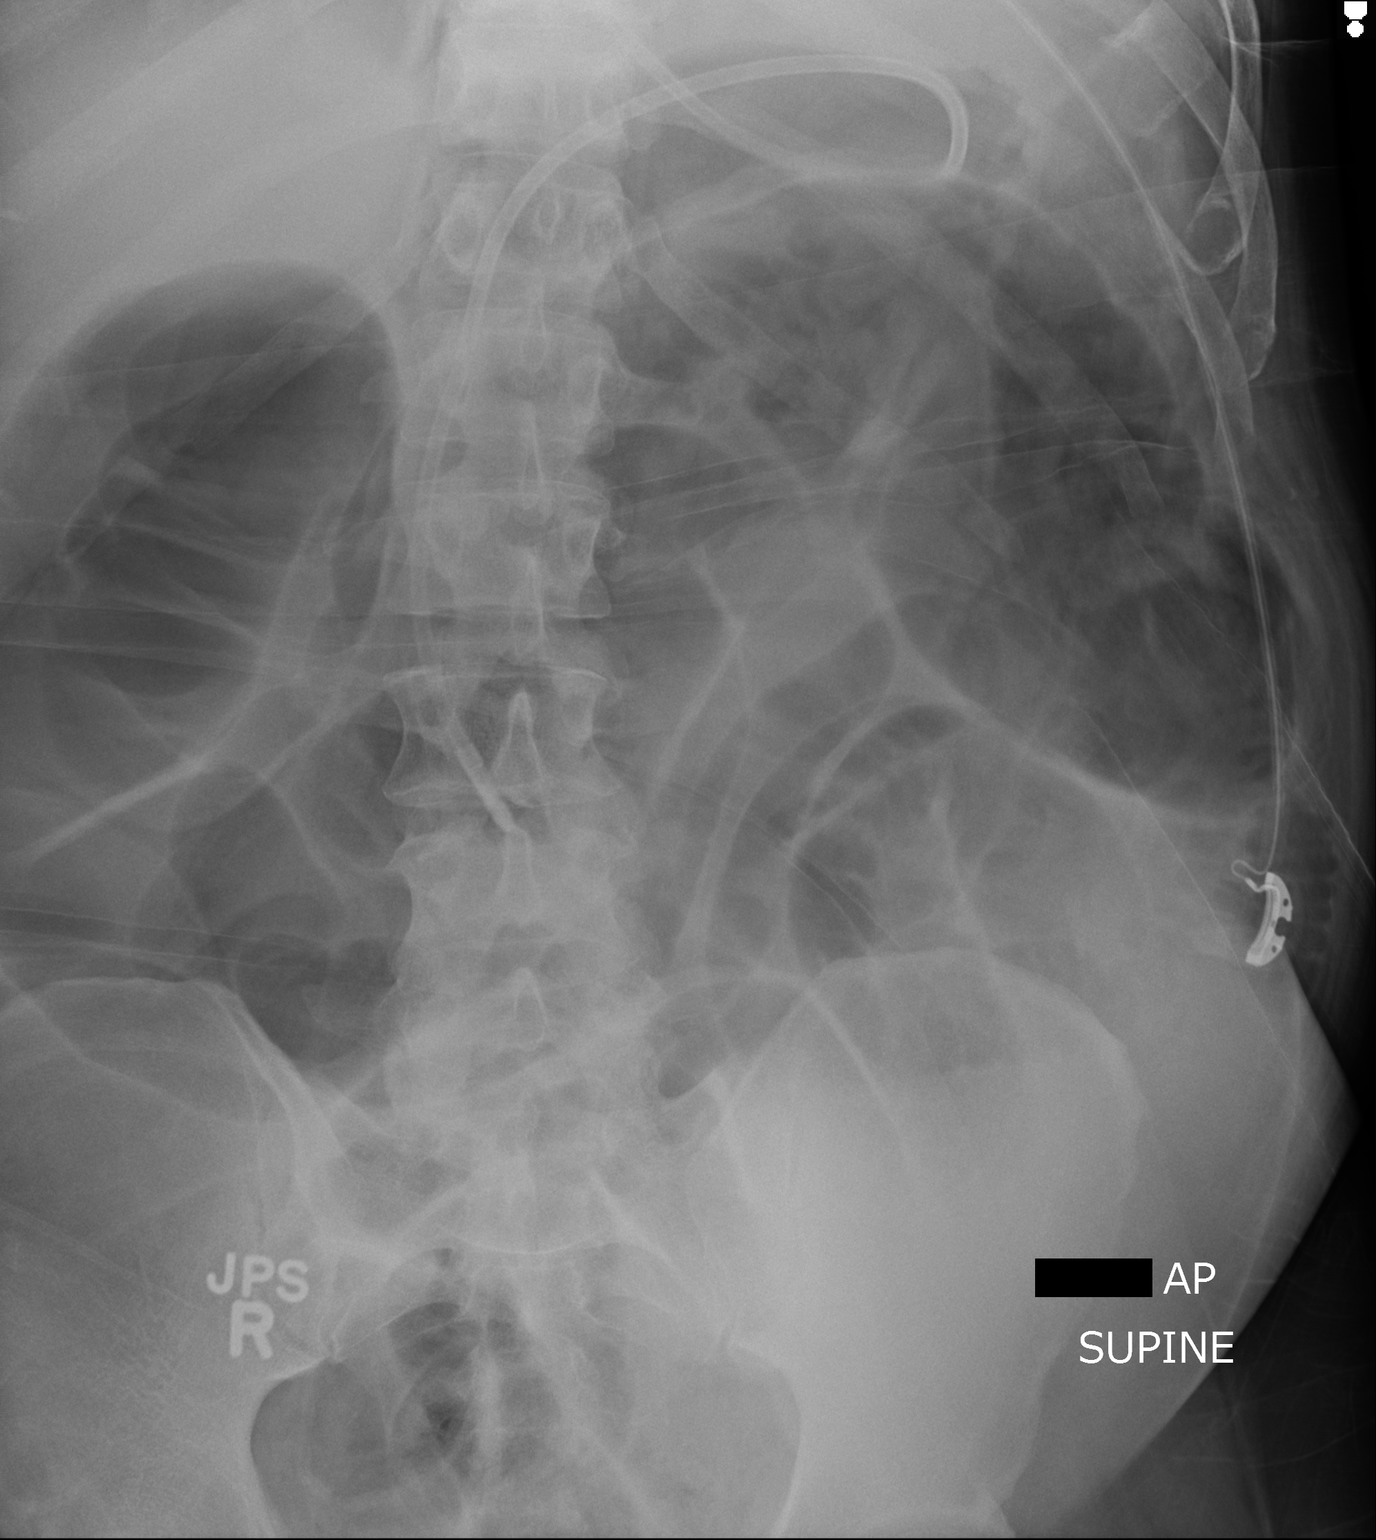

[1 of 1 positions shown; findings below may reference images not displayed]

FINDINGS: Feeding tube tip is in the third portion of the duodenum, unchanged.
There are multiple loops of dilated bowel without bowel wall
thickening. No free air evident.
IMPRESSION: Bowel gas pattern is concerning for ileus or possibly early bowel
obstruction. No demonstrable free air. Feeding tube tip in third
portion duodenum.

## 2017-06-04 IMAGING — RF DG SWALLOWING FUNCTION
1 series · 1 of 1 positions shown · non-contrast
Comparison: None.

CLINICAL DATA: HIV, suspected transverse myelitis, dysphagia,
history COPD

EXAM:
MODIFIED BARIUM SWALLOW
TECHNIQUE: Different consistencies of barium were administered orally to the
patient by the Speech Pathologist. Imaging of the pharynx was
performed in the lateral projection.
FLUOROSCOPY TIME:  Fluoroscopy Time:  3 minutes 18 seconds
Radiation Exposure Index (if provided by the fluoroscopic device):
22.9 mGy
Number of Acquired Spot Images: Screen capture of cine series during
fluoroscopy

[Series 1: cp_standard · 0.25mm/px · 1 of 1 slices shown]
[im 1/1]
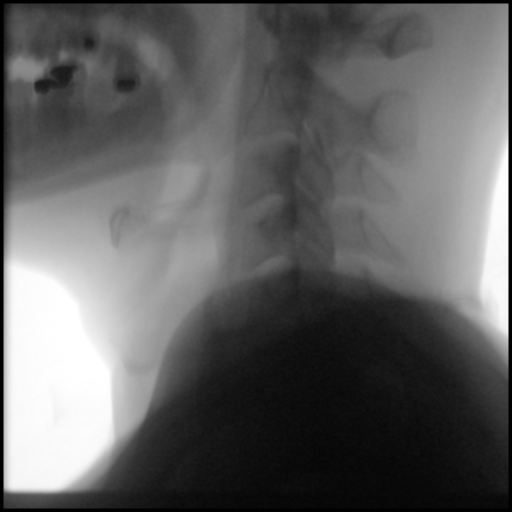

[1 of 1 positions shown; findings below may reference images not displayed]

FINDINGS: Thin liquid- spillover of contrast to the piriform sinuses. Flash
laryngeal penetration with cup presentation. No aspiration. Mild
vallecular and minimal piriform sinus residuals. Flash laryngeal
penetration was seen on the second of 3 sequential swallows by straw
presentation.

Nectar thick liquid- mild spillover of contrast to the piriform
sinuses. Vallecular residual. No laryngeal penetration or
aspiration.

Honey- not evaluated

Cory?Mily normal oropharyngeal motion. No laryngeal penetration or
aspiration. Vallecular and minimal piriform sinus residuals.

Cracker-no laryngeal penetration or aspiration. Vallecular and
piriform sinus residuals noted.

Cory?Herve with cracker- no laryngeal penetration or aspiration.
Vallecular and piriform sinuses residuals noted.

Barium tablet - patient swallow a 12.5 mm diameter barium tablet,
which transiently delayed in the vallecula cells clear by repeat
swallow. Small amount of thin barium contrast was identified in the
proximal trachea following the barium tablet likely representing
unwitnessed aspiration of thin barium. This was cleared with a
voluntary cough.
IMPRESSION: Swallowing dysfunction as above.

Please refer to the Speech Pathologists report for complete details
and recommendations.
